# Patient Record
Sex: Male | Born: 1935 | Race: White | Hispanic: No | Smoking: Never smoker
Health system: Southern US, Community
[De-identification: ages and names within clinical notes are randomized; demographics above are authoritative.]

## PROBLEM LIST (undated history)

## (undated) DIAGNOSIS — F419 Anxiety disorder, unspecified: Secondary | ICD-10-CM

## (undated) DIAGNOSIS — F429 Obsessive-compulsive disorder, unspecified: Secondary | ICD-10-CM

## (undated) DIAGNOSIS — K5730 Diverticulosis of large intestine without perforation or abscess without bleeding: Secondary | ICD-10-CM

## (undated) DIAGNOSIS — R29898 Other symptoms and signs involving the musculoskeletal system: Secondary | ICD-10-CM

## (undated) DIAGNOSIS — K219 Gastro-esophageal reflux disease without esophagitis: Secondary | ICD-10-CM

## (undated) DIAGNOSIS — K449 Diaphragmatic hernia without obstruction or gangrene: Secondary | ICD-10-CM

## (undated) DIAGNOSIS — K635 Polyp of colon: Secondary | ICD-10-CM

## (undated) DIAGNOSIS — K222 Esophageal obstruction: Secondary | ICD-10-CM

## (undated) HISTORY — DX: Anxiety disorder, unspecified: F41.9

## (undated) HISTORY — DX: Gastro-esophageal reflux disease without esophagitis: K21.9

## (undated) HISTORY — DX: Esophageal obstruction: K22.2

## (undated) HISTORY — DX: Polyp of colon: K63.5

## (undated) HISTORY — DX: Diverticulosis of large intestine without perforation or abscess without bleeding: K57.30

## (undated) HISTORY — PX: COLONOSCOPY: SHX174

## (undated) HISTORY — DX: Other symptoms and signs involving the musculoskeletal system: R29.898

## (undated) HISTORY — PX: HEMORRHOID SURGERY: SHX153

## (undated) HISTORY — DX: Obsessive-compulsive disorder, unspecified: F42.9

## (undated) HISTORY — DX: Diaphragmatic hernia without obstruction or gangrene: K44.9

---

## 2003-03-19 DIAGNOSIS — K449 Diaphragmatic hernia without obstruction or gangrene: Secondary | ICD-10-CM | POA: Insufficient documentation

## 2004-12-05 ENCOUNTER — Ambulatory Visit: Payer: Self-pay | Admitting: Gastroenterology

## 2004-12-12 ENCOUNTER — Ambulatory Visit (HOSPITAL_COMMUNITY): Admission: RE | Admit: 2004-12-12 | Discharge: 2004-12-12 | Payer: Self-pay | Admitting: Gastroenterology

## 2004-12-28 ENCOUNTER — Ambulatory Visit: Payer: Self-pay | Admitting: Gastroenterology

## 2007-05-06 ENCOUNTER — Ambulatory Visit: Payer: Self-pay | Admitting: Gastroenterology

## 2007-05-06 LAB — CONVERTED CEMR LAB
Basophils Absolute: 0 10*3/uL (ref 0.0–0.1)
Basophils Relative: 0.6 % (ref 0.0–1.0)
Eosinophils Absolute: 0.3 10*3/uL (ref 0.0–0.6)
Eosinophils Relative: 5.9 % — ABNORMAL HIGH (ref 0.0–5.0)
Ferritin: 281.6 ng/mL (ref 22.0–322.0)
Folate: 11.6 ng/mL
HCT: 42.8 % (ref 39.0–52.0)
Hemoglobin: 14.9 g/dL (ref 13.0–17.0)
Iron: 85 ug/dL (ref 42–165)
Lymphocytes Relative: 36.6 % (ref 12.0–46.0)
MCHC: 34.9 g/dL (ref 30.0–36.0)
MCV: 95.1 fL (ref 78.0–100.0)
Monocytes Absolute: 0.6 10*3/uL (ref 0.2–0.7)
Monocytes Relative: 11 % (ref 3.0–11.0)
Neutro Abs: 2.7 10*3/uL (ref 1.4–7.7)
Neutrophils Relative %: 45.9 % (ref 43.0–77.0)
Platelets: 193 10*3/uL (ref 150–400)
RBC: 4.5 M/uL (ref 4.22–5.81)
RDW: 12.1 % (ref 11.5–14.6)
Saturation Ratios: 31.9 % (ref 20.0–50.0)
Transferrin: 190.4 mg/dL — ABNORMAL LOW (ref >=212.0)
Vitamin B-12: 429 pg/mL (ref 211–911)
WBC: 5.7 10*3/uL (ref 4.5–10.5)

## 2007-05-14 ENCOUNTER — Ambulatory Visit: Payer: Self-pay | Admitting: Gastroenterology

## 2007-05-14 DIAGNOSIS — K5730 Diverticulosis of large intestine without perforation or abscess without bleeding: Secondary | ICD-10-CM | POA: Insufficient documentation

## 2008-11-16 ENCOUNTER — Ambulatory Visit (HOSPITAL_COMMUNITY): Admission: RE | Admit: 2008-11-16 | Discharge: 2008-11-16 | Payer: Self-pay | Admitting: Sports Medicine

## 2010-10-01 ENCOUNTER — Encounter (HOSPITAL_BASED_OUTPATIENT_CLINIC_OR_DEPARTMENT_OTHER): Payer: Self-pay | Admitting: Internal Medicine

## 2010-10-01 ENCOUNTER — Encounter: Payer: Self-pay | Admitting: Sports Medicine

## 2010-10-12 NOTE — Procedures (Signed)
Summary: Gastroenterology COLON  Gastroenterology COLON   Imported By: Lowry Ram CMA 12/11/2007 15:19:27  _____________________________________________________________________  External Attachment:    Type:   Image     Comment:   External Document

## 2010-11-20 ENCOUNTER — Emergency Department (HOSPITAL_COMMUNITY)
Admission: EM | Admit: 2010-11-20 | Discharge: 2010-11-20 | Disposition: A | Discharge status: Home / Self Care | Payer: Medicare Other | Attending: Emergency Medicine | Admitting: Emergency Medicine

## 2010-11-20 DIAGNOSIS — F429 Obsessive-compulsive disorder, unspecified: Secondary | ICD-10-CM | POA: Insufficient documentation

## 2010-11-20 DIAGNOSIS — R112 Nausea with vomiting, unspecified: Secondary | ICD-10-CM | POA: Insufficient documentation

## 2010-11-20 DIAGNOSIS — R197 Diarrhea, unspecified: Secondary | ICD-10-CM | POA: Insufficient documentation

## 2010-11-20 DIAGNOSIS — K5289 Other specified noninfective gastroenteritis and colitis: Secondary | ICD-10-CM | POA: Insufficient documentation

## 2010-11-20 DIAGNOSIS — F411 Generalized anxiety disorder: Secondary | ICD-10-CM | POA: Insufficient documentation

## 2010-11-20 LAB — DIFFERENTIAL
Basophils Absolute: 0 10*3/uL (ref 0.0–0.1)
Basophils Relative: 1 % (ref 0–1)
Eosinophils Absolute: 0.3 10*3/uL (ref 0.0–0.7)
Eosinophils Relative: 6 % — ABNORMAL HIGH (ref 0–5)
Lymphocytes Relative: 25 % (ref 12–46)
Lymphs Abs: 1.3 10*3/uL (ref 0.7–4.0)
Monocytes Absolute: 1 10*3/uL (ref 0.1–1.0)
Monocytes Relative: 20 % — ABNORMAL HIGH (ref 3–12)
Neutro Abs: 2.5 10*3/uL (ref 1.7–7.7)
Neutrophils Relative %: 49 % (ref 43–77)

## 2010-11-20 LAB — COMPREHENSIVE METABOLIC PANEL
ALT: 26 U/L (ref 0–53)
AST: 26 U/L (ref 0–37)
Albumin: 3.5 g/dL (ref 3.5–5.2)
Alkaline Phosphatase: 59 U/L (ref 39–117)
BUN: 4 mg/dL — ABNORMAL LOW (ref 6–23)
CO2: 23 mEq/L (ref 19–32)
Calcium: 8.8 mg/dL (ref 8.4–10.5)
Chloride: 103 mEq/L (ref 96–112)
Creatinine, Ser: 0.87 mg/dL (ref 0.4–1.5)
GFR calc Af Amer: >60 mL/min (ref >60)
GFR calc non Af Amer: >60 mL/min (ref >60)
Glucose, Bld: 103 mg/dL — ABNORMAL HIGH (ref 70–99)
Potassium: 3.1 mEq/L — ABNORMAL LOW (ref 3.5–5.1)
Sodium: 133 mEq/L — ABNORMAL LOW (ref 135–145)
Total Bilirubin: 0.7 mg/dL (ref 0.3–1.2)
Total Protein: 6.8 g/dL (ref 6.0–8.3)

## 2010-11-20 LAB — CBC
HCT: 41.9 % (ref 39.0–52.0)
Hemoglobin: 14.5 g/dL (ref 13.0–17.0)
MCH: 32.7 pg (ref 26.0–34.0)
MCHC: 34.6 g/dL (ref 30.0–36.0)
MCV: 94.6 fL (ref 78.0–100.0)
Platelets: 125 10*3/uL — ABNORMAL LOW (ref 150–400)
RBC: 4.43 MIL/uL (ref 4.22–5.81)
RDW: 12.8 % (ref 11.5–15.5)
WBC: 5.1 10*3/uL (ref 4.0–10.5)

## 2010-12-04 ENCOUNTER — Encounter (INDEPENDENT_AMBULATORY_CARE_PROVIDER_SITE_OTHER): Payer: Self-pay | Admitting: *Deleted

## 2010-12-12 NOTE — Letter (Signed)
Summary: New Patient letter  [facility]astroenterology  [address] [place], [place] [address]   Phone: [phone]  Fax: [phone]       12/04/2010 MRN: [id number]  [facility] [address] [place], [place]  [address]  Dear Mr. Cashion,  Welcome to the Gastroenterology Division at [facility].    You are scheduled to see Dr.  Jarold Motto on 01-18-11 at 10:00A.M. on the 3rd floor at [facility], [address]  We ask that you try to arrive at ouroffice 15 minutes prior to your appointment time to allow for check-in.  We would like you to complete the enclosed self-administered evaluation form prior to your visit and bring it with you on the day of your appointment.  We will review it with you.  Also, please bring a complete list of all your medications or, if you prefer, bring the medication bottles and we will list them.  Please bring your insurance card so that we may make a copy of it.  If your insurance requires a referral to see a specialist, please bring your referral form from your primary care physician.  Co-payments are due at the time of your visit and may be paid by cash, check or credit card.     Your office visit will consist of a consult with your physician (includes a physical exam), any laboratory testing he/she may order, scheduling of any necessary diagnostic testing (e.g. x-ray, ultrasound, CT-scan), and scheduling of a procedure (e.g. Endoscopy, Colonoscopy) if required.  Please allow enough time on your schedule to allow for any/all of these possibilities.    If you cannot keep your appointment, please call [phone] to cancel or reschedule prior to your appointment date.  This allows [place] the opportunity to schedule an appointment for another patient in need of care.  If you do not cancel or reschedule by 5 p.m. the business day prior to your appointment date, you will be charged a $50.00 late cancellation/no-show fee.    Thank you for choosing   Gastroenterology for your medical needs.  We appreciate the opportunity to care for you.  Please visit [place] at our website  to learn more about our practice.                     Sincerely,                                                             The Gastroenterology Division

## 2010-12-21 ENCOUNTER — Telehealth: Payer: Self-pay | Admitting: Gastroenterology

## 2010-12-21 NOTE — Telephone Encounter (Signed)
Spoke with pt and instructed him to mix a capful of Miralax in 8oz of water twice a day until his BMs become regular, then take a dose at bedtime. Pt stated he had no problem having a BM, he just had to strain really hard to have one and he was afraid of getting a hernia. Pt will stop the Metamucil and begin the BID Miralax .

## 2010-12-21 NOTE — Telephone Encounter (Signed)
MiraLax 8 ounces twice a day until bowel movement then each bedtime.

## 2010-12-21 NOTE — Telephone Encounter (Signed)
lmom for pt to call back

## 2010-12-21 NOTE — Telephone Encounter (Signed)
Patient calling to report a change in bowel movements for the last month. Alternates between "severe constipation" and normal bowel movements. Patient reports he is using Metamucil BID, eats Fiber One every morning, has good fluid intake and eats fruit multiple times a day but is still having severe constipation off and on. States that yesterday, he had slight LLQ pain but no pain today. Last colon- 9/3/8- severe pancolonic divericulosis. Patient has OV on 01/18/11 but he is concerned about waiting until then since he is now having some pain. Please, advise.

## 2011-01-18 ENCOUNTER — Ambulatory Visit (INDEPENDENT_AMBULATORY_CARE_PROVIDER_SITE_OTHER): Payer: Medicare Other | Admitting: Gastroenterology

## 2011-01-18 ENCOUNTER — Other Ambulatory Visit (INDEPENDENT_AMBULATORY_CARE_PROVIDER_SITE_OTHER): Payer: Medicare Other

## 2011-01-18 ENCOUNTER — Encounter: Payer: Self-pay | Admitting: Gastroenterology

## 2011-01-18 DIAGNOSIS — D500 Iron deficiency anemia secondary to blood loss (chronic): Secondary | ICD-10-CM

## 2011-01-18 DIAGNOSIS — R6889 Other general symptoms and signs: Secondary | ICD-10-CM

## 2011-01-18 DIAGNOSIS — R198 Other specified symptoms and signs involving the digestive system and abdomen: Secondary | ICD-10-CM

## 2011-01-18 DIAGNOSIS — K5900 Constipation, unspecified: Secondary | ICD-10-CM | POA: Insufficient documentation

## 2011-01-18 DIAGNOSIS — R1312 Dysphagia, oropharyngeal phase: Secondary | ICD-10-CM

## 2011-01-18 LAB — FERRITIN: Ferritin: 279.3 ng/mL (ref 22.0–322.0)

## 2011-01-18 LAB — IGA: IgA: 357 mg/dL (ref 68–378)

## 2011-01-18 LAB — IBC PANEL
Iron: 137 ug/dL (ref 42–165)
Saturation Ratios: 45.5 % (ref 20.0–50.0)
Transferrin: 214.9 mg/dL (ref 212.0–360.0)

## 2011-01-18 LAB — VITAMIN B12: Vitamin B-12: 459 pg/mL (ref 211–911)

## 2011-01-18 LAB — FOLATE: Folate: 18.9 ng/mL (ref >5.9)

## 2011-01-18 NOTE — Patient Instructions (Signed)
Please go to the basement today for your labs.  Call back to schedule your Colonoscopy, at [phone]. You will need a pre visit this is a free visit with the nurse.

## 2011-01-18 NOTE — Progress Notes (Signed)
History of Present Illness:  This is a previous long-term patient of mine who is an 77 year old occasional male with chronic diverticulosis and previous episode of GI bleeding. I also have reviewed him and seeing him in the past for dysphagia felt secondary to cervical spondylosis compressing the upper esophagus. He continues with throat clearing and some difficulty swallowing and his oropharyngeal area but denies true dysphagia or any acid reflux symptoms. His main problem today is worsening constipation despite Metamucil and MiraLax use. He denies rectal bleeding or melena. There is no real abdominal pain, anorexia or weight loss. He is due for followup colonoscopy at this time. The patient has a very detailed listed list of his symptoms, medications, and medical problems that was reviewed. Additional problems include chronic anxiety, gas and bloating but no specific hepatobiliary or symptomatic systemic complaints.  I have reviewed this patient's present history, medical and surgical past history, allergies and medications.     ROS: The remainder of the 10 point ROS is negative   Past Medical History  Diagnosis Date  . Hiatal hernia   . Diverticulosis of colon (without mention of hemorrhage)   . Anxiety   . OCD (obsessive compulsive disorder)   . Viral hepatitis 08/1983   Past Surgical History  Procedure Date  . Hernia repair 1965    reports that he has never smoked. He has never used smokeless tobacco. He reports that he drinks about 3.5 ounces of alcohol per week. He reports that he does not use illicit drugs. family history includes Colon cancer in his other and Diverticulosis in his mother. No Known Allergies     Physical Exam: General well developed well nourished patient in no acute distress, appearing their stated age Eyes PERRLA, no icterus, fundoscopic exam per opthamologist Skin no lesions noted Neck supple, no adenopathy, no thyroid enlargement, no tenderness Chest clear  to percussion and auscultation Heart no significant murmurs, gallops or rubs noted Abdomen no hepatosplenomegaly masses or tenderness, BS normal. . Extremities no acute joint lesions, edema, phlebitis or evidence of cellulitis. Neurologic patient oriented x 3, cranial nerves intact, no focal neurologic deficits noted. Psychological mental status normal and normal affect.  Assessment and plan: Worsening chronic constipation of unexplained etiology number rule out colon carcinoma. I have scheduled colonoscopy at his convenience and will obtain U. MiraLax 8 ounces each bedtime. Screening labs ordered. We discussed at length his cervical spine disease and his dysphagia, and decided not to proceed with any further diagnostic evaluations at this time. He is to continue his other medications as per primary care.  Encounter Diagnoses  Name Primary?  . Constipation   . Change in bowel function   . Iron deficiency anemia secondary to blood loss (chronic)   . General symptom

## 2011-01-19 ENCOUNTER — Other Ambulatory Visit: Payer: Medicare Other

## 2011-01-19 ENCOUNTER — Telehealth: Payer: Self-pay | Admitting: *Deleted

## 2011-01-19 LAB — GLIA (IGA/G) + TTG IGA
Gliadin IgA: 24.1 U/mL — ABNORMAL HIGH (ref <20)
Gliadin IgG: 8.3 U/mL (ref <20)
Tissue Transglutaminase Ab, IgA: 12.9 U/mL (ref <20)

## 2011-01-19 NOTE — Telephone Encounter (Signed)
Message copied by Harlow Mares on Fri Jan 19, 2011  9:34 AM ------      Message from: Jarold Motto, DAVID      Created: Fri Jan 19, 2011  8:35 AM       Stop multivitamins if they have iron supplements. He also needs hemochromatosis genetic studies.

## 2011-01-19 NOTE — Telephone Encounter (Signed)
Pt aware and will come for labs today or Monday.

## 2011-01-23 NOTE — Assessment & Plan Note (Signed)
[facility]                         GASTROENTEROLOGY OFFICE NOTE   NAME:DAVISRomolo, Adam Swanson                      MRN:          [id number]  DATE:05/06/2007                            DOB:          1928-05-02    Adam Swanson is an elderly white male who I have seen in the past for  diverticulosis.  He has had some asymptomatic bright red blood per  rectum for the last 5 days without abdominal pain or any symptoms of  hypovolemia.  He has never had previous GI hemorrhage.  He denies any  upper GI complaints whatsoever.  He has had previous negative  endoscopies and colonoscopies performed in our office.  Last colonoscopy  exam was several years ago.  Most of his problems have been with  recurrent swallowing abnormalities.   The patient denies abuse of NSAIDs or salicylates, alcohol, or other  medicine not listed in his chart.  He does take aspirin 81 mg a day  along with lorazepam 1 mg t.i.d.   PHYSICAL EXAMINATION:  GENERAL:  He is a healthy-appearing elderly white  male in no acute distress.  VITAL SIGNS:  He weighs 144 pounds.  Blood pressure 110/58, pulse 66 and  regular.  ABDOMEN:  Exam was entirely normal, without organomegaly, masses, or  tenderness.  Bowel sounds were normal.  RECTAL:  Inspection of the rectum did show some external hemorrhoidal  tissue.  No fissures of fistulae.  Rectal exam showed no masses or  tenderness, with soft stool present to my exam that was guaiac-negative.   ASSESSMENT:  It certainly sounds like Adam Swanson has had a diverticular  hemorrhage which is resolving.  He has no evidence of significant blood  loss at this time.  He is going out of town today, and I have cautioned  him about symptomatology requiring emergency room referral.  I have  given him a note to carry with him in case he should have any further  significant bleeding, although I think this is unlikely.   RECOMMENDATIONS:  1. Stop aspirin therapy.  2.  Low-fiber diet as tolerated.  3. Check CBC and iron levels today.  4. Outpatient colonoscopy as soon as possible.     Vania Rea. Jarold Motto, MD, Caleen Essex, FAGA  Electronically Signed    DRP/MedQ  DD: 05/07/2007  DT: 05/08/2007  Job #: [id number]

## 2011-01-25 LAB — HEMOCHROMATOSIS DNA-PCR(C282Y,H63D)

## 2011-02-12 ENCOUNTER — Telehealth: Payer: Self-pay | Admitting: *Deleted

## 2011-02-12 NOTE — Telephone Encounter (Signed)
Left message on answering machine.  Sent NOS letter.  Canceled procedure.

## 2011-02-13 ENCOUNTER — Telehealth: Payer: Self-pay | Admitting: Gastroenterology

## 2011-02-13 NOTE — Telephone Encounter (Signed)
Pt called to let Dr. Jarold Motto know that he cancelled his procedure because his wife is dying, he wanted to let Dr. Jarold Motto know.

## 2011-02-26 ENCOUNTER — Encounter: Payer: Self-pay | Admitting: Gastroenterology

## 2011-02-26 ENCOUNTER — Other Ambulatory Visit: Payer: Medicare Other | Admitting: Gastroenterology

## 2011-03-26 ENCOUNTER — Ambulatory Visit (AMBULATORY_SURGERY_CENTER): Payer: Medicare Other | Admitting: *Deleted

## 2011-03-26 DIAGNOSIS — D500 Iron deficiency anemia secondary to blood loss (chronic): Secondary | ICD-10-CM

## 2011-03-26 DIAGNOSIS — K5900 Constipation, unspecified: Secondary | ICD-10-CM

## 2011-03-26 MED ORDER — PEG-KCL-NACL-NASULF-NA ASC-C 100 G PO SOLR: 1.0000 | Freq: Once | ORAL | Status: DC

## 2011-03-26 NOTE — Progress Notes (Signed)
Pt recently lost his wife-June 9th.  Very tearful and weepy during PV.

## 2011-04-09 ENCOUNTER — Encounter: Payer: Self-pay | Admitting: Gastroenterology

## 2011-04-09 ENCOUNTER — Ambulatory Visit (AMBULATORY_SURGERY_CENTER): Payer: Medicare Other | Admitting: Gastroenterology

## 2011-04-09 DIAGNOSIS — D129 Benign neoplasm of anus and anal canal: Secondary | ICD-10-CM

## 2011-04-09 DIAGNOSIS — D500 Iron deficiency anemia secondary to blood loss (chronic): Secondary | ICD-10-CM

## 2011-04-09 DIAGNOSIS — K5900 Constipation, unspecified: Secondary | ICD-10-CM

## 2011-04-09 DIAGNOSIS — K5730 Diverticulosis of large intestine without perforation or abscess without bleeding: Secondary | ICD-10-CM

## 2011-04-09 DIAGNOSIS — D126 Benign neoplasm of colon, unspecified: Secondary | ICD-10-CM

## 2011-04-09 DIAGNOSIS — D128 Benign neoplasm of rectum: Secondary | ICD-10-CM

## 2011-04-09 MED ORDER — SODIUM CHLORIDE 0.9 % IV SOLN: 500.0000 mL | INTRAVENOUS | Status: DC

## 2011-04-09 NOTE — Patient Instructions (Signed)
[employer] reviewed with patient and care partner.  [facility] Discharge Instructions signed by care partner.  Impressions/Recommendations:  Diverticulosis (handout given as well as High Fiber Diet handout given) Polyp  Metamucil or Benefiber Miralax at bedtime  Resume medications as you were taking them prior to coming for your procedure.

## 2011-04-10 ENCOUNTER — Telehealth: Payer: Self-pay

## 2011-04-10 NOTE — Telephone Encounter (Signed)
No ID on answering machine.  No message left. 

## 2011-04-11 ENCOUNTER — Telehealth: Payer: Self-pay | Admitting: Gastroenterology

## 2011-04-11 NOTE — Telephone Encounter (Signed)
Pt stated on his instruction sheet for COLON 04/09/11, it stated someone would call him back. Pt reports he left his son's number for the staff because he spent the night at his son's house. No one ever called him or left a message. Explained to pt the phone note stated a nurse from ENDO tried to call him, but the answering machine did not identify a name, so no message was left; no one called the son either. Pt would like for a supervisor to be aware of the problem. Informed pt I will inform Dixie Doss and Dellia Beckwith.

## 2011-04-13 ENCOUNTER — Encounter: Payer: Self-pay | Admitting: Gastroenterology

## 2011-04-13 ENCOUNTER — Telehealth: Payer: Self-pay | Admitting: *Deleted

## 2011-04-23 ENCOUNTER — Telehealth: Payer: Self-pay | Admitting: *Deleted

## 2011-04-23 NOTE — Telephone Encounter (Signed)
Resolved with patient.

## 2011-04-23 NOTE — Telephone Encounter (Signed)
Patient verbally stated he was satisfied.

## 2011-10-02 DIAGNOSIS — H43819 Vitreous degeneration, unspecified eye: Secondary | ICD-10-CM | POA: Diagnosis not present

## 2011-10-02 DIAGNOSIS — H35369 Drusen (degenerative) of macula, unspecified eye: Secondary | ICD-10-CM | POA: Diagnosis not present

## 2011-10-02 DIAGNOSIS — D3130 Benign neoplasm of unspecified choroid: Secondary | ICD-10-CM | POA: Diagnosis not present

## 2011-10-08 DIAGNOSIS — T6391XA Toxic effect of contact with unspecified venomous animal, accidental (unintentional), initial encounter: Secondary | ICD-10-CM | POA: Diagnosis not present

## 2011-11-05 DIAGNOSIS — Z961 Presence of intraocular lens: Secondary | ICD-10-CM | POA: Diagnosis not present

## 2011-11-05 DIAGNOSIS — D3130 Benign neoplasm of unspecified choroid: Secondary | ICD-10-CM | POA: Diagnosis not present

## 2011-11-05 DIAGNOSIS — H353190 Nonexudative age-related macular degeneration, unspecified eye, stage unspecified: Secondary | ICD-10-CM | POA: Diagnosis not present

## 2011-11-05 DIAGNOSIS — H35369 Drusen (degenerative) of macula, unspecified eye: Secondary | ICD-10-CM | POA: Diagnosis not present

## 2011-11-05 DIAGNOSIS — T6391XA Toxic effect of contact with unspecified venomous animal, accidental (unintentional), initial encounter: Secondary | ICD-10-CM | POA: Diagnosis not present

## 2011-11-07 DIAGNOSIS — F429 Obsessive-compulsive disorder, unspecified: Secondary | ICD-10-CM | POA: Diagnosis not present

## 2011-11-22 DIAGNOSIS — L821 Other seborrheic keratosis: Secondary | ICD-10-CM | POA: Diagnosis not present

## 2011-11-22 DIAGNOSIS — L570 Actinic keratosis: Secondary | ICD-10-CM | POA: Diagnosis not present

## 2011-11-22 DIAGNOSIS — Z85828 Personal history of other malignant neoplasm of skin: Secondary | ICD-10-CM | POA: Diagnosis not present

## 2011-12-03 DIAGNOSIS — T6391XA Toxic effect of contact with unspecified venomous animal, accidental (unintentional), initial encounter: Secondary | ICD-10-CM | POA: Diagnosis not present

## 2011-12-31 DIAGNOSIS — T6391XA Toxic effect of contact with unspecified venomous animal, accidental (unintentional), initial encounter: Secondary | ICD-10-CM | POA: Diagnosis not present

## 2012-01-28 DIAGNOSIS — T6391XA Toxic effect of contact with unspecified venomous animal, accidental (unintentional), initial encounter: Secondary | ICD-10-CM | POA: Diagnosis not present

## 2012-02-25 DIAGNOSIS — T6391XA Toxic effect of contact with unspecified venomous animal, accidental (unintentional), initial encounter: Secondary | ICD-10-CM | POA: Diagnosis not present

## 2012-03-24 DIAGNOSIS — T6391XA Toxic effect of contact with unspecified venomous animal, accidental (unintentional), initial encounter: Secondary | ICD-10-CM | POA: Diagnosis not present

## 2012-04-01 DIAGNOSIS — H35369 Drusen (degenerative) of macula, unspecified eye: Secondary | ICD-10-CM | POA: Diagnosis not present

## 2012-04-01 DIAGNOSIS — H353190 Nonexudative age-related macular degeneration, unspecified eye, stage unspecified: Secondary | ICD-10-CM | POA: Diagnosis not present

## 2012-04-01 DIAGNOSIS — D3130 Benign neoplasm of unspecified choroid: Secondary | ICD-10-CM | POA: Diagnosis not present

## 2012-04-08 DIAGNOSIS — L608 Other nail disorders: Secondary | ICD-10-CM | POA: Diagnosis not present

## 2012-04-08 DIAGNOSIS — B359 Dermatophytosis, unspecified: Secondary | ICD-10-CM | POA: Diagnosis not present

## 2012-04-08 DIAGNOSIS — L821 Other seborrheic keratosis: Secondary | ICD-10-CM | POA: Diagnosis not present

## 2012-04-14 DIAGNOSIS — T6391XA Toxic effect of contact with unspecified venomous animal, accidental (unintentional), initial encounter: Secondary | ICD-10-CM | POA: Diagnosis not present

## 2012-05-13 DIAGNOSIS — T6391XA Toxic effect of contact with unspecified venomous animal, accidental (unintentional), initial encounter: Secondary | ICD-10-CM | POA: Diagnosis not present

## 2012-05-26 DIAGNOSIS — D692 Other nonthrombocytopenic purpura: Secondary | ICD-10-CM | POA: Diagnosis not present

## 2012-05-26 DIAGNOSIS — L821 Other seborrheic keratosis: Secondary | ICD-10-CM | POA: Diagnosis not present

## 2012-05-28 DIAGNOSIS — F429 Obsessive-compulsive disorder, unspecified: Secondary | ICD-10-CM | POA: Diagnosis not present

## 2012-05-28 DIAGNOSIS — Z23 Encounter for immunization: Secondary | ICD-10-CM | POA: Diagnosis not present

## 2012-05-28 DIAGNOSIS — N402 Nodular prostate without lower urinary tract symptoms: Secondary | ICD-10-CM | POA: Diagnosis not present

## 2012-05-28 DIAGNOSIS — E782 Mixed hyperlipidemia: Secondary | ICD-10-CM | POA: Diagnosis not present

## 2012-05-28 DIAGNOSIS — J309 Allergic rhinitis, unspecified: Secondary | ICD-10-CM | POA: Diagnosis not present

## 2012-05-28 DIAGNOSIS — Z0000 Encounter for general adult medical examination without abnormal findings: Secondary | ICD-10-CM | POA: Diagnosis not present

## 2012-05-28 DIAGNOSIS — K219 Gastro-esophageal reflux disease without esophagitis: Secondary | ICD-10-CM | POA: Diagnosis not present

## 2012-05-28 DIAGNOSIS — Z79899 Other long term (current) drug therapy: Secondary | ICD-10-CM | POA: Diagnosis not present

## 2012-06-05 DIAGNOSIS — N138 Other obstructive and reflux uropathy: Secondary | ICD-10-CM | POA: Diagnosis not present

## 2012-06-10 DIAGNOSIS — T6391XA Toxic effect of contact with unspecified venomous animal, accidental (unintentional), initial encounter: Secondary | ICD-10-CM | POA: Diagnosis not present

## 2012-06-12 DIAGNOSIS — B351 Tinea unguium: Secondary | ICD-10-CM | POA: Diagnosis not present

## 2012-06-12 DIAGNOSIS — IMO0002 Reserved for concepts with insufficient information to code with codable children: Secondary | ICD-10-CM | POA: Diagnosis not present

## 2012-07-08 DIAGNOSIS — T6391XA Toxic effect of contact with unspecified venomous animal, accidental (unintentional), initial encounter: Secondary | ICD-10-CM | POA: Diagnosis not present

## 2012-07-21 DIAGNOSIS — F429 Obsessive-compulsive disorder, unspecified: Secondary | ICD-10-CM | POA: Diagnosis not present

## 2012-07-29 DIAGNOSIS — Z1211 Encounter for screening for malignant neoplasm of colon: Secondary | ICD-10-CM | POA: Diagnosis not present

## 2012-08-01 DIAGNOSIS — T6391XA Toxic effect of contact with unspecified venomous animal, accidental (unintentional), initial encounter: Secondary | ICD-10-CM | POA: Diagnosis not present

## 2012-08-04 DIAGNOSIS — T6391XA Toxic effect of contact with unspecified venomous animal, accidental (unintentional), initial encounter: Secondary | ICD-10-CM | POA: Diagnosis not present

## 2012-08-06 DIAGNOSIS — T6391XA Toxic effect of contact with unspecified venomous animal, accidental (unintentional), initial encounter: Secondary | ICD-10-CM | POA: Diagnosis not present

## 2012-09-05 DIAGNOSIS — T6391XA Toxic effect of contact with unspecified venomous animal, accidental (unintentional), initial encounter: Secondary | ICD-10-CM | POA: Diagnosis not present

## 2012-09-18 DIAGNOSIS — F429 Obsessive-compulsive disorder, unspecified: Secondary | ICD-10-CM | POA: Diagnosis not present

## 2012-10-03 DIAGNOSIS — T6391XA Toxic effect of contact with unspecified venomous animal, accidental (unintentional), initial encounter: Secondary | ICD-10-CM | POA: Diagnosis not present

## 2012-10-20 ENCOUNTER — Telehealth: Payer: Self-pay | Admitting: Gastroenterology

## 2012-10-20 NOTE — Telephone Encounter (Signed)
Pt reports he has trouble swallowing at times. It happens while he's eating, he starts coughing then he sneezes x 6 and the coughing is over. Last EGD was 03/19/2003 showing HH ; he was given reflux instructions and ordered Aciphex which he states he never took. Pt given an appt on 10/28/12.

## 2012-10-21 ENCOUNTER — Encounter: Payer: Self-pay | Admitting: *Deleted

## 2012-10-28 ENCOUNTER — Ambulatory Visit (INDEPENDENT_AMBULATORY_CARE_PROVIDER_SITE_OTHER): Payer: Medicare Other | Admitting: Gastroenterology

## 2012-10-28 ENCOUNTER — Encounter: Payer: Self-pay | Admitting: Gastroenterology

## 2012-10-28 ENCOUNTER — Telehealth: Payer: Self-pay | Admitting: Gastroenterology

## 2012-10-28 VITALS — BP 110/80 | HR 72 | Ht 66.5 in | Wt 144.0 lb

## 2012-10-28 DIAGNOSIS — K5900 Constipation, unspecified: Secondary | ICD-10-CM | POA: Diagnosis not present

## 2012-10-28 DIAGNOSIS — K5730 Diverticulosis of large intestine without perforation or abscess without bleeding: Secondary | ICD-10-CM

## 2012-10-28 DIAGNOSIS — R1310 Dysphagia, unspecified: Secondary | ICD-10-CM

## 2012-10-28 NOTE — Telephone Encounter (Signed)
Pt reports he got very upset when he was here today and could not make a decision about scheduling his tests; EGD and EM. He asked if I could sit and talk with him and he will go over his schedule and we can arrange the procedures. Pt will come in 11/04/12 at 10am to see me.

## 2012-10-28 NOTE — Progress Notes (Signed)
History of Present Illness:  This is a pleasant 79 year old Caucasian male who has had more than 10 years of nonspecific dysphagia and throat clearance problems.  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  Last endoscopic exam was 10 years ago.  He continues with some throat clearing and occasional severe coughing, but denies true dysphagia, acid reflux symptoms, anorexia or weight loss.  He is up-to-date on his colonoscopy exam.  He denies lower GI or hepatobiliary complaints.  His appetite is good and his weight is stable.  He has had problems may limit the death of his wife.  However, he denies any severe psychiatric problems.  He has moved into a long-term healthcare facility.  I have reviewed this patient's present history, medical and surgical past history, allergies and medications.     ROS:   All systems were reviewed and are negative unless otherwise stated in the HPI.... mild constipation with occasional MiraLax use.  There is no history of melena, hematochezia, recurrent diverticulitis.    Physical Exam: Blood pressure 110/80, pulse 72 and regular, and weight 144 with a BMI of 22.9. General well developed well nourished patient in no acute distress, appearing younger than his stated age Eyes PERRLA, no icterus, fundoscopic exam per opthamologist Skin no lesions noted... examination of the oral pharyngeal area is unremarkable. Neck supple, no adenopathy, no thyroid enlargement, no tenderness Chest clear to percussion and auscultation Heart no significant murmurs, gallops or rubs noted Abdomen no hepatosplenomegaly masses or tenderness, BS normal. e. Extremities no acute joint lesions, edema, phlebitis or evidence of cellulitis. Neurologic patient oriented x 3, cranial nerves intact, no focal neurologic deficits noted. Psychological mental status normal and normal affect.  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  Has been 10 years since he is been evaluated with endoscopic exam, and he is requested followup exam which is probably appropriate.  Also we will check esophageal manometry which is never been performed.  He may need ENT referral.  I've advised him to continue with a high fiber diet and liberal by mouth fluids for his diverticulosis and mild constipation..  I suspect his problems are related to cervical osteophytes and cricopharyngeal dysfunction.  He has not had any history of CVAs, peripheral vascular disease, or other neurological diagnoses.  Encounter Diagnosis  Name Primary?  Marland Kitchen Dysphagia, unspecified Yes

## 2012-10-28 NOTE — Patient Instructions (Addendum)
It has been recommended to you by your physician that you have a(n) Endoscopy completed. Per your request, we did not schedule the procedure(s) today. Please contact our office at [phone] should you decide to have the procedure completed.   Patient will call back and talk to Duke Salvia, RN to schedule esophageal manometry.  ____________________________________________________________________________________________________________________

## 2012-11-03 DIAGNOSIS — T6391XA Toxic effect of contact with unspecified venomous animal, accidental (unintentional), initial encounter: Secondary | ICD-10-CM | POA: Diagnosis not present

## 2012-11-04 ENCOUNTER — Telehealth: Payer: Self-pay | Admitting: *Deleted

## 2012-11-04 NOTE — Telephone Encounter (Signed)
Pt and his son Jonny Ruiz came in today to schedule pt for an EGD. Pt saw Dr Jarold Motto on 10/28/12 and he was too nervous and upset to schedule a procedure at that time; we decided he would come today. Pt became very tearful during the meeting and he and son agreed on 12/22/12. Prep instructions were covered with pt and consent signed. Took pt up to the 4th floor and showed him where to check in , etc. Spent a long time with the pt and he spoke of his wife's death and how he has never gotten over it. Encouraged pt to call if he needs to talk or has questions about his prep/procedure. Pt stated understanding.

## 2012-11-05 ENCOUNTER — Encounter: Payer: Medicare Other | Admitting: Gastroenterology

## 2012-11-05 DIAGNOSIS — D3130 Benign neoplasm of unspecified choroid: Secondary | ICD-10-CM | POA: Diagnosis not present

## 2012-11-05 DIAGNOSIS — H43399 Other vitreous opacities, unspecified eye: Secondary | ICD-10-CM | POA: Diagnosis not present

## 2012-11-05 DIAGNOSIS — Z961 Presence of intraocular lens: Secondary | ICD-10-CM | POA: Diagnosis not present

## 2012-11-05 DIAGNOSIS — H353190 Nonexudative age-related macular degeneration, unspecified eye, stage unspecified: Secondary | ICD-10-CM | POA: Diagnosis not present

## 2012-11-05 DIAGNOSIS — L719 Rosacea, unspecified: Secondary | ICD-10-CM | POA: Diagnosis not present

## 2012-11-17 ENCOUNTER — Telehealth: Payer: Self-pay | Admitting: Gastroenterology

## 2012-11-17 NOTE — Telephone Encounter (Signed)
Pt wants to come talk to me; he did not say why, but I will see him tomorrow about 10am.

## 2012-11-18 ENCOUNTER — Telehealth: Payer: Self-pay | Admitting: *Deleted

## 2012-11-18 NOTE — Telephone Encounter (Signed)
Pt called yesterday and asked if he could see me today and I agreed. Pt states he's a constant worrier and is concerned about needing a transfusion after the procedure. What we mainly discussed was whether blood is safe now; free from AIDS and HIV. Explained to pt blood is screened for those diseases now, but like anything else there's a chance he may contract something. Pt stated understanding. We talked for several minutes and informed him, there is a chance of bleeding post polypectomy , but rarely occurs with EGDs. Pt stated understanding.

## 2012-11-24 ENCOUNTER — Ambulatory Visit (HOSPITAL_COMMUNITY): Admit: 2012-11-24 | Payer: Self-pay | Admitting: Gastroenterology

## 2012-11-24 ENCOUNTER — Encounter (HOSPITAL_COMMUNITY): Payer: Self-pay

## 2012-11-24 SURGERY — MANOMETRY, ESOPHAGUS

## 2012-11-26 ENCOUNTER — Encounter: Payer: Medicare Other | Admitting: Gastroenterology

## 2012-12-01 DIAGNOSIS — T6391XA Toxic effect of contact with unspecified venomous animal, accidental (unintentional), initial encounter: Secondary | ICD-10-CM | POA: Diagnosis not present

## 2012-12-22 ENCOUNTER — Telehealth: Payer: Self-pay | Admitting: Gastroenterology

## 2012-12-22 ENCOUNTER — Telehealth: Payer: Self-pay | Admitting: *Deleted

## 2012-12-22 ENCOUNTER — Ambulatory Visit (AMBULATORY_SURGERY_CENTER): Payer: Medicare Other | Admitting: Gastroenterology

## 2012-12-22 ENCOUNTER — Encounter: Payer: Self-pay | Admitting: Gastroenterology

## 2012-12-22 VITALS — BP 111/72 | HR 63 | Temp 96.3°F | Resp 15 | Ht 66.5 in | Wt 144.0 lb

## 2012-12-22 DIAGNOSIS — R1310 Dysphagia, unspecified: Secondary | ICD-10-CM | POA: Diagnosis not present

## 2012-12-22 DIAGNOSIS — R1312 Dysphagia, oropharyngeal phase: Secondary | ICD-10-CM

## 2012-12-22 DIAGNOSIS — Q393 Congenital stenosis and stricture of esophagus: Secondary | ICD-10-CM | POA: Diagnosis not present

## 2012-12-22 DIAGNOSIS — K449 Diaphragmatic hernia without obstruction or gangrene: Secondary | ICD-10-CM | POA: Diagnosis not present

## 2012-12-22 DIAGNOSIS — Q391 Atresia of esophagus with tracheo-esophageal fistula: Secondary | ICD-10-CM

## 2012-12-22 MED ORDER — NEXIUM 40 MG PO CPDR: 40.0000 mg | DELAYED_RELEASE_CAPSULE | Freq: Every day | ORAL | Status: DC

## 2012-12-22 MED ORDER — ESOMEPRAZOLE MAGNESIUM 40 MG PO CPDR: 40.0000 mg | DELAYED_RELEASE_CAPSULE | Freq: Every day | ORAL | Status: DC

## 2012-12-22 MED ORDER — SODIUM CHLORIDE 0.9 % IV SOLN: 500.0000 mL | INTRAVENOUS | Status: DC

## 2012-12-22 NOTE — Patient Instructions (Addendum)
YOU HAD AN ENDOSCOPIC PROCEDURE TODAY AT THE [facility]: Refer to the procedure report that was given to you for any specific questions about what was found during the examination.  If the procedure report does not answer your questions, please call your gastroenterologist to clarify.  If you requested that your care partner not be given the details of your procedure findings, then the procedure report has been included in a sealed envelope for you to review at your convenience later.  YOU SHOULD EXPECT: Some feelings of bloating in the abdomen. Passage of more gas than usual.  Walking can help get rid of the air that was put into your GI tract during the procedure and reduce the bloating. If you had a lower endoscopy (such as a colonoscopy or flexible sigmoidoscopy) you may notice spotting of blood in your stool or on the toilet paper. If you underwent a bowel prep for your procedure, then you may not have a normal bowel movement for a few days.  DIET:  You may have clear liquids at 11:15 am.  After that , you may have a soft diet for the rest of the day.  You may resume your regular diet tomorrow.  ACTIVITY: Your care partner should take you home directly after the procedure.  You should plan to take it easy, moving slowly for the rest of the day.  You can resume normal activity the day after the procedure however you should NOT DRIVE or use heavy machinery for 24 hours (because of the sedation medicines used during the test).    SYMPTOMS TO REPORT IMMEDIATELY: A gastroenterologist can be reached at any hour.  During normal business hours, 8:30 AM to 5:00 PM Monday through Friday, call [phone].  After hours and on weekends, please call the GI answering service at [phone] who will take a message and have the physician on call contact you.   Following upper endoscopy (EGD)  Vomiting of blood or coffee ground material  New chest pain or pain under the shoulder  blades  Painful or persistently difficult swallowing  New shortness of breath  Fever of 100F or higher  Black, tarry-looking stools  FOLLOW UP: If any biopsies were taken you will be contacted by phone or by letter within the next 1-3 weeks.  Call your gastroenterologist if you have not heard about the biopsies in 3 weeks.  Our staff will call the home number listed on your records the next business day following your procedure to check on you and address any questions or concerns that you may have at that time regarding the information given to you following your procedure. This is a courtesy call and so if there is no answer at the home number and we have not heard from you through the emergency physician on call, we will assume that you have returned to your regular daily activities without incident.  SIGNATURES/CONFIDENTIALITY: You and/or your care partner have signed paperwork which will be entered into your electronic medical record.  These signatures attest to the fact that that the information above on your After Visit Summary has been reviewed and is understood.  Full responsibility of the confidentiality of this discharge information lies with you and/or your care-partner.

## 2012-12-22 NOTE — Telephone Encounter (Signed)
Spoke with patient and his son.  Told patient and son that patient can take his mirilax today and his other meds.  Son understands, and he said that the patient was developing dementia.   Both I believe understood our conversation.

## 2012-12-22 NOTE — Op Note (Signed)
[facility] [address] [place], [address]   ENDOSCOPY PROCEDURE REPORT  PATIENT: Adam Swanson, Adam Swanson  MR#: [id number] BIRTHDATE: 1928/06/02 , 85  yrs. old GENDER: Male ENDOSCOPIST:David Hale Bogus, MD, [facility] REFERRED BY: PROCEDURE DATE:  12/22/2012 PROCEDURE:   EGD, diagnostic and Maloney dilation of esophagus ASA CLASS:    Class II INDICATIONS: Dysphagia. MEDICATION: Propofol (Diprivan) 140 mg IV TOPICAL ANESTHETIC:   Cetacaine Spray  DESCRIPTION OF PROCEDURE:   After the risks and benefits of the procedure were explained, informed consent was obtained.  The [facility] GIF-H180 [id number]  endoscope was introduced through the mouth  and advanced to the second portion of the duodenum .  The instrument was slowly withdrawn as the mucosa was fully examined.      DUODENUM: The duodenal mucosa showed no abnormalities in the bulb and second portion of the duodenum.  STOMACH: The mucosa of the stomach appeared normal.  ESOPHAGUS: The mucosa of the esophagus appeared normal. Schatzski's ring at GE junction dilated #5F and #64F [facility] dilators...no heme or pain. Large HH noted.   Retroflexed views revealed a7-8 cm. hiatal hernia.    The scope was then withdrawn from the patient and the procedure completed  COMPLICATIONS: There were no complications.   ENDOSCOPIC IMPRESSION: 1.   The duodenal mucosa showed no abnormalities in the bulb and second portion of the duodenum 2.   The mucosa of the stomach appeared normal 3.   The mucosa of the esophagus appeared normal 4.  Large Hiatial Hernia and probable esophageal stricture dilated. RECOMMENDATIONS: 1.  Continue PPI 2.  Dilatations PRN    _______________________________ eSigned:  Mardella Layman, MD, [facility] 12/22/2012 10:06 AM      PATIENT NAME:  Adam Swanson MR#: [id number]

## 2012-12-22 NOTE — Progress Notes (Signed)
Patient did not have preoperative order for IV antibiotic SSI prophylaxis. [phone])  Patient did not experience any of the following events: a burn prior to discharge; a fall within the facility; wrong site/side/patient/procedure/implant event; or a hospital transfer or hospital admission upon discharge from the facility. [phone])   Patient states that he needs a written script for the [facility] to [facility] for the nexium ordered.

## 2012-12-22 NOTE — Progress Notes (Signed)
Called to room to assist during endoscopic procedure.  Patient ID and intended procedure confirmed with present staff. Received instructions for my participation in the procedure from the performing physician.  

## 2012-12-23 ENCOUNTER — Telehealth: Payer: Self-pay | Admitting: Gastroenterology

## 2012-12-23 ENCOUNTER — Telehealth: Payer: Self-pay | Admitting: *Deleted

## 2012-12-23 MED ORDER — OMEPRAZOLE 40 MG PO CPDR: 40.0000 mg | DELAYED_RELEASE_CAPSULE | Freq: Every day | ORAL | Status: DC

## 2012-12-23 NOTE — Telephone Encounter (Signed)
I have spoken to patient's son, Adam Swanson and explained that currently, there is no generic form of Nexium available. However, I am more than happy to send an omeprazole prescription in place of Nexium due to cost. I have advised that these medications are often used interchangeably but should patient not have symptomatic relief with omeprazole, he should contact our office. Patient's son verbalizes understanding and a new prescription for omeprazole has been sent to [facility].

## 2012-12-23 NOTE — Telephone Encounter (Signed)
No answer, name identifier. Message left to call if any questions or concerns.

## 2012-12-24 ENCOUNTER — Telehealth: Payer: Self-pay | Admitting: Gastroenterology

## 2012-12-24 NOTE — Telephone Encounter (Signed)
lmom for pt to call me back

## 2012-12-25 NOTE — Telephone Encounter (Signed)
Pt would like to discuss the results of his procedure done earlier this week. He will come in on 12/29/12 to see me.

## 2012-12-29 ENCOUNTER — Telehealth: Payer: Self-pay | Admitting: *Deleted

## 2012-12-29 DIAGNOSIS — T6391XA Toxic effect of contact with unspecified venomous animal, accidental (unintentional), initial encounter: Secondary | ICD-10-CM | POA: Diagnosis not present

## 2012-12-29 MED ORDER — OMEPRAZOLE 20 MG PO CPDR: DELAYED_RELEASE_CAPSULE | ORAL | Status: DC

## 2012-12-29 MED ORDER — OMEPRAZOLE 20 MG PO CPDR: 20.0000 mg | DELAYED_RELEASE_CAPSULE | Freq: Every day | ORAL | Status: DC

## 2012-12-29 NOTE — Telephone Encounter (Signed)
Pt called last week and asked to see me to discuss results of his EGD. We discussed the EGD and Dr Norval Gable findings. Used an anatomy chart to show the different organs and sphincters in the GI system. Gave him brochures on [facility] and Sphincters; pt stated understanding. He was confused about the PPI and states he can't afford the Nexium. His Pharmacist at [facility] has suggested he take [facility] brand of omeprazole 20mg  instead of Nexium. He will take 2- 20 mg capsules of Omeprazole twice daily for 2 weeks and then decrease to 1- 20 mg capsule as maintenance. He was given Prilosec samples and discount cards, but he will shop around to find the cheapest Omeprazole until he goes to the [place] next year. I will order Omeprazole from [facility] to see how expensive it its. Gave pt a script for 20mg  omeprazole daily for the [place]

## 2013-01-04 ENCOUNTER — Emergency Department (HOSPITAL_COMMUNITY)
Admission: EM | Admit: 2013-01-04 | Discharge: 2013-01-04 | Disposition: A | Discharge status: Home / Self Care | Payer: Medicare Other | Attending: Emergency Medicine | Admitting: Emergency Medicine

## 2013-01-04 ENCOUNTER — Encounter (HOSPITAL_COMMUNITY): Payer: Self-pay | Admitting: *Deleted

## 2013-01-04 DIAGNOSIS — K219 Gastro-esophageal reflux disease without esophagitis: Secondary | ICD-10-CM | POA: Insufficient documentation

## 2013-01-04 DIAGNOSIS — S0010XA Contusion of unspecified eyelid and periocular area, initial encounter: Secondary | ICD-10-CM | POA: Diagnosis not present

## 2013-01-04 DIAGNOSIS — Z7982 Long term (current) use of aspirin: Secondary | ICD-10-CM | POA: Diagnosis not present

## 2013-01-04 DIAGNOSIS — Z79899 Other long term (current) drug therapy: Secondary | ICD-10-CM | POA: Insufficient documentation

## 2013-01-04 DIAGNOSIS — W2203XA Walked into furniture, initial encounter: Secondary | ICD-10-CM | POA: Insufficient documentation

## 2013-01-04 DIAGNOSIS — S0512XA Contusion of eyeball and orbital tissues, left eye, initial encounter: Secondary | ICD-10-CM

## 2013-01-04 DIAGNOSIS — F429 Obsessive-compulsive disorder, unspecified: Secondary | ICD-10-CM | POA: Insufficient documentation

## 2013-01-04 DIAGNOSIS — Z8719 Personal history of other diseases of the digestive system: Secondary | ICD-10-CM | POA: Diagnosis not present

## 2013-01-04 DIAGNOSIS — F411 Generalized anxiety disorder: Secondary | ICD-10-CM | POA: Diagnosis not present

## 2013-01-04 DIAGNOSIS — Y92009 Unspecified place in unspecified non-institutional (private) residence as the place of occurrence of the external cause: Secondary | ICD-10-CM | POA: Insufficient documentation

## 2013-01-04 DIAGNOSIS — Y939 Activity, unspecified: Secondary | ICD-10-CM | POA: Insufficient documentation

## 2013-01-04 DIAGNOSIS — S0510XA Contusion of eyeball and orbital tissues, unspecified eye, initial encounter: Secondary | ICD-10-CM | POA: Diagnosis not present

## 2013-01-04 NOTE — ED Provider Notes (Signed)
History     CSN: [id number]  Arrival date & time 01/04/13  1234   First MD Initiated Contact with Patient 01/04/13 1353      No chief complaint on file.   (Consider location/radiation/quality/duration/timing/severity/associated sxs/prior treatment) The history is provided by the patient.   the patient reports striking his left face with a wooden clock several days ago.  This is an accidental injury.  The patient is on 81 mg aspirin daily.  No loss consciousness.  No nausea or vomiti and he came for evaluation.  He denies visual changes.  No other complaints.ng.  No headache.  He presents the emergency department today because of ongoing bruising around his left eye.  He states this concerned him.   He is independent.  he is well dressed and took himself to church today.  His church members also noted the ecchymosis.    Past Medical History  Diagnosis Date  . Hiatal hernia   . Diverticulosis of colon (without mention of hemorrhage)   . Anxiety   . OCD (obsessive compulsive disorder)   . Viral hepatitis 08/1983  . GERD (gastroesophageal reflux disease)     Past Surgical History  Procedure Laterality Date  . Colonoscopy    . Hemorrhoid surgery      1965    Family History  Problem Relation Age of Onset  . Colon cancer Other     1st cousion.  . Diverticulosis Mother   . Melanoma Mother   . Lung cancer Father     History  Substance Use Topics  . Smoking status: Never Smoker   . Smokeless tobacco: Never Used  . Alcohol Use: 3.5 oz/week    7 drink(s) per week      Review of Systems  All other systems reviewed and are negative.    Allergies  Bee venom  Home Medications   Current Outpatient Rx  Name  Route  Sig  Dispense  Refill  . aspirin 81 MG tablet   Oral   Take 81 mg by mouth daily.           . Cholecalciferol (VITAMIN D) 1000 UNITS capsule   Oral   Take 1,000 Units by mouth daily.           Marland Kitchen EPINEPHrine (EPI-PEN) 0.3 mg/0.3 mL DEVI  Intramuscular   Inject 0.3 mg into the muscle once.         Marland Kitchen LORazepam (ATIVAN) 1 MG tablet   Oral   Take 1 mg by mouth as directed. Take 1 tablet at noon and 2 tablets at bedtime         . Multiple Vitamins-Minerals (ICAPS) CAPS   Oral   Take 1 capsule by mouth 2 (two) times daily.           Marland Kitchen omeprazole (PRILOSEC) 20 MG capsule      Take 2- 20 mg capsules twice daily for 2 weeks and then decrease to 1- 20 mg capsule daily as maintenance.   90 capsule   3   . polyethylene glycol (MIRALAX / GLYCOLAX) packet   Oral   Take 17 g by mouth daily.             BP 137/82  Pulse 66  Temp(Src) 98 F (36.7 C) (Oral)  Resp 16  SpO2 100%  Physical Exam  Nursing note and vitals reviewed. Constitutional: He is oriented to person, place, and time. He appears well-developed and well-nourished.  HENT:  Head: Normocephalic and atraumatic.  Small amount of periorbital ecchymosis around his left eye.  No significant swelling.  No hematoma or laceration.  Extraocular movements are intact.  Vision in his left eye is normal.  Pupils are normal.  Eyes: EOM are normal.  Neck: Normal range of motion.  Cardiovascular: Normal rate, regular rhythm, normal heart sounds and intact distal pulses.   Pulmonary/Chest: Effort normal and breath sounds normal. No respiratory distress.  Abdominal: Soft. He exhibits no distension. There is no tenderness.  Musculoskeletal: Normal range of motion.  Neurological: He is alert and oriented to person, place, and time.  Skin: Skin is warm and dry.  Psychiatric: He has a normal mood and affect. Judgment normal.    ED Course  Procedures (including critical care time)  Labs Reviewed - No data to display No results found.   1. Eye bruise, left, initial encounter       MDM  No indication for imaging.  Bruising under his skin around his eye.  Normal vision.        Lyanne Co, MD 01/04/13 1426

## 2013-01-04 NOTE — ED Notes (Signed)
Pt presents to ed with c/o eye injury. Pt sts he hit himself in the left eye with wooden clock. Pt reports taking 1 baby aspirin daily.

## 2013-01-08 DIAGNOSIS — T148XXA Other injury of unspecified body region, initial encounter: Secondary | ICD-10-CM | POA: Diagnosis not present

## 2013-01-14 DIAGNOSIS — T6391XA Toxic effect of contact with unspecified venomous animal, accidental (unintentional), initial encounter: Secondary | ICD-10-CM | POA: Diagnosis not present

## 2013-01-15 DIAGNOSIS — T6391XA Toxic effect of contact with unspecified venomous animal, accidental (unintentional), initial encounter: Secondary | ICD-10-CM | POA: Diagnosis not present

## 2013-01-19 DIAGNOSIS — L570 Actinic keratosis: Secondary | ICD-10-CM | POA: Diagnosis not present

## 2013-01-19 DIAGNOSIS — L821 Other seborrheic keratosis: Secondary | ICD-10-CM | POA: Diagnosis not present

## 2013-01-26 DIAGNOSIS — T6391XA Toxic effect of contact with unspecified venomous animal, accidental (unintentional), initial encounter: Secondary | ICD-10-CM | POA: Diagnosis not present

## 2013-02-16 DIAGNOSIS — F429 Obsessive-compulsive disorder, unspecified: Secondary | ICD-10-CM | POA: Diagnosis not present

## 2013-02-18 DIAGNOSIS — F429 Obsessive-compulsive disorder, unspecified: Secondary | ICD-10-CM | POA: Diagnosis not present

## 2013-02-23 DIAGNOSIS — T6391XA Toxic effect of contact with unspecified venomous animal, accidental (unintentional), initial encounter: Secondary | ICD-10-CM | POA: Diagnosis not present

## 2013-02-24 DIAGNOSIS — F429 Obsessive-compulsive disorder, unspecified: Secondary | ICD-10-CM | POA: Diagnosis not present

## 2013-03-02 NOTE — Telephone Encounter (Signed)
See other note from 12/22/12

## 2013-03-17 DIAGNOSIS — F429 Obsessive-compulsive disorder, unspecified: Secondary | ICD-10-CM | POA: Diagnosis not present

## 2013-03-23 DIAGNOSIS — T6391XA Toxic effect of contact with unspecified venomous animal, accidental (unintentional), initial encounter: Secondary | ICD-10-CM | POA: Diagnosis not present

## 2013-04-20 DIAGNOSIS — T6391XA Toxic effect of contact with unspecified venomous animal, accidental (unintentional), initial encounter: Secondary | ICD-10-CM | POA: Diagnosis not present

## 2013-05-04 DIAGNOSIS — F429 Obsessive-compulsive disorder, unspecified: Secondary | ICD-10-CM | POA: Diagnosis not present

## 2013-05-22 DIAGNOSIS — T6391XA Toxic effect of contact with unspecified venomous animal, accidental (unintentional), initial encounter: Secondary | ICD-10-CM | POA: Diagnosis not present

## 2013-05-25 DIAGNOSIS — E782 Mixed hyperlipidemia: Secondary | ICD-10-CM | POA: Diagnosis not present

## 2013-05-25 DIAGNOSIS — Z79899 Other long term (current) drug therapy: Secondary | ICD-10-CM | POA: Diagnosis not present

## 2013-05-29 DIAGNOSIS — F429 Obsessive-compulsive disorder, unspecified: Secondary | ICD-10-CM | POA: Diagnosis not present

## 2013-05-29 DIAGNOSIS — Z79899 Other long term (current) drug therapy: Secondary | ICD-10-CM | POA: Diagnosis not present

## 2013-05-29 DIAGNOSIS — K219 Gastro-esophageal reflux disease without esophagitis: Secondary | ICD-10-CM | POA: Diagnosis not present

## 2013-05-29 DIAGNOSIS — J309 Allergic rhinitis, unspecified: Secondary | ICD-10-CM | POA: Diagnosis not present

## 2013-05-29 DIAGNOSIS — Z0000 Encounter for general adult medical examination without abnormal findings: Secondary | ICD-10-CM | POA: Diagnosis not present

## 2013-05-29 DIAGNOSIS — Z1331 Encounter for screening for depression: Secondary | ICD-10-CM | POA: Diagnosis not present

## 2013-05-29 DIAGNOSIS — Z91038 Other insect allergy status: Secondary | ICD-10-CM | POA: Diagnosis not present

## 2013-05-29 DIAGNOSIS — N402 Nodular prostate without lower urinary tract symptoms: Secondary | ICD-10-CM | POA: Diagnosis not present

## 2013-06-04 DIAGNOSIS — N138 Other obstructive and reflux uropathy: Secondary | ICD-10-CM | POA: Diagnosis not present

## 2013-06-18 DIAGNOSIS — N138 Other obstructive and reflux uropathy: Secondary | ICD-10-CM | POA: Diagnosis not present

## 2013-06-19 DIAGNOSIS — T6391XA Toxic effect of contact with unspecified venomous animal, accidental (unintentional), initial encounter: Secondary | ICD-10-CM | POA: Diagnosis not present

## 2013-06-23 DIAGNOSIS — H35369 Drusen (degenerative) of macula, unspecified eye: Secondary | ICD-10-CM | POA: Diagnosis not present

## 2013-06-23 DIAGNOSIS — D3130 Benign neoplasm of unspecified choroid: Secondary | ICD-10-CM | POA: Diagnosis not present

## 2013-06-26 DIAGNOSIS — F429 Obsessive-compulsive disorder, unspecified: Secondary | ICD-10-CM | POA: Diagnosis not present

## 2013-06-26 DIAGNOSIS — J309 Allergic rhinitis, unspecified: Secondary | ICD-10-CM | POA: Diagnosis not present

## 2013-06-26 DIAGNOSIS — N402 Nodular prostate without lower urinary tract symptoms: Secondary | ICD-10-CM | POA: Diagnosis not present

## 2013-06-26 DIAGNOSIS — R809 Proteinuria, unspecified: Secondary | ICD-10-CM | POA: Diagnosis not present

## 2013-06-26 DIAGNOSIS — K219 Gastro-esophageal reflux disease without esophagitis: Secondary | ICD-10-CM | POA: Diagnosis not present

## 2013-06-26 DIAGNOSIS — Z0000 Encounter for general adult medical examination without abnormal findings: Secondary | ICD-10-CM | POA: Diagnosis not present

## 2013-06-26 DIAGNOSIS — Z79899 Other long term (current) drug therapy: Secondary | ICD-10-CM | POA: Diagnosis not present

## 2013-06-26 DIAGNOSIS — Z1331 Encounter for screening for depression: Secondary | ICD-10-CM | POA: Diagnosis not present

## 2013-07-07 DIAGNOSIS — R809 Proteinuria, unspecified: Secondary | ICD-10-CM | POA: Diagnosis not present

## 2013-07-16 DIAGNOSIS — T6391XA Toxic effect of contact with unspecified venomous animal, accidental (unintentional), initial encounter: Secondary | ICD-10-CM | POA: Diagnosis not present

## 2013-07-17 DIAGNOSIS — T6391XA Toxic effect of contact with unspecified venomous animal, accidental (unintentional), initial encounter: Secondary | ICD-10-CM | POA: Diagnosis not present

## 2013-07-27 DIAGNOSIS — B351 Tinea unguium: Secondary | ICD-10-CM | POA: Diagnosis not present

## 2013-07-27 DIAGNOSIS — L570 Actinic keratosis: Secondary | ICD-10-CM | POA: Diagnosis not present

## 2013-07-27 DIAGNOSIS — L821 Other seborrheic keratosis: Secondary | ICD-10-CM | POA: Diagnosis not present

## 2013-08-12 DIAGNOSIS — J309 Allergic rhinitis, unspecified: Secondary | ICD-10-CM | POA: Diagnosis not present

## 2013-08-12 DIAGNOSIS — T6391XA Toxic effect of contact with unspecified venomous animal, accidental (unintentional), initial encounter: Secondary | ICD-10-CM | POA: Diagnosis not present

## 2013-08-13 DIAGNOSIS — F429 Obsessive-compulsive disorder, unspecified: Secondary | ICD-10-CM | POA: Diagnosis not present

## 2013-09-22 DIAGNOSIS — T6391XA Toxic effect of contact with unspecified venomous animal, accidental (unintentional), initial encounter: Secondary | ICD-10-CM | POA: Diagnosis not present

## 2013-10-02 ENCOUNTER — Telehealth: Payer: Self-pay | Admitting: Internal Medicine

## 2013-10-02 ENCOUNTER — Ambulatory Visit (INDEPENDENT_AMBULATORY_CARE_PROVIDER_SITE_OTHER): Payer: Medicare Other | Admitting: Internal Medicine

## 2013-10-02 ENCOUNTER — Encounter: Payer: Self-pay | Admitting: Internal Medicine

## 2013-10-02 VITALS — BP 106/70 | HR 76 | Ht 65.5 in | Wt 143.1 lb

## 2013-10-02 DIAGNOSIS — K222 Esophageal obstruction: Secondary | ICD-10-CM | POA: Diagnosis not present

## 2013-10-02 DIAGNOSIS — K219 Gastro-esophageal reflux disease without esophagitis: Secondary | ICD-10-CM

## 2013-10-02 DIAGNOSIS — K449 Diaphragmatic hernia without obstruction or gangrene: Secondary | ICD-10-CM

## 2013-10-02 DIAGNOSIS — R1310 Dysphagia, unspecified: Secondary | ICD-10-CM

## 2013-10-02 NOTE — Patient Instructions (Signed)
You have been given a separate informational sheet regarding your tobacco use, the importance of quitting and local resources to help you quit.   Please follow up with Dr. Henrene Pastor as needed

## 2013-10-02 NOTE — Telephone Encounter (Signed)
All questions answered.  He will call back with any additional questions or concerns.

## 2013-10-02 NOTE — Progress Notes (Signed)
HISTORY OF PRESENT ILLNESS:  Adam Hoyt. is a 80 y.o. male , former patient of Dr. Verl Blalock, with a history of anxiety, OCD, chronic constipation and GERD with esophageal stricture. He presents today with questions regarding chronic PPI use and is constipation. The patient was last seen by Dr. Sharlett Iles February 2014 with chronic nonspecific dysphagia and throat clearance. He underwent upper endoscopy and was found to have a distal esophageal ring as well as a hiatal hernia. Esophageal dilation performed to a maximal diameter 54 [place]. The patient was placed on omeprazole which she currently takes a dose of 20 mg daily. After his endoscopy and medical therapy, his upper GI symptoms resolved. He brings with him a several paraghraph article from the newspaper from the "[place]" discussing possible concerns with PPI use such as pneumonia. He goes on about medication given him many years ago causing hepatitis. He is fearful this medication will cause him problems. He does not feel that there is any benefit. He wants to take over-the-counter Gaviscon. He also asked if he should continue on MiraLax for his constipation. This helps.  REVIEW OF SYSTEMS:  All non-GI ROS negative except for anxiety and fatigue  Past Medical History  Diagnosis Date  . Hiatal hernia   . Diverticulosis of colon (without mention of hemorrhage)   . Anxiety   . OCD (obsessive compulsive disorder)   . Viral hepatitis 08/1983  . GERD (gastroesophageal reflux disease)   . Esophageal stricture   . Colon polyp     hyperplastic    Past Surgical History  Procedure Laterality Date  . Colonoscopy    . Hemorrhoid surgery      [place].  reports that he has never smoked. He has never used smokeless tobacco. He reports that he drinks about 3.5 ounces of alcohol per week. He reports that he does not use illicit drugs.  family history includes Colon cancer in his other;  Diverticulosis in his mother; Lung cancer in his father; Melanoma in his mother.  Allergies  Allergen Reactions  . Bee Venom Anaphylaxis       PHYSICAL EXAMINATION: Vital signs: BP 106/70  Pulse 76  Ht 5' 5.5" (1.664 m)  Wt 143 lb 2 oz (64.921 kg)  BMI 23.45 kg/m2 General: Well-developed, well-nourished, no acute distress HEENT: Sclerae are anicteric, conjunctiva pink. Oral mucosa intact Lungs: Clear Heart: Regular Abdomen: soft, nontender, nondistended, no obvious ascites, no peritoneal signs, normal bowel sounds. No organomegaly. Extremities: No edema Psychiatric: alert and oriented x3. Cooperative   ASSESSMENT:  #1. Patient was seen last year with chronic dysphagia and throat clearing. Symptoms resolved after dilation and placement on PPI. I do suspect that some of his issues are GERD related. However, he does not want to take PPI. That's fine with me. #2. Chronic constipation. #3. Last colonoscopy July 2012 with severe left-sided diverticulosis and non-adenomatous polyp.  PLAN:  #1. Reflux precautions #2. Okay to discontinue PPI. May have acid rebound or recurrent symptoms it let him to his initial assessment with Dr. Sharlett Iles. We will see #3. Okay for him to use Gaviscon if he wishes #4. Continue MiraLax for constipation #5. GI followup as needed

## 2013-11-04 DIAGNOSIS — T6391XA Toxic effect of contact with unspecified venomous animal, accidental (unintentional), initial encounter: Secondary | ICD-10-CM | POA: Diagnosis not present

## 2013-11-11 DIAGNOSIS — K449 Diaphragmatic hernia without obstruction or gangrene: Secondary | ICD-10-CM | POA: Diagnosis not present

## 2013-11-11 DIAGNOSIS — R82998 Other abnormal findings in urine: Secondary | ICD-10-CM | POA: Diagnosis not present

## 2013-11-11 DIAGNOSIS — M79609 Pain in unspecified limb: Secondary | ICD-10-CM | POA: Diagnosis not present

## 2013-11-30 DIAGNOSIS — H02409 Unspecified ptosis of unspecified eyelid: Secondary | ICD-10-CM | POA: Diagnosis not present

## 2013-11-30 DIAGNOSIS — D3130 Benign neoplasm of unspecified choroid: Secondary | ICD-10-CM | POA: Diagnosis not present

## 2013-11-30 DIAGNOSIS — Z961 Presence of intraocular lens: Secondary | ICD-10-CM | POA: Diagnosis not present

## 2013-11-30 DIAGNOSIS — H353190 Nonexudative age-related macular degeneration, unspecified eye, stage unspecified: Secondary | ICD-10-CM | POA: Diagnosis not present

## 2013-12-02 DIAGNOSIS — F429 Obsessive-compulsive disorder, unspecified: Secondary | ICD-10-CM | POA: Diagnosis not present

## 2013-12-15 DIAGNOSIS — T6391XA Toxic effect of contact with unspecified venomous animal, accidental (unintentional), initial encounter: Secondary | ICD-10-CM | POA: Diagnosis not present

## 2013-12-24 DIAGNOSIS — Z23 Encounter for immunization: Secondary | ICD-10-CM | POA: Diagnosis not present

## 2014-01-14 DIAGNOSIS — D692 Other nonthrombocytopenic purpura: Secondary | ICD-10-CM | POA: Diagnosis not present

## 2014-01-14 DIAGNOSIS — B351 Tinea unguium: Secondary | ICD-10-CM | POA: Diagnosis not present

## 2014-01-14 DIAGNOSIS — L570 Actinic keratosis: Secondary | ICD-10-CM | POA: Diagnosis not present

## 2014-01-14 DIAGNOSIS — L821 Other seborrheic keratosis: Secondary | ICD-10-CM | POA: Diagnosis not present

## 2014-01-26 DIAGNOSIS — T6391XA Toxic effect of contact with unspecified venomous animal, accidental (unintentional), initial encounter: Secondary | ICD-10-CM | POA: Diagnosis not present

## 2014-02-05 DIAGNOSIS — T6391XA Toxic effect of contact with unspecified venomous animal, accidental (unintentional), initial encounter: Secondary | ICD-10-CM | POA: Diagnosis not present

## 2014-02-08 DIAGNOSIS — T6391XA Toxic effect of contact with unspecified venomous animal, accidental (unintentional), initial encounter: Secondary | ICD-10-CM | POA: Diagnosis not present

## 2014-02-09 DIAGNOSIS — Z961 Presence of intraocular lens: Secondary | ICD-10-CM | POA: Diagnosis not present

## 2014-02-09 DIAGNOSIS — H02409 Unspecified ptosis of unspecified eyelid: Secondary | ICD-10-CM | POA: Diagnosis not present

## 2014-02-09 DIAGNOSIS — H0230 Blepharochalasis unspecified eye, unspecified eyelid: Secondary | ICD-10-CM | POA: Diagnosis not present

## 2014-03-02 DIAGNOSIS — F429 Obsessive-compulsive disorder, unspecified: Secondary | ICD-10-CM | POA: Diagnosis not present

## 2014-03-03 DIAGNOSIS — H6120 Impacted cerumen, unspecified ear: Secondary | ICD-10-CM | POA: Diagnosis not present

## 2014-03-08 DIAGNOSIS — T6391XA Toxic effect of contact with unspecified venomous animal, accidental (unintentional), initial encounter: Secondary | ICD-10-CM | POA: Diagnosis not present

## 2014-03-17 DIAGNOSIS — T6391XA Toxic effect of contact with unspecified venomous animal, accidental (unintentional), initial encounter: Secondary | ICD-10-CM | POA: Diagnosis not present

## 2014-04-19 DIAGNOSIS — T6391XA Toxic effect of contact with unspecified venomous animal, accidental (unintentional), initial encounter: Secondary | ICD-10-CM | POA: Diagnosis not present

## 2014-04-23 DIAGNOSIS — H02409 Unspecified ptosis of unspecified eyelid: Secondary | ICD-10-CM | POA: Diagnosis not present

## 2014-04-25 DIAGNOSIS — R9431 Abnormal electrocardiogram [ECG] [EKG]: Secondary | ICD-10-CM | POA: Diagnosis not present

## 2014-05-07 DIAGNOSIS — K219 Gastro-esophageal reflux disease without esophagitis: Secondary | ICD-10-CM | POA: Diagnosis not present

## 2014-05-07 DIAGNOSIS — H02409 Unspecified ptosis of unspecified eyelid: Secondary | ICD-10-CM | POA: Diagnosis not present

## 2014-05-07 DIAGNOSIS — F411 Generalized anxiety disorder: Secondary | ICD-10-CM | POA: Diagnosis not present

## 2014-05-13 DIAGNOSIS — H02409 Unspecified ptosis of unspecified eyelid: Secondary | ICD-10-CM | POA: Diagnosis not present

## 2014-05-19 ENCOUNTER — Other Ambulatory Visit: Payer: Self-pay | Admitting: Family Medicine

## 2014-05-19 ENCOUNTER — Other Ambulatory Visit: Payer: Self-pay | Admitting: *Deleted

## 2014-05-19 DIAGNOSIS — Z79899 Other long term (current) drug therapy: Secondary | ICD-10-CM | POA: Diagnosis not present

## 2014-05-19 DIAGNOSIS — R5381 Other malaise: Secondary | ICD-10-CM | POA: Diagnosis not present

## 2014-05-19 DIAGNOSIS — R0989 Other specified symptoms and signs involving the circulatory and respiratory systems: Secondary | ICD-10-CM

## 2014-05-19 DIAGNOSIS — F411 Generalized anxiety disorder: Secondary | ICD-10-CM | POA: Diagnosis not present

## 2014-05-28 ENCOUNTER — Ambulatory Visit
Admission: RE | Admit: 2014-05-28 | Discharge: 2014-05-28 | Disposition: A | Discharge status: Home / Self Care | Payer: Medicare Other | Source: Ambulatory Visit | Attending: Family Medicine | Admitting: Family Medicine

## 2014-05-28 ENCOUNTER — Other Ambulatory Visit: Payer: Medicare Other

## 2014-05-28 DIAGNOSIS — R0989 Other specified symptoms and signs involving the circulatory and respiratory systems: Secondary | ICD-10-CM | POA: Diagnosis not present

## 2014-06-01 DIAGNOSIS — T6391XA Toxic effect of contact with unspecified venomous animal, accidental (unintentional), initial encounter: Secondary | ICD-10-CM | POA: Diagnosis not present

## 2014-06-10 DIAGNOSIS — Z23 Encounter for immunization: Secondary | ICD-10-CM | POA: Diagnosis not present

## 2014-06-16 DIAGNOSIS — N403 Nodular prostate with lower urinary tract symptoms: Secondary | ICD-10-CM | POA: Diagnosis not present

## 2014-06-22 DIAGNOSIS — D3131 Benign neoplasm of right choroid: Secondary | ICD-10-CM | POA: Diagnosis not present

## 2014-06-22 DIAGNOSIS — H35363 Drusen (degenerative) of macula, bilateral: Secondary | ICD-10-CM | POA: Diagnosis not present

## 2014-06-22 DIAGNOSIS — H43811 Vitreous degeneration, right eye: Secondary | ICD-10-CM | POA: Diagnosis not present

## 2014-06-22 DIAGNOSIS — H3531 Nonexudative age-related macular degeneration: Secondary | ICD-10-CM | POA: Diagnosis not present

## 2014-06-23 DIAGNOSIS — R351 Nocturia: Secondary | ICD-10-CM | POA: Diagnosis not present

## 2014-06-23 DIAGNOSIS — N403 Nodular prostate with lower urinary tract symptoms: Secondary | ICD-10-CM | POA: Diagnosis not present

## 2014-06-30 DIAGNOSIS — F42 Obsessive-compulsive disorder: Secondary | ICD-10-CM | POA: Diagnosis not present

## 2014-07-12 DIAGNOSIS — Z79899 Other long term (current) drug therapy: Secondary | ICD-10-CM | POA: Diagnosis not present

## 2014-07-12 DIAGNOSIS — E782 Mixed hyperlipidemia: Secondary | ICD-10-CM | POA: Diagnosis not present

## 2014-07-12 DIAGNOSIS — N402 Nodular prostate without lower urinary tract symptoms: Secondary | ICD-10-CM | POA: Diagnosis not present

## 2014-07-14 DIAGNOSIS — Z91038 Other insect allergy status: Secondary | ICD-10-CM | POA: Diagnosis not present

## 2014-07-14 DIAGNOSIS — E782 Mixed hyperlipidemia: Secondary | ICD-10-CM | POA: Diagnosis not present

## 2014-07-14 DIAGNOSIS — F419 Anxiety disorder, unspecified: Secondary | ICD-10-CM | POA: Diagnosis not present

## 2014-07-14 DIAGNOSIS — J309 Allergic rhinitis, unspecified: Secondary | ICD-10-CM | POA: Diagnosis not present

## 2014-07-14 DIAGNOSIS — Z79899 Other long term (current) drug therapy: Secondary | ICD-10-CM | POA: Diagnosis not present

## 2014-07-14 DIAGNOSIS — K219 Gastro-esophageal reflux disease without esophagitis: Secondary | ICD-10-CM | POA: Diagnosis not present

## 2014-07-14 DIAGNOSIS — Z0000 Encounter for general adult medical examination without abnormal findings: Secondary | ICD-10-CM | POA: Diagnosis not present

## 2014-07-14 DIAGNOSIS — F42 Obsessive-compulsive disorder: Secondary | ICD-10-CM | POA: Diagnosis not present

## 2014-07-14 DIAGNOSIS — R809 Proteinuria, unspecified: Secondary | ICD-10-CM | POA: Diagnosis not present

## 2014-07-15 DIAGNOSIS — T63441A Toxic effect of venom of bees, accidental (unintentional), initial encounter: Secondary | ICD-10-CM | POA: Diagnosis not present

## 2014-07-21 DIAGNOSIS — L570 Actinic keratosis: Secondary | ICD-10-CM | POA: Diagnosis not present

## 2014-07-21 DIAGNOSIS — L821 Other seborrheic keratosis: Secondary | ICD-10-CM | POA: Diagnosis not present

## 2014-08-10 DIAGNOSIS — R040 Epistaxis: Secondary | ICD-10-CM | POA: Diagnosis not present

## 2014-08-10 DIAGNOSIS — J069 Acute upper respiratory infection, unspecified: Secondary | ICD-10-CM | POA: Diagnosis not present

## 2014-08-12 DIAGNOSIS — T63441A Toxic effect of venom of bees, accidental (unintentional), initial encounter: Secondary | ICD-10-CM | POA: Diagnosis not present

## 2014-08-13 DIAGNOSIS — R040 Epistaxis: Secondary | ICD-10-CM | POA: Diagnosis not present

## 2014-08-25 DIAGNOSIS — T63441A Toxic effect of venom of bees, accidental (unintentional), initial encounter: Secondary | ICD-10-CM | POA: Diagnosis not present

## 2014-09-21 DIAGNOSIS — T63441A Toxic effect of venom of bees, accidental (unintentional), initial encounter: Secondary | ICD-10-CM | POA: Diagnosis not present

## 2014-09-22 DIAGNOSIS — T63441A Toxic effect of venom of bees, accidental (unintentional), initial encounter: Secondary | ICD-10-CM | POA: Diagnosis not present

## 2014-10-06 DIAGNOSIS — T63441A Toxic effect of venom of bees, accidental (unintentional), initial encounter: Secondary | ICD-10-CM | POA: Diagnosis not present

## 2014-11-17 DIAGNOSIS — T63441A Toxic effect of venom of bees, accidental (unintentional), initial encounter: Secondary | ICD-10-CM | POA: Diagnosis not present

## 2014-11-29 DIAGNOSIS — F42 Obsessive-compulsive disorder: Secondary | ICD-10-CM | POA: Diagnosis not present

## 2014-12-08 DIAGNOSIS — L718 Other rosacea: Secondary | ICD-10-CM | POA: Diagnosis not present

## 2014-12-08 DIAGNOSIS — H04123 Dry eye syndrome of bilateral lacrimal glands: Secondary | ICD-10-CM | POA: Diagnosis not present

## 2014-12-08 DIAGNOSIS — H3531 Nonexudative age-related macular degeneration: Secondary | ICD-10-CM | POA: Diagnosis not present

## 2014-12-08 DIAGNOSIS — H524 Presbyopia: Secondary | ICD-10-CM | POA: Diagnosis not present

## 2014-12-15 DIAGNOSIS — K219 Gastro-esophageal reflux disease without esophagitis: Secondary | ICD-10-CM | POA: Diagnosis not present

## 2014-12-29 DIAGNOSIS — T63441A Toxic effect of venom of bees, accidental (unintentional), initial encounter: Secondary | ICD-10-CM | POA: Diagnosis not present

## 2015-01-20 DIAGNOSIS — L905 Scar conditions and fibrosis of skin: Secondary | ICD-10-CM | POA: Diagnosis not present

## 2015-01-20 DIAGNOSIS — L821 Other seborrheic keratosis: Secondary | ICD-10-CM | POA: Diagnosis not present

## 2015-01-25 DIAGNOSIS — F411 Generalized anxiety disorder: Secondary | ICD-10-CM | POA: Diagnosis not present

## 2015-02-10 DIAGNOSIS — T63441A Toxic effect of venom of bees, accidental (unintentional), initial encounter: Secondary | ICD-10-CM | POA: Diagnosis not present

## 2015-03-17 DIAGNOSIS — F411 Generalized anxiety disorder: Secondary | ICD-10-CM | POA: Diagnosis not present

## 2015-03-24 DIAGNOSIS — T63441A Toxic effect of venom of bees, accidental (unintentional), initial encounter: Secondary | ICD-10-CM | POA: Diagnosis not present

## 2015-03-31 DIAGNOSIS — T63441A Toxic effect of venom of bees, accidental (unintentional), initial encounter: Secondary | ICD-10-CM | POA: Diagnosis not present

## 2015-04-07 DIAGNOSIS — B351 Tinea unguium: Secondary | ICD-10-CM | POA: Diagnosis not present

## 2015-04-07 DIAGNOSIS — L300 Nummular dermatitis: Secondary | ICD-10-CM | POA: Diagnosis not present

## 2015-04-11 DIAGNOSIS — F42 Obsessive-compulsive disorder: Secondary | ICD-10-CM | POA: Diagnosis not present

## 2015-04-15 DIAGNOSIS — F411 Generalized anxiety disorder: Secondary | ICD-10-CM | POA: Diagnosis not present

## 2015-05-05 DIAGNOSIS — T63441A Toxic effect of venom of bees, accidental (unintentional), initial encounter: Secondary | ICD-10-CM | POA: Diagnosis not present

## 2015-05-13 DIAGNOSIS — F411 Generalized anxiety disorder: Secondary | ICD-10-CM | POA: Diagnosis not present

## 2015-05-18 DIAGNOSIS — R21 Rash and other nonspecific skin eruption: Secondary | ICD-10-CM | POA: Diagnosis not present

## 2015-05-21 DIAGNOSIS — Z91030 Bee allergy status: Secondary | ICD-10-CM | POA: Insufficient documentation

## 2015-05-21 DIAGNOSIS — Z91038 Other insect allergy status: Secondary | ICD-10-CM

## 2015-05-31 DIAGNOSIS — T63441D Toxic effect of venom of bees, accidental (unintentional), subsequent encounter: Secondary | ICD-10-CM | POA: Diagnosis not present

## 2015-06-06 DIAGNOSIS — F42 Obsessive-compulsive disorder: Secondary | ICD-10-CM | POA: Diagnosis not present

## 2015-06-08 ENCOUNTER — Other Ambulatory Visit: Payer: Self-pay | Admitting: Allergy and Immunology

## 2015-06-08 DIAGNOSIS — T782XXA Anaphylactic shock, unspecified, initial encounter: Secondary | ICD-10-CM | POA: Diagnosis not present

## 2015-06-08 DIAGNOSIS — T63441A Toxic effect of venom of bees, accidental (unintentional), initial encounter: Secondary | ICD-10-CM | POA: Diagnosis not present

## 2015-06-10 LAB — TRYPTASE: Tryptase: 6.8 ug/L (ref <11)

## 2015-06-13 ENCOUNTER — Telehealth: Payer: Self-pay

## 2015-06-13 NOTE — Telephone Encounter (Signed)
Left message for patient to call office.  

## 2015-06-13 NOTE — Telephone Encounter (Signed)
-----   Message from Ephraim Hamburger, MD sent at 06/13/2015  8:34 AM EDT ----- Please inform patient that the tryptase level is normal.  Please pull chart to see if venom immunotherapy orders have been written and patient has signed consent.  Please inform me if this is not the case.  If orders have been written please have him schedule an appointment for his first venom immunotherapy injection and let him know that he will not have to schedule appointments after that.

## 2015-06-13 NOTE — Telephone Encounter (Signed)
Patient is currently getting venom injections.  He is coming in on 06/30/15 for his next  Injection. His last injection was on 05/05/15. Do you still need for him to sign an consent form? I have informed him about his lab results.

## 2015-06-13 NOTE — Telephone Encounter (Signed)
If he has started VIT I'm assuming he has already signed consent. If not, he needs to. Thanks.

## 2015-06-24 DIAGNOSIS — F411 Generalized anxiety disorder: Secondary | ICD-10-CM | POA: Diagnosis not present

## 2015-06-27 DIAGNOSIS — N138 Other obstructive and reflux uropathy: Secondary | ICD-10-CM | POA: Diagnosis not present

## 2015-06-27 DIAGNOSIS — N403 Nodular prostate with lower urinary tract symptoms: Secondary | ICD-10-CM | POA: Diagnosis not present

## 2015-06-27 DIAGNOSIS — R3915 Urgency of urination: Secondary | ICD-10-CM | POA: Diagnosis not present

## 2015-06-28 DIAGNOSIS — H353131 Nonexudative age-related macular degeneration, bilateral, early dry stage: Secondary | ICD-10-CM | POA: Diagnosis not present

## 2015-06-28 DIAGNOSIS — D3131 Benign neoplasm of right choroid: Secondary | ICD-10-CM | POA: Diagnosis not present

## 2015-06-30 ENCOUNTER — Ambulatory Visit (INDEPENDENT_AMBULATORY_CARE_PROVIDER_SITE_OTHER): Payer: Medicare Other | Admitting: Neurology

## 2015-06-30 DIAGNOSIS — T63441D Toxic effect of venom of bees, accidental (unintentional), subsequent encounter: Secondary | ICD-10-CM

## 2015-07-14 DIAGNOSIS — Z125 Encounter for screening for malignant neoplasm of prostate: Secondary | ICD-10-CM | POA: Diagnosis not present

## 2015-07-14 DIAGNOSIS — E782 Mixed hyperlipidemia: Secondary | ICD-10-CM | POA: Diagnosis not present

## 2015-07-14 DIAGNOSIS — Z79899 Other long term (current) drug therapy: Secondary | ICD-10-CM | POA: Diagnosis not present

## 2015-07-21 DIAGNOSIS — K219 Gastro-esophageal reflux disease without esophagitis: Secondary | ICD-10-CM | POA: Diagnosis not present

## 2015-07-21 DIAGNOSIS — Z79899 Other long term (current) drug therapy: Secondary | ICD-10-CM | POA: Diagnosis not present

## 2015-07-21 DIAGNOSIS — Z23 Encounter for immunization: Secondary | ICD-10-CM | POA: Diagnosis not present

## 2015-07-21 DIAGNOSIS — Z91038 Other insect allergy status: Secondary | ICD-10-CM | POA: Diagnosis not present

## 2015-07-21 DIAGNOSIS — Z0000 Encounter for general adult medical examination without abnormal findings: Secondary | ICD-10-CM | POA: Diagnosis not present

## 2015-07-21 DIAGNOSIS — F419 Anxiety disorder, unspecified: Secondary | ICD-10-CM | POA: Diagnosis not present

## 2015-07-21 DIAGNOSIS — H6120 Impacted cerumen, unspecified ear: Secondary | ICD-10-CM | POA: Diagnosis not present

## 2015-07-21 DIAGNOSIS — E782 Mixed hyperlipidemia: Secondary | ICD-10-CM | POA: Diagnosis not present

## 2015-08-03 ENCOUNTER — Ambulatory Visit (INDEPENDENT_AMBULATORY_CARE_PROVIDER_SITE_OTHER): Payer: Medicare Other | Admitting: Family Medicine

## 2015-08-03 VITALS — BP 102/68 | HR 77 | Temp 97.5°F | Resp 18 | Ht 67.0 in | Wt 152.4 lb

## 2015-08-03 DIAGNOSIS — E860 Dehydration: Secondary | ICD-10-CM

## 2015-08-03 DIAGNOSIS — J392 Other diseases of pharynx: Secondary | ICD-10-CM | POA: Diagnosis not present

## 2015-08-03 DIAGNOSIS — R531 Weakness: Secondary | ICD-10-CM

## 2015-08-03 DIAGNOSIS — R112 Nausea with vomiting, unspecified: Secondary | ICD-10-CM | POA: Diagnosis not present

## 2015-08-03 DIAGNOSIS — A09 Infectious gastroenteritis and colitis, unspecified: Secondary | ICD-10-CM | POA: Diagnosis not present

## 2015-08-03 LAB — POCT CBC
Granulocyte percent: 88.7 %G — AB (ref 37–80)
HCT, POC: 46 % (ref 43.5–53.7)
Hemoglobin: 15.8 g/dL (ref 14.1–18.1)
Lymph, poc: 0.5 — AB (ref 0.6–3.4)
MCH, POC: 33.2 pg — AB (ref 27–31.2)
MCHC: 34.5 g/dL (ref 31.8–35.4)
MCV: 96.2 fL (ref 80–97)
MID (cbc): 0.5 (ref 0–0.9)
MPV: 6.9 fL (ref 0–99.8)
POC Granulocyte: 7.8 — AB (ref 2–6.9)
POC LYMPH PERCENT: 5.7 %L — AB (ref 10–50)
POC MID %: 5.6 %M (ref 0–12)
Platelet Count, POC: 193 10*3/uL (ref 142–424)
RBC: 4.78 M/uL (ref 4.69–6.13)
RDW, POC: 13.4 %
WBC: 8.8 10*3/uL (ref 4.6–10.2)

## 2015-08-03 LAB — POCT RAPID STREP A (OFFICE): Rapid Strep A Screen: NEGATIVE

## 2015-08-03 LAB — GLUCOSE, POCT (MANUAL RESULT ENTRY): POC Glucose: 119 mg/dl — AB (ref 70–99)

## 2015-08-03 MED ORDER — ONDANSETRON 4 MG PO TBDP
4.0000 mg | ORAL_TABLET | Freq: Once | ORAL | Status: AC
  Administered 2015-08-03: 4 mg via ORAL

## 2015-08-03 MED ORDER — ONDANSETRON 4 MG PO TBDP: ORAL_TABLET | ORAL | Status: DC

## 2015-08-03 NOTE — Patient Instructions (Signed)
Try to drink plenty of fluids. Avoid alcohol and dairy products.  Tonight for supper just stick with a very bland stuff such as chicken soup and crackers.  Tomorrow for breakfast eat primarily breads and eggs but avoid too much dairy products or fried things like bacon.  If you're doing well tomorrow you can resume a regular diet at lunch, but I would not overeat thanksgiving dinner.  If you're not doing well by midday tomorrow continue eating very bland food  If more vomiting and diarrhea such that you are concerned of getting dehydrated you will need to go to the emergency room. We'll be closed tomorrow.  Take Zofran (ondansetron) one every 6-8 hours if needed for nausea and vomiting  Take over-the-counter Imodium 1 pill every 8 hours only if needed for bad diarrhea. Too much Imodium in an elderly person can cause used to then to develop bad constipation.

## 2015-08-03 NOTE — Progress Notes (Signed)
Patient ID: Adam Swanson., male    DOB: 1928/08/11  Age: 81 y.o. MRN: [id number]  Chief Complaint  Patient presents with  . Diarrhea    mornin, no abdominal pain   . Emesis    Subjective:   81 year old gentleman who lives at an assisted living facility. The patient had breakfast this morning of a routine type. He also felt the need to go and had to go to the bathroom. He vomited profusely, then has diarrhea. He tried to take some fluids and take it easy. He talked to the nurse who did not give him anything. Later he had a second episode of diarrhea and vomiting. He feels extremely weak. Has not had anything down and today he thinks is getting dehydrated. He had this problem once a few years ago and got dehydrated with it. He is not having any abdominal pain or fever. He generally is healthy. He has history of anxiety and depression and takes some medications for that, otherwise is not on any major meds. No heart history. No regular GI problems. He has had a colonoscopy. Remainder of review of systems including neurologic, cardiovascular, respiratory, GU, neurologic, psychiatric, dermatologic, endocrinologic, all were negative.  Current allergies, medications, problem list, past/family and social histories reviewed.  Objective:  BP 102/68 mmHg  Pulse 77  Temp(Src) 97.5 F (36.4 C) (Oral)  Resp 18  Ht 5\' 7"  (1.702 m)  Wt 152 lb 6.4 oz (69.128 kg)  BMI 23.86 kg/m2  SpO2 97%  Pleasant elderly gentleman in no major distress, just weak. He had a little assistance from chair to table. His TMs are normal. Throat erythematous without exudate. Neck supple without nodes. Chest is clear to auscultation. Heart regular without murmurs, gallops, or arrhythmias. Abdomen has bowel sounds present. Soft without masses or tenderness. Turgor is a little diminished.  Assessment & Plan:   Assessment: 1. Nausea and vomiting, intractability of vomiting not specified, unspecified vomiting type   2. Weakness    3. Diarrhea of infectious origin   4. Dehydration   5. Throat trouble       Plan: We'll give 1 unit of fluids to try and hydrate him a little. Check a CBC and glucose. Gave him Zofran 4 mg ODT for the nausea. After hydration will reassess and see the labs and decide additional treatment if needed. He wants to know what he should and shouldn't eat.  Results for orders placed or performed in visit on 08/03/15  POCT CBC  Result Value Ref Range   WBC 8.8 4.6 - 10.2 K/uL   Lymph, poc 0.5 (A) 0.6 - 3.4   POC LYMPH PERCENT 5.7 (A) 10 - 50 %L   MID (cbc) 0.5 0 - 0.9   POC MID % 5.6 0 - 12 %M   POC Granulocyte 7.8 (A) 2 - 6.9   Granulocyte percent 88.7 (A) 37 - 80 %G   RBC 4.78 4.69 - 6.13 M/uL   Hemoglobin 15.8 14.1 - 18.1 g/dL   HCT, POC 46.0 43.5 - 53.7 %   MCV 96.2 80 - 97 fL   MCH, POC 33.2 (A) 27 - 31.2 pg   MCHC 34.5 31.8 - 35.4 g/dL   RDW, POC 13.4 %   Platelet Count, POC 193 142 - 424 K/uL   MPV 6.9 0 - 99.8 fL  POCT glucose (manual entry)  Result Value Ref Range   POC Glucose 119 (A) 70 - 99 mg/dl  POCT rapid strep A  Result  Value Ref Range   Rapid Strep A Screen Negative Negative   Strep was negative  Will hydrate and treat symptomatically afterwards.  Patient was checked on a half dozen times over the 2 hours for a cumulative time with patient at least 50 minutes face-to-face. while we infused the IV fluid. His nausea subsided and he did not vomit anymore. He never developed any need for urination. No abdominal pain.   Patient Instructions  Try to drink plenty of fluids. Avoid alcohol and dairy products.  Tonight for supper just stick with a very bland stuff such as chicken soup and crackers.  Tomorrow for breakfast eat primarily breads and eggs but avoid too much dairy products or fried things like bacon.  If you're doing well tomorrow you can resume a regular diet at lunch, but I would not overeat thanksgiving dinner.  If you're not doing well by midday  tomorrow continue eating very bland food  If more vomiting and diarrhea such that you are concerned of getting dehydrated you will need to go to the emergency room. We'll be closed tomorrow.  Take Zofran (ondansetron) one every 6-8 hours if needed for nausea and vomiting  Take over-the-counter Imodium 1 pill every 8 hours only if needed for bad diarrhea. Too much Imodium in an elderly person can cause used to then to develop bad constipation.   discussed the instructions in detail with the patient. He knows to go to the emergency room if he gets worse tomorrow since we are closed. He was still pretty weak when he left, but didn't seem as unstable as he was when he came in. He has a ride which is arrived to take him.   Return if symptoms worsen or fail to improve.   Brack Shaddock, MD 08/03/2015

## 2015-08-16 DIAGNOSIS — F411 Generalized anxiety disorder: Secondary | ICD-10-CM | POA: Diagnosis not present

## 2015-08-24 ENCOUNTER — Ambulatory Visit (INDEPENDENT_AMBULATORY_CARE_PROVIDER_SITE_OTHER): Payer: Medicare Other

## 2015-08-24 DIAGNOSIS — T63441D Toxic effect of venom of bees, accidental (unintentional), subsequent encounter: Secondary | ICD-10-CM

## 2015-09-16 DIAGNOSIS — R040 Epistaxis: Secondary | ICD-10-CM | POA: Diagnosis not present

## 2015-09-21 DIAGNOSIS — R05 Cough: Secondary | ICD-10-CM | POA: Diagnosis not present

## 2015-09-21 DIAGNOSIS — J209 Acute bronchitis, unspecified: Secondary | ICD-10-CM | POA: Diagnosis not present

## 2015-09-21 DIAGNOSIS — R5383 Other fatigue: Secondary | ICD-10-CM | POA: Diagnosis not present

## 2015-09-23 ENCOUNTER — Emergency Department (HOSPITAL_COMMUNITY): Payer: Medicare Other

## 2015-09-23 ENCOUNTER — Inpatient Hospital Stay (HOSPITAL_COMMUNITY)
Admission: EM | Admit: 2015-09-23 | Discharge: 2015-09-26 | DRG: 194 | Disposition: A | Discharge status: Skilled Nursing Facility | Payer: Medicare Other | Attending: Internal Medicine | Admitting: Internal Medicine

## 2015-09-23 ENCOUNTER — Encounter (HOSPITAL_COMMUNITY): Payer: Self-pay

## 2015-09-23 DIAGNOSIS — J180 Bronchopneumonia, unspecified organism: Secondary | ICD-10-CM | POA: Diagnosis not present

## 2015-09-23 DIAGNOSIS — R718 Other abnormality of red blood cells: Secondary | ICD-10-CM | POA: Diagnosis not present

## 2015-09-23 DIAGNOSIS — R011 Cardiac murmur, unspecified: Secondary | ICD-10-CM | POA: Diagnosis present

## 2015-09-23 DIAGNOSIS — E861 Hypovolemia: Secondary | ICD-10-CM | POA: Diagnosis present

## 2015-09-23 DIAGNOSIS — R05 Cough: Secondary | ICD-10-CM | POA: Diagnosis not present

## 2015-09-23 DIAGNOSIS — G9389 Other specified disorders of brain: Secondary | ICD-10-CM | POA: Diagnosis not present

## 2015-09-23 DIAGNOSIS — K5900 Constipation, unspecified: Secondary | ICD-10-CM | POA: Diagnosis present

## 2015-09-23 DIAGNOSIS — F419 Anxiety disorder, unspecified: Secondary | ICD-10-CM | POA: Diagnosis present

## 2015-09-23 DIAGNOSIS — R404 Transient alteration of awareness: Secondary | ICD-10-CM | POA: Diagnosis not present

## 2015-09-23 DIAGNOSIS — R9431 Abnormal electrocardiogram [ECG] [EKG]: Secondary | ICD-10-CM | POA: Diagnosis present

## 2015-09-23 DIAGNOSIS — F429 Obsessive-compulsive disorder, unspecified: Secondary | ICD-10-CM | POA: Diagnosis not present

## 2015-09-23 DIAGNOSIS — J209 Acute bronchitis, unspecified: Secondary | ICD-10-CM | POA: Diagnosis not present

## 2015-09-23 DIAGNOSIS — K219 Gastro-esophageal reflux disease without esophagitis: Secondary | ICD-10-CM | POA: Diagnosis not present

## 2015-09-23 DIAGNOSIS — E871 Hypo-osmolality and hyponatremia: Secondary | ICD-10-CM | POA: Diagnosis present

## 2015-09-23 DIAGNOSIS — R0602 Shortness of breath: Secondary | ICD-10-CM | POA: Diagnosis not present

## 2015-09-23 DIAGNOSIS — Z79899 Other long term (current) drug therapy: Secondary | ICD-10-CM

## 2015-09-23 DIAGNOSIS — R531 Weakness: Secondary | ICD-10-CM | POA: Diagnosis not present

## 2015-09-23 DIAGNOSIS — Y95 Nosocomial condition: Secondary | ICD-10-CM | POA: Diagnosis present

## 2015-09-23 DIAGNOSIS — J189 Pneumonia, unspecified organism: Secondary | ICD-10-CM | POA: Diagnosis present

## 2015-09-23 LAB — CBG MONITORING, ED: Glucose-Capillary: 169 mg/dL — ABNORMAL HIGH (ref 65–99)

## 2015-09-23 LAB — DIFFERENTIAL
Basophils Absolute: 0 10*3/uL (ref 0.0–0.1)
Basophils Relative: 0 %
Eosinophils Absolute: 0 10*3/uL (ref 0.0–0.7)
Eosinophils Relative: 0 %
Lymphocytes Relative: 12 %
Lymphs Abs: 1.1 10*3/uL (ref 0.7–4.0)
Monocytes Absolute: 1.1 10*3/uL — ABNORMAL HIGH (ref 0.1–1.0)
Monocytes Relative: 12 %
Neutro Abs: 6.9 10*3/uL (ref 1.7–7.7)
Neutrophils Relative %: 76 %

## 2015-09-23 LAB — COMPREHENSIVE METABOLIC PANEL
ALT: 22 U/L (ref 17–63)
AST: 23 U/L (ref 15–41)
Albumin: 3.9 g/dL (ref 3.5–5.0)
Alkaline Phosphatase: 85 U/L (ref 38–126)
Anion gap: 10 (ref 5–15)
BUN: 10 mg/dL (ref 6–20)
CO2: 24 mmol/L (ref 22–32)
Calcium: 8.8 mg/dL — ABNORMAL LOW (ref 8.9–10.3)
Chloride: 93 mmol/L — ABNORMAL LOW (ref 101–111)
Creatinine, Ser: 0.88 mg/dL (ref 0.61–1.24)
GFR calc Af Amer: >60 mL/min (ref >60)
GFR calc non Af Amer: >60 mL/min (ref >60)
Glucose, Bld: 180 mg/dL — ABNORMAL HIGH (ref 65–99)
Potassium: 4.3 mmol/L (ref 3.5–5.1)
Sodium: 127 mmol/L — ABNORMAL LOW (ref 135–145)
Total Bilirubin: 1.3 mg/dL — ABNORMAL HIGH (ref 0.3–1.2)
Total Protein: 7.2 g/dL (ref 6.5–8.1)

## 2015-09-23 LAB — RAPID URINE DRUG SCREEN, HOSP PERFORMED
Amphetamines: NOT DETECTED
Barbiturates: NOT DETECTED
Benzodiazepines: POSITIVE — AB
Cocaine: NOT DETECTED
Opiates: NOT DETECTED
Tetrahydrocannabinol: NOT DETECTED

## 2015-09-23 LAB — I-STAT CHEM 8, ED
BUN: 10 mg/dL (ref 6–20)
Calcium, Ion: 1.1 mmol/L — ABNORMAL LOW (ref 1.13–1.30)
Chloride: 91 mmol/L — ABNORMAL LOW (ref 101–111)
Creatinine, Ser: 0.8 mg/dL (ref 0.61–1.24)
Glucose, Bld: 177 mg/dL — ABNORMAL HIGH (ref 65–99)
HCT: 47 % (ref 39.0–52.0)
Hemoglobin: 16 g/dL (ref 13.0–17.0)
Potassium: 4.2 mmol/L (ref 3.5–5.1)
Sodium: 129 mmol/L — ABNORMAL LOW (ref 135–145)
TCO2: 24 mmol/L (ref 0–100)

## 2015-09-23 LAB — CBC
HCT: 43.3 % (ref 39.0–52.0)
Hemoglobin: 14.9 g/dL (ref 13.0–17.0)
MCH: 32.5 pg (ref 26.0–34.0)
MCHC: 34.4 g/dL (ref 30.0–36.0)
MCV: 94.5 fL (ref 78.0–100.0)
Platelets: 171 10*3/uL (ref 150–400)
RBC: 4.58 MIL/uL (ref 4.22–5.81)
RDW: 12.7 % (ref 11.5–15.5)
WBC: 9.2 10*3/uL (ref 4.0–10.5)

## 2015-09-23 LAB — URINALYSIS, ROUTINE W REFLEX MICROSCOPIC
Bilirubin Urine: NEGATIVE
Glucose, UA: NEGATIVE mg/dL
Hgb urine dipstick: NEGATIVE
Ketones, ur: NEGATIVE mg/dL
Leukocytes, UA: NEGATIVE
Nitrite: NEGATIVE
Protein, ur: NEGATIVE mg/dL
Specific Gravity, Urine: 1.008 (ref 1.005–1.030)
pH: 7 (ref 5.0–8.0)

## 2015-09-23 LAB — PROTIME-INR
INR: 1.09 (ref 0.00–1.49)
Prothrombin Time: 14.3 seconds (ref 11.6–15.2)

## 2015-09-23 LAB — I-STAT TROPONIN, ED: Troponin i, poc: 0.01 ng/mL (ref 0.00–0.08)

## 2015-09-23 LAB — BRAIN NATRIURETIC PEPTIDE: B Natriuretic Peptide: 22.1 pg/mL (ref 0.0–100.0)

## 2015-09-23 LAB — APTT: aPTT: 33 seconds (ref 24–37)

## 2015-09-23 LAB — ETHANOL: Alcohol, Ethyl (B): <5 mg/dL (ref <5)

## 2015-09-23 MED ORDER — SALINE SPRAY 0.65 % NA SOLN
1.0000 | Freq: Once | NASAL | Status: AC
  Administered 2015-09-23: 1 via NASAL
  Filled 2015-09-23: qty 44

## 2015-09-23 MED ORDER — PREDNISONE 20 MG PO TABS
60.0000 mg | ORAL_TABLET | Freq: Once | ORAL | Status: AC
  Administered 2015-09-23: 60 mg via ORAL
  Filled 2015-09-23: qty 3

## 2015-09-23 MED ORDER — IPRATROPIUM-ALBUTEROL 0.5-2.5 (3) MG/3ML IN SOLN
3.0000 mL | Freq: Once | RESPIRATORY_TRACT | Status: AC
  Administered 2015-09-23: 3 mL via RESPIRATORY_TRACT
  Filled 2015-09-23: qty 3

## 2015-09-23 MED ORDER — IOHEXOL 350 MG/ML SOLN
100.0000 mL | Freq: Once | INTRAVENOUS | Status: AC | PRN
  Administered 2015-09-23: 100 mL via INTRAVENOUS

## 2015-09-23 MED ORDER — SODIUM CHLORIDE 0.9 % IV BOLUS (SEPSIS)
1000.0000 mL | Freq: Once | INTRAVENOUS | Status: AC
  Administered 2015-09-23: 1000 mL via INTRAVENOUS

## 2015-09-23 NOTE — ED Notes (Signed)
Bed: WA20 Expected date:  Expected time:  Means of arrival:  Comments: Ems low na

## 2015-09-23 NOTE — ED Notes (Signed)
Per EMS, Pt, from [place], c/o increasing weakness since Christmas and abnormal lab value reported today.  Denies pain.  Pt reports hypokalemia, but unsure of lab result.

## 2015-09-23 NOTE — ED Provider Notes (Signed)
CSN: [id number]     Arrival date & time 09/23/15  1736 History   First MD Initiated Contact with Patient 09/23/15 1831     Chief Complaint  Patient presents with  . Abnormal Lab  . Weakness     (Consider location/radiation/quality/duration/timing/severity/associated sxs/prior Treatment) The history is provided by the patient and a relative.    Pt is a 82 y.o. Male who presents to the ER with complaints of extreme fatigue and weakness that began around christmas time, roughly 3 weeks ago.  He traveled to [place] over holidays, was weak but did not have cough or SOB until about 2 weeks ago after flying home.  He has associated nasal congestions and sinus pressure.  He saw his PCP two days ago and was diagnosed with Bronchitis, states he began medicine yesterday, although he does not know what it is.  He was called today by his doctor who told him to go to the ER due to lab abnormalities.  He believes he had low sodium or low potassium.  He denies fevers, but has had occasional sweats.  He has productive cough, sputum green, denies hemoptysis.  He denies wheeze, orthopnea, PND, LE edema, palpitation, CP.  He has exertional SOB which has gradually worsened over the past two weeks.  He denies inspirational CP.  He has generalized weakness, but denies focal weakness.  He denies numbness, tingling, lightheadedness, dizziness, syncope.  His son at the bedside denies confusion, AMS.  Past Medical History  Diagnosis Date  . Hiatal hernia   . Diverticulosis of colon (without mention of hemorrhage)   . Anxiety   . OCD (obsessive compulsive disorder)   . Viral hepatitis 08/1983  . GERD (gastroesophageal reflux disease)   . Esophageal stricture   . Colon polyp     hyperplastic   Past Surgical History  Procedure Laterality Date  . Colonoscopy    . Hemorrhoid surgery      1965   Family History  Problem Relation Age of Onset  . Colon cancer Other     1st cousion.  . Diverticulosis Mother   .  Melanoma Mother   . Lung cancer Father    Social History  Substance Use Topics  . Smoking status: Never Smoker   . Smokeless tobacco: Never Used  . Alcohol Use: 3.5 oz/week    7 Standard drinks or equivalent per week    Review of Systems  Constitutional: Positive for activity change and fatigue. Negative for fever and appetite change.  HENT: Positive for congestion and sinus pressure. Negative for sore throat and trouble swallowing.   Eyes: Negative.   Respiratory: Negative for choking and chest tightness.   Gastrointestinal: Negative.  Negative for nausea, vomiting, abdominal pain, diarrhea and constipation.  Endocrine: Negative.   Genitourinary: Negative.   Musculoskeletal: Negative.   Skin: Negative.  Negative for pallor and rash.  Neurological: Positive for weakness. Negative for dizziness, syncope, facial asymmetry, speech difficulty, light-headedness, numbness and headaches.  Psychiatric/Behavioral: Negative.       Allergies  Bee venom  Home Medications   Prior to Admission medications   Medication Sig Start Date End Date Taking? Authorizing Provider  Cholecalciferol (VITAMIN D) 1000 UNITS capsule Take 1,000 Units by mouth daily.     Yes Historical Provider, MD  fluvoxaMINE (LUVOX) 50 MG tablet Take 100 mg by mouth at bedtime.    Yes Historical Provider, MD  LORazepam (ATIVAN) 1 MG tablet Take 1-2 mg by mouth 2 (two) times daily. Take 1  tablet at noon and 2 tablets at bedtime   Yes Historical Provider, MD  Multiple Vitamins-Minerals (PRESERVISION AREDS) CAPS Take 1 capsule by mouth 2 (two) times daily.   Yes Historical Provider, MD  polyethylene glycol (MIRALAX / GLYCOLAX) packet Take 17 g by mouth daily.     Yes Historical Provider, MD  ranitidine (ZANTAC) 150 MG tablet Take 150 mg by mouth daily.   Yes Historical Provider, MD  sodium chloride (OCEAN) 0.65 % SOLN nasal spray Place 1 spray into both nostrils 3 (three) times daily as needed for congestion.   Yes  Historical Provider, MD  EPINEPHrine (EPI-PEN) 0.3 mg/0.3 mL DEVI Inject 0.3 mg into the muscle once.    Historical Provider, MD  omeprazole (PRILOSEC) 20 MG capsule Take 2- 20 mg capsules twice daily for 2 weeks and then decrease to 1- 20 mg capsule daily as maintenance. Patient not taking: Reported on 09/23/2015 12/29/12   Sable Feil, MD  ondansetron (ZOFRAN-ODT) 4 MG disintegrating tablet Take 1 every 6-8 hours only if needed for nausea or vomiting. Can resolve under tongue. Patient not taking: Reported on 09/23/2015 08/03/15   Posey Boyer, MD   BP 124/74 mmHg  Pulse 95  Temp(Src) 98.5 F (36.9 C) (Oral)  Resp 22  SpO2 92% Physical Exam  Constitutional: He is oriented to person, place, and time. He appears well-developed and well-nourished. No distress.  HENT:  Head: Normocephalic and atraumatic.  Nose: Mucosal edema present. Right sinus exhibits maxillary sinus tenderness. Right sinus exhibits no frontal sinus tenderness. Left sinus exhibits maxillary sinus tenderness. Left sinus exhibits no frontal sinus tenderness.  Mouth/Throat: Uvula is midline. Mucous membranes are not pale, dry and not cyanotic. No oropharyngeal exudate, posterior oropharyngeal edema or posterior oropharyngeal erythema.  Nasal congestion, oral mucosa dry  Eyes: Conjunctivae and EOM are normal. Pupils are equal, round, and reactive to light. Right eye exhibits no discharge. Left eye exhibits no discharge. No scleral icterus.  Neck: Normal range of motion. No JVD present. No tracheal deviation present. No thyromegaly present.  Cardiovascular: Normal rate, regular rhythm and intact distal pulses.  Exam reveals no gallop and no friction rub.   No murmur heard. No LE edema  Pulmonary/Chest: No accessory muscle usage. No respiratory distress. He has decreased breath sounds in the right lower field, the left middle field and the left lower field. He has no wheezes. He has rhonchi. He exhibits no tenderness.   Frequent cough, decreased BS bilaterally at the bases, poor inspiratory effort, scattered rhonchi, no wheeze  Abdominal: Soft. Bowel sounds are normal. He exhibits no distension and no mass. There is no tenderness. There is no rebound and no guarding.  Musculoskeletal: Normal range of motion. He exhibits no edema or tenderness.  Lymphadenopathy:    He has no cervical adenopathy.  Neurological: He is alert and oriented to person, place, and time. He has normal reflexes. No cranial nerve deficit. He exhibits normal muscle tone. Coordination normal.  Speech clear, following 2 step commands Normal strength, symmetrical grip strength, symmetrical dorsal flexion and plantarflexion, normal sensation to light touch, CN grossly intact, no facial droop  Skin: Skin is warm and dry. No rash noted. He is not diaphoretic. No erythema. No pallor.  Psychiatric: He has a normal mood and affect. His behavior is normal. Judgment and thought content normal.  Nursing note and vitals reviewed.   ED Course  Procedures (including critical care time) Labs Review Labs Reviewed  COMPREHENSIVE METABOLIC PANEL - Abnormal; Notable  for the following:    Sodium 127 (*)    Chloride 93 (*)    Glucose, Bld 180 (*)    Calcium 8.8 (*)    Total Bilirubin 1.3 (*)    All other components within normal limits  DIFFERENTIAL - Abnormal; Notable for the following:    Monocytes Absolute 1.1 (*)    All other components within normal limits  URINE RAPID DRUG SCREEN, HOSP PERFORMED - Abnormal; Notable for the following:    Benzodiazepines POSITIVE (*)    All other components within normal limits  I-STAT CHEM 8, ED - Abnormal; Notable for the following:    Sodium 129 (*)    Chloride 91 (*)    Glucose, Bld 177 (*)    Calcium, Ion 1.10 (*)    All other components within normal limits  CBG MONITORING, ED - Abnormal; Notable for the following:    Glucose-Capillary 169 (*)    All other components within normal limits  ETHANOL   PROTIME-INR  APTT  CBC  URINALYSIS, ROUTINE W REFLEX MICROSCOPIC (NOT AT [facility])  BRAIN NATRIURETIC PEPTIDE  I-STAT TROPOININ, ED    Imaging Review Dg Chest 2 View  09/23/2015  CLINICAL DATA:  Acute onset of shortness of breath and cough. Generalized fatigue. Initial encounter. EXAM: CHEST  2 VIEW COMPARISON:  None. FINDINGS: The lungs are well-aerated. Minimal bilateral atelectasis is noted. There is no evidence of pleural effusion or pneumothorax. The heart is normal in size; the mediastinal contour is within normal limits. No acute osseous abnormalities are seen. IMPRESSION: Minimal bilateral atelectasis noted.  Lungs otherwise clear. Electronically Signed   By: Garald Balding M.D.   On: 09/23/2015 20:35   Ct Head Wo Contrast  09/23/2015  CLINICAL DATA:  Extreme fatigue and weakness for several days. EXAM: CT HEAD WITHOUT CONTRAST TECHNIQUE: Contiguous axial images were obtained from the base of the skull through the vertex without intravenous contrast. COMPARISON:  None. FINDINGS: There is no evidence of intracranial hemorrhage, brain edema, or other signs of acute infarction. There is no evidence of intracranial mass lesion or mass effect. No abnormal extraaxial fluid collections are identified. Moderate diffuse cerebral atrophy and chronic small vessel disease are seen. No evidence of obstructive hydrocephalus. No skull abnormality identified. Air-fluid levels are seen in both maxillary sinuses as well as mucosal thickening involving the ethmoid sinuses bilaterally. IMPRESSION: No acute intracranial abnormality. Moderate cerebral atrophy and chronic small vessel disease. Bilateral ethmoid and maxillary sinusitis, with bilateral maxillary air-fluid levels suggesting acute sinusitis. Electronically Signed   By: Earle Gell M.D.   On: 09/23/2015 19:22   Ct Angio Chest Pe W/cm &/or Wo Cm  09/23/2015  CLINICAL DATA:  82 year old male with shortness of breath and weakness EXAM: CT ANGIOGRAPHY CHEST  WITH CONTRAST TECHNIQUE: Multidetector CT imaging of the chest was performed using the standard protocol during bolus administration of intravenous contrast. Multiplanar CT image reconstructions and MIPs were obtained to evaluate the vascular anatomy. CONTRAST:  159mL OMNIPAQUE IOHEXOL 350 MG/ML SOLN COMPARISON:  Chest radiograph dated 07/23/2016 FINDINGS: Small bilateral pleural effusions. There is peribronchial thickening involving the right lower lobe with nodular and ground-glass densities at the right lung base concerning for bronchopneumonia. There is diffuse interstitial prominence which may represent a degree of congestion. No focal consolidation or pneumothorax. The central airways are patent. The thoracic aorta appears unremarkable. No CT evidence of pulmonary embolism. Top-normal subcarinal, right hilar, and paratracheal lymph nodes noted. There is no cardiomegaly. Small pericardial effusion measuring  5 mm in thickness. Correlation with echocardiogram recommended. There is coronary vascular calcification. Small hiatal hernia. The esophagus appears unremarkable. No thyroid nodule identified. There is no axillary adenopathy. The chest wall soft tissues appear unremarkable. There is degenerative changes of the spine. No acute fracture. The visualized upper abdomen appears unremarkable. Review of the MIP images confirms the above findings. IMPRESSION: No CT evidence of pulmonary embolism. Findings likely represent bronchopneumonia predominantly involving the right lung base. Small bilateral pleural effusions. Electronically Signed   By: Anner Crete M.D.   On: 09/23/2015 23:12   I have personally reviewed and evaluated these images and lab results as part of my medical decision-making.   EKG Interpretation None      MDM   Pt with 3 weeks of weakness and malaise, 2 weeks of URI sx including cough and worsening SOB, no orthopnea, but worsening exertional dyspnea  Labs significant for  hyponatremia, hypochloremia, and CT angio to r/o PE shows bilateral pleural effusions, possible bronchopneumonia, small pericardial effusion.  No PE. BNP negative, UA negative, head CT negative.  Troponin negative   [place] to admit to tele for further work up and treatment  Final diagnoses:  None      Delsa Grana, PA-C 09/26/15 0255  Leonard Schwartz, MD 09/28/15 1616

## 2015-09-23 NOTE — ED Notes (Signed)
Pt reports extreme fatigue x several weeks, worse over the past few days.  He called his doctor earlier this week and she told him to drink a lot of water; after doing so, he feels worse so she advised him to come in to the ER due to "very low sodium." He is alert and oriented x 4 and is in no acute distress. Family is at the bedside at this time.

## 2015-09-23 NOTE — ED Notes (Signed)
Patient transported to CT 

## 2015-09-24 ENCOUNTER — Encounter (HOSPITAL_COMMUNITY): Payer: Self-pay | Admitting: Internal Medicine

## 2015-09-24 ENCOUNTER — Inpatient Hospital Stay (HOSPITAL_COMMUNITY): Payer: Medicare Other

## 2015-09-24 DIAGNOSIS — J209 Acute bronchitis, unspecified: Secondary | ICD-10-CM | POA: Diagnosis present

## 2015-09-24 DIAGNOSIS — J189 Pneumonia, unspecified organism: Secondary | ICD-10-CM | POA: Diagnosis not present

## 2015-09-24 DIAGNOSIS — R0602 Shortness of breath: Secondary | ICD-10-CM | POA: Diagnosis not present

## 2015-09-24 DIAGNOSIS — R011 Cardiac murmur, unspecified: Secondary | ICD-10-CM | POA: Diagnosis not present

## 2015-09-24 DIAGNOSIS — F429 Obsessive-compulsive disorder, unspecified: Secondary | ICD-10-CM | POA: Diagnosis present

## 2015-09-24 DIAGNOSIS — R9431 Abnormal electrocardiogram [ECG] [EKG]: Secondary | ICD-10-CM | POA: Diagnosis not present

## 2015-09-24 DIAGNOSIS — E871 Hypo-osmolality and hyponatremia: Secondary | ICD-10-CM | POA: Diagnosis present

## 2015-09-24 DIAGNOSIS — Y95 Nosocomial condition: Secondary | ICD-10-CM | POA: Diagnosis present

## 2015-09-24 DIAGNOSIS — K219 Gastro-esophageal reflux disease without esophagitis: Secondary | ICD-10-CM | POA: Diagnosis present

## 2015-09-24 DIAGNOSIS — F4489 Other dissociative and conversion disorders: Secondary | ICD-10-CM | POA: Diagnosis not present

## 2015-09-24 DIAGNOSIS — E861 Hypovolemia: Secondary | ICD-10-CM | POA: Diagnosis present

## 2015-09-24 DIAGNOSIS — Z79899 Other long term (current) drug therapy: Secondary | ICD-10-CM | POA: Diagnosis not present

## 2015-09-24 DIAGNOSIS — J180 Bronchopneumonia, unspecified organism: Principal | ICD-10-CM

## 2015-09-24 DIAGNOSIS — F419 Anxiety disorder, unspecified: Secondary | ICD-10-CM | POA: Diagnosis present

## 2015-09-24 DIAGNOSIS — K5900 Constipation, unspecified: Secondary | ICD-10-CM

## 2015-09-24 LAB — EXPECTORATED SPUTUM ASSESSMENT W GRAM STAIN, RFLX TO RESP C: Special Requests: NORMAL

## 2015-09-24 LAB — CBC
HCT: 40.9 % (ref 39.0–52.0)
Hemoglobin: 14.1 g/dL (ref 13.0–17.0)
MCH: 32 pg (ref 26.0–34.0)
MCHC: 34.5 g/dL (ref 30.0–36.0)
MCV: 93 fL (ref 78.0–100.0)
Platelets: 181 10*3/uL (ref 150–400)
RBC: 4.4 MIL/uL (ref 4.22–5.81)
RDW: 12.7 % (ref 11.5–15.5)
WBC: 10.3 10*3/uL (ref 4.0–10.5)

## 2015-09-24 LAB — PHOSPHORUS: Phosphorus: 3.6 mg/dL (ref 2.5–4.6)

## 2015-09-24 LAB — MAGNESIUM: Magnesium: 2 mg/dL (ref 1.7–2.4)

## 2015-09-24 LAB — COMPREHENSIVE METABOLIC PANEL
ALT: 20 U/L (ref 17–63)
AST: 20 U/L (ref 15–41)
Albumin: 3.5 g/dL (ref 3.5–5.0)
Alkaline Phosphatase: 79 U/L (ref 38–126)
Anion gap: 10 (ref 5–15)
BUN: 13 mg/dL (ref 6–20)
CO2: 22 mmol/L (ref 22–32)
Calcium: 8.8 mg/dL — ABNORMAL LOW (ref 8.9–10.3)
Chloride: 99 mmol/L — ABNORMAL LOW (ref 101–111)
Creatinine, Ser: 0.91 mg/dL (ref 0.61–1.24)
GFR calc Af Amer: >60 mL/min (ref >60)
GFR calc non Af Amer: >60 mL/min (ref >60)
Glucose, Bld: 170 mg/dL — ABNORMAL HIGH (ref 65–99)
Potassium: 4.6 mmol/L (ref 3.5–5.1)
Sodium: 131 mmol/L — ABNORMAL LOW (ref 135–145)
Total Bilirubin: 1.8 mg/dL — ABNORMAL HIGH (ref 0.3–1.2)
Total Protein: 7 g/dL (ref 6.5–8.1)

## 2015-09-24 LAB — TROPONIN I: Troponin I: <0.03 ng/mL (ref <0.031)

## 2015-09-24 LAB — STREP PNEUMONIAE URINARY ANTIGEN: Strep Pneumo Urinary Antigen: NEGATIVE

## 2015-09-24 LAB — TSH: TSH: 0.618 u[IU]/mL (ref 0.350–4.500)

## 2015-09-24 LAB — SODIUM, URINE, RANDOM: Sodium, Ur: 33 mmol/L

## 2015-09-24 LAB — EXPECTORATED SPUTUM ASSESSMENT W REFEX TO RESP CULTURE

## 2015-09-24 LAB — CREATININE, URINE, RANDOM: Creatinine, Urine: 68.82 mg/dL

## 2015-09-24 LAB — MRSA PCR SCREENING: MRSA by PCR: NEGATIVE

## 2015-09-24 LAB — INFLUENZA PANEL BY PCR (TYPE A & B)
H1N1 flu by pcr: NOT DETECTED
Influenza A By PCR: NEGATIVE
Influenza B By PCR: NEGATIVE

## 2015-09-24 LAB — OSMOLALITY, URINE: Osmolality, Ur: 361 mOsm/kg (ref 300–900)

## 2015-09-24 MED ORDER — ONDANSETRON HCL 4 MG/2ML IJ SOLN: 4.0000 mg | Freq: Four times a day (QID) | INTRAMUSCULAR | Status: DC | PRN

## 2015-09-24 MED ORDER — FAMOTIDINE 20 MG PO TABS
10.0000 mg | ORAL_TABLET | Freq: Every day | ORAL | Status: DC
  Administered 2015-09-24 – 2015-09-26 (×3): 10 mg via ORAL
  Filled 2015-09-24 (×3): qty 1

## 2015-09-24 MED ORDER — HYDROCODONE-ACETAMINOPHEN 5-325 MG PO TABS: 1.0000 | ORAL_TABLET | ORAL | Status: DC | PRN

## 2015-09-24 MED ORDER — ONDANSETRON HCL 4 MG PO TABS
4.0000 mg | ORAL_TABLET | Freq: Four times a day (QID) | ORAL | Status: DC | PRN
Start: 2015-09-24 — End: 2015-09-26

## 2015-09-24 MED ORDER — DOCUSATE SODIUM 100 MG PO CAPS
100.0000 mg | ORAL_CAPSULE | Freq: Two times a day (BID) | ORAL | Status: DC
  Administered 2015-09-24 (×2): 100 mg via ORAL
  Filled 2015-09-24 (×2): qty 1

## 2015-09-24 MED ORDER — SODIUM CHLORIDE 0.9 % IV SOLN
500.0000 mg | Freq: Two times a day (BID) | INTRAVENOUS | Status: DC
  Administered 2015-09-24 – 2015-09-25 (×3): 500 mg via INTRAVENOUS
  Filled 2015-09-24 (×4): qty 500

## 2015-09-24 MED ORDER — POLYETHYLENE GLYCOL 3350 17 G PO PACK
17.0000 g | PACK | Freq: Every day | ORAL | Status: DC
  Administered 2015-09-24: 17 g via ORAL
  Filled 2015-09-24 (×3): qty 1

## 2015-09-24 MED ORDER — DEXTROSE 5 % IV SOLN
1.0000 g | Freq: Three times a day (TID) | INTRAVENOUS | Status: DC
  Administered 2015-09-24 – 2015-09-25 (×4): 1 g via INTRAVENOUS
  Filled 2015-09-24 (×6): qty 1

## 2015-09-24 MED ORDER — SENNA 8.6 MG PO TABS
1.0000 | ORAL_TABLET | Freq: Two times a day (BID) | ORAL | Status: DC
  Administered 2015-09-24 (×2): 8.6 mg via ORAL
  Filled 2015-09-24 (×2): qty 1

## 2015-09-24 MED ORDER — IPRATROPIUM-ALBUTEROL 0.5-2.5 (3) MG/3ML IN SOLN
3.0000 mL | Freq: Once | RESPIRATORY_TRACT | Status: AC
  Administered 2015-09-24: 3 mL via RESPIRATORY_TRACT
  Filled 2015-09-24: qty 3

## 2015-09-24 MED ORDER — DEXTROSE 5 % IV SOLN
500.0000 mg | INTRAVENOUS | Status: DC
  Administered 2015-09-24 – 2015-09-26 (×3): 500 mg via INTRAVENOUS
  Filled 2015-09-24 (×3): qty 500

## 2015-09-24 MED ORDER — SODIUM CHLORIDE 0.9 % IV SOLN
INTRAVENOUS | Status: AC
  Administered 2015-09-24 (×2): via INTRAVENOUS

## 2015-09-24 MED ORDER — ENOXAPARIN SODIUM 40 MG/0.4ML ~~LOC~~ SOLN
40.0000 mg | SUBCUTANEOUS | Status: DC
  Administered 2015-09-24 – 2015-09-26 (×3): 40 mg via SUBCUTANEOUS
  Filled 2015-09-24 (×4): qty 0.4

## 2015-09-24 MED ORDER — ONDANSETRON HCL 4 MG/2ML IJ SOLN: 4.0000 mg | Freq: Three times a day (TID) | INTRAMUSCULAR | Status: DC | PRN

## 2015-09-24 MED ORDER — ACETAMINOPHEN 325 MG PO TABS: 650.0000 mg | ORAL_TABLET | Freq: Four times a day (QID) | ORAL | Status: DC | PRN

## 2015-09-24 MED ORDER — ALBUTEROL SULFATE (2.5 MG/3ML) 0.083% IN NEBU: 2.5000 mg | INHALATION_SOLUTION | RESPIRATORY_TRACT | Status: AC | PRN

## 2015-09-24 MED ORDER — SALINE SPRAY 0.65 % NA SOLN: 1.0000 | NASAL | Status: DC | PRN

## 2015-09-24 MED ORDER — GUAIFENESIN ER 600 MG PO TB12
600.0000 mg | ORAL_TABLET | Freq: Two times a day (BID) | ORAL | Status: DC
  Administered 2015-09-24 – 2015-09-26 (×5): 600 mg via ORAL
  Filled 2015-09-24 (×5): qty 1

## 2015-09-24 MED ORDER — LORAZEPAM 1 MG PO TABS
1.0000 mg | ORAL_TABLET | Freq: Two times a day (BID) | ORAL | Status: DC
  Administered 2015-09-24: 1 mg via ORAL
  Administered 2015-09-24: 2 mg via ORAL
  Administered 2015-09-24 – 2015-09-25 (×2): 1 mg via ORAL
  Administered 2015-09-25 – 2015-09-26 (×2): 2 mg via ORAL
  Filled 2015-09-24: qty 2
  Filled 2015-09-24 (×3): qty 1
  Filled 2015-09-24 (×2): qty 2

## 2015-09-24 MED ORDER — ACETAMINOPHEN 650 MG RE SUPP: 650.0000 mg | Freq: Four times a day (QID) | RECTAL | Status: DC | PRN

## 2015-09-24 MED ORDER — SODIUM CHLORIDE 0.9 % IJ SOLN
3.0000 mL | Freq: Two times a day (BID) | INTRAMUSCULAR | Status: DC
  Administered 2015-09-24 – 2015-09-26 (×5): 3 mL via INTRAVENOUS

## 2015-09-24 MED ORDER — ALBUTEROL SULFATE (2.5 MG/3ML) 0.083% IN NEBU: 2.5000 mg | INHALATION_SOLUTION | RESPIRATORY_TRACT | Status: DC | PRN

## 2015-09-24 NOTE — Progress Notes (Signed)
Echocardiogram 2D Echocardiogram has been performed.  Aggie Cosier 09/24/2015, 3:21 PM

## 2015-09-24 NOTE — Progress Notes (Signed)
ANTIBIOTIC CONSULT NOTE - INITIAL  Pharmacy Consult for Vancomycin/Cefepime/Zmax Indication: PNA  Allergies  Allergen Reactions  . Bee Venom Anaphylaxis    Patient Measurements:   Wt69 kg  Vital Signs: Temp: 98.5 F (36.9 C) (01/13 1850) BP: 105/70 mmHg (01/14 0218) Pulse Rate: 86 (01/14 0218) Intake/Output from previous day:   Intake/Output from this shift:    Labs:  Recent Labs  09/23/15 1932 09/23/15 1956  WBC 9.2  --   HGB 14.9 16.0  PLT 171  --   CREATININE 0.88 0.80   CrCl cannot be calculated (Unknown ideal weight.). No results for input(s): VANCOTROUGH, VANCOPEAK, VANCORANDOM, GENTTROUGH, GENTPEAK, GENTRANDOM, TOBRATROUGH, TOBRAPEAK, TOBRARND, AMIKACINPEAK, AMIKACINTROU, AMIKACIN in the last 72 hours.   Microbiology: No results found for this or any previous visit (from the past 720 hour(s)).  Medical History: Past Medical History  Diagnosis Date  . Hiatal hernia   . Diverticulosis of colon (without mention of hemorrhage)   . Anxiety   . OCD (obsessive compulsive disorder)   . Viral hepatitis 08/1983  . GERD (gastroesophageal reflux disease)   . Esophageal stricture   . Colon polyp     hyperplastic    Medications:   (Not in a hospital admission) Scheduled:  . docusate sodium  100 mg Oral BID  . enoxaparin (LOVENOX) injection  40 mg Subcutaneous Q24H  . famotidine  10 mg Oral Daily  . guaiFENesin  600 mg Oral BID  . ipratropium-albuterol  3 mL Nebulization Once  . LORazepam  1-2 mg Oral BID  . polyethylene glycol  17 g Oral Daily  . senna  1 tablet Oral BID  . sodium chloride  3 mL Intravenous Q12H   Infusions:  . sodium chloride    . azithromycin    . ceFEPime (MAXIPIME) IV    . vancomycin     Assessment: 60 yoM presents to ED with abnormal labs and prolonged hx of cough.  Vancomycin per Rx.    Goal of Therapy:  Vancomycin trough level 15-20 mcg/ml  Plan:   Vancomycin 500mg  IV q12h  Cefepime 1Gm IV q8h  Zmax 500mg  IV  q24h  F/u SCr/cultures/levels  Lawana Pai R 09/24/2015,5:53 AM

## 2015-09-24 NOTE — ED Notes (Signed)
Pt is resting comfortably watching tv with his son at the bedside.  He is awake and alert and denies pain at this time.  No acute distress and no needs expressed.

## 2015-09-24 NOTE — H&P (Signed)
PCP:  Gennette Pac, MD  GI Dr. Henrene Pastor  Referring provider Lucio Edward   Chief Complaint: Abnormal labs HPI: Adam Swanson. is a 82 y.o. male   has a past medical history of Hiatal hernia; Diverticulosis of colon (without mention of hemorrhage); Anxiety; OCD (obsessive compulsive disorder); Viral hepatitis (08/1983); GERD (gastroesophageal reflux disease); Esophageal stricture; and Colon polyp.   Presented with abnormal labs from [facility] home independent living has been having worsening fatigue ever since Christmas. He reports cough for almost 3 months with mucus production. No fever or chills. No chest pain. No confusion. Has been having decreased by mouth intake. Fatigue improved after IV fluid rehydration He was diagnosed at his PCP with acute bronchitis.  Patient apparently was seen by M.D. who obtain labs and told him he has low-sodium. EMS was called and patient was brought in to emergency department in ER sodium was noted to be 129. CT scan of the chest showed bronchopneumonia  Hospitalist was called for admission for hyponatremia bronchopneumonia  Review of Systems:    Pertinent positives include: excess mucus, productive cough  Constitutional:  No weight loss, night sweats, Fevers, chills, fatigue, weight loss  HEENT:  No headaches, Difficulty swallowing,Tooth/dental problems,Sore throat,  No sneezing, itching, ear ache, nasal congestion, post nasal drip,  Cardio-vascular:  No chest pain, Orthopnea, PND, anasarca, dizziness, palpitations.no Bilateral lower extremity swelling  GI:  No heartburn, indigestion, abdominal pain, nausea, vomiting, diarrhea, change in bowel habits, loss of appetite, melena, blood in stool, hematemesis Resp:  no shortness of breath at rest. No dyspnea on exertion,   No non-productive cough, No coughing up of blood.No change in color of mucus.No wheezing. Skin:  no rash or lesions. No jaundice GU:  no dysuria, change in color of urine, no  urgency or frequency. No straining to urinate.  No flank pain.  Musculoskeletal:  No joint pain or no joint swelling. No decreased range of motion. No back pain.  Psych:  No change in mood or affect. No depression or anxiety. No memory loss.  Neuro: no localizing neurological complaints, no tingling, no weakness, no double vision, no gait abnormality, no slurred speech, no confusion  Otherwise ROS are negative except for above, 10 systems were reviewed  Past Medical History: Past Medical History  Diagnosis Date  . Hiatal hernia   . Diverticulosis of colon (without mention of hemorrhage)   . Anxiety   . OCD (obsessive compulsive disorder)   . Viral hepatitis 08/1983  . GERD (gastroesophageal reflux disease)   . Esophageal stricture   . Colon polyp     hyperplastic   Past Surgical History  Procedure Laterality Date  . Colonoscopy    . Hemorrhoid surgery      1965     Medications: Prior to Admission medications   Medication Sig Start Date End Date Taking? Authorizing Provider  Cholecalciferol (VITAMIN D) 1000 UNITS capsule Take 1,000 Units by mouth daily.     Yes Historical Provider, MD  fluvoxaMINE (LUVOX) 50 MG tablet Take 100 mg by mouth at bedtime.    Yes Historical Provider, MD  LORazepam (ATIVAN) 1 MG tablet Take 1-2 mg by mouth 2 (two) times daily. Take 1 tablet at noon and 2 tablets at bedtime   Yes Historical Provider, MD  Multiple Vitamins-Minerals (PRESERVISION AREDS) CAPS Take 1 capsule by mouth 2 (two) times daily.   Yes Historical Provider, MD  polyethylene glycol (MIRALAX / GLYCOLAX) packet Take 17 g by mouth daily.  Yes Historical Provider, MD  ranitidine (ZANTAC) 150 MG tablet Take 150 mg by mouth daily.   Yes Historical Provider, MD  sodium chloride (OCEAN) 0.65 % SOLN nasal spray Place 1 spray into both nostrils 3 (three) times daily as needed for congestion.   Yes Historical Provider, MD  EPINEPHrine (EPI-PEN) 0.3 mg/0.3 mL DEVI Inject 0.3 mg into the  muscle once.    Historical Provider, MD  omeprazole (PRILOSEC) 20 MG capsule Take 2- 20 mg capsules twice daily for 2 weeks and then decrease to 1- 20 mg capsule daily as maintenance. Patient not taking: Reported on 09/23/2015 12/29/12   Sable Feil, MD  ondansetron (ZOFRAN-ODT) 4 MG disintegrating tablet Take 1 every 6-8 hours only if needed for nausea or vomiting. Can resolve under tongue. Patient not taking: Reported on 09/23/2015 08/03/15   Posey Boyer, MD    Allergies:   Allergies  Allergen Reactions  . Bee Venom Anaphylaxis    Social History:  Ambulatory   independently     From facility  [facility]   reports that he has never smoked. He has never used smokeless tobacco. He reports that he drinks about 3.5 oz of alcohol per week. He reports that he does not use illicit drugs.    Family History: family history includes Colon cancer in his other; Diverticulosis in his mother; Lung cancer in his father; Melanoma in his mother.    Physical Exam: Patient Vitals for the past 24 hrs:  BP Temp Temp src Pulse Resp SpO2  09/23/15 2345 124/74 mmHg - - 95 22 92 %  09/23/15 2120 - - - - - 96 %  09/23/15 2000 145/77 mmHg - - 98 21 97 %  09/23/15 1850 - 98.5 F (36.9 C) - - - -  09/23/15 1750 140/86 mmHg 98.5 F (36.9 C) Oral 79 19 95 %    1. General:  in No Acute distress 2. Psychological: Alert and  Oriented 3. Head/ENT:    Dry Mucous Membranes                          Head Non traumatic, neck supple                          Normal  Dentition 4. SKIN:  decreased Skin turgor,  Skin clean Dry and intact no rash 5. Heart: Regular rate and rhythm mild systolic Murmur present, Rub or gallop 6. Lungs:  no wheezes some crackles  decreased breath sounds at the bases 7. Abdomen: Soft, non-tender, Non distended 8. Lower extremities: no clubbing, cyanosis, or edema 9. Neurologically Grossly intact, moving all 4 extremities equally 10. MSK: Normal range of motion  body mass  index is unknown because there is no weight on file.   Labs on Admission:   Results for orders placed or performed during the hospital encounter of 09/23/15 (from the past 24 hour(s))  Urine rapid drug screen (hosp performed)not at [facility]     Status: Abnormal   Collection Time: 09/23/15  6:43 PM  Result Value Ref Range   Opiates NONE DETECTED NONE DETECTED   Cocaine NONE DETECTED NONE DETECTED   Benzodiazepines POSITIVE (A) NONE DETECTED   Amphetamines NONE DETECTED NONE DETECTED   Tetrahydrocannabinol NONE DETECTED NONE DETECTED   Barbiturates NONE DETECTED NONE DETECTED  Urinalysis, Routine w reflex microscopic (not at [facility])     Status: None   Collection Time:  09/23/15  6:43 PM  Result Value Ref Range   Color, Urine YELLOW YELLOW   APPearance CLEAR CLEAR   Specific Gravity, Urine 1.008 1.005 - 1.030   pH 7.0 5.0 - 8.0   Glucose, UA NEGATIVE NEGATIVE mg/dL   Hgb urine dipstick NEGATIVE NEGATIVE   Bilirubin Urine NEGATIVE NEGATIVE   Ketones, ur NEGATIVE NEGATIVE mg/dL   Protein, ur NEGATIVE NEGATIVE mg/dL   Nitrite NEGATIVE NEGATIVE   Leukocytes, UA NEGATIVE NEGATIVE  Comprehensive metabolic panel     Status: Abnormal   Collection Time: 09/23/15  7:32 PM  Result Value Ref Range   Sodium 127 (L) 135 - 145 mmol/L   Potassium 4.3 3.5 - 5.1 mmol/L   Chloride 93 (L) 101 - 111 mmol/L   CO2 24 22 - 32 mmol/L   Glucose, Bld 180 (H) 65 - 99 mg/dL   BUN 10 6 - 20 mg/dL   Creatinine, Ser 0.88 0.61 - 1.24 mg/dL   Calcium 8.8 (L) 8.9 - 10.3 mg/dL   Total Protein 7.2 6.5 - 8.1 g/dL   Albumin 3.9 3.5 - 5.0 g/dL   AST 23 15 - 41 U/L   ALT 22 17 - 63 U/L   Alkaline Phosphatase 85 38 - 126 U/L   Total Bilirubin 1.3 (H) 0.3 - 1.2 mg/dL   GFR calc non Af Amer >60 >60 mL/min   GFR calc Af Amer >60 >60 mL/min   Anion gap 10 5 - 15  Protime-INR     Status: None   Collection Time: 09/23/15  7:32 PM  Result Value Ref Range   Prothrombin Time 14.3 11.6 - 15.2 seconds   INR 1.09 0.00 - 1.49    APTT     Status: None   Collection Time: 09/23/15  7:32 PM  Result Value Ref Range   aPTT 33 24 - 37 seconds  CBC     Status: None   Collection Time: 09/23/15  7:32 PM  Result Value Ref Range   WBC 9.2 4.0 - 10.5 K/uL   RBC 4.58 4.22 - 5.81 MIL/uL   Hemoglobin 14.9 13.0 - 17.0 g/dL   HCT 43.3 39.0 - 52.0 %   MCV 94.5 78.0 - 100.0 fL   MCH 32.5 26.0 - 34.0 pg   MCHC 34.4 30.0 - 36.0 g/dL   RDW 12.7 11.5 - 15.5 %   Platelets 171 150 - 400 K/uL  Differential     Status: Abnormal   Collection Time: 09/23/15  7:32 PM  Result Value Ref Range   Neutrophils Relative % 76 %   Neutro Abs 6.9 1.7 - 7.7 K/uL   Lymphocytes Relative 12 %   Lymphs Abs 1.1 0.7 - 4.0 K/uL   Monocytes Relative 12 %   Monocytes Absolute 1.1 (H) 0.1 - 1.0 K/uL   Eosinophils Relative 0 %   Eosinophils Absolute 0.0 0.0 - 0.7 K/uL   Basophils Relative 0 %   Basophils Absolute 0.0 0.0 - 0.1 K/uL  Brain natriuretic peptide     Status: None   Collection Time: 09/23/15  7:32 PM  Result Value Ref Range   B Natriuretic Peptide 22.1 0.0 - 100.0 pg/mL  Ethanol     Status: None   Collection Time: 09/23/15  7:34 PM  Result Value Ref Range   Alcohol, Ethyl (B) <5 <5 mg/dL  I-Stat Chem 8, ED  (not at [facility])     Status: Abnormal   Collection Time: 09/23/15  7:56 PM  Result  Value Ref Range   Sodium 129 (L) 135 - 145 mmol/L   Potassium 4.2 3.5 - 5.1 mmol/L   Chloride 91 (L) 101 - 111 mmol/L   BUN 10 6 - 20 mg/dL   Creatinine, Ser 0.80 0.61 - 1.24 mg/dL   Glucose, Bld 177 (H) 65 - 99 mg/dL   Calcium, Ion 1.10 (L) 1.13 - 1.30 mmol/L   TCO2 24 0 - 100 mmol/L   Hemoglobin 16.0 13.0 - 17.0 g/dL   HCT 47.0 39.0 - 52.0 %  I-stat troponin, ED     Status: None   Collection Time: 09/23/15  7:57 PM  Result Value Ref Range   Troponin i, poc 0.01 0.00 - 0.08 ng/mL   Comment 3          CBG monitoring, ED     Status: Abnormal   Collection Time: 09/23/15  8:11 PM  Result Value Ref Range   Glucose-Capillary 169 (H) 65 - 99  mg/dL   Comment 1 Notify RN    Comment 2 Document in Chart     UA no evidence of infection.   No results found for: HGBA1C  CrCl cannot be calculated (Unknown ideal weight.).  BNP (last 3 results) No results for input(s): PROBNP in the last 8760 hours.  Other results:  I have pearsonaly reviewed this: ECG REPORT  Rate:77  Rhythm: sinus rhythm, left anterior fascicular block ST&T Change: Non specific ST segment changes no old ECG  QTC 421   There were no vitals filed for this visit.   Cultures: No results found for: [place], [place], CULT, REPTSTATUS   Radiological Exams on Admission: Dg Chest 2 View  09/23/2015  CLINICAL DATA:  Acute onset of shortness of breath and cough. Generalized fatigue. Initial encounter. EXAM: CHEST  2 VIEW COMPARISON:  None. FINDINGS: The lungs are well-aerated. Minimal bilateral atelectasis is noted. There is no evidence of pleural effusion or pneumothorax. The heart is normal in size; the mediastinal contour is within normal limits. No acute osseous abnormalities are seen. IMPRESSION: Minimal bilateral atelectasis noted.  Lungs otherwise clear. Electronically Signed   By: Garald Balding M.D.   On: 09/23/2015 20:35   Ct Head Wo Contrast  09/23/2015  CLINICAL DATA:  Extreme fatigue and weakness for several days. EXAM: CT HEAD WITHOUT CONTRAST TECHNIQUE: Contiguous axial images were obtained from the base of the skull through the vertex without intravenous contrast. COMPARISON:  None. FINDINGS: There is no evidence of intracranial hemorrhage, brain edema, or other signs of acute infarction. There is no evidence of intracranial mass lesion or mass effect. No abnormal extraaxial fluid collections are identified. Moderate diffuse cerebral atrophy and chronic small vessel disease are seen. No evidence of obstructive hydrocephalus. No skull abnormality identified. Air-fluid levels are seen in both maxillary sinuses as well as mucosal thickening involving the  ethmoid sinuses bilaterally. IMPRESSION: No acute intracranial abnormality. Moderate cerebral atrophy and chronic small vessel disease. Bilateral ethmoid and maxillary sinusitis, with bilateral maxillary air-fluid levels suggesting acute sinusitis. Electronically Signed   By: Earle Gell M.D.   On: 09/23/2015 19:22   Ct Angio Chest Pe W/cm &/or Wo Cm  09/23/2015  CLINICAL DATA:  82 year old male with shortness of breath and weakness EXAM: CT ANGIOGRAPHY CHEST WITH CONTRAST TECHNIQUE: Multidetector CT imaging of the chest was performed using the standard protocol during bolus administration of intravenous contrast. Multiplanar CT image reconstructions and MIPs were obtained to evaluate the vascular anatomy. CONTRAST:  168mL OMNIPAQUE IOHEXOL 350 MG/ML  SOLN COMPARISON:  Chest radiograph dated 07/23/2016 FINDINGS: Small bilateral pleural effusions. There is peribronchial thickening involving the right lower lobe with nodular and ground-glass densities at the right lung base concerning for bronchopneumonia. There is diffuse interstitial prominence which may represent a degree of congestion. No focal consolidation or pneumothorax. The central airways are patent. The thoracic aorta appears unremarkable. No CT evidence of pulmonary embolism. Top-normal subcarinal, right hilar, and paratracheal lymph nodes noted. There is no cardiomegaly. Small pericardial effusion measuring 5 mm in thickness. Correlation with echocardiogram recommended. There is coronary vascular calcification. Small hiatal hernia. The esophagus appears unremarkable. No thyroid nodule identified. There is no axillary adenopathy. The chest wall soft tissues appear unremarkable. There is degenerative changes of the spine. No acute fracture. The visualized upper abdomen appears unremarkable. Review of the MIP images confirms the above findings. IMPRESSION: No CT evidence of pulmonary embolism. Findings likely represent bronchopneumonia predominantly  involving the right lung base. Small bilateral pleural effusions. Electronically Signed   By: Anner Crete M.D.   On: 09/23/2015 23:12    Chart has been reviewed  Family not  at  Bedside  plan of care was discussed with  Netta Cedars Blunck [phone] Assessment/Plan  82 year old gentleman with history of anxiety here with abnormal lab values and prolonged history of cough was found to have bronchopneumonia and sodium down to 129 asymptomatic   Present on Admission:  . Bronchopneumonia  HCAP (healthcare-associated pneumonia) -  - will admit for treatment of HCAP will start on appropriate antibiotic coverage. Add atypical coverage   Obtain sputum cultures, blood cultures if febrile or if decompensates.  Provide oxygen as needed.  . Constipation continue home medications  . Hyponatremia will stop Luvox. Treat underlying pneumonia. Rehydrate and monitor sodium we'll check urine electrolytes and TSH  . Abnormal ECG will admit to telemetry repeat EKG in the morning troponin unremarkable no chest pain or shortness of breath associated this   . Heart murmur - obtain echogram    Prophylaxis:   Lovenox   CODE STATUS:  FULL CODE as per patient    Disposition:                            Back to current facility when stable                      Other plan as per orders.  I have spent a total of 56 min on this admission  Hadassa Cermak 09/24/2015, 1:47 AM  [facility]  Pager [phone]   after 2 AM please page floor coverage PA If 7AM-7PM, please contact the day team taking care of the patient  [email or web address]  Password [email or web address]

## 2015-09-24 NOTE — Progress Notes (Signed)
[facility] PROGRESS NOTE  Adam Swanson. [id number] DOB: 07-17-1928 DOA: 09/23/2015  PCP: Gennette Pac, MD  Brief HPI: 82 year old Caucasian male with a past medical history of anxiety disorder who lives in an independent living facility presented with worsening fatigue, cough, ongoing for the past many days. He was recently seen by his primary care physician. He was diagnosed with acute bronchitis. He was asked to keep himself well-hydrated. Labs were checked subsequently, and he was noted to have low sodium. Patient was sent over to the emergency department. Evaluation revealed pneumonia as well as hyponatremia. He was subsequently hospitalized.  Past medical history:  Past Medical History  Diagnosis Date  . Hiatal hernia   . Diverticulosis of colon (without mention of hemorrhage)   . Anxiety   . OCD (obsessive compulsive disorder)   . Viral hepatitis 08/1983  . GERD (gastroesophageal reflux disease)   . Esophageal stricture   . Colon polyp     hyperplastic    Consultants: None  Procedures: None  Antibiotics: Vancomycin, cefepime and azithromycin 1/14  Subjective: Patient feels about the same. Denies any significant improvement in his cough. His cough has mainly white expectoration with occasional yellow. Denies any blood in the sputum. No chest pain. No shortness of breath. No nausea or vomiting.  Objective: Vital Signs  Filed Vitals:   09/24/15 0922 09/24/15 1235 09/24/15 1236 09/24/15 1237  BP: 106/71 114/67 102/52 106/60  Pulse: 76 77 71 73  Temp: 98.1 F (36.7 C)     TempSrc: Oral     Resp: 18     Height: 5\' 7"  (1.702 m)     Weight: 66.6 kg (146 lb 13.2 oz)     SpO2: 96%       Intake/Output Summary (Last 24 hours) at 09/24/15 1304 Last data filed at 09/24/15 1241  Gross per 24 hour  Intake    360 ml  Output    800 ml  Net   -440 ml   Filed Weights   09/24/15 0922  Weight: 66.6 kg (146 lb 13.2 oz)    General appearance: alert,  cooperative, appears stated age and no distress Resp: Course breath sounds bilaterally with a few crackles in the right side. No obvious wheezing or rhonchi. Cardio: regular rate and rhythm, S1, S2 normal, systolic murmur appreciated over the precordium GI: soft, non-tender; bowel sounds normal; no masses,  no organomegaly Extremities: extremities normal, atraumatic, no cyanosis or edema Pulses: 2+ and symmetric Neurologic: No focal deficits  Lab Results:  Basic Metabolic Panel:  Recent Labs Lab 09/23/15 1932 09/23/15 1956 09/24/15 0705  NA 127* 129* 131*  K 4.3 4.2 4.6  CL 93* 91* 99*  CO2 24  --  22  GLUCOSE 180* 177* 170*  BUN 10 10 13   CREATININE 0.88 0.80 0.91  CALCIUM 8.8*  --  8.8*  MG  --   --  2.0  PHOS  --   --  3.6   Liver Function Tests:  Recent Labs Lab 09/23/15 1932 09/24/15 0705  AST 23 20  ALT 22 20  ALKPHOS 85 79  BILITOT 1.3* 1.8*  PROT 7.2 7.0  ALBUMIN 3.9 3.5   CBC:  Recent Labs Lab 09/23/15 1932 09/23/15 1956 09/24/15 0705  WBC 9.2  --  10.3  NEUTROABS 6.9  --   --   HGB 14.9 16.0 14.1  HCT 43.3 47.0 40.9  MCV 94.5  --  93.0  PLT 171  --  181  Cardiac Enzymes:  Recent Labs Lab 09/24/15 0705  TROPONINI <0.03   BNP (last 3 results)  Recent Labs  09/23/15 1932  BNP 22.1    CBG:  Recent Labs Lab 09/23/15 2011  GLUCAP 169*    Recent Results (from the past 240 hour(s))  Culture, blood (routine x 2) Call MD if unable to obtain prior to antibiotics being given     Status: None (Preliminary result)   Collection Time: 09/24/15  6:45 AM  Result Value Ref Range Status   Specimen Description   Final    BLOOD LEFT ANTECUBITAL Performed at [facility]  Final   Culture PENDING  Incomplete   Report Status PENDING  Incomplete  Culture, blood (routine x 2) Call MD if unable to obtain prior to antibiotics being given     Status: None (Preliminary result)    Collection Time: 09/24/15  7:05 AM  Result Value Ref Range Status   Specimen Description   Final    BLOOD RIGHT FOREARM Performed at [facility]    Special Requests BOTTLES DRAWN AEROBIC AND ANAEROBIC 10CC  Final   Culture PENDING  Incomplete   Report Status PENDING  Incomplete  MRSA PCR Screening     Status: None   Collection Time: 09/24/15  9:32 AM  Result Value Ref Range Status   MRSA by PCR NEGATIVE NEGATIVE Final    Comment:        The GeneXpert MRSA Assay (FDA approved for NASAL specimens only), is one component of a comprehensive MRSA colonization surveillance program. It is not intended to diagnose MRSA infection nor to guide or monitor treatment for MRSA infections.   Culture, sputum-assessment     Status: None   Collection Time: 09/24/15 10:19 AM  Result Value Ref Range Status   Specimen Description SPUTUM  Final   Special Requests Normal  Final   Sputum evaluation   Final    THIS SPECIMEN IS ACCEPTABLE. RESPIRATORY CULTURE REPORT TO FOLLOW.   Report Status 09/24/2015 FINAL  Final      Studies/Results: Dg Chest 2 View  09/23/2015  CLINICAL DATA:  Acute onset of shortness of breath and cough. Generalized fatigue. Initial encounter. EXAM: CHEST  2 VIEW COMPARISON:  None. FINDINGS: The lungs are well-aerated. Minimal bilateral atelectasis is noted. There is no evidence of pleural effusion or pneumothorax. The heart is normal in size; the mediastinal contour is within normal limits. No acute osseous abnormalities are seen. IMPRESSION: Minimal bilateral atelectasis noted.  Lungs otherwise clear. Electronically Signed   By: Garald Balding M.D.   On: 09/23/2015 20:35   Ct Head Wo Contrast  09/23/2015  CLINICAL DATA:  Extreme fatigue and weakness for several days. EXAM: CT HEAD WITHOUT CONTRAST TECHNIQUE: Contiguous axial images were obtained from the base of the skull through the vertex without intravenous contrast. COMPARISON:  None. FINDINGS: There is no evidence  of intracranial hemorrhage, brain edema, or other signs of acute infarction. There is no evidence of intracranial mass lesion or mass effect. No abnormal extraaxial fluid collections are identified. Moderate diffuse cerebral atrophy and chronic small vessel disease are seen. No evidence of obstructive hydrocephalus. No skull abnormality identified. Air-fluid levels are seen in both maxillary sinuses as well as mucosal thickening involving the ethmoid sinuses bilaterally. IMPRESSION: No acute intracranial abnormality. Moderate cerebral atrophy and chronic small vessel disease. Bilateral ethmoid and maxillary sinusitis, with bilateral maxillary air-fluid levels suggesting acute  sinusitis. Electronically Signed   By: Earle Gell M.D.   On: 09/23/2015 19:22   Ct Angio Chest Pe W/cm &/or Wo Cm  09/23/2015  CLINICAL DATA:  82 year old male with shortness of breath and weakness EXAM: CT ANGIOGRAPHY CHEST WITH CONTRAST TECHNIQUE: Multidetector CT imaging of the chest was performed using the standard protocol during bolus administration of intravenous contrast. Multiplanar CT image reconstructions and MIPs were obtained to evaluate the vascular anatomy. CONTRAST:  159mL OMNIPAQUE IOHEXOL 350 MG/ML SOLN COMPARISON:  Chest radiograph dated 07/23/2016 FINDINGS: Small bilateral pleural effusions. There is peribronchial thickening involving the right lower lobe with nodular and ground-glass densities at the right lung base concerning for bronchopneumonia. There is diffuse interstitial prominence which may represent a degree of congestion. No focal consolidation or pneumothorax. The central airways are patent. The thoracic aorta appears unremarkable. No CT evidence of pulmonary embolism. Top-normal subcarinal, right hilar, and paratracheal lymph nodes noted. There is no cardiomegaly. Small pericardial effusion measuring 5 mm in thickness. Correlation with echocardiogram recommended. There is coronary vascular calcification.  Small hiatal hernia. The esophagus appears unremarkable. No thyroid nodule identified. There is no axillary adenopathy. The chest wall soft tissues appear unremarkable. There is degenerative changes of the spine. No acute fracture. The visualized upper abdomen appears unremarkable. Review of the MIP images confirms the above findings. IMPRESSION: No CT evidence of pulmonary embolism. Findings likely represent bronchopneumonia predominantly involving the right lung base. Small bilateral pleural effusions. Electronically Signed   By: Anner Crete M.D.   On: 09/23/2015 23:12    Medications:  Scheduled: . azithromycin  500 mg Intravenous Q24H  . ceFEPime (MAXIPIME) IV  1 g Intravenous 3 times per day  . docusate sodium  100 mg Oral BID  . enoxaparin (LOVENOX) injection  40 mg Subcutaneous Q24H  . famotidine  10 mg Oral Daily  . guaiFENesin  600 mg Oral BID  . LORazepam  1-2 mg Oral BID  . polyethylene glycol  17 g Oral Daily  . senna  1 tablet Oral BID  . sodium chloride  3 mL Intravenous Q12H  . vancomycin  500 mg Intravenous Q12H   Continuous: . sodium chloride 75 mL/hr at 09/24/15 1231   [id number] **OR** acetaminophen, albuterol, HYDROcodone-acetaminophen, ondansetron **OR** ondansetron (ZOFRAN) IV, sodium chloride  Assessment/Plan:  Active Problems:   Constipation   Bronchopneumonia   Hyponatremia   Abnormal ECG   HCAP (healthcare-associated pneumonia)   Heart murmur    Pneumonia most likely community-acquired Patient lives in an independent living facility. This does not appear to be in healthcare associated pneumonia. However, continue broad-spectrum antibiotics till culture results are available. And then he can be transitioned to oral antibiotics.  Hyponatremia Probably secondary to hypovolemia. He also was consuming a lot of water, which could've also contributed. Sodium level has improved with IV fluids. Continue to monitor. Patient was on fluvoxamine means at  home, which apparently can cause hyponatremia. He has been taking this medication for 3 months. Hold for now.  History of anxiety disorder Continue anxiolytics  Constipation Continue home medications  Abnormal EKG and heart murmur EKG repeated this morning and is similar and does not show any new changes. Patient does not have any chest pain. Echocardiogram has been ordered, but can be pursued as an outpatient if not done during this weekend. BNP is noted to be normal.   DVT Prophylaxis: Lovenox    Code Status: Full code  Family Communication: Discussed with the patient  Disposition Plan: Continue  management as outlined above.    LOS: 0 days   [facility] Pager [phone] 09/24/2015, 1:04 PM  If 7PM-7AM, please contact night-coverage at [email or web address], password [facility]

## 2015-09-25 DIAGNOSIS — F4489 Other dissociative and conversion disorders: Secondary | ICD-10-CM

## 2015-09-25 DIAGNOSIS — R9431 Abnormal electrocardiogram [ECG] [EKG]: Secondary | ICD-10-CM

## 2015-09-25 DIAGNOSIS — E871 Hypo-osmolality and hyponatremia: Secondary | ICD-10-CM

## 2015-09-25 LAB — BASIC METABOLIC PANEL
Anion gap: 9 (ref 5–15)
BUN: 11 mg/dL (ref 6–20)
CO2: 25 mmol/L (ref 22–32)
Calcium: 8.6 mg/dL — ABNORMAL LOW (ref 8.9–10.3)
Chloride: 102 mmol/L (ref 101–111)
Creatinine, Ser: 0.84 mg/dL (ref 0.61–1.24)
GFR calc Af Amer: >60 mL/min (ref >60)
GFR calc non Af Amer: >60 mL/min (ref >60)
Glucose, Bld: 99 mg/dL (ref 65–99)
Potassium: 4.2 mmol/L (ref 3.5–5.1)
Sodium: 136 mmol/L (ref 135–145)

## 2015-09-25 LAB — CBC
HCT: 38.6 % — ABNORMAL LOW (ref 39.0–52.0)
Hemoglobin: 13.5 g/dL (ref 13.0–17.0)
MCH: 32.8 pg (ref 26.0–34.0)
MCHC: 35 g/dL (ref 30.0–36.0)
MCV: 93.7 fL (ref 78.0–100.0)
Platelets: 168 10*3/uL (ref 150–400)
RBC: 4.12 MIL/uL — ABNORMAL LOW (ref 4.22–5.81)
RDW: 12.8 % (ref 11.5–15.5)
WBC: 10.5 10*3/uL (ref 4.0–10.5)

## 2015-09-25 MED ORDER — HALOPERIDOL LACTATE 5 MG/ML IJ SOLN: 0.5000 mg | Freq: Four times a day (QID) | INTRAMUSCULAR | Status: DC | PRN

## 2015-09-25 MED ORDER — CEFPODOXIME PROXETIL 200 MG PO TABS
200.0000 mg | ORAL_TABLET | Freq: Two times a day (BID) | ORAL | Status: DC
  Administered 2015-09-25 – 2015-09-26 (×3): 200 mg via ORAL
  Filled 2015-09-25 (×4): qty 1

## 2015-09-25 NOTE — Evaluation (Addendum)
Physical Therapy Evaluation Patient Details Name: Adam Swanson. MRN: [id number] DOB: [birth year] Today's Date: 09/25/2015   History of Present Illness  82 yo male admitted with bronchopneumonia, hyponatremia. Hx of hiatal hernia, anxiety, OCD. Pt is from [facility]  Clinical Impression  On eval, pt was Min guard-Min assist for mobility-walked ~300 feet. Unsteady at times, requiring intermittent assist to stabilize/prevent LOB. Discussed d/c plan-recommend short stay at [place]/SNF at [facility] prior to returning to [facility] apt alone-pt is agreeable.     Follow Up Recommendations SNF ([facility] rehab prior to returning to [facility] apt)    Equipment Recommendations  None recommended by PT    Recommendations for Other Services       Precautions / Restrictions Precautions Precautions: Fall Restrictions Weight Bearing Restrictions: No      Mobility  Bed Mobility Overal bed mobility: Modified Independent                Transfers Overall transfer level: Needs assistance   Transfers: Sit to/from Stand Sit to Stand: Min guard         General transfer comment: close guard for safety. Unsteady with initial standing.  Ambulation/Gait Ambulation/Gait assistance: Min guard;Min assist Ambulation Distance (Feet): 300 Feet Assistive device: None Gait Pattern/deviations: Step-through pattern;Decreased stride length;Drifts right/left;Staggering right;Staggering left;Decreased step length - right;Decreased step length - left     General Gait Details: very close guard. Pt is unsteady. Intermittent assist to stabilize/prevent LOB   Stairs            Wheelchair Mobility    Modified Rankin (Stroke Patients Only)       Balance Overall balance assessment: Needs assistance;History of Falls         Standing balance support: During functional activity Standing balance-Leahy Scale: Fair                               Pertinent  Vitals/Pain Pain Assessment: No/denies pain    Home Living Family/patient expects to be discharged to:: Private residence Living Arrangements: Alone   Type of Home: Independent living facility-[facility] Home Access: Level entry     Home Layout: One level Home Equipment: None      Prior Function Level of Independence: Independent               Hand Dominance        Extremity/Trunk Assessment   Upper Extremity Assessment: Overall WFL for tasks assessed           Lower Extremity Assessment: Generalized weakness      Cervical / Trunk Assessment: Kyphotic  Communication   Communication: No difficulties  Cognition Arousal/Alertness: Awake/alert Behavior During Therapy: WFL for tasks assessed/performed Overall Cognitive Status: Within Functional Limits for tasks assessed                      General Comments      Exercises        Assessment/Plan    PT Assessment Patient needs continued PT services  PT Diagnosis Difficulty walking;Generalized weakness   PT Problem List Decreased strength;Decreased activity tolerance;Decreased balance;Decreased mobility  PT Treatment Interventions Gait training;Functional mobility training;Therapeutic activities;Patient/family education;Balance training;Therapeutic exercise   PT Goals (Current goals can be found in the Care Plan section) Acute Rehab PT Goals Patient Stated Goal[facility] PT Goal Formulation: With patient Time For Goal Achievement: 10/09/15 Potential to Achieve  Goals: Good    Frequency Min 3X/week   Barriers to discharge        Co-evaluation               End of Session Equipment Utilized During Treatment: Gait belt Activity Tolerance: Patient tolerated treatment well Patient left: in chair;with call bell/phone within reach;with chair alarm set           Time: [id number] PT Time Calculation (min) (ACUTE ONLY): 25 min   Charges:   PT  Evaluation $PT Eval Moderate Complexity: 1 Procedure PT Treatments $Gait Training: 8-22 mins   PT G Codes:        Weston Anna, MPT Pager: [phone]

## 2015-09-25 NOTE — Progress Notes (Signed)
[facility] PROGRESS NOTE  [name]. [id number] DOB: 06/09/1928 DOA: 09/23/2015  PCP: Gennette Pac, MD  Brief HPI: 82 year old Caucasian male with a past medical history of anxiety disorder who lives in an independent living facility presented with worsening fatigue, cough, ongoing for the past many days. He was recently seen by his primary care physician. He was diagnosed with acute bronchitis. He was asked to keep himself well-hydrated. Labs were checked subsequently, and he was noted to have low sodium. Patient was sent over to the emergency department. Evaluation revealed pneumonia as well as hyponatremia. He was subsequently hospitalized.  Past medical history:  Past Medical History  Diagnosis Date  . Hiatal hernia   . Diverticulosis of colon (without mention of hemorrhage)   . Anxiety   . OCD (obsessive compulsive disorder)   . Viral hepatitis 08/1983  . GERD (gastroesophageal reflux disease)   . Esophageal stricture   . Colon polyp     hyperplastic    Consultants: None  Procedures:  2-D echocardiogram Study Conclusions - Left ventricle: The cavity size was normal. Wall thickness wasnormal. Systolic function was normal. The estimated ejectionfraction was in the range of 60% to 65%. Wall motion was normal;there were no regional wall motion abnormalities. Dopplerparameters are consistent with abnormal left ventricularrelaxation (grade 1 diastolic dysfunction). The E/e&' ratio isbetween 8-15, suggesting indeterminate LV filling pressure. - Aortic valve: Sclerosis without stenosis. There was nosignificant regurgitation. - Mitral valve: Calcified annulus. There was trivial regurgitation. - Left atrium: The atrium was at the upper limits of normal insize. - Tricuspid valve: There was trivial regurgitation. - Inferior vena cava: The vessel was normal in size. Therespirophasic diameter changes were in the normal range (>= 50%),consistent with normal  central venous pressure. - Pericardium, extracardiac: A trivial pericardial effusion wasidentified. Features were not consistent with tamponadephysiology. Impressions: - LVEF 60-65%, normal wall thickness, normal wall motion, diastolicdysfunction, indeterminate LV filling pressure, aortic sclerosiswithout stenosis, upper normal LA size, trivial to smallpericardial effusion without tamponade.  Antibiotics: Vancomycin, cefepime and azithromycin 1/14 -- 1/15 Vantin and Azithromycin 1/15  Subjective: Patient somewhat agitated this morning. Had multiple questions which were all answered. He states that his breathing is about the same. Continues to have a cough with white expectoration. Denies any chest pains. No nausea or vomiting.  Objective: Vital Signs  Filed Vitals:   09/24/15 1300 09/24/15 2029 09/25/15 0244 09/25/15 0600  BP: 108/67 119/68 124/72 131/80  Pulse: 77 81 75 76  Temp: 97.9 F (36.6 C) 97.8 F (36.6 C) 97.9 F (36.6 C) 98.2 F (36.8 C)  TempSrc: Oral Oral Oral Oral  Resp: 18 18 18 18   Height:      Weight:      SpO2: 96% 97% 92% 92%    Intake/Output Summary (Last 24 hours) at 09/25/15 0959 Last data filed at 09/25/15 0849  Gross per 24 hour  Intake   1200 ml  Output   2650 ml  Net  -1450 ml   Filed Weights   09/24/15 0922  Weight: 66.6 kg (146 lb 13.2 oz)    General appearance: alert, somewhat agitated. Appears to be mildly confused. no distress Resp: Improved air entry bilaterally with a few crackles in the right side. No obvious wheezing or rhonchi. Cardio: regular rate and rhythm, S1, S2 normal, systolic murmur appreciated over the precordium GI: soft, non-tender; bowel sounds normal; no masses,  no organomegaly Extremities: extremities normal, atraumatic, no cyanosis or edema Neurologic: No facial asymmetry. Tongue is  midline. Moving all his extremities. No obvious deficits noted.  Lab Results:  Basic Metabolic Panel:  Recent Labs Lab  09/23/15 1932 09/23/15 1956 09/24/15 0705 09/25/15 0548  NA 127* 129* 131* 136  K 4.3 4.2 4.6 4.2  CL 93* 91* 99* 102  CO2 24  --  22 25  GLUCOSE 180* 177* 170* 99  BUN 10 10 13 11   CREATININE 0.88 0.80 0.91 0.84  CALCIUM 8.8*  --  8.8* 8.6*  MG  --   --  2.0  --   PHOS  --   --  3.6  --    Liver Function Tests:  Recent Labs Lab 09/23/15 1932 09/24/15 0705  AST 23 20  ALT 22 20  ALKPHOS 85 79  BILITOT 1.3* 1.8*  PROT 7.2 7.0  ALBUMIN 3.9 3.5   CBC:  Recent Labs Lab 09/23/15 1932 09/23/15 1956 09/24/15 0705 09/25/15 0548  WBC 9.2  --  10.3 10.5  NEUTROABS 6.9  --   --   --   HGB 14.9 16.0 14.1 13.5  HCT 43.3 47.0 40.9 38.6*  MCV 94.5  --  93.0 93.7  PLT 171  --  181 168   Cardiac Enzymes:  Recent Labs Lab 09/24/15 0705  TROPONINI <0.03   BNP (last 3 results)  Recent Labs  09/23/15 1932  BNP 22.1    CBG:  Recent Labs Lab 09/23/15 2011  GLUCAP 169*    Recent Results (from the past 240 hour(s))  Culture, blood (routine x 2) Call MD if unable to obtain prior to antibiotics being given     Status: None (Preliminary result)   Collection Time: 09/24/15  6:45 AM  Result Value Ref Range Status   Specimen Description   Final    BLOOD LEFT ANTECUBITAL Performed at [facility]  Final   Culture PENDING  Incomplete   Report Status PENDING  Incomplete  Culture, blood (routine x 2) Call MD if unable to obtain prior to antibiotics being given     Status: None (Preliminary result)   Collection Time: 09/24/15  7:05 AM  Result Value Ref Range Status   Specimen Description   Final    BLOOD RIGHT FOREARM Performed at [place]  Final   Culture PENDING  Incomplete   Report Status PENDING  Incomplete  MRSA PCR Screening     Status: None   Collection Time: 09/24/15  9:32 AM  Result Value Ref Range Status   MRSA by  PCR NEGATIVE NEGATIVE Final    Comment:        The GeneXpert MRSA Assay (FDA approved for NASAL specimens only), is one component of a comprehensive MRSA colonization surveillance program. It is not intended to diagnose MRSA infection nor to guide or monitor treatment for MRSA infections.   Culture, sputum-assessment     Status: None   Collection Time: 09/24/15 10:19 AM  Result Value Ref Range Status   Specimen Description SPUTUM  Final   Special Requests Normal  Final   Sputum evaluation   Final    THIS SPECIMEN IS ACCEPTABLE. RESPIRATORY CULTURE REPORT TO FOLLOW.   Report Status 09/24/2015 FINAL  Final      Studies/Results: Dg Chest 2 View  09/23/2015  CLINICAL DATA:  Acute onset of shortness of breath and cough. Generalized fatigue. Initial encounter. EXAM: CHEST  2 VIEW  COMPARISON:  None. FINDINGS: The lungs are well-aerated. Minimal bilateral atelectasis is noted. There is no evidence of pleural effusion or pneumothorax. The heart is normal in size; the mediastinal contour is within normal limits. No acute osseous abnormalities are seen. IMPRESSION: Minimal bilateral atelectasis noted.  Lungs otherwise clear. Electronically Signed   By: Garald Balding M.D.   On: 09/23/2015 20:35   Ct Head Wo Contrast  09/23/2015  CLINICAL DATA:  Extreme fatigue and weakness for several days. EXAM: CT HEAD WITHOUT CONTRAST TECHNIQUE: Contiguous axial images were obtained from the base of the skull through the vertex without intravenous contrast. COMPARISON:  None. FINDINGS: There is no evidence of intracranial hemorrhage, brain edema, or other signs of acute infarction. There is no evidence of intracranial mass lesion or mass effect. No abnormal extraaxial fluid collections are identified. Moderate diffuse cerebral atrophy and chronic small vessel disease are seen. No evidence of obstructive hydrocephalus. No skull abnormality identified. Air-fluid levels are seen in both maxillary sinuses as well as  mucosal thickening involving the ethmoid sinuses bilaterally. IMPRESSION: No acute intracranial abnormality. Moderate cerebral atrophy and chronic small vessel disease. Bilateral ethmoid and maxillary sinusitis, with bilateral maxillary air-fluid levels suggesting acute sinusitis. Electronically Signed   By: Earle Gell M.D.   On: 09/23/2015 19:22   Ct Angio Chest Pe W/cm &/or Wo Cm  09/23/2015  CLINICAL DATA:  82 year old male with shortness of breath and weakness EXAM: CT ANGIOGRAPHY CHEST WITH CONTRAST TECHNIQUE: Multidetector CT imaging of the chest was performed using the standard protocol during bolus administration of intravenous contrast. Multiplanar CT image reconstructions and MIPs were obtained to evaluate the vascular anatomy. CONTRAST:  130mL OMNIPAQUE IOHEXOL 350 MG/ML SOLN COMPARISON:  Chest radiograph dated 07/23/2016 FINDINGS: Small bilateral pleural effusions. There is peribronchial thickening involving the right lower lobe with nodular and ground-glass densities at the right lung base concerning for bronchopneumonia. There is diffuse interstitial prominence which may represent a degree of congestion. No focal consolidation or pneumothorax. The central airways are patent. The thoracic aorta appears unremarkable. No CT evidence of pulmonary embolism. Top-normal subcarinal, right hilar, and paratracheal lymph nodes noted. There is no cardiomegaly. Small pericardial effusion measuring 5 mm in thickness. Correlation with echocardiogram recommended. There is coronary vascular calcification. Small hiatal hernia. The esophagus appears unremarkable. No thyroid nodule identified. There is no axillary adenopathy. The chest wall soft tissues appear unremarkable. There is degenerative changes of the spine. No acute fracture. The visualized upper abdomen appears unremarkable. Review of the MIP images confirms the above findings. IMPRESSION: No CT evidence of pulmonary embolism. Findings likely represent  bronchopneumonia predominantly involving the right lung base. Small bilateral pleural effusions. Electronically Signed   By: Anner Crete M.D.   On: 09/23/2015 23:12    Medications:  Scheduled: . azithromycin  500 mg Intravenous Q24H  . ceFEPime (MAXIPIME) IV  1 g Intravenous 3 times per day  . docusate sodium  100 mg Oral BID  . enoxaparin (LOVENOX) injection  40 mg Subcutaneous Q24H  . famotidine  10 mg Oral Daily  . guaiFENesin  600 mg Oral BID  . LORazepam  1-2 mg Oral BID  . polyethylene glycol  17 g Oral Daily  . senna  1 tablet Oral BID  . sodium chloride  3 mL Intravenous Q12H  . vancomycin  500 mg Intravenous Q12H   Continuous:   [id number] **OR** acetaminophen, HYDROcodone-acetaminophen, ondansetron **OR** ondansetron (ZOFRAN) IV, sodium chloride  Assessment/Plan:  Active Problems:   Constipation  Bronchopneumonia   Hyponatremia   Abnormal ECG   HCAP (healthcare-associated pneumonia)   Heart murmur    Pneumonia most likely community-acquired Patient lives in an independent living facility. This does not appear to be in healthcare associated pneumonia. Chosen negative so far. Change vancomycin and cefepime to Vantin. Continue azithromycin.  Hyponatremia Probably secondary to hypovolemia. He also was consuming a lot of water, which could've also contributed. Sodium level has improved with IV fluids. Continue to monitor. Patient was on fluvoxamine means at home, which apparently can cause hyponatremia. He has been taking this medication for 3 months. Hold for now.  Mild confusion Patient noted to be agitated this morning. He does have an history of anxiety disorder. It is quite possible that acute illness and hospital stay is responsible for this. There is no focal neurological deficits. Continue to monitor for now. Reorient daily.  History of anxiety disorder Continue anxiolytics  Constipation Continue home medications  Abnormal EKG and heart  murmur Repeat EKG also did not show any new changes. Patient does not have any chest pain. Echocardiogram is as above. No wall motion abnormalities noted. Systolic function is normal. Grade 1 diastolic dysfunction is noted. He appears to be euvolemic currently. Don't anticipate further workup.    DVT Prophylaxis: Lovenox    Code Status: Full code  Family Communication: Discussed with the patient  Disposition Plan: Continue management as outlined above.    LOS: 1 day   [facility] Pager [phone] 09/25/2015, 9:59 AM  If 7PM-7AM, please contact night-coverage at [email or web address], password [email or web address]

## 2015-09-25 NOTE — Progress Notes (Signed)
Utilization review completed.  

## 2015-09-26 LAB — LEGIONELLA ANTIGEN, URINE

## 2015-09-26 MED ORDER — AZITHROMYCIN 500 MG PO TABS: 500.0000 mg | ORAL_TABLET | Freq: Every day | ORAL | Status: DC

## 2015-09-26 MED ORDER — ACETAMINOPHEN 325 MG PO TABS: 650.0000 mg | ORAL_TABLET | Freq: Four times a day (QID) | ORAL | Status: DC | PRN

## 2015-09-26 MED ORDER — GUAIFENESIN ER 600 MG PO TB12: 600.0000 mg | ORAL_TABLET | Freq: Two times a day (BID) | ORAL | Status: DC

## 2015-09-26 MED ORDER — CEFPODOXIME PROXETIL 200 MG PO TABS: 200.0000 mg | ORAL_TABLET | Freq: Two times a day (BID) | ORAL | Status: DC

## 2015-09-26 MED ORDER — LORAZEPAM 1 MG PO TABS: 1.0000 mg | ORAL_TABLET | Freq: Two times a day (BID) | ORAL | Status: DC

## 2015-09-26 NOTE — Progress Notes (Signed)
Pt for discharge to [facility] and [facility].  CSW facilitated pt discharge needs including contacting facility, faxing pt discharge information via [place], discussing with pt at bedside and pt son, John via telephone, providing RN phone number to call report, and providing discharge packet to pt son to provide to [facility] and [facility] as pt son plans to transport pt via private vehicle.   No further social work needs identified at this time.  CSW signing off.   Alison Murray, MSW, [place] Work [phone]

## 2015-09-26 NOTE — Discharge Instructions (Signed)

## 2015-09-26 NOTE — Plan of Care (Signed)
Problem: Activity: Goal: Risk for activity intolerance will decrease Outcome: Progressing Patient has increased ambulation activity, e/b walking the halls

## 2015-09-26 NOTE — NC FL2 (Signed)
[place] LEVEL OF CARE SCREENING TOOL     IDENTIFICATION  Patient Name: Adam Swanson. Birthdate: 1928-05-26 Sex: male Admission Date (Current Location): 09/23/2015  [facility] and [place] Number:  Herbalist and Address:  [facility],  [place] [address] [place]      Provider Number: [id number]  Attending Physician Name and Address:  Bonnielee Haff, MD  Relative Name and Phone Number:       Current Level of Care: Hospital Recommended Level of Care: [place] Prior Approval Number:    Date Approved/Denied:   PASRR Number: [id number]  Discharge Plan: SNF    Current Diagnoses: Patient Active Problem List   Diagnosis Date Noted  . Bronchopneumonia 09/24/2015  . Hyponatremia 09/24/2015  . Abnormal ECG 09/24/2015  . HCAP (healthcare-associated pneumonia) 09/24/2015  . Heart murmur 09/24/2015  . Hymenoptera allergy 05/21/2015  . 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 Unspecified constipation 04/09/2011  . Constipation 01/18/2011  . Change in bowel function 01/18/2011  . 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 General symptom  01/18/2011  . Oropharyngeal dysphagia 01/18/2011  . DIVERTICULOSIS, COLON 05/14/2007  . HIATAL HERNIA 03/19/2003    Orientation RESPIRATION BLADDER Height & Weight    Self, Time, Situation, Place  Normal Continent 5\' 7"  (170.2 cm) 146 lbs.  BEHAVIORAL SYMPTOMS/MOOD NEUROLOGICAL BOWEL NUTRITION STATUS      Continent Diet (Heart Room service appropriate; Fluid consistency: Thin )  AMBULATORY STATUS COMMUNICATION OF NEEDS Skin   Extensive Assist Verbally Normal                       Personal Care Assistance Level of Assistance  Bathing, Feeding, Dressing Bathing Assistance: Limited assistance Feeding assistance: Independent Dressing Assistance: Limited assistance     Functional Limitations Info             SPECIAL CARE FACTORS  FREQUENCY  PT (By licensed PT)     PT Frequency: 5x week OT Frequency: 5x week             Contractures      Additional Factors Info  Code Status, Allergies Code Status Info: FULL Allergies Info: Bee Venom           Current Medications (09/26/2015):  This is the current hospital active medication list Current Facility-Administered Medications  Medication Dose Route Frequency Provider Last Rate Last Dose  . acetaminophen (TYLENOL) tablet 650 mg  650 mg Oral Q6H PRN Toy Baker, MD       Or  . acetaminophen (TYLENOL) suppository 650 mg  650 mg Rectal Q6H PRN Toy Baker, MD      . azithromycin (ZITHROMAX) 500 mg in dextrose 5 % 250 mL IVPB  500 mg Intravenous Q24H Toy Baker, MD 250 mL/hr at 09/26/15 0651 500 mg at 09/26/15 0651  . cefpodoxime (VANTIN) tablet 200 mg  200 mg Oral Q12H Bonnielee Haff, MD   200 mg at 09/25/15 2129  . docusate sodium (COLACE) capsule 100 mg  100 mg Oral BID Toy Baker, MD   Stopped at 09/25/15 2131  . enoxaparin (LOVENOX) injection 40 mg  40 mg Subcutaneous Q24H Toy Baker, MD   40 mg at 09/25/15 0948  . famotidine (PEPCID) tablet 10 mg  10 mg Oral Daily Toy Baker, MD   10 mg at 09/25/15 0948  . guaiFENesin (MUCINEX) 12 hr tablet 600 mg  600 mg Oral BID Anastassia Doutova,  MD   600 mg at 09/25/15 2129  . haloperidol lactate (HALDOL) injection 0.5 mg  0.5 mg Intravenous Q6H PRN Bonnielee Haff, MD      . HYDROcodone-acetaminophen (NORCO/VICODIN) 5-325 MG per tablet 1-2 tablet  1-2 tablet Oral Q4H PRN Toy Baker, MD      . LORazepam (ATIVAN) tablet 1-2 mg  1-2 mg Oral BID Toy Baker, MD   2 mg at 09/25/15 2128  . ondansetron (ZOFRAN) tablet 4 mg  4 mg Oral Q6H PRN Toy Baker, MD       Or  . ondansetron (ZOFRAN) injection 4 mg  4 mg Intravenous Q6H PRN Toy Baker, MD      . polyethylene glycol (MIRALAX / GLYCOLAX) packet 17 g  17 g Oral Daily Toy Baker, MD   17 g at  09/24/15 0911  . senna (SENOKOT) tablet 8.6 mg  1 tablet Oral BID Toy Baker, MD   Stopped at 09/25/15 2131  . sodium chloride (OCEAN) 0.65 % nasal spray 1 spray  1 spray Nasal PRN Toy Baker, MD      . sodium chloride 0.9 % injection 3 mL  3 mL Intravenous Q12H Toy Baker, MD   3 mL at 09/25/15 2130     Discharge Medications: Please see discharge summary for a list of discharge medications.  Relevant Imaging Results:  Relevant Lab Results:   Additional Information SSN: [id number]  Harlon Flor, Student-SW [phone]

## 2015-09-26 NOTE — Discharge Summary (Signed)
[facility]  Physician Discharge Summary   Patient ID: Adam Swanson. MRN: [id number] DOB/AGE: Dec 30, 1927 82 y.o.  Admit date: 09/23/2015 Discharge date: 09/26/2015  PCP: Gennette Pac, MD  DISCHARGE DIAGNOSES:  Active Problems:   Constipation   Bronchopneumonia   Hyponatremia   Abnormal ECG   HCAP (healthcare-associated pneumonia)   Heart murmur   RECOMMENDATIONS FOR OUTPATIENT FOLLOW UP: 1. Check a basic metabolic panel in 1 week for Hyponatremia 2. Follow up with PCP in 1 week.  DISCHARGE CONDITION: fair  Diet recommendation: Regular diet  Filed Weights   09/24/15 0922  Weight: 66.6 kg (146 lb 13.2 oz)    INITIAL HISTORY: 82 year old Caucasian male with a past medical history of anxiety disorder who lives in an independent living facility presented with worsening fatigue, cough, ongoing for the past many days. He was recently seen by his primary care physician. He was diagnosed with acute bronchitis. He was asked to keep himself well-hydrated. Labs were checked subsequently, and he was noted to have low sodium. Patient was sent over to the emergency department. Evaluation revealed pneumonia as well as hyponatremia. He was subsequently hospitalized.  Consultations:  None  Procedures: 2-D echocardiogram Study Conclusions - Left ventricle: The cavity size was normal. Wall thickness wasnormal. Systolic function was normal. The estimated ejectionfraction was in the range of 60% to 65%. Wall motion was normal;there were no regional wall motion abnormalities. Dopplerparameters are consistent with abnormal left ventricularrelaxation (grade 1 diastolic dysfunction). The E/e&' ratio isbetween 8-15, suggesting indeterminate LV filling pressure. - Aortic valve: Sclerosis without stenosis. There was nosignificant regurgitation. - Mitral valve: Calcified annulus. There was trivial regurgitation. - Left atrium: The atrium was at the upper limits of normal  insize. - Tricuspid valve: There was trivial regurgitation. - Inferior vena cava: The vessel was normal in size. Therespirophasic diameter changes were in the normal range (>= 50%),consistent with normal central venous pressure. - Pericardium, extracardiac: A trivial pericardial effusion wasidentified. Features were not consistent with tamponadephysiology. Impressions: - LVEF 60-65%, normal wall thickness, normal wall motion, diastolicdysfunction, indeterminate LV filling pressure, aortic sclerosiswithout stenosis, upper normal LA size, trivial to smallpericardial effusion without tamponade.   HOSPITAL COURSE:   Pneumonia most likely community-acquired Patient presented from an independent living facility. This does not appear to be healthcare associated pneumonia and is actually community-acquired. Patient was initially placed on broad-spectrum antibiotics. Cultures are negative so far. He was changed over to oral antibiotics. He will be continued on Vantin and azithromycin. Patient's symptoms have improved. Still has a cough. He has been ambulating without any difficulties. Saturations are normal on room air. Okay for discharge to SNF today.   Hyponatremia Probably secondary to hypovolemia. He also was consuming a lot of water, which could've also contributed. Sodium level has improved with IV fluids. Patient was on fluvoxamine at home, which apparently can cause hyponatremia. Patient mentioned that he had been taking this medicine for 3 months. However, his son tells me that he has been on it for longer than that. Unlikely to have caused low sodium levels, especially because we have other reasons for same. I believe it is okay to resume this medication with close monitoring of sodium levels.   Mild confusion Patient noted to be confused in the mornings as well as later in the day. This is due to new environment. As the day progresses patient's mental status improves. This morning also he  appears to be slightly agitated. He was easily reoriented. He does not have  any focal neurological deficits on examination. Since the acute illness is improving, patient will be best served by returning to his usual environment. Discussed with his son and he agrees.   History of anxiety disorder Continue anxiolytics. OK to resume fluvoxamine as discussed above. He will need close monitoring of sodium levels.  Constipation Continue home medications  Abnormal EKG and heart murmur Repeat EKG also did not show any new changes. Patient does not have any chest pain. Echocardiogram is as above. No wall motion abnormalities noted. Systolic function is normal. Grade 1 diastolic dysfunction is noted. He appears to be euvolemic currently. Don't anticipate further workup.   Overall, patient appears to be improved. Mild sundowning is noted. Stable for discharge to SNF as determined by physical therapy. He will benefit from short-term rehabilitation.    PERTINENT LABS:  The results of significant diagnostics from this hospitalization (including imaging, microbiology, ancillary and laboratory) are listed below for reference.    Microbiology: Recent Results (from the past 240 hour(s))  Culture, blood (routine x 2) Call MD if unable to obtain prior to antibiotics being given     Status: None (Preliminary result)   Collection Time: 09/24/15  6:45 AM  Result Value Ref Range Status   Specimen Description BLOOD LEFT ANTECUBITAL  Final   Special Requests BOTTLES DRAWN AEROBIC AND ANAEROBIC 10CC  Final   Culture   Final    NO GROWTH 2 DAYS Performed at [facility]    Report Status PENDING  Incomplete  Culture, blood (routine x 2) Call MD if unable to obtain prior to antibiotics being given     Status: None (Preliminary result)   Collection Time: 09/24/15  7:05 AM  Result Value Ref Range Status   Specimen Description BLOOD RIGHT FOREARM  Final   Special Requests BOTTLES DRAWN AEROBIC AND ANAEROBIC  10CC  Final   Culture   Final    NO GROWTH 2 DAYS Performed at [facility]    Report Status PENDING  Incomplete  MRSA PCR Screening     Status: None   Collection Time: 09/24/15  9:32 AM  Result Value Ref Range Status   MRSA by PCR NEGATIVE NEGATIVE Final    Comment:        The GeneXpert MRSA Assay (FDA approved for NASAL specimens only), is one component of a comprehensive MRSA colonization surveillance program. It is not intended to diagnose MRSA infection nor to guide or monitor treatment for MRSA infections.   Culture, sputum-assessment     Status: None   Collection Time: 09/24/15 10:19 AM  Result Value Ref Range Status   Specimen Description SPUTUM  Final   Special Requests Normal  Final   Sputum evaluation   Final    THIS SPECIMEN IS ACCEPTABLE. RESPIRATORY CULTURE REPORT TO FOLLOW.   Report Status 09/24/2015 FINAL  Final  Culture, respiratory (NON-Expectorated)     Status: None (Preliminary result)   Collection Time: 09/24/15 10:19 AM  Result Value Ref Range Status   Specimen Description SPUTUM  Final   Special Requests NONE  Final   Gram Stain   Final    ABUNDANT WBC PRESENT, PREDOMINANTLY PMN FEW SQUAMOUS EPITHELIAL CELLS PRESENT MODERATE GRAM NEGATIVE RODS RARE GRAM POSITIVE RODS FEW GRAM POSITIVE COCCI IN PAIRS    Culture   Final    Culture reincubated for better growth Performed at [facility]    Report Status PENDING  Incomplete     Labs: Basic Metabolic Panel:  Recent Labs Lab 09/23/15 1932 09/23/15 1956 09/24/15 0705 09/25/15 0548  NA 127* 129* 131* 136  K 4.3 4.2 4.6 4.2  CL 93* 91* 99* 102  CO2 24  --  22 25  GLUCOSE 180* 177* 170* 99  BUN 10 10 13 11   CREATININE 0.88 0.80 0.91 0.84  CALCIUM 8.8*  --  8.8* 8.6*  MG  --   --  2.0  --   PHOS  --   --  3.6  --    Liver Function Tests:  Recent Labs Lab 09/23/15 1932 09/24/15 0705  AST 23 20  ALT 22 20  ALKPHOS 85 79  BILITOT 1.3* 1.8*  PROT 7.2 7.0  ALBUMIN  3.9 3.5   CBC:  Recent Labs Lab 09/23/15 1932 09/23/15 1956 09/24/15 0705 09/25/15 0548  WBC 9.2  --  10.3 10.5  NEUTROABS 6.9  --   --   --   HGB 14.9 16.0 14.1 13.5  HCT 43.3 47.0 40.9 38.6*  MCV 94.5  --  93.0 93.7  PLT 171  --  181 168   Cardiac Enzymes:  Recent Labs Lab 09/24/15 0705  TROPONINI <0.03   BNP: BNP (last 3 results)  Recent Labs  09/23/15 1932  BNP 22.1   CBG:  Recent Labs Lab 09/23/15 2011  GLUCAP 169*     IMAGING STUDIES Dg Chest 2 View  09/23/2015  CLINICAL DATA:  Acute onset of shortness of breath and cough. Generalized fatigue. Initial encounter. EXAM: CHEST  2 VIEW COMPARISON:  None. FINDINGS: The lungs are well-aerated. Minimal bilateral atelectasis is noted. There is no evidence of pleural effusion or pneumothorax. The heart is normal in size; the mediastinal contour is within normal limits. No acute osseous abnormalities are seen. IMPRESSION: Minimal bilateral atelectasis noted.  Lungs otherwise clear. Electronically Signed   By: Garald Balding M.D.   On: 09/23/2015 20:35   Ct Head Wo Contrast  09/23/2015  CLINICAL DATA:  Extreme fatigue and weakness for several days. EXAM: CT HEAD WITHOUT CONTRAST TECHNIQUE: Contiguous axial images were obtained from the base of the skull through the vertex without intravenous contrast. COMPARISON:  None. FINDINGS: There is no evidence of intracranial hemorrhage, brain edema, or other signs of acute infarction. There is no evidence of intracranial mass lesion or mass effect. No abnormal extraaxial fluid collections are identified. Moderate diffuse cerebral atrophy and chronic small vessel disease are seen. No evidence of obstructive hydrocephalus. No skull abnormality identified. Air-fluid levels are seen in both maxillary sinuses as well as mucosal thickening involving the ethmoid sinuses bilaterally. IMPRESSION: No acute intracranial abnormality. Moderate cerebral atrophy and chronic small vessel disease.  Bilateral ethmoid and maxillary sinusitis, with bilateral maxillary air-fluid levels suggesting acute sinusitis. Electronically Signed   By: Earle Gell M.D.   On: 09/23/2015 19:22   Ct Angio Chest Pe W/cm &/or Wo Cm  09/23/2015  CLINICAL DATA:  82 year old male with shortness of breath and weakness EXAM: CT ANGIOGRAPHY CHEST WITH CONTRAST TECHNIQUE: Multidetector CT imaging of the chest was performed using the standard protocol during bolus administration of intravenous contrast. Multiplanar CT image reconstructions and MIPs were obtained to evaluate the vascular anatomy. CONTRAST:  18mL OMNIPAQUE IOHEXOL 350 MG/ML SOLN COMPARISON:  Chest radiograph dated 07/23/2016 FINDINGS: Small bilateral pleural effusions. There is peribronchial thickening involving the right lower lobe with nodular and ground-glass densities at the right lung base concerning for bronchopneumonia. There is diffuse interstitial prominence which may represent a degree of congestion. No  focal consolidation or pneumothorax. The central airways are patent. The thoracic aorta appears unremarkable. No CT evidence of pulmonary embolism. Top-normal subcarinal, right hilar, and paratracheal lymph nodes noted. There is no cardiomegaly. Small pericardial effusion measuring 5 mm in thickness. Correlation with echocardiogram recommended. There is coronary vascular calcification. Small hiatal hernia. The esophagus appears unremarkable. No thyroid nodule identified. There is no axillary adenopathy. The chest wall soft tissues appear unremarkable. There is degenerative changes of the spine. No acute fracture. The visualized upper abdomen appears unremarkable. Review of the MIP images confirms the above findings. IMPRESSION: No CT evidence of pulmonary embolism. Findings likely represent bronchopneumonia predominantly involving the right lung base. Small bilateral pleural effusions. Electronically Signed   By: Anner Crete M.D.   On: 09/23/2015 23:12     DISCHARGE EXAMINATION: Filed Vitals:   09/25/15 0600 09/25/15 1317 09/25/15 2040 09/26/15 0645  BP: 131/80 112/72 129/80 118/72  Pulse: 76 76 79 82  Temp: 98.2 F (36.8 C) 98.7 F (37.1 C) 98.1 F (36.7 C) 97.6 F (36.4 C)  TempSrc: Oral Oral Oral Oral  Resp: 18 18 18 18   Height:      Weight:      SpO2: 92% 94% 98% 91%   General appearance: alert, cooperative, distracted and no distress Resp: Improved air entry bilaterally. Few crackles at the bases, especially on the right. No wheezing. No rhonchi. Cardio: regular rate and rhythm, S1, S2 normal, no murmur, click, rub or gallop GI: soft, non-tender; bowel sounds normal; no masses,  no organomegaly Extremities: extremities normal, atraumatic, no cyanosis or edema Neurologic: Alert. Oriented to person, place, year, month. Had some difficulty with the date. No facial asymmetry. Tongue is midline. Rest of the cranial nerves normal limits. Motor strength equal bilateral upper and lower extremities. No pronator drift.  DISPOSITION: SNF or short-term rehabilitation. Should eventually be able to return to his independent living facility.  Discharge Instructions    Call MD for:  difficulty breathing, headache or visual disturbances    Complete by:  As directed      Call MD for:  extreme fatigue    Complete by:  As directed      Call MD for:  persistant dizziness or light-headedness    Complete by:  As directed      Call MD for:  persistant nausea and vomiting    Complete by:  As directed      Call MD for:  severe uncontrolled pain    Complete by:  As directed      Call MD for:  temperature >100.4    Complete by:  As directed      Diet general    Complete by:  As directed      Discharge instructions    Complete by:  As directed   Please have your Sodium Levels checked in 1 week (Blood Work: Art gallery manager).   You were cared for by a hospitalist during your hospital stay. If you have any questions about your discharge medications or the  care you received while you were in the hospital after you are discharged, you can call the unit and asked to speak with the hospitalist on call if the hospitalist that took care of you is not available. Once you are discharged, your primary care physician will handle any further medical issues. Please note that NO REFILLS for any discharge medications will be authorized once you are discharged, as it is imperative that you return to your primary care physician (  or establish a relationship with a primary care physician if you do not have one) for your aftercare needs so that they can reassess your need for medications and monitor your lab values. If you do not have a primary care physician, you can call [phone] for a physician referral.     Increase activity slowly    Complete by:  As directed            ALLERGIES:  Allergies  Allergen Reactions  . Bee Venom Anaphylaxis     Current Discharge Medication List    START taking these medications   Details  acetaminophen (TYLENOL) 325 MG tablet Take 2 tablets (650 mg total) by mouth every 6 (six) hours as needed for mild pain (or Fever >/= 101).    azithromycin (ZITHROMAX) 500 MG tablet Take 1 tablet (500 mg total) by mouth daily. For 5 more days Qty: 5 tablet, Refills: 0    cefpodoxime (VANTIN) 200 MG tablet Take 1 tablet (200 mg total) by mouth every 12 (twelve) hours. For 5 more days Qty: 10 tablet, Refills: 0    guaiFENesin (MUCINEX) 600 MG 12 hr tablet Take 1 tablet (600 mg total) by mouth 2 (two) times daily.      CONTINUE these medications which have CHANGED   Details  LORazepam (ATIVAN) 1 MG tablet Take 1-2 tablets (1-2 mg total) by mouth 2 (two) times daily. Take 1 tablet at noon and 2 tablets at bedtime Qty: 30 tablet, Refills: 0      CONTINUE these medications which have NOT CHANGED   Details  Cholecalciferol (VITAMIN D) 1000 UNITS capsule Take 1,000 Units by mouth daily.      fluvoxaMINE (LUVOX) 50 MG tablet Take 100 mg  by mouth at bedtime.     Multiple Vitamins-Minerals (PRESERVISION AREDS) CAPS Take 1 capsule by mouth 2 (two) times daily.    polyethylene glycol (MIRALAX / GLYCOLAX) packet Take 17 g by mouth daily.      ranitidine (ZANTAC) 150 MG tablet Take 150 mg by mouth daily.    sodium chloride (OCEAN) 0.65 % SOLN nasal spray Place 1 spray into both nostrils 3 (three) times daily as needed for congestion.    EPINEPHrine (EPI-PEN) 0.3 mg/0.3 mL DEVI Inject 0.3 mg into the muscle once.      STOP taking these medications     omeprazole (PRILOSEC) 20 MG capsule      ondansetron (ZOFRAN-ODT) 4 MG disintegrating tablet        Follow-up Information    Follow up with Gennette Pac, MD. Schedule an appointment as soon as possible for a visit in 1 week.   Specialty:  Family Medicine   Why:  post hospitalization follow up   Contact information:   [place] [address] [phone]       TOTAL DISCHARGE TIME: 35 mins  [facility] Pager [phone]  09/26/2015, 12:39 PM

## 2015-09-26 NOTE — Clinical Social Work Placement (Signed)
   CLINICAL SOCIAL WORK PLACEMENT  NOTE  Date:  09/26/2015  Patient Details  Name: Adam Swanson. MRN: [id number] Date of Birth: [birth year]  Clinical Social Work is seeking post-discharge placement for this patient at the [place] level of care (*CSW will initial, date and re-position this form in  chart as items are completed):  Yes   Patient/family provided with [place] Work Department's list of facilities offering this level of care within the geographic area requested by the patient (or if unable, by the patient's family).  Yes   Patient/family informed of their freedom to choose among providers that offer the needed level of care, that participate in Medicare, Medicaid or managed care program needed by the patient, have an available bed and are willing to accept the patient.  Yes   Patient/family informed of [facility]'s ownership interest in [facility] and [facility], as well as of the fact that they are under no obligation to receive care at these facilities.  PASRR submitted to EDS on 09/26/15     PASRR number received on 09/26/15     Existing PASRR number confirmed on       FL2 transmitted to all facilities in geographic area requested by pt/family on 09/26/15     FL2 transmitted to all facilities within larger geographic area on       Patient informed that his/her managed care company has contracts with or will negotiate with certain facilities, including the following:        Yes   Patient/family informed of bed offers received.  Patient chooses bed at [facility]     Physician recommends and patient chooses bed at      Patient to be [facility] on 09/26/15.  Patient to be transferred to facility by pt son, [name] via private vehicle     Patient family notified on 09/26/15 of transfer.  Name of family member notified:  pt and pt son, [name] notified     PHYSICIAN Please sign FL2     Additional  Comment:    _______________________________________________ Ladell Pier, LCSW 09/26/2015, 2:16 PM  [phone]

## 2015-09-26 NOTE — Progress Notes (Signed)
Report called to [facility], [place] at 1450. Awaiting son to come transport pt.  Rosine Beat, RN

## 2015-09-26 NOTE — Clinical Social Work Note (Signed)
Clinical Social Work Assessment  Patient Details  Name: Adam Swanson. MRN: [id number] Date of Birth: 22-Jan-1928  Date of referral:  09/26/15               Reason for consult:  Facility Placement                Permission sought to share information with:  Family Supports Permission granted to share information::  Yes, Verbal Permission Granted  Name::     Aida Puffer  Agency::     Relationship::  Son  Sport and exercise psychologist Information:     Housing/Transportation Living arrangements for the past 2 months:  [place] of Information:  Patient Patient Interpreter Needed:  None Criminal Activity/Legal Involvement Pertinent to Current Situation/Hospitalization:  No - Comment as needed Significant Relationships:  Adult Children Lives with:  Facility Resident Do you feel safe going back to the place where you live?  Yes (Pt from [place], agreeable to [facility] SNF) Need for family participation in patient care:  No ([place])  Care giving concerns:  Pt is from [place]. PT recommends pt for short-term rehab at [facility].    Social Worker assessment / plan:  CSW contacted [facility] who stated they have a bed available for pt at the SNF level. BSW Intern me with at bedside and introduced self and role. Pt engaged in conversation. Pt stated living at [facility] for the past three years. Pt enjoys facility and staff. Pt agreeable to dc to [facility] for short-term rehab.   Pt requested to contact his son and POA, Cayle Bailey in room. Pt stated his son makes his decisions. BSW Intern left vm with pt son regarding bed available at [facility].   Pt inquired about dc and receiving generic medication when dc to [facility]. BSW Intern instructed pt to inform MD of request.  CSW to continue to follow and provide pt and pt family with support.   Employment status:  Retired Forensic scientist:  Medicare PT  Recommendations:  [place] / Referral to community resources:  [place]  Patient/Family's Response to care:  Pt alert and oriented x4. Pt pleased with bed at [facility].   Patient/Family's Understanding of and Emotional Response to Diagnosis, Current Treatment, and Prognosis:  Pt understanding of diagnosis and treatment. Pt understanding of PT referral for short-term rehab to gain strength.   Emotional Assessment Appearance:  Appears stated age Attitude/Demeanor/Rapport:  Other (Engaged, Appropriate) Affect (typically observed):  Accepting, Appropriate, Pleasant Orientation:  Oriented to Self, Oriented to Place, Oriented to  Time, Oriented to Situation Alcohol / Substance use:  Not Applicable Psych involvement (Current and /or in the community):  No (Comment)  Discharge Needs  Concerns to be addressed:  No discharge needs identified Readmission within the last 30 days:  No Current discharge risk:  None Barriers to Discharge:  Continued Medical Work up   [employer], Student-SW 09/26/2015, 9:50 AM

## 2015-09-27 DIAGNOSIS — R710 Precipitous drop in hematocrit: Secondary | ICD-10-CM | POA: Diagnosis not present

## 2015-09-27 DIAGNOSIS — J180 Bronchopneumonia, unspecified organism: Secondary | ICD-10-CM | POA: Diagnosis not present

## 2015-09-27 DIAGNOSIS — R2689 Other abnormalities of gait and mobility: Secondary | ICD-10-CM | POA: Diagnosis not present

## 2015-09-27 DIAGNOSIS — M6281 Muscle weakness (generalized): Secondary | ICD-10-CM | POA: Diagnosis not present

## 2015-09-27 DIAGNOSIS — E871 Hypo-osmolality and hyponatremia: Secondary | ICD-10-CM | POA: Diagnosis not present

## 2015-09-27 DIAGNOSIS — J189 Pneumonia, unspecified organism: Secondary | ICD-10-CM | POA: Diagnosis not present

## 2015-09-27 DIAGNOSIS — R4182 Altered mental status, unspecified: Secondary | ICD-10-CM | POA: Diagnosis not present

## 2015-09-27 DIAGNOSIS — R41841 Cognitive communication deficit: Secondary | ICD-10-CM | POA: Diagnosis not present

## 2015-09-27 DIAGNOSIS — R5381 Other malaise: Secondary | ICD-10-CM | POA: Diagnosis not present

## 2015-09-27 LAB — CULTURE, RESPIRATORY W GRAM STAIN

## 2015-09-27 LAB — CULTURE, RESPIRATORY

## 2015-09-28 DIAGNOSIS — J189 Pneumonia, unspecified organism: Secondary | ICD-10-CM | POA: Diagnosis not present

## 2015-09-28 DIAGNOSIS — R41841 Cognitive communication deficit: Secondary | ICD-10-CM | POA: Diagnosis not present

## 2015-09-28 DIAGNOSIS — R4182 Altered mental status, unspecified: Secondary | ICD-10-CM | POA: Diagnosis not present

## 2015-09-28 DIAGNOSIS — Z79899 Other long term (current) drug therapy: Secondary | ICD-10-CM | POA: Diagnosis not present

## 2015-09-28 DIAGNOSIS — J180 Bronchopneumonia, unspecified organism: Secondary | ICD-10-CM | POA: Diagnosis not present

## 2015-09-28 DIAGNOSIS — R2689 Other abnormalities of gait and mobility: Secondary | ICD-10-CM | POA: Diagnosis not present

## 2015-09-28 DIAGNOSIS — M6281 Muscle weakness (generalized): Secondary | ICD-10-CM | POA: Diagnosis not present

## 2015-09-29 DIAGNOSIS — R2689 Other abnormalities of gait and mobility: Secondary | ICD-10-CM | POA: Diagnosis not present

## 2015-09-29 DIAGNOSIS — R4182 Altered mental status, unspecified: Secondary | ICD-10-CM | POA: Diagnosis not present

## 2015-09-29 DIAGNOSIS — R41841 Cognitive communication deficit: Secondary | ICD-10-CM | POA: Diagnosis not present

## 2015-09-29 DIAGNOSIS — J189 Pneumonia, unspecified organism: Secondary | ICD-10-CM | POA: Diagnosis not present

## 2015-09-29 DIAGNOSIS — M6281 Muscle weakness (generalized): Secondary | ICD-10-CM | POA: Diagnosis not present

## 2015-09-29 DIAGNOSIS — J180 Bronchopneumonia, unspecified organism: Secondary | ICD-10-CM | POA: Diagnosis not present

## 2015-09-29 LAB — CULTURE, BLOOD (ROUTINE X 2)
Culture: NO GROWTH
Culture: NO GROWTH

## 2015-09-30 DIAGNOSIS — R2689 Other abnormalities of gait and mobility: Secondary | ICD-10-CM | POA: Diagnosis not present

## 2015-09-30 DIAGNOSIS — R4182 Altered mental status, unspecified: Secondary | ICD-10-CM | POA: Diagnosis not present

## 2015-09-30 DIAGNOSIS — M6281 Muscle weakness (generalized): Secondary | ICD-10-CM | POA: Diagnosis not present

## 2015-09-30 DIAGNOSIS — R41841 Cognitive communication deficit: Secondary | ICD-10-CM | POA: Diagnosis not present

## 2015-09-30 DIAGNOSIS — J189 Pneumonia, unspecified organism: Secondary | ICD-10-CM | POA: Diagnosis not present

## 2015-09-30 DIAGNOSIS — J180 Bronchopneumonia, unspecified organism: Secondary | ICD-10-CM | POA: Diagnosis not present

## 2015-10-02 DIAGNOSIS — J189 Pneumonia, unspecified organism: Secondary | ICD-10-CM | POA: Diagnosis not present

## 2015-10-02 DIAGNOSIS — R4182 Altered mental status, unspecified: Secondary | ICD-10-CM | POA: Diagnosis not present

## 2015-10-02 DIAGNOSIS — J180 Bronchopneumonia, unspecified organism: Secondary | ICD-10-CM | POA: Diagnosis not present

## 2015-10-02 DIAGNOSIS — R2689 Other abnormalities of gait and mobility: Secondary | ICD-10-CM | POA: Diagnosis not present

## 2015-10-02 DIAGNOSIS — R41841 Cognitive communication deficit: Secondary | ICD-10-CM | POA: Diagnosis not present

## 2015-10-02 DIAGNOSIS — M6281 Muscle weakness (generalized): Secondary | ICD-10-CM | POA: Diagnosis not present

## 2015-10-03 DIAGNOSIS — R4182 Altered mental status, unspecified: Secondary | ICD-10-CM | POA: Diagnosis not present

## 2015-10-03 DIAGNOSIS — R2689 Other abnormalities of gait and mobility: Secondary | ICD-10-CM | POA: Diagnosis not present

## 2015-10-03 DIAGNOSIS — J189 Pneumonia, unspecified organism: Secondary | ICD-10-CM | POA: Diagnosis not present

## 2015-10-03 DIAGNOSIS — R41841 Cognitive communication deficit: Secondary | ICD-10-CM | POA: Diagnosis not present

## 2015-10-03 DIAGNOSIS — J180 Bronchopneumonia, unspecified organism: Secondary | ICD-10-CM | POA: Diagnosis not present

## 2015-10-03 DIAGNOSIS — M6281 Muscle weakness (generalized): Secondary | ICD-10-CM | POA: Diagnosis not present

## 2015-10-04 DIAGNOSIS — M6281 Muscle weakness (generalized): Secondary | ICD-10-CM | POA: Diagnosis not present

## 2015-10-04 DIAGNOSIS — R2689 Other abnormalities of gait and mobility: Secondary | ICD-10-CM | POA: Diagnosis not present

## 2015-10-04 DIAGNOSIS — J180 Bronchopneumonia, unspecified organism: Secondary | ICD-10-CM | POA: Diagnosis not present

## 2015-10-04 DIAGNOSIS — R41841 Cognitive communication deficit: Secondary | ICD-10-CM | POA: Diagnosis not present

## 2015-10-04 DIAGNOSIS — R4182 Altered mental status, unspecified: Secondary | ICD-10-CM | POA: Diagnosis not present

## 2015-10-04 DIAGNOSIS — J189 Pneumonia, unspecified organism: Secondary | ICD-10-CM | POA: Diagnosis not present

## 2015-10-05 DIAGNOSIS — J189 Pneumonia, unspecified organism: Secondary | ICD-10-CM | POA: Diagnosis not present

## 2015-10-05 DIAGNOSIS — R2689 Other abnormalities of gait and mobility: Secondary | ICD-10-CM | POA: Diagnosis not present

## 2015-10-05 DIAGNOSIS — M6281 Muscle weakness (generalized): Secondary | ICD-10-CM | POA: Diagnosis not present

## 2015-10-05 DIAGNOSIS — R4182 Altered mental status, unspecified: Secondary | ICD-10-CM | POA: Diagnosis not present

## 2015-10-05 DIAGNOSIS — J180 Bronchopneumonia, unspecified organism: Secondary | ICD-10-CM | POA: Diagnosis not present

## 2015-10-05 DIAGNOSIS — R41841 Cognitive communication deficit: Secondary | ICD-10-CM | POA: Diagnosis not present

## 2015-10-10 DIAGNOSIS — R2689 Other abnormalities of gait and mobility: Secondary | ICD-10-CM | POA: Diagnosis not present

## 2015-10-10 DIAGNOSIS — J159 Unspecified bacterial pneumonia: Secondary | ICD-10-CM | POA: Diagnosis not present

## 2015-10-12 DIAGNOSIS — R2689 Other abnormalities of gait and mobility: Secondary | ICD-10-CM | POA: Diagnosis not present

## 2015-10-12 DIAGNOSIS — J159 Unspecified bacterial pneumonia: Secondary | ICD-10-CM | POA: Diagnosis not present

## 2015-10-17 DIAGNOSIS — R2689 Other abnormalities of gait and mobility: Secondary | ICD-10-CM | POA: Diagnosis not present

## 2015-10-17 DIAGNOSIS — J159 Unspecified bacterial pneumonia: Secondary | ICD-10-CM | POA: Diagnosis not present

## 2015-10-19 ENCOUNTER — Ambulatory Visit (INDEPENDENT_AMBULATORY_CARE_PROVIDER_SITE_OTHER): Payer: Medicare Other

## 2015-10-19 DIAGNOSIS — J159 Unspecified bacterial pneumonia: Secondary | ICD-10-CM | POA: Diagnosis not present

## 2015-10-19 DIAGNOSIS — R2689 Other abnormalities of gait and mobility: Secondary | ICD-10-CM | POA: Diagnosis not present

## 2015-10-19 DIAGNOSIS — T63441D Toxic effect of venom of bees, accidental (unintentional), subsequent encounter: Secondary | ICD-10-CM

## 2015-10-28 DIAGNOSIS — E871 Hypo-osmolality and hyponatremia: Secondary | ICD-10-CM | POA: Diagnosis not present

## 2015-10-28 DIAGNOSIS — J180 Bronchopneumonia, unspecified organism: Secondary | ICD-10-CM | POA: Diagnosis not present

## 2015-11-06 ENCOUNTER — Emergency Department (HOSPITAL_COMMUNITY): Payer: Medicare Other

## 2015-11-06 ENCOUNTER — Encounter (HOSPITAL_COMMUNITY): Payer: Self-pay

## 2015-11-06 ENCOUNTER — Emergency Department (HOSPITAL_COMMUNITY)
Admission: EM | Admit: 2015-11-06 | Discharge: 2015-11-06 | Disposition: A | Discharge status: Home / Self Care | Payer: Medicare Other | Attending: Emergency Medicine | Admitting: Emergency Medicine

## 2015-11-06 DIAGNOSIS — K219 Gastro-esophageal reflux disease without esophagitis: Secondary | ICD-10-CM | POA: Insufficient documentation

## 2015-11-06 DIAGNOSIS — M545 Low back pain: Secondary | ICD-10-CM | POA: Diagnosis not present

## 2015-11-06 DIAGNOSIS — R350 Frequency of micturition: Secondary | ICD-10-CM | POA: Diagnosis present

## 2015-11-06 DIAGNOSIS — Z8619 Personal history of other infectious and parasitic diseases: Secondary | ICD-10-CM | POA: Diagnosis not present

## 2015-11-06 DIAGNOSIS — F429 Obsessive-compulsive disorder, unspecified: Secondary | ICD-10-CM | POA: Insufficient documentation

## 2015-11-06 DIAGNOSIS — Z86010 Personal history of colonic polyps: Secondary | ICD-10-CM | POA: Insufficient documentation

## 2015-11-06 DIAGNOSIS — F419 Anxiety disorder, unspecified: Secondary | ICD-10-CM | POA: Insufficient documentation

## 2015-11-06 DIAGNOSIS — Z79899 Other long term (current) drug therapy: Secondary | ICD-10-CM | POA: Diagnosis not present

## 2015-11-06 DIAGNOSIS — K5730 Diverticulosis of large intestine without perforation or abscess without bleeding: Secondary | ICD-10-CM | POA: Diagnosis not present

## 2015-11-06 DIAGNOSIS — N400 Enlarged prostate without lower urinary tract symptoms: Secondary | ICD-10-CM | POA: Diagnosis not present

## 2015-11-06 LAB — URINALYSIS, ROUTINE W REFLEX MICROSCOPIC
Bilirubin Urine: NEGATIVE
Glucose, UA: NEGATIVE mg/dL
Hgb urine dipstick: NEGATIVE
Ketones, ur: NEGATIVE mg/dL
Leukocytes, UA: NEGATIVE
Nitrite: NEGATIVE
Protein, ur: NEGATIVE mg/dL
Specific Gravity, Urine: 1.015 (ref 1.005–1.030)
pH: 6.5 (ref 5.0–8.0)

## 2015-11-06 LAB — BASIC METABOLIC PANEL
Anion gap: 8 (ref 5–15)
BUN: 17 mg/dL (ref 6–20)
CO2: 26 mmol/L (ref 22–32)
Calcium: 9.5 mg/dL (ref 8.9–10.3)
Chloride: 99 mmol/L — ABNORMAL LOW (ref 101–111)
Creatinine, Ser: 0.92 mg/dL (ref 0.61–1.24)
GFR calc Af Amer: >60 mL/min (ref >60)
GFR calc non Af Amer: >60 mL/min (ref >60)
Glucose, Bld: 96 mg/dL (ref 65–99)
Potassium: 4.2 mmol/L (ref 3.5–5.1)
Sodium: 133 mmol/L — ABNORMAL LOW (ref 135–145)

## 2015-11-06 LAB — CBC
HCT: 44.8 % (ref 39.0–52.0)
Hemoglobin: 14.9 g/dL (ref 13.0–17.0)
MCH: 32 pg (ref 26.0–34.0)
MCHC: 33.3 g/dL (ref 30.0–36.0)
MCV: 96.1 fL (ref 78.0–100.0)
Platelets: 202 10*3/uL (ref 150–400)
RBC: 4.66 MIL/uL (ref 4.22–5.81)
RDW: 13.5 % (ref 11.5–15.5)
WBC: 4.9 10*3/uL (ref 4.0–10.5)

## 2015-11-06 MED ORDER — IOHEXOL 300 MG/ML  SOLN
25.0000 mL | Freq: Once | INTRAMUSCULAR | Status: AC | PRN
  Administered 2015-11-06: 25 mL via ORAL

## 2015-11-06 MED ORDER — OXYCODONE-ACETAMINOPHEN 5-325 MG PO TABS: 1.0000 | ORAL_TABLET | Freq: Three times a day (TID) | ORAL | Status: DC | PRN

## 2015-11-06 MED ORDER — IBUPROFEN 200 MG PO TABS
600.0000 mg | ORAL_TABLET | Freq: Once | ORAL | Status: AC
  Administered 2015-11-06: 600 mg via ORAL
  Filled 2015-11-06: qty 3

## 2015-11-06 MED ORDER — KETOROLAC TROMETHAMINE 30 MG/ML IJ SOLN: 30.0000 mg | Freq: Once | INTRAMUSCULAR | Status: DC

## 2015-11-06 MED ORDER — METHOCARBAMOL 500 MG PO TABS: 500.0000 mg | ORAL_TABLET | Freq: Four times a day (QID) | ORAL | Status: DC | PRN

## 2015-11-06 MED ORDER — IOHEXOL 300 MG/ML  SOLN
100.0000 mL | Freq: Once | INTRAMUSCULAR | Status: AC | PRN
  Administered 2015-11-06: 100 mL via INTRAVENOUS

## 2015-11-06 MED ORDER — MORPHINE SULFATE (PF) 4 MG/ML IV SOLN
4.0000 mg | Freq: Once | INTRAVENOUS | Status: DC
  Filled 2015-11-06: qty 1

## 2015-11-06 MED ORDER — TAMSULOSIN HCL 0.4 MG PO CAPS: 0.4000 mg | ORAL_CAPSULE | Freq: Every day | ORAL | Status: DC

## 2015-11-06 NOTE — ED Provider Notes (Signed)
CSN: [id number]     Arrival date & time 11/06/15  0901 History   First MD Initiated Contact with Patient 11/06/15 [phone]     Chief Complaint  Patient presents with  . Back Pain  . Urinary Frequency   (Consider location/radiation/quality/duration/timing/severity/associated sxs/prior Treatment) HPI 82 y.o. male presents to the Emergency Department today complaining of lower back pain x 2 weeks. Notes that the pain is 4/10 normally and feels like a dull ache. When moving fast or pending at the torso, the patient endorses 9/10 pain. No recent trauma. No Dysuria. No hematuria. No numbness/tingling. No loss of bowel or bladder function. Pt does endorse and increase in urinary frequency. No difficulty urinating. No feeling of fullness in bladder. No hx prostate cancer or issues. No CP/SOB/ABD pain. Patient recently [facility] from hospital on 09-26-15 for CAP. Pt finished ABX. Pt is a resident of independent living facility. No other symptoms noted.     Past Medical History  Diagnosis Date  . Hiatal hernia   . Diverticulosis of colon (without mention of hemorrhage)   . Anxiety   . OCD (obsessive compulsive disorder)   . Viral hepatitis 08/1983  . GERD (gastroesophageal reflux disease)   . Esophageal stricture   . Colon polyp     hyperplastic   Past Surgical History  Procedure Laterality Date  . Colonoscopy    . Hemorrhoid surgery      1965   Family History  Problem Relation Age of Onset  . Colon cancer Other     1st cousion.  . Diverticulosis Mother   . Melanoma Mother   . Lung cancer Father    Social History  Substance Use Topics  . Smoking status: Never Smoker   . Smokeless tobacco: Never Used  . Alcohol Use: 3.5 oz/week    7 Standard drinks or equivalent per week     Comment: 1 glass of wine a day    Review of Systems ROS reviewed and all are negative for acute change except as noted in the HPI.  Allergies  Bee venom  Home Medications   Prior to Admission medications    Medication Sig Start Date End Date Taking? Authorizing Provider  acetaminophen (TYLENOL) 325 MG tablet Take 2 tablets (650 mg total) by mouth every 6 (six) hours as needed for mild pain (or Fever >/= 101). 09/26/15   Bonnielee Haff, MD  azithromycin (ZITHROMAX) 500 MG tablet Take 1 tablet (500 mg total) by mouth daily. For 5 more days 09/26/15   Bonnielee Haff, MD  cefpodoxime (VANTIN) 200 MG tablet Take 1 tablet (200 mg total) by mouth every 12 (twelve) hours. For 5 more days 09/26/15   Bonnielee Haff, MD  Cholecalciferol (VITAMIN D) 1000 UNITS capsule Take 1,000 Units by mouth daily.      Historical Provider, MD  EPINEPHrine (EPI-PEN) 0.3 mg/0.3 mL DEVI Inject 0.3 mg into the muscle once.    Historical Provider, MD  fluvoxaMINE (LUVOX) 50 MG tablet Take 100 mg by mouth at bedtime.     Historical Provider, MD  guaiFENesin (MUCINEX) 600 MG 12 hr tablet Take 1 tablet (600 mg total) by mouth 2 (two) times daily. 09/26/15   Bonnielee Haff, MD  LORazepam (ATIVAN) 1 MG tablet Take 1-2 tablets (1-2 mg total) by mouth 2 (two) times daily. Take 1 tablet at noon and 2 tablets at bedtime 09/26/15   Bonnielee Haff, MD  Multiple Vitamins-Minerals (PRESERVISION AREDS) CAPS Take 1 capsule by mouth 2 (two) times daily.  Historical Provider, MD  polyethylene glycol (MIRALAX / GLYCOLAX) packet Take 17 g by mouth daily.      Historical Provider, MD  ranitidine (ZANTAC) 150 MG tablet Take 150 mg by mouth daily.    Historical Provider, MD  sodium chloride (OCEAN) 0.65 % SOLN nasal spray Place 1 spray into both nostrils 3 (three) times daily as needed for congestion.    Historical Provider, MD   BP 127/76 mmHg  Pulse 75  Temp(Src) 97.8 F (36.6 C) (Oral)  Resp 17  SpO2 99%   Physical Exam  Constitutional: He is oriented to person, place, and time. He appears well-developed and well-nourished.  HENT:  Head: Normocephalic and atraumatic.  Eyes: EOM are normal. Pupils are equal, round, and reactive to light.  Neck:  Normal range of motion. Neck supple.  Cardiovascular: Normal rate, regular rhythm and normal heart sounds.   Pulmonary/Chest: Effort normal and breath sounds normal. No respiratory distress. He has no wheezes.  Abdominal: Soft. Normal appearance and bowel sounds are normal. There is no tenderness. There is no rigidity, no rebound, no guarding, no CVA tenderness, no tenderness at McBurney's point and negative Murphy's sign.  Musculoskeletal: Normal range of motion.       Lumbar back: Normal. He exhibits no tenderness, no bony tenderness and no pain.  Neurological: He is alert and oriented to person, place, and time.  Skin: Skin is warm and dry.  Psychiatric: He has a normal mood and affect. His behavior is normal. Thought content normal.  Nursing note and vitals reviewed.  ED Course  Procedures (including critical care time) Labs Review Labs Reviewed  BASIC METABOLIC PANEL - Abnormal; Notable for the following:    Sodium 133 (*)    Chloride 99 (*)    All other components within normal limits  URINE CULTURE  CBC  URINALYSIS, ROUTINE W REFLEX MICROSCOPIC (NOT AT [facility])   Imaging Review Ct Abdomen Pelvis W Contrast  11/06/2015  CLINICAL DATA:  82 year old male with acute abdominal and back pain. EXAM: CT ABDOMEN AND PELVIS WITH CONTRAST TECHNIQUE: Multidetector CT imaging of the abdomen and pelvis was performed using the standard protocol following bolus administration of intravenous contrast. CONTRAST:  160mL OMNIPAQUE IOHEXOL 300 MG/ML  SOLN COMPARISON:  None. FINDINGS: Lower chest:  Unremarkable Hepatobiliary: The liver and gallbladder are unremarkable except for tiny right hepatic cyst. There is no evidence of biliary dilatation. Pancreas: Unremarkable Spleen: Unremarkable Adrenals/Urinary Tract: Mild bilateral renal cortical thinning and bilateral renal cysts are identified. There is no evidence of hydronephrosis or urinary calculi. The adrenal glands are unremarkable. Mild circumferential  bladder wall thickening is present. Stomach/Bowel: Colonic diverticulosis noted without evidence of diverticulitis. There is no evidence bowel obstruction a definite bowel wall thickening. The appendix is normal. Vascular/Lymphatic: Aortic atherosclerotic calcifications noted without aneurysm. No enlarged lymph nodes are identified. Reproductive: Prostate enlargement noted. Other: Small bilateral inguinal hernias containing fat are present. There is no evidence of free fluid, abscess or pneumoperitoneum. Musculoskeletal: 50% compression fractures of T12 and L1 appear chronic but correlate with pain. Lumbar spine degenerative changes and scoliosis again noted. IMPRESSION: No evidence of acute abnormality within the abdomen or pelvis. 50% compression fractures of T12 and L1-appears chronic but correlate with pain. Colonic diverticulosis without evidence of diverticulitis. Enlarged prostate, aortic atherosclerosis and small bilateral inguinal hernias containing fat. Electronically Signed   By: Margarette Canada M.D.   On: 11/06/2015 14:01   I have personally reviewed and evaluated these images and lab results as  part of my medical decision-making.   EKG Interpretation None      MDM  I have reviewed and evaluated the relevant laboratory values. I have reviewed the relevant previous healthcare records. I obtained HPI from historian. Patient discussed with supervising physician  ED Course:  Assessment: 60y M presents with lower back pain x 2 weeks. Notes pain is mostly associated with movement. On exam, Pt in NAD. VSS. Afebrile. Lungs CTa. Heart RRR. Abdomen nontender/soft. Full ROM lumbar spine. No spinous process tenderness. No TTP flanks. No CVA tenderness. Labs unremarkable. UA unremarkable. No sign of infection or UTI. Possible prostatitis vs. BPH. Due to continued pain, given analgesia for relief. Ordered CT abdomen/pelvis for further evaluation, which showed 50% compression fractures of T12 and L1 which  appear chronic. Also enlarged prostate. No other acute abnormalities. Will have patient follow up with PCP for further management of symptoms. Counseled on symptomatic treatment for back pain. Will DC with Flomax. At time of discharge, Patient is in no acute distress. Vital Signs are stable. Patient is able to ambulate. Patient able to tolerate PO.     Disposition/Plan:  DC Home Additional Verbal discharge instructions given and discussed with patient.  Pt Instructed to f/u with PCP in the next 48-72 hours for evaluation and treatment of symptoms. Return precautions given Pt acknowledges and agrees with plan  Supervising Physician Jola Schmidt, MD   Final diagnoses:  BPH (benign prostatic hyperplasia)  Low back pain, unspecified back pain laterality, with sciatica presence unspecified       Shary Decamp, PA-C 11/06/15 [place], MD 11/06/15 [phone]

## 2015-11-06 NOTE — ED Notes (Signed)
He remains in no distress and remains quite capably ambulatory with no evidence of ataxia, nor any other difficulty.

## 2015-11-06 NOTE — ED Notes (Signed)
He remains in no distress and is ambulatory.  He states he feels better.

## 2015-11-06 NOTE — ED Notes (Signed)
Pt states here 2 weeks ago for pneumonia.  Pt has had back pain x 2 weeks.  No cough/fever.  Last night pain was more on right and has had increase frequency in urination.  States pain is bad enough that he cannot get out of bed.

## 2015-11-06 NOTE — Discharge Instructions (Signed)
Please read and follow all provided instructions.  Your diagnoses today include:  1. BPH (benign prostatic hyperplasia)   2. Low back pain, unspecified back pain laterality, with sciatica presence unspecified    Tests performed today include:  Vital signs - see below for your results today  Medications prescribed:   Take any prescribed medications only as directed.  Home care instructions:   Follow any educational materials contained in this packet  Please rest, use ice or heat on your back for the next several days  Do not lift, push, pull anything more than 10 pounds for the next week  Follow-up instructions: Please follow-up with your primary care provider in the next 1 week for further evaluation of your symptoms.   Return instructions:  SEEK IMMEDIATE MEDICAL ATTENTION IF YOU HAVE:  New numbness, tingling, weakness, or problem with the use of your arms or legs  Severe back pain not relieved with medications  Loss control of your bowels or bladder  Increasing pain in any areas of the body (such as chest or abdominal pain)  Shortness of breath, dizziness, or fainting.   Worsening nausea (feeling sick to your stomach), vomiting, fever, or sweats  Any other emergent concerns regarding your health   Additional Information:  Your vital signs today were: BP 153/90 mmHg   Pulse 60   Temp(Src) 97.9 F (36.6 C) (Oral)   Resp 18   SpO2 97% If your blood pressure (BP) was elevated above 135/85 this visit, please have this repeated by your doctor within one month. --------------

## 2015-11-07 LAB — URINE CULTURE: Culture: NO GROWTH

## 2015-11-09 DIAGNOSIS — N402 Nodular prostate without lower urinary tract symptoms: Secondary | ICD-10-CM | POA: Diagnosis not present

## 2015-11-16 DIAGNOSIS — M4854XD Collapsed vertebra, not elsewhere classified, thoracic region, subsequent encounter for fracture with routine healing: Secondary | ICD-10-CM | POA: Diagnosis not present

## 2015-11-16 DIAGNOSIS — S32000D Wedge compression fracture of unspecified lumbar vertebra, subsequent encounter for fracture with routine healing: Secondary | ICD-10-CM | POA: Diagnosis not present

## 2015-11-16 DIAGNOSIS — E871 Hypo-osmolality and hyponatremia: Secondary | ICD-10-CM | POA: Diagnosis not present

## 2015-12-07 DIAGNOSIS — L821 Other seborrheic keratosis: Secondary | ICD-10-CM | POA: Diagnosis not present

## 2015-12-07 DIAGNOSIS — D1801 Hemangioma of skin and subcutaneous tissue: Secondary | ICD-10-CM | POA: Diagnosis not present

## 2015-12-07 DIAGNOSIS — S32000A Wedge compression fracture of unspecified lumbar vertebra, initial encounter for closed fracture: Secondary | ICD-10-CM | POA: Diagnosis not present

## 2015-12-07 DIAGNOSIS — L720 Epidermal cyst: Secondary | ICD-10-CM | POA: Diagnosis not present

## 2015-12-07 DIAGNOSIS — L570 Actinic keratosis: Secondary | ICD-10-CM | POA: Diagnosis not present

## 2015-12-07 DIAGNOSIS — E871 Hypo-osmolality and hyponatremia: Secondary | ICD-10-CM | POA: Diagnosis not present

## 2015-12-07 DIAGNOSIS — M4854XA Collapsed vertebra, not elsewhere classified, thoracic region, initial encounter for fracture: Secondary | ICD-10-CM | POA: Diagnosis not present

## 2015-12-08 ENCOUNTER — Encounter: Payer: Self-pay | Admitting: Gastroenterology

## 2015-12-13 DIAGNOSIS — H353120 Nonexudative age-related macular degeneration, left eye, stage unspecified: Secondary | ICD-10-CM | POA: Diagnosis not present

## 2015-12-13 DIAGNOSIS — H43813 Vitreous degeneration, bilateral: Secondary | ICD-10-CM | POA: Diagnosis not present

## 2015-12-13 DIAGNOSIS — Z961 Presence of intraocular lens: Secondary | ICD-10-CM | POA: Diagnosis not present

## 2015-12-13 DIAGNOSIS — H353110 Nonexudative age-related macular degeneration, right eye, stage unspecified: Secondary | ICD-10-CM | POA: Diagnosis not present

## 2015-12-14 ENCOUNTER — Ambulatory Visit (INDEPENDENT_AMBULATORY_CARE_PROVIDER_SITE_OTHER): Payer: Medicare Other

## 2015-12-14 DIAGNOSIS — T63441D Toxic effect of venom of bees, accidental (unintentional), subsequent encounter: Secondary | ICD-10-CM | POA: Diagnosis not present

## 2015-12-22 DIAGNOSIS — E871 Hypo-osmolality and hyponatremia: Secondary | ICD-10-CM | POA: Diagnosis not present

## 2015-12-22 DIAGNOSIS — H6120 Impacted cerumen, unspecified ear: Secondary | ICD-10-CM | POA: Diagnosis not present

## 2015-12-22 DIAGNOSIS — R197 Diarrhea, unspecified: Secondary | ICD-10-CM | POA: Diagnosis not present

## 2015-12-22 DIAGNOSIS — G4700 Insomnia, unspecified: Secondary | ICD-10-CM | POA: Diagnosis not present

## 2016-01-05 DIAGNOSIS — R197 Diarrhea, unspecified: Secondary | ICD-10-CM | POA: Diagnosis not present

## 2016-01-05 DIAGNOSIS — H6120 Impacted cerumen, unspecified ear: Secondary | ICD-10-CM | POA: Diagnosis not present

## 2016-01-05 DIAGNOSIS — G4700 Insomnia, unspecified: Secondary | ICD-10-CM | POA: Diagnosis not present

## 2016-01-05 DIAGNOSIS — E871 Hypo-osmolality and hyponatremia: Secondary | ICD-10-CM | POA: Diagnosis not present

## 2016-01-20 DIAGNOSIS — E871 Hypo-osmolality and hyponatremia: Secondary | ICD-10-CM | POA: Diagnosis not present

## 2016-02-07 ENCOUNTER — Ambulatory Visit (INDEPENDENT_AMBULATORY_CARE_PROVIDER_SITE_OTHER): Payer: Medicare Other | Admitting: *Deleted

## 2016-02-07 DIAGNOSIS — T63441D Toxic effect of venom of bees, accidental (unintentional), subsequent encounter: Secondary | ICD-10-CM | POA: Diagnosis not present

## 2016-02-24 DIAGNOSIS — E871 Hypo-osmolality and hyponatremia: Secondary | ICD-10-CM | POA: Diagnosis not present

## 2016-03-08 DIAGNOSIS — F339 Major depressive disorder, recurrent, unspecified: Secondary | ICD-10-CM | POA: Diagnosis not present

## 2016-03-21 ENCOUNTER — Ambulatory Visit: Payer: Medicare Other | Admitting: Allergy and Immunology

## 2016-04-02 DIAGNOSIS — T63441D Toxic effect of venom of bees, accidental (unintentional), subsequent encounter: Secondary | ICD-10-CM | POA: Diagnosis not present

## 2016-04-03 DIAGNOSIS — T63441D Toxic effect of venom of bees, accidental (unintentional), subsequent encounter: Secondary | ICD-10-CM | POA: Diagnosis not present

## 2016-04-04 ENCOUNTER — Ambulatory Visit (INDEPENDENT_AMBULATORY_CARE_PROVIDER_SITE_OTHER): Payer: Medicare Other | Admitting: Allergy and Immunology

## 2016-04-04 ENCOUNTER — Encounter: Payer: Self-pay | Admitting: Allergy and Immunology

## 2016-04-04 VITALS — BP 110/70 | HR 66 | Temp 97.2°F | Resp 16

## 2016-04-04 DIAGNOSIS — T63441D Toxic effect of venom of bees, accidental (unintentional), subsequent encounter: Secondary | ICD-10-CM | POA: Diagnosis not present

## 2016-04-04 NOTE — Progress Notes (Signed)
     FOLLOW UP NOTE  RE: Adam Swanson. MRN: [id number] DOB: 01-04-1928 [facility] [place] [address] [place] [id number] Date of Office Visit: 04/04/2016  Subjective:  Adam Swanson. is a 82 y.o. male who presents today for Allergies.  Assessment:   1. 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    Plan:  1.  Continue current regime---immunotherapy as discussed. 2.  EpiPen/Benadryl as needed. 3.  Follow-up in 9-12 months or sooner if needed. 4.  Communicate with the office with any questions or concerns.  HPI: Adam Swanson returns to the office in follow-up of bee venom allergy.  He continues on immunotherapy at every 8 weeks without difficulty.  Denies any recent stings and has otherwise been well.  He receives EpiPen from the [place] and reports it is up-to-date.  Marland Kitchen  He did have pneumonia in January with hospitalization, otherwise well and continues to follow with Dr. Rex Kras regularly.  Denies urgent care visits, prednisone or other antibiotic courses. Reports sleep and activity are normal.  Abdulwahab has a current medication list which includes the following prescription(s): bupropion, vitamin d, epinephrine, lorazepam, preservision areds, polyethylene glycol, ranitidine, and sodium chloride.   Drug Allergies: Allergies  Allergen Reactions  . Bee Venom Anaphylaxis   Objective:   Vitals:   04/04/16 1058  BP: 110/70  Pulse: 66  Resp: 16  Temp: 97.2 F (36.2 C)   SpO2 Readings from Last 1 Encounters:  04/04/16 98%   Physical Exam  Constitutional: He is well-developed, well-nourished, and in no distress.  HENT:  Head: Atraumatic.  Right Ear: Tympanic membrane and ear canal normal.  Left Ear: Tympanic membrane and ear canal normal.  Nose: Mucosal edema present. No rhinorrhea. No epistaxis.  Mouth/Throat: Oropharynx is clear and moist and mucous membranes are normal. No oropharyngeal exudate, posterior oropharyngeal  edema or posterior oropharyngeal erythema.  Eyes: Conjunctivae are normal.  Neck: Neck supple.  Cardiovascular: Normal rate, S1 normal and S2 normal.   No murmur heard. Pulmonary/Chest: Effort normal and breath sounds normal. He has no wheezes. He has no rhonchi. He has no rales.  Lymphadenopathy:    He has no cervical adenopathy.  Skin: Skin is warm and intact. No rash noted. No cyanosis. Nails show no clubbing.     Jayko Voorhees M. Ishmael Holter, MD  cc: Gennette Pac, MD

## 2016-04-09 NOTE — Patient Instructions (Signed)
   Continue current regime.  EpiPen/Benadryl as needed.

## 2016-04-13 DIAGNOSIS — E871 Hypo-osmolality and hyponatremia: Secondary | ICD-10-CM | POA: Diagnosis not present

## 2016-04-24 DIAGNOSIS — G453 Amaurosis fugax: Secondary | ICD-10-CM | POA: Diagnosis not present

## 2016-04-24 DIAGNOSIS — H5319 Other subjective visual disturbances: Secondary | ICD-10-CM | POA: Diagnosis not present

## 2016-04-24 DIAGNOSIS — H353110 Nonexudative age-related macular degeneration, right eye, stage unspecified: Secondary | ICD-10-CM | POA: Diagnosis not present

## 2016-04-24 DIAGNOSIS — H353120 Nonexudative age-related macular degeneration, left eye, stage unspecified: Secondary | ICD-10-CM | POA: Diagnosis not present

## 2016-04-27 DIAGNOSIS — Z79899 Other long term (current) drug therapy: Secondary | ICD-10-CM | POA: Diagnosis not present

## 2016-04-27 DIAGNOSIS — H540 Blindness, both eyes: Secondary | ICD-10-CM | POA: Diagnosis not present

## 2016-04-27 DIAGNOSIS — E871 Hypo-osmolality and hyponatremia: Secondary | ICD-10-CM | POA: Diagnosis not present

## 2016-05-28 DIAGNOSIS — F339 Major depressive disorder, recurrent, unspecified: Secondary | ICD-10-CM | POA: Diagnosis not present

## 2016-05-29 DIAGNOSIS — E782 Mixed hyperlipidemia: Secondary | ICD-10-CM | POA: Diagnosis not present

## 2016-05-29 DIAGNOSIS — F419 Anxiety disorder, unspecified: Secondary | ICD-10-CM | POA: Diagnosis not present

## 2016-05-29 DIAGNOSIS — Z79899 Other long term (current) drug therapy: Secondary | ICD-10-CM | POA: Diagnosis not present

## 2016-05-29 DIAGNOSIS — H540 Blindness, both eyes: Secondary | ICD-10-CM | POA: Diagnosis not present

## 2016-05-29 DIAGNOSIS — E871 Hypo-osmolality and hyponatremia: Secondary | ICD-10-CM | POA: Diagnosis not present

## 2016-05-30 ENCOUNTER — Ambulatory Visit (INDEPENDENT_AMBULATORY_CARE_PROVIDER_SITE_OTHER): Payer: Medicare Other

## 2016-05-30 DIAGNOSIS — T63441D Toxic effect of venom of bees, accidental (unintentional), subsequent encounter: Secondary | ICD-10-CM | POA: Diagnosis not present

## 2016-05-31 ENCOUNTER — Ambulatory Visit (INDEPENDENT_AMBULATORY_CARE_PROVIDER_SITE_OTHER): Payer: Medicare Other | Admitting: Endocrinology

## 2016-05-31 ENCOUNTER — Encounter: Payer: Self-pay | Admitting: Endocrinology

## 2016-05-31 VITALS — BP 112/71 | HR 68 | Ht 67.0 in | Wt 147.0 lb

## 2016-05-31 DIAGNOSIS — E871 Hypo-osmolality and hyponatremia: Secondary | ICD-10-CM | POA: Diagnosis not present

## 2016-05-31 LAB — BASIC METABOLIC PANEL
BUN: 17 mg/dL (ref 6–23)
CO2: 28 mEq/L (ref 19–32)
Calcium: 8.4 mg/dL (ref 8.4–10.5)
Chloride: 100 mEq/L (ref 96–112)
Creatinine, Ser: 1.1 mg/dL (ref 0.40–1.50)
GFR: 67.36 mL/min (ref >60.00)
Glucose, Bld: 98 mg/dL (ref 70–99)
Potassium: 4.1 mEq/L (ref 3.5–5.1)
Sodium: 132 mEq/L — ABNORMAL LOW (ref 135–145)

## 2016-05-31 LAB — CORTISOL
Cortisol, Plasma: 9.6 ug/dL
Cortisol, Plasma: 21 ug/dL

## 2016-05-31 NOTE — Progress Notes (Signed)
Subjective:    Patient ID: Adam Cota., male    DOB: 23-Apr-1928, 82 y.o.   MRN: [id number]  HPI Pt was first noted to have hyponatremia in 2012.  He has no h/o these: acei, duiretic, adrenal insuff, SIADH, cirrhosis, CHF, hypothyroid, nephrotic synd, chronic pain, brain injury, psychogenic polydipsia, pulm dz, chemotx, narcotics, SSRI, GBS.  He has mild hearing loss from both ears, and assoc anxiety Past Medical History:  Diagnosis Date  . Anxiety   . Colon polyp    hyperplastic  . Diverticulosis of colon (without mention of hemorrhage)   . Esophageal stricture   . GERD (gastroesophageal reflux disease)   . Hiatal hernia   . OCD (obsessive compulsive disorder)   . Viral hepatitis 08/1983    Past Surgical History:  Procedure Laterality Date  . COLONOSCOPY    . [place]    Social History   Social History  . Marital status: Married    Spouse name: N/A  . Number of children: 2  . Years of education: N/A   Occupational History  . retired    Social History Main Topics  . Smoking status: Never Smoker  . Smokeless tobacco: Never Used  . Alcohol use 3.5 oz/week    7 Standard drinks or equivalent per week     Comment: 1 glass of wine a day  . Drug use: No  . Sexual activity: Not on file   Other Topics Concern  . Not on file   Social History Narrative  . No narrative on file    Current Outpatient Prescriptions on File Prior to Visit  Medication Sig Dispense Refill  . buPROPion (WELLBUTRIN XL) 150 MG 24 hr tablet Take 150 mg by mouth 2 (two) times daily.     . Cholecalciferol (VITAMIN D) 1000 UNITS capsule Take 1,000 Units by mouth daily.      Marland Kitchen LORazepam (ATIVAN) 1 MG tablet Take 1-2 tablets (1-2 mg total) by mouth 2 (two) times daily. Take 1 tablet at noon and 2 tablets at bedtime 30 tablet 0  . Multiple Vitamins-Minerals (PRESERVISION AREDS) CAPS Take 1 capsule by mouth 2 (two) times daily.    . polyethylene glycol (MIRALAX / GLYCOLAX)  packet Take 17 g by mouth daily.      . ranitidine (ZANTAC) 150 MG tablet Take 150 mg by mouth daily.    . sodium chloride (OCEAN) 0.65 % SOLN nasal spray Place 1 spray into both nostrils 3 (three) times daily as needed for congestion.    Marland Kitchen EPINEPHrine (EPI-PEN) 0.3 mg/0.3 mL DEVI Inject 0.3 mg into the muscle once.     No current facility-administered medications on file prior to visit.     Allergies  Allergen Reactions  . Bee Venom Anaphylaxis    Family History  Problem Relation Age of Onset  . Diverticulosis Mother   . Melanoma Mother   . Lung cancer Father   . Colon cancer Other     1st cousion.  . Allergic rhinitis Neg Hx   . Angioedema Neg Hx   . Asthma Neg Hx   . Eczema Neg Hx   . Immunodeficiency Neg Hx   . Urticaria Neg Hx     BP 112/71   Pulse 68   Ht 5\' 7"  (1.702 m)   Wt 147 lb (66.7 kg)   BMI 23.02 kg/m    Review of Systems Denies weight change, numbness, depression, visual loss, sob, palpitations, n/v, polyuria,  open wound, back pain, rhinorrhea, cold intolerance, and headache.      Objective:   Physical Exam VS: see vs page GEN: no distress HEAD: head: no deformity eyes: no periorbital swelling, no proptosis external nose and ears are normal mouth: no lesion seen NECK: supple, thyroid is not enlarged CHEST WALL: no deformity LUNGS: clear to auscultation CV: reg rate and rhythm, no murmur ABD: abdomen is soft, nontender.  no hepatosplenomegaly.  not distended.  no hernia MUSCULOSKELETAL: muscle bulk and strength are grossly normal.  no obvious joint swelling.  gait is normal and steady EXTEMITIES: no deformity.  no edema PULSES: no carotid bruit NEURO:  cn 2-12 grossly intact.   readily moves all 4's.  sensation is intact to touch on all 4's.   SKIN:  Normal texture and temperature.  No rash or suspicious lesion is visible.   NODES:  None palpable at the neck.   PSYCH: alert, well-oriented.  Does not appear anxious nor depressed.    I have  reviewed outside records, and summarized: Pt was seen in ER in early 2017 for low-back pain.  However, he is not on chronic narcotics  CT chest: Findings likely represent bronchopneumonia predominantly involving the right lung base.  Small bilateral pleural effusions.  CT brain: No acute intracranial abnormality.  Lab Results  Component Value Date   CREATININE 1.10 05/31/2016   BUN 17 05/31/2016   NA 132 (L) 05/31/2016   K 4.1 05/31/2016   CL 100 05/31/2016   CO2 28 05/31/2016   ACTH stimulation test is done: baseline cortisol level=10 then Cosyntropin 250 mcg is given im 45 minutes later, cortisol level=21 (normal response).    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  he is ref to dietician. Medicare declines TG level.  Hearing loss: I told pt this is prob not related to hyponatremia.

## 2016-05-31 NOTE — Patient Instructions (Addendum)
blood tests are requested for you today.  We'll let you know about the results. Please try to limit how much fluid you drink (maximum 2 quarts per day), althou I realize this is difficult.      Fluid Restriction Some health conditions may require you to restrict your fluid intake. This means that you need to limit the amount of fluid you drink each day. When you have a fluid restriction, you must carefully measure and keep track of the amount of fluid you drink. Your health care provider will identify the specific amount of fluid you are allowed each day. This amount may depend on several things, such as:  The amount of urine you produce in a day.  How much fluid you are keeping (retaining) in your body.  Your blood pressure. WHAT IS MY PLAN? Your health care provider recommends that you limit your fluid intake to 2 quarts per day. WHAT COUNTS TOWARD MY FLUID INTAKE? Your fluid intake includes all liquids that you drink, as well as any foods that become liquid at room temperature.  The following are examples of some fluids that you will have to restrict:  Tea, coffee, soda, lemonade, milk, water, juice, sport drinks, and nutritional supplement beverages.  Alcoholic beverages.  Cream.  Gravy.  Ice cubes.  Soup and broth. The following are examples of foods that become liquid at room temperature. These foods will also count toward your fluid intake.  Ice cream and ice milk.  Frozen yogurt and sherbet.  Frozen ice pops.  Flavored gelatin. HOW DO I KEEP TRACK OF MY FLUID INTAKE? Each morning, fill a jug with the amount of water that equals the amount of fluid you are allowed for the day. You can use this water as a guideline for fluid allowance. Each time you take in any form of fluid, including ice cubes and foods that become liquid at room temperature, pour an equal amount of water out of the container. This helps you to see how much fluid you are taking in. It also helps you to  see how much of your fluid intake is left for the rest of the day. The following conversions may also be helpful in measuring your fluid intake:  1 cup equals 8 oz (240 mL).   cup equals 6 oz (180 mL).   cup equals 5 oz (160 mL).   cup equals 4 oz (120 mL).   cup equals 2 oz (80 mL).   cup equals 2 oz (60 mL).  2 Tbsp equals 1 oz (30 mL). WHAT HOME CARE INSTRUCTIONS SHOULD I FOLLOW WHILE RESTRICTING FLUIDS?  Make sure that you stay within the recommended limit each day. Always measure and keep track of your fluids, as well as any foods that turn liquid at room temperature.  Use small cups and glasses and learn to sip fluids slowly.  Add a slice of fresh lemon or lemon juice to water or ice. This helps to satisfy your thirst.  Freeze fruit juice or water in an ice cube tray. Use this as part of your fluid allowance. These cubes are useful for quenching your thirst. Measure the amount of liquid in each ice cube prior to freezing so you can subtract this amount from your day's allowance when you consume each frozen cube.  Try frozen fruits between meals, such as grapes or strawberries.  Swallow your pills along with meals or soft foods, such as applesauce or mashed potatoes. This helps you to save your fluid  allowance for something that you enjoy.  Weigh yourself every day. Keeping track of your daily weight can help you and your health care provider to notice as soon as possible if you are retaining too much fluid in your body.  Weigh yourself every morning after you urinate but before you eat breakfast.  Wear the same amount of clothing each time you weigh yourself.  Write down your daily weight. Give this weight record to your health care provider. If your weight is going up, you may be retaining too much fluid. Every 2 cups (480 mL) of fluid retained in the body becomes an extra 1 lb (0.45 kg) of body weight.  Avoid salty foods. These foods make you thirsty and make fluid  control more difficult.  Brush your teeth often or rinse your mouth with mouthwash to help your dry mouth. Lemon wedges, hard sour candies, chewing gum, or breath spray may also help to moisten your mouth.  Keep the temperature in your home at a cooler level. Dry air increases thirst, so keep the air in your home as humid as possible.  Avoid being out in the hot sun, which can cause you to sweat and become thirsty. WHAT ARE SOME SIGNS THAT I MAY BE TAKING IN TOO MUCH FLUID? You may be taking in too much fluid if:  Your weight increases. Contact your health care provider if your weight increases 3 lb or more in a day or if it increases 5 lb or more in a week.  Your face, hands, legs, feet, and belly (abdomen) start to swell.  You have trouble breathing.   This information is not intended to replace advice given to you by your health care provider. Make sure you discuss any questions you have with your health care provider.   Document Released: 06/24/2007 Document Revised: 09/17/2014 Document Reviewed: 01/26/2014 Elsevier Interactive Patient Education [employer].

## 2016-06-01 ENCOUNTER — Encounter: Payer: Medicare Other | Attending: Endocrinology | Admitting: Dietician

## 2016-06-01 ENCOUNTER — Encounter: Payer: Self-pay | Admitting: Endocrinology

## 2016-06-01 ENCOUNTER — Encounter: Payer: Self-pay | Admitting: Dietician

## 2016-06-01 DIAGNOSIS — Z713 Dietary counseling and surveillance: Secondary | ICD-10-CM | POA: Insufficient documentation

## 2016-06-01 DIAGNOSIS — E871 Hypo-osmolality and hyponatremia: Secondary | ICD-10-CM | POA: Insufficient documentation

## 2016-06-01 NOTE — Progress Notes (Signed)
Medical Nutrition Therapy:  Appt start time: [id number] end time:  1500.   Assessment:  Primary concerns today: Patient is here today alone to help with his hyponatremia.  He states that he is confused about the fluid restriction recommendations.  He states that his last Na level was 135 at [place] this week and noted to be 132 in this office 05/31/16.  Other hx includes GERD and diverticulosis.  He has consulted with Marya Landry, RD 6 weeks ago at his apartment complex and he keeps very specific notes about what he needs to do and brought them to this visit.  Wt Readings from Last 3 Encounters:  06/01/16 157 lb (71.2 kg)  05/31/16 147 lb (66.7 kg)  09/24/15 146 lb 13.2 oz (66.6 kg)   Patient lives alone at the [facility].  He eats dinner in the dining room and prepares his own breakfast and lunch.  His wife died 3 years ago and he move there at that time.  He is a retired Licensed conveyancer and traveled a lot in the past related to his job.  Preferred Learning Style:   No preference indicated   Learning Readiness:   Ready  Change in progress   MEDICATIONS: see list   DIETARY INTAKE:  Usual eating pattern includes 3 meals and 1 snacks per day.  Everyday foods include fiber cereal, soup.  Avoided foods include added salt.    24-hr recall:  B ( AM): Fiber one with 2% milk,  OJ and bacon 2 strips twice per week., 2 cups coffee, 8 ounces OJ, 1/2 cup milk Snk ( AM): none L ( PM): fruit, lunch meat or yogurt, cookie, 1 glass water Snk ( PM): 8 ounces Gatorade and pretzels D ( PM): Soup, fish or other meat, starch, vegetables, sometimes salad, occasional dessert or takes cookies and eats for lunch. Snk ( PM): none Beverages: 8 cups total (includes decaf coffee, water, lemonade, soup, OJ, milk and occasional ice cream)  Usual physical activity: He enjoys walking and time in the fitness center.  Estimated energy needs: 2000 calories 70 g protein  Progress Towards  Goal(s):  In progress.   Nutritional Diagnosis:  NB-1.1 Food and nutrition-related knowledge deficit As related to hyponatremia.  As evidenced by patient report.    Intervention:  Nutrition education related to limited fluid for hyponatremia.  Fluid limit of 2 L daily based on MD recommendations.  Discussed his diet hx and how he spreads fluid throughout the day and provided a sample plan based on his usual intake.  Discussed that there is no need for a sodium restriction and to include foods that he enjoys and added salt if he wishes.  He asked multiple times about alcohol.  He usually drinks a non-alcoholic beer but likes real beer at times.  Discussed that he should ask his MD and discussed limits on alcohol which patient was in agreement to and does not exceed.  Plan:  He will measure his classes to see how much fluid they contain. Decaf coffee It is fine if you have salty snacks (pretzels, peanuts, etc) A little added salt is fine.  Limit fluid to 8 cups daily (2 liters).  Each cup is 8 ounces.  This is an example of how you like liquids spread throughout the day.   Breakfast:   2 cups coffee with cream        1 cup orange juice    1/2 cup milk   Lunch:   1  cup water      Snack:  1 cup gatorade   Dinner:  1 cup water    1 cup lemonade or ice cream    1 cup soup (count as 1/2 cup fluid)  On nights you have the non-alcoholic beer then do not have the lemonade and soup.   If you have 2 non-alcoholic beer then no water, lemonade, or soup with dinner.   Teaching Method Utilized:  Visual Auditory Hands on  Handouts given during visit include:  Enlarged copy of directions above.  Barriers to learning/adherence to lifestyle change: anxiety, memory  Demonstrated degree of understanding via:  Teach Back   Monitoring/Evaluation:  Dietary intake, exercise, and body weight prn.

## 2016-06-01 NOTE — Patient Instructions (Addendum)
Decaf coffee It is fine if you have salty snacks (pretzels, peanuts, etc) A little added salt is fine.  Limit fluid to 8 cups daily (2 liters).  Each cup is 8 ounces.  This is an example of how you like liquids spread throughout the day.   Breakfast:   2 cups coffee with cream        1 cup orange juice    1/2 cup milk   Lunch:   1 cup water      Snack:  1 cup gatorade   Dinner:  1 cup water    1 cup lemonade or ice cream    1 cup soup (count as 1/2 cup fluid)  On nights you have the non-alcoholic beer then do not have the lemonade and soup.   If you have 2 non-alcoholic beer then no water, lemonade, or soup with dinner.

## 2016-06-05 DIAGNOSIS — G453 Amaurosis fugax: Secondary | ICD-10-CM | POA: Diagnosis not present

## 2016-06-05 DIAGNOSIS — E871 Hypo-osmolality and hyponatremia: Secondary | ICD-10-CM | POA: Diagnosis not present

## 2016-06-06 DIAGNOSIS — L570 Actinic keratosis: Secondary | ICD-10-CM | POA: Diagnosis not present

## 2016-06-06 DIAGNOSIS — L821 Other seborrheic keratosis: Secondary | ICD-10-CM | POA: Diagnosis not present

## 2016-06-07 ENCOUNTER — Telehealth: Payer: Self-pay | Admitting: Dietician

## 2016-06-07 NOTE — Telephone Encounter (Signed)
Patient ask you to give him a call °

## 2016-06-07 NOTE — Telephone Encounter (Signed)
Brief Nutrition Note  Patient called regarding questions from last weeks appointment.  He states that he has been following the fluid restriction closely.  Continued to ask about alcohol use.  He is limited to 64 ounces fluid daily.  Information needed to be restated several times but he was able to teach back.  His cognition was similar to last week.  Antonieta Iba, RD, LDN

## 2016-06-10 LAB — ARGININE VASOPRESSIN HORMONE: Arginine Vasopressin: <1 pg/mL — ABNORMAL LOW

## 2016-06-11 ENCOUNTER — Telehealth: Payer: Self-pay | Admitting: Endocrinology

## 2016-06-11 NOTE — Telephone Encounter (Signed)
Pt requests call back about his salt levels? He said he was just talking to you about this and needs to talk to you again.  He also wanted to make sure that his visit from Dr. Loanne Drilling be sent to Dr. Rex Kras.

## 2016-06-12 NOTE — Telephone Encounter (Signed)
Patient stated he wanted to be seen to discuss further. Patient scheduled for 05/26/2016.

## 2016-06-12 NOTE — Telephone Encounter (Signed)
You do not need to limit salt intake

## 2016-06-12 NOTE — Telephone Encounter (Signed)
Patient called back and stated he is very confused about his salt intake instructions. In the after visit summary from 05/31/2016 he was advised to avoid salty foods and then after his blood test's came back on 06/10/2016 he was advised to increase his salt intake. Patient stated he dose not know how to proceed. Patient stated he is following the the instructions from Antonieta Iba on his diet and   Please advise, Thanks!

## 2016-06-18 DIAGNOSIS — Z23 Encounter for immunization: Secondary | ICD-10-CM | POA: Diagnosis not present

## 2016-06-19 DIAGNOSIS — R208 Other disturbances of skin sensation: Secondary | ICD-10-CM | POA: Diagnosis not present

## 2016-06-19 DIAGNOSIS — G4700 Insomnia, unspecified: Secondary | ICD-10-CM | POA: Diagnosis not present

## 2016-06-20 DIAGNOSIS — G453 Amaurosis fugax: Secondary | ICD-10-CM | POA: Diagnosis not present

## 2016-06-22 ENCOUNTER — Encounter: Payer: BLUE CROSS/BLUE SHIELD | Admitting: Dietician

## 2016-06-25 ENCOUNTER — Encounter: Payer: Self-pay | Admitting: Endocrinology

## 2016-06-25 ENCOUNTER — Ambulatory Visit (INDEPENDENT_AMBULATORY_CARE_PROVIDER_SITE_OTHER): Payer: Medicare Other | Admitting: Endocrinology

## 2016-06-25 VITALS — BP 112/66 | HR 69 | Ht 67.0 in | Wt 145.0 lb

## 2016-06-25 DIAGNOSIS — E871 Hypo-osmolality and hyponatremia: Secondary | ICD-10-CM

## 2016-06-25 LAB — BASIC METABOLIC PANEL
BUN: 12 mg/dL (ref 6–23)
CO2: 31 mEq/L (ref 19–32)
Calcium: 9.3 mg/dL (ref 8.4–10.5)
Chloride: 97 mEq/L (ref 96–112)
Creatinine, Ser: 0.99 mg/dL (ref 0.40–1.50)
GFR: 76.06 mL/min (ref >60.00)
Glucose, Bld: 85 mg/dL (ref 70–99)
Potassium: 4.2 mEq/L (ref 3.5–5.1)
Sodium: 132 mEq/L — ABNORMAL LOW (ref 135–145)

## 2016-06-25 MED ORDER — DEMECLOCYCLINE HCL 150 MG PO TABS: 150.0000 mg | ORAL_TABLET | Freq: Two times a day (BID) | ORAL | 11 refills | Status: DC

## 2016-06-25 NOTE — Patient Instructions (Addendum)
I have sent a prescription to your pharmacy, to help your body eliminate water.  You do not need to limit salt intake, so you should take a "liberal salt" diet.   Please continue to limit how much fluid you drink (maximum 2 quarts per day).   Please come back for a follow-up appointment in 1 month.

## 2016-06-25 NOTE — Progress Notes (Signed)
Subjective:    Patient ID: Adam Cota., male    DOB: Jan 30, 1928, 82 y.o.   MRN: [id number]  HPI Pt return for f/u of hyponatremia (dx'ed 2012; no secondary cause was found; VP level was undetectable, and urine osm was 484, so nephrogenic cause is presumed; CT showed no evidence of hydronephrosis or urinary calculi). He reports slight weakness of the legs.  He is working on fluid restriction.   Past Medical History:  Diagnosis Date  . Anxiety   . Colon polyp    hyperplastic  . Diverticulosis of colon (without mention of hemorrhage)   . Esophageal stricture   . GERD (gastroesophageal reflux disease)   . Hiatal hernia   . OCD (obsessive compulsive disorder)   . Viral hepatitis 08/1983    Past Surgical History:  Procedure Laterality Date  . COLONOSCOPY    . [place]    Social History   Social History  . Marital status: Married    Spouse name: N/A  . Number of children: 2  . Years of education: N/A   Occupational History  . retired    Social History Main Topics  . Smoking status: Never Smoker  . Smokeless tobacco: Never Used  . Alcohol use 3.5 oz/week    7 Standard drinks or equivalent per week     Comment: 1 glass of wine a day  . Drug use: No  . Sexual activity: Not on file   Other Topics Concern  . Not on file   Social History Narrative  . No narrative on file    Current Outpatient Prescriptions on File Prior to Visit  Medication Sig Dispense Refill  . buPROPion (WELLBUTRIN XL) 150 MG 24 hr tablet Take 150 mg by mouth 2 (two) times daily.     . Cholecalciferol (VITAMIN D) 1000 UNITS capsule Take 1,000 Units by mouth daily.      Marland Kitchen EPINEPHrine (EPI-PEN) 0.3 mg/0.3 mL DEVI Inject 0.3 mg into the muscle once.    Marland Kitchen LORazepam (ATIVAN) 1 MG tablet Take 1-2 tablets (1-2 mg total) by mouth 2 (two) times daily. Take 1 tablet at noon and 2 tablets at bedtime 30 tablet 0  . Multiple Vitamins-Minerals (PRESERVISION AREDS) CAPS Take 1 capsule by  mouth 2 (two) times daily.    . polyethylene glycol (MIRALAX / GLYCOLAX) packet Take 17 g by mouth daily.      . sodium chloride (OCEAN) 0.65 % SOLN nasal spray Place 1 spray into both nostrils 3 (three) times daily as needed for congestion.    . ranitidine (ZANTAC) 150 MG tablet Take 150 mg by mouth daily.     No current facility-administered medications on file prior to visit.     Allergies  Allergen Reactions  . Bee Venom Anaphylaxis    Family History  Problem Relation Age of Onset  . Diverticulosis Mother   . Melanoma Mother   . Lung cancer Father   . Colon cancer Other     1st cousion.  . Allergic rhinitis Neg Hx   . Angioedema Neg Hx   . Asthma Neg Hx   . Eczema Neg Hx   . Immunodeficiency Neg Hx   . Urticaria Neg Hx    BP 112/66   Pulse 69   Ht 5\' 7"  (1.702 m)   Wt 145 lb (65.8 kg)   BMI 22.71 kg/m   Review of Systems He denies nausea.      Objective:  Physical Exam VITAL SIGNS:  See vs page GENERAL: no distress Ext: no edema.   outside test results are reviewed: Na+=133 K+=4.6 Creat=1.2  Lab Results  Component Value Date   CREATININE 0.99 06/25/2016   BUN 12 06/25/2016   NA 132 (L) 06/25/2016   K 4.2 06/25/2016   CL 97 06/25/2016   CO2 31 06/25/2016      Assessment & Plan:  Hyponatremia: persistent.  Patient is advised the following: Patient Instructions  I have sent a prescription to your pharmacy, to help your body eliminate water.  You do not need to limit salt intake, so you should take a "liberal salt" diet.   Please continue to limit how much fluid you drink (maximum 2 quarts per day).   Please come back for a follow-up appointment in 1 month.

## 2016-06-26 ENCOUNTER — Telehealth: Payer: Self-pay | Admitting: Family Medicine

## 2016-06-26 ENCOUNTER — Telehealth: Payer: Self-pay | Admitting: Dietician

## 2016-06-26 NOTE — Telephone Encounter (Signed)
Brief Nutrition Note  Returning patient call related to his fluid restriction. He is confused about the 2 quart fluid restriction. He saw Dr. Loanne Drilling yesterday for hyponatremia.  His note and labs were reviewed.    He is to limit his fluid intake to no more than 2 quarts per day.  This is 8 cups daily and is the same as we discussed in his RD appointment 06/01/16.    Patient was not available.  Left him a message that 2 quarts is the same limit he had before and is equal to 8 cups.  Antonieta Iba, RD, LDN

## 2016-06-26 NOTE — Telephone Encounter (Signed)
Patient would like the results of the labs he took yesterday.  If you get the answering machine please leave the results of the sodium reading on the machine.

## 2016-06-26 NOTE — Telephone Encounter (Signed)
I contacted the patient and advised of message below via voicemail.  please call patient:  Same  Please take the medication as we discussed.  I'll see you next time.   Requested a call back if the patient would like to discuss.

## 2016-06-27 ENCOUNTER — Telehealth: Payer: Self-pay | Admitting: Endocrinology

## 2016-06-27 DIAGNOSIS — R3915 Urgency of urination: Secondary | ICD-10-CM | POA: Diagnosis not present

## 2016-06-27 DIAGNOSIS — N401 Enlarged prostate with lower urinary tract symptoms: Secondary | ICD-10-CM | POA: Diagnosis not present

## 2016-06-27 NOTE — Telephone Encounter (Signed)
No medication alternative.  The other option is to liberally salt food, as we discussed

## 2016-06-27 NOTE — Telephone Encounter (Signed)
Pt called and said that when he went to pick up the Demeclocycline at the pharmacy, that the medication was over $200.  He would like to see if an alternative can be called into the pharmacy that is less expensive.  Please advise.

## 2016-06-27 NOTE — Telephone Encounter (Signed)
See message and please advise, Thanks!  

## 2016-06-28 ENCOUNTER — Telehealth: Payer: Self-pay

## 2016-06-28 NOTE — Telephone Encounter (Signed)
Called patient, he states he is taking a liberal amount of salt right now and that is what he will continue to do as he can not afford the $200 medication. Notified MD.

## 2016-06-29 NOTE — Telephone Encounter (Signed)
Brief Nutrition Note  Returned patient call.  He stated that he should follow a 2 quart per day fluid restriction.  I reiterated that 8 cups equals 2 quarts and to continue to follow what he had discussed.  He stated that he has increased his sodium intake.  Questions answered.

## 2016-07-03 DIAGNOSIS — H43811 Vitreous degeneration, right eye: Secondary | ICD-10-CM | POA: Diagnosis not present

## 2016-07-03 DIAGNOSIS — H353131 Nonexudative age-related macular degeneration, bilateral, early dry stage: Secondary | ICD-10-CM | POA: Diagnosis not present

## 2016-07-03 DIAGNOSIS — H35363 Drusen (degenerative) of macula, bilateral: Secondary | ICD-10-CM | POA: Diagnosis not present

## 2016-07-03 DIAGNOSIS — D3131 Benign neoplasm of right choroid: Secondary | ICD-10-CM | POA: Diagnosis not present

## 2016-07-04 DIAGNOSIS — R208 Other disturbances of skin sensation: Secondary | ICD-10-CM | POA: Diagnosis not present

## 2016-07-04 DIAGNOSIS — E871 Hypo-osmolality and hyponatremia: Secondary | ICD-10-CM | POA: Diagnosis not present

## 2016-07-04 DIAGNOSIS — S32010A Wedge compression fracture of first lumbar vertebra, initial encounter for closed fracture: Secondary | ICD-10-CM | POA: Diagnosis not present

## 2016-07-13 DIAGNOSIS — F339 Major depressive disorder, recurrent, unspecified: Secondary | ICD-10-CM | POA: Diagnosis not present

## 2016-07-17 DIAGNOSIS — R29898 Other symptoms and signs involving the musculoskeletal system: Secondary | ICD-10-CM | POA: Diagnosis not present

## 2016-07-19 DIAGNOSIS — R29898 Other symptoms and signs involving the musculoskeletal system: Secondary | ICD-10-CM | POA: Diagnosis not present

## 2016-07-19 DIAGNOSIS — M545 Low back pain: Secondary | ICD-10-CM | POA: Diagnosis not present

## 2016-07-22 NOTE — Progress Notes (Signed)
Subjective:    Patient ID: Adam Cota., male    DOB: 1927-10-15, 82 y.o.   MRN: [id number]  HPI Pt returns for f/u of hyponatremia (dx'ed 2012; no secondary cause was found; VP level was undetectable, and urine osm was 484, so nephrogenic cause is presumed; CT showed no evidence of hydronephrosis or urinary calculi; he [facility]).  He reports slight weakness of the legs.  He is working on fluid restriction.  Past Medical History:  Diagnosis Date  . Anxiety   . Colon polyp    hyperplastic  . Diverticulosis of colon (without mention of hemorrhage)   . Esophageal stricture   . GERD (gastroesophageal reflux disease)   . Hiatal hernia   . OCD (obsessive compulsive disorder)   . Viral hepatitis 08/1983    Past Surgical History:  Procedure Laterality Date  . COLONOSCOPY    . [place]    Social History   Social History  . Marital status: Married    Spouse name: N/A  . Number of children: 2  . Years of education: N/A   Occupational History  . retired    Social History Main Topics  . Smoking status: Never Smoker  . Smokeless tobacco: Never Used  . Alcohol use 3.5 oz/week    7 Standard drinks or equivalent per week     Comment: 1 glass of wine a day  . Drug use: No  . Sexual activity: Not on file   Other Topics Concern  . Not on file   Social History Narrative  . No narrative on file    Current Outpatient Prescriptions on File Prior to Visit  Medication Sig Dispense Refill  . Cholecalciferol (VITAMIN D) 1000 UNITS capsule Take 1,000 Units by mouth daily.      Marland Kitchen EPINEPHrine (EPI-PEN) 0.3 mg/0.3 mL DEVI Inject 0.3 mg into the muscle once.    Marland Kitchen LORazepam (ATIVAN) 1 MG tablet Take 1-2 tablets (1-2 mg total) by mouth 2 (two) times daily. Take 1 tablet at noon and 2 tablets at bedtime 30 tablet 0  . Multiple Vitamins-Minerals (PRESERVISION AREDS) CAPS Take 1 capsule by mouth 2 (two) times daily.    . polyethylene  glycol (MIRALAX / GLYCOLAX) packet Take 17 g by mouth daily.      . sodium chloride (OCEAN) 0.65 % SOLN nasal spray Place 1 spray into both nostrils 3 (three) times daily as needed for congestion.     No current facility-administered medications on file prior to visit.     Allergies  Allergen Reactions  . Bee Venom Anaphylaxis    Family History  Problem Relation Age of Onset  . Diverticulosis Mother   . Melanoma Mother   . Lung cancer Father   . Colon cancer Other     1st cousion.  . Allergic rhinitis Neg Hx   . Angioedema Neg Hx   . Asthma Neg Hx   . Eczema Neg Hx   . Immunodeficiency Neg Hx   . Urticaria Neg Hx     BP 122/80   Pulse 78   Ht 5\' 7"  (1.702 m)   Wt 147 lb (66.7 kg)   SpO2 93%   BMI 23.02 kg/m    Review of Systems Denies headache    Objective:   Physical Exam VITAL SIGNS:  See vs page.   GENERAL: no distress.  Ext: trace bilat leg edema.  outside test results are  reviewed: Na+=136    Assessment & Plan:  Hyponatremia: well-controlled Patient is advised the following: Patient Instructions  Please continue the same medication, including demeclocycline.   You do not need to limit salt intake, so you should take a "liberal salt" diet.   Please continue to limit how much fluid you drink (maximum 2 quarts per day).  please do the blood t in 1 month.  Please come back for a follow-up appointment in 2 months.

## 2016-07-23 ENCOUNTER — Ambulatory Visit (INDEPENDENT_AMBULATORY_CARE_PROVIDER_SITE_OTHER): Payer: Medicare Other | Admitting: Endocrinology

## 2016-07-23 ENCOUNTER — Encounter: Payer: Self-pay | Admitting: Endocrinology

## 2016-07-23 VITALS — BP 122/80 | HR 78 | Ht 67.0 in | Wt 147.0 lb

## 2016-07-23 DIAGNOSIS — E871 Hypo-osmolality and hyponatremia: Secondary | ICD-10-CM

## 2016-07-23 DIAGNOSIS — E782 Mixed hyperlipidemia: Secondary | ICD-10-CM | POA: Diagnosis not present

## 2016-07-23 NOTE — Patient Instructions (Addendum)
Please continue the same medication, including demeclocycline.   You do not need to limit salt intake, so you should take a "liberal salt" diet.   Please continue to limit how much fluid you drink (maximum 2 quarts per day).  please do the blood t in 1 month.  Please come back for a follow-up appointment in 2 months.

## 2016-07-25 ENCOUNTER — Ambulatory Visit (INDEPENDENT_AMBULATORY_CARE_PROVIDER_SITE_OTHER): Payer: Medicare Other | Admitting: *Deleted

## 2016-07-25 ENCOUNTER — Ambulatory Visit: Payer: BLUE CROSS/BLUE SHIELD | Admitting: Endocrinology

## 2016-07-25 DIAGNOSIS — T63441D Toxic effect of venom of bees, accidental (unintentional), subsequent encounter: Secondary | ICD-10-CM

## 2016-07-26 DIAGNOSIS — R2689 Other abnormalities of gait and mobility: Secondary | ICD-10-CM | POA: Diagnosis not present

## 2016-07-26 DIAGNOSIS — M4854XA Collapsed vertebra, not elsewhere classified, thoracic region, initial encounter for fracture: Secondary | ICD-10-CM | POA: Diagnosis not present

## 2016-07-26 DIAGNOSIS — Z0000 Encounter for general adult medical examination without abnormal findings: Secondary | ICD-10-CM | POA: Diagnosis not present

## 2016-07-26 DIAGNOSIS — M6281 Muscle weakness (generalized): Secondary | ICD-10-CM | POA: Diagnosis not present

## 2016-07-26 DIAGNOSIS — Z91038 Other insect allergy status: Secondary | ICD-10-CM | POA: Diagnosis not present

## 2016-07-26 DIAGNOSIS — E222 Syndrome of inappropriate secretion of antidiuretic hormone: Secondary | ICD-10-CM | POA: Diagnosis not present

## 2016-07-26 DIAGNOSIS — F429 Obsessive-compulsive disorder, unspecified: Secondary | ICD-10-CM | POA: Diagnosis not present

## 2016-07-26 DIAGNOSIS — E782 Mixed hyperlipidemia: Secondary | ICD-10-CM | POA: Diagnosis not present

## 2016-07-26 DIAGNOSIS — N401 Enlarged prostate with lower urinary tract symptoms: Secondary | ICD-10-CM | POA: Diagnosis not present

## 2016-07-26 DIAGNOSIS — H6120 Impacted cerumen, unspecified ear: Secondary | ICD-10-CM | POA: Diagnosis not present

## 2016-07-30 DIAGNOSIS — R2689 Other abnormalities of gait and mobility: Secondary | ICD-10-CM | POA: Diagnosis not present

## 2016-07-30 DIAGNOSIS — M6281 Muscle weakness (generalized): Secondary | ICD-10-CM | POA: Diagnosis not present

## 2016-08-01 ENCOUNTER — Telehealth: Payer: Self-pay | Admitting: Endocrinology

## 2016-08-01 DIAGNOSIS — R2689 Other abnormalities of gait and mobility: Secondary | ICD-10-CM | POA: Diagnosis not present

## 2016-08-01 DIAGNOSIS — M6281 Muscle weakness (generalized): Secondary | ICD-10-CM | POA: Diagnosis not present

## 2016-08-03 DIAGNOSIS — M6281 Muscle weakness (generalized): Secondary | ICD-10-CM | POA: Diagnosis not present

## 2016-08-03 DIAGNOSIS — R2689 Other abnormalities of gait and mobility: Secondary | ICD-10-CM | POA: Diagnosis not present

## 2016-08-06 DIAGNOSIS — M6281 Muscle weakness (generalized): Secondary | ICD-10-CM | POA: Diagnosis not present

## 2016-08-06 DIAGNOSIS — R2689 Other abnormalities of gait and mobility: Secondary | ICD-10-CM | POA: Diagnosis not present

## 2016-08-07 NOTE — Telephone Encounter (Signed)
Patient has a questions about how to take medication, demeclocycline (DECLOMYCIN) 150 MG tablet leave o voice mail if no answer

## 2016-08-08 DIAGNOSIS — M6281 Muscle weakness (generalized): Secondary | ICD-10-CM | POA: Diagnosis not present

## 2016-08-08 DIAGNOSIS — R2689 Other abnormalities of gait and mobility: Secondary | ICD-10-CM | POA: Diagnosis not present

## 2016-08-08 NOTE — Telephone Encounter (Signed)
This med is $200 for just a few pills, can we please advise

## 2016-08-08 NOTE — Telephone Encounter (Signed)
Patient wanted to know if there is alternative medication to the demeclocycline and if not can he take 1 tablet daily instead of 1 tab twice daily? Thanks!

## 2016-08-08 NOTE — Telephone Encounter (Signed)
Ok to reduce to 1 per day.  However, it will be even more important to limit fluid intake. I'll see you next time.

## 2016-08-09 NOTE — Telephone Encounter (Signed)
Patient notified of instructions by stephanie patient had no further questions at this time.

## 2016-08-09 NOTE — Telephone Encounter (Signed)
Attempted to reach the patient. Patient was unavailable and vm was not working. Will try again at a later time.

## 2016-08-10 DIAGNOSIS — M6281 Muscle weakness (generalized): Secondary | ICD-10-CM | POA: Diagnosis not present

## 2016-08-10 DIAGNOSIS — R2689 Other abnormalities of gait and mobility: Secondary | ICD-10-CM | POA: Diagnosis not present

## 2016-08-17 DIAGNOSIS — R2689 Other abnormalities of gait and mobility: Secondary | ICD-10-CM | POA: Diagnosis not present

## 2016-08-17 DIAGNOSIS — M6281 Muscle weakness (generalized): Secondary | ICD-10-CM | POA: Diagnosis not present

## 2016-08-22 ENCOUNTER — Other Ambulatory Visit (INDEPENDENT_AMBULATORY_CARE_PROVIDER_SITE_OTHER): Payer: Medicare Other

## 2016-08-22 DIAGNOSIS — E871 Hypo-osmolality and hyponatremia: Secondary | ICD-10-CM

## 2016-08-22 LAB — BASIC METABOLIC PANEL
BUN: 17 mg/dL (ref 6–23)
CO2: 30 mEq/L (ref 19–32)
Calcium: 9.2 mg/dL (ref 8.4–10.5)
Chloride: 100 mEq/L (ref 96–112)
Creatinine, Ser: 0.94 mg/dL (ref 0.40–1.50)
GFR: 80.72 mL/min (ref >60.00)
Glucose, Bld: 96 mg/dL (ref 70–99)
Potassium: 4.1 mEq/L (ref 3.5–5.1)
Sodium: 134 mEq/L — ABNORMAL LOW (ref 135–145)

## 2016-08-24 ENCOUNTER — Telehealth: Payer: Self-pay | Admitting: Endocrinology

## 2016-08-24 DIAGNOSIS — M6281 Muscle weakness (generalized): Secondary | ICD-10-CM | POA: Diagnosis not present

## 2016-08-24 DIAGNOSIS — R2689 Other abnormalities of gait and mobility: Secondary | ICD-10-CM | POA: Diagnosis not present

## 2016-08-24 NOTE — Telephone Encounter (Signed)
The less the better

## 2016-08-24 NOTE — Telephone Encounter (Signed)
See message and please advise, Thanks!  

## 2016-08-24 NOTE — Telephone Encounter (Signed)
Pt is asking if he can drink a glass of wine or two daily? Please advise

## 2016-08-28 NOTE — Telephone Encounter (Signed)
I contacted the patient and advised of message via voicemail. Patient advised to call back if she had any further questions.

## 2016-08-31 ENCOUNTER — Telehealth: Payer: Self-pay | Admitting: Dietician

## 2016-08-31 NOTE — Telephone Encounter (Signed)
Brief Nutrition Note Patient known to me called stating that he needed a new meal plan because he has been started on Demeclocycline and can have more fluid.  He wants information mailed as he will be out of town.  Chart reviewed.  Patient was started on Demecycline but dose was reduced.  Instructions from MD were to continue to limit fluid and have as little alcohol as possible.  Patient continues to ask about alcohol use.  He is mildly confused.  I will call patient when he is back in town to discuss recommendations to continue fluid guidelines and avoid alcohol and to discuss his concerns.  Antonieta Iba, RD, LDN

## 2016-09-11 DIAGNOSIS — L738 Other specified follicular disorders: Secondary | ICD-10-CM | POA: Diagnosis not present

## 2016-09-11 NOTE — Telephone Encounter (Signed)
Brief nutrition note: Chart including labs reviewed. Spoke with patient today regarding his fluid restriction. Reviewed that his fluid restriction is 8 cups daily.   Per MD recommended that he keep his alcohol intake to as little as possible.  He is very adamant that he would like 1-2 glasses of wine daily.   Will mail him an updated fluid plan as he requests. He gets confused about his recommendations unless very specific information is provided.  Plan is as below: Breakfast:  1 cup coffee with cream         1 cup orange or other juice         1/2 cup milk Snack:        1 cup gatorade Lunch:         1 cup water or other fluid Snack:         1 cup gatorade Dinner:         1 cup water           1 cup lemonade or ice cream                      1 cup soup (counts as 1/2-1 cup fluid)  If he does not want the fluid listed then he can substitute. When he has the [employer] non-alcoholic beer, this counts as 1 1/2 cups of liquid and then needs to skip the soup and water with dinner.  Antonieta Iba, RD, LDN

## 2016-09-14 ENCOUNTER — Telehealth: Payer: Self-pay | Admitting: Dietician

## 2016-09-14 NOTE — Telephone Encounter (Signed)
Brief Nutrition Note Patient called to ask why he was on a medicine for the hyponatremia if he was also on a fluid restriction.  Was the medicine working?   Patient has an appointment with Dr. Loanne Drilling Tuesday and was instructed to ask him. Patient reviewed his fluid restriction, asked questions about this and verbalized compliance to the detailed 2 L (8 cup) fluid restriction. Patient to call for questions prn.  Antonieta Iba, RD, LDN

## 2016-09-16 NOTE — Progress Notes (Signed)
Subjective:    Patient ID: Adam Cota., male    DOB: 1927-09-12, 83 y.o.   MRN: [id number]  HPI Pt returns for f/u of hyponatremia (dx'ed 2012; no secondary cause was found; VP level was undetectable, and urine osm was 484, so nephrogenic cause is presumed; CT showed no evidence of hydronephrosis or urinary calculi; he was rx'ed declomycin, which [facility]).  He reports ongoing slight weakness of the legs.  He is still working on fluid restriction.  He takes demeclocycline, 150 mg qd (not BID, as rx'ed).   Past Medical History:  Diagnosis Date  . Anxiety   . Colon polyp    hyperplastic  . Diverticulosis of colon (without mention of hemorrhage)   . Esophageal stricture   . GERD (gastroesophageal reflux disease)   . Hiatal hernia   . OCD (obsessive compulsive disorder)   . Viral hepatitis 08/1983    Past Surgical History:  Procedure Laterality Date  . COLONOSCOPY    . [place]    Social History   Social History  . Marital status: Married    Spouse name: N/A  . Number of children: 2  . Years of education: N/A   Occupational History  . retired    Social History Main Topics  . Smoking status: Never Smoker  . Smokeless tobacco: Never Used  . Alcohol use 3.5 oz/week    7 Standard drinks or equivalent per week     Comment: 1 glass of wine a day  . Drug use: No  . Sexual activity: Not on file   Other Topics Concern  . Not on file   Social History Narrative  . No narrative on file    Current Outpatient Prescriptions on File Prior to Visit  Medication Sig Dispense Refill  . Cholecalciferol (VITAMIN D) 1000 UNITS capsule Take 1,000 Units by mouth daily.      Marland Kitchen demeclocycline (DECLOMYCIN) 150 MG tablet Take 150 mg by mouth 2 (two) times daily.    Marland Kitchen EPINEPHrine (EPI-PEN) 0.3 mg/0.3 mL DEVI Inject 0.3 mg into the muscle once.    Marland Kitchen LORazepam (ATIVAN) 1 MG tablet Take 1-2 tablets (1-2 mg total) by mouth 2 (two) times daily. Take 1 tablet at  noon and 2 tablets at bedtime 30 tablet 0  . Multiple Vitamins-Minerals (PRESERVISION AREDS) CAPS Take 1 capsule by mouth 2 (two) times daily.    . polyethylene glycol (MIRALAX / GLYCOLAX) packet Take 17 g by mouth daily.      . sodium chloride (OCEAN) 0.65 % SOLN nasal spray Place 1 spray into both nostrils 3 (three) times daily as needed for congestion.     No current facility-administered medications on file prior to visit.     Allergies  Allergen Reactions  . Bee Venom Anaphylaxis    Family History  Problem Relation Age of Onset  . Diverticulosis Mother   . Melanoma Mother   . Lung cancer Father   . Colon cancer Other     1st cousion.  . Allergic rhinitis Neg Hx   . Angioedema Neg Hx   . Asthma Neg Hx   . Eczema Neg Hx   . Immunodeficiency Neg Hx   . Urticaria Neg Hx     BP 102/64   Pulse 81   Ht 5\' 7"  (1.702 m)   Wt 149 lb (67.6 kg)   SpO2 94%   BMI 23.34 kg/m    Review of Systems  Denies sob and headache.      Objective:   Physical Exam VITAL SIGNS:  See vs page GENERAL: no distress.   Ext: no edema.    Lab Results  Component Value Date   CREATININE 1.08 09/18/2016   BUN 22 09/18/2016   NA 135 09/18/2016   K 4.1 09/18/2016   CL 103 09/18/2016   CO2 26 09/18/2016      Assessment & Plan:  Hyponatremia: improved, but he is at risk for recurrence. Patient is advised the following: Patient Instructions  Please increase the demeclocyline to 1 pill, twice a day.    You do not need to limit salt intake, so you should eat a "liberal salt" diet.   Please continue to limit how much fluid you drink (maximum 2 quarts per day).   Please come back for a follow-up appointment in 1-2 months.

## 2016-09-18 ENCOUNTER — Ambulatory Visit (INDEPENDENT_AMBULATORY_CARE_PROVIDER_SITE_OTHER): Payer: Medicare Other | Admitting: Endocrinology

## 2016-09-18 ENCOUNTER — Ambulatory Visit (INDEPENDENT_AMBULATORY_CARE_PROVIDER_SITE_OTHER): Payer: Medicare Other | Admitting: *Deleted

## 2016-09-18 VITALS — BP 102/64 | HR 81 | Ht 67.0 in | Wt 149.0 lb

## 2016-09-18 DIAGNOSIS — E871 Hypo-osmolality and hyponatremia: Secondary | ICD-10-CM | POA: Diagnosis not present

## 2016-09-18 DIAGNOSIS — T63441D Toxic effect of venom of bees, accidental (unintentional), subsequent encounter: Secondary | ICD-10-CM | POA: Diagnosis not present

## 2016-09-18 LAB — BASIC METABOLIC PANEL
BUN: 22 mg/dL (ref 6–23)
CO2: 26 mEq/L (ref 19–32)
Calcium: 9.1 mg/dL (ref 8.4–10.5)
Chloride: 103 mEq/L (ref 96–112)
Creatinine, Ser: 1.08 mg/dL (ref 0.40–1.50)
GFR: 68.76 mL/min (ref >60.00)
Glucose, Bld: 110 mg/dL — ABNORMAL HIGH (ref 70–99)
Potassium: 4.1 mEq/L (ref 3.5–5.1)
Sodium: 135 mEq/L (ref 135–145)

## 2016-09-18 NOTE — Patient Instructions (Addendum)
Please increase the demeclocyline to 1 pill, twice a day.    You do not need to limit salt intake, so you should eat a "liberal salt" diet.   Please continue to limit how much fluid you drink (maximum 2 quarts per day).   Please come back for a follow-up appointment in 1-2 months.

## 2016-09-19 ENCOUNTER — Telehealth: Payer: Self-pay | Admitting: Endocrinology

## 2016-09-19 DIAGNOSIS — L720 Epidermal cyst: Secondary | ICD-10-CM | POA: Diagnosis not present

## 2016-09-19 DIAGNOSIS — L821 Other seborrheic keratosis: Secondary | ICD-10-CM | POA: Diagnosis not present

## 2016-09-19 NOTE — Telephone Encounter (Signed)
Letter mailed to the patient

## 2016-09-19 NOTE — Telephone Encounter (Signed)
Patient asked if his labs can be mailed to him

## 2016-09-24 ENCOUNTER — Telehealth: Payer: Self-pay | Admitting: Dietician

## 2016-09-24 NOTE — Telephone Encounter (Signed)
Brief nutrition note: Chart including MD not and labs,  He called again stating that his sodium was now normal and could he have a glass of wine once a week.  Discussed that he should drink as little as possible per MD note.    He asked also about his medication for the hyponatremia and could he just take one pill per day.  Discussed that the MD wanted him to increase to 2 per day.  Patient stated that he would just take one a day and see what his labs do and then ask MD at his next visit.    He reviewed his fluid restriction with me which was WNL.  Antonieta Iba, RD, LDN

## 2016-10-18 ENCOUNTER — Encounter: Payer: Self-pay | Admitting: Endocrinology

## 2016-10-18 ENCOUNTER — Ambulatory Visit (INDEPENDENT_AMBULATORY_CARE_PROVIDER_SITE_OTHER): Payer: Medicare Other | Admitting: Endocrinology

## 2016-10-18 VITALS — BP 118/80 | HR 70 | Wt 147.6 lb

## 2016-10-18 DIAGNOSIS — E871 Hypo-osmolality and hyponatremia: Secondary | ICD-10-CM

## 2016-10-18 LAB — BASIC METABOLIC PANEL
BUN: 21 mg/dL (ref 6–23)
CO2: 31 mEq/L (ref 19–32)
Calcium: 9.1 mg/dL (ref 8.4–10.5)
Chloride: 102 mEq/L (ref 96–112)
Creatinine, Ser: 1.08 mg/dL (ref 0.40–1.50)
GFR: 68.74 mL/min (ref >60.00)
Glucose, Bld: 96 mg/dL (ref 70–99)
Potassium: 4 mEq/L (ref 3.5–5.1)
Sodium: 136 mEq/L (ref 135–145)

## 2016-10-18 NOTE — Progress Notes (Signed)
Subjective:    Patient ID: Adam Cota., male    DOB: Sep 07, 1928, 83 y.o.   MRN: [id number]  HPI Pt returns for f/u of hyponatremia (dx'ed 2012; no secondary cause was found; VP level was undetectable, and urine osm was 484, so nephrogenic cause is presumed; CT showed no evidence of hydronephrosis or urinary calculi; he was rx'ed decl[facility], which [facility]).  He reports ongoing slight unsteadiness on the feet.  He is still working on fluid restriction.  Past Medical History:  Diagnosis Date  . Anxiety   . Colon polyp    hyperplastic  . Diverticulosis of colon (without mention of hemorrhage)   . Esophageal stricture   . GERD (gastroesophageal reflux disease)   . Hiatal hernia   . OCD (obsessive compulsive disorder)   . Viral hepatitis 08/1983    Past Surgical History:  Procedure Laterality Date  . COLONOSCOPY    . [place]    Social History   Social History  . Marital status: Married    Spouse name: N/A  . Number of children: 2  . Years of education: N/A   Occupational History  . retired    Social History Main Topics  . Smoking status: Never Smoker  . Smokeless tobacco: Never Used  . Alcohol use 3.5 oz/week    7 Standard drinks or equivalent per week     Comment: 1 glass of wine a day  . Drug use: No  . Sexual activity: Not on file   Other Topics Concern  . Not on file   Social History Narrative  . No narrative on file    Current Outpatient Prescriptions on File Prior to Visit  Medication Sig Dispense Refill  . Cholecalciferol (VITAMIN D) 1000 UNITS capsule Take 1,000 Units by mouth daily.      Marland Kitchen demeclocycline (DECLOMYCIN) 150 MG tablet Take 150 mg by mouth 2 (two) times daily.    Marland Kitchen EPINEPHrine (EPI-PEN) 0.3 mg/0.3 mL DEVI Inject 0.3 mg into the muscle once.    Marland Kitchen LORazepam (ATIVAN) 1 MG tablet Take 1-2 tablets (1-2 mg total) by mouth 2 (two) times daily. Take 1 tablet at noon and 2 tablets at bedtime 30 tablet 0  . Multiple  Vitamins-Minerals (PRESERVISION AREDS) CAPS Take 1 capsule by mouth 2 (two) times daily.    . polyethylene glycol (MIRALAX / GLYCOLAX) packet Take 17 g by mouth daily.      . sodium chloride (OCEAN) 0.65 % SOLN nasal spray Place 1 spray into both nostrils 3 (three) times daily as needed for congestion.     No current facility-administered medications on file prior to visit.     Allergies  Allergen Reactions  . Bee Venom Anaphylaxis    Family History  Problem Relation Age of Onset  . Diverticulosis Mother   . Melanoma Mother   . Lung cancer Father   . Colon cancer Other     1st cousion.  . Allergic rhinitis Neg Hx   . Angioedema Neg Hx   . Asthma Neg Hx   . Eczema Neg Hx   . Immunodeficiency Neg Hx   . Urticaria Neg Hx     BP 118/80 (BP Location: Left Arm, Patient Position: Sitting, Cuff Size: Normal)   Pulse 70   Wt 147 lb 9.6 oz (67 kg)   SpO2 94%   BMI 23.12 kg/m    Review of Systems Denies LOC    Objective:  Physical Exam VITAL SIGNS:  See vs page. GENERAL: no distress.   Ext: 1+ bilat leg edema.   Lab Results  Component Value Date   CREATININE 1.08 10/18/2016   BUN 21 10/18/2016   NA 136 10/18/2016   K 4.0 10/18/2016   CL 102 10/18/2016   CO2 31 10/18/2016       Assessment & Plan:  Hyponatremia: well-controlled Edema: persistent: appears separate from hyponatremia.  This might be due to liberal salt diet. we'll follow for now.  Patient is advised the following: Patient Instructions  Please continue the same demeclocyline.   blood tests are requested for you today.  We'll let you know about the results.  You do not need to limit salt intake, so you should eat a "liberal salt" diet.   Please continue to limit how much fluid you drink (maximum 2 quarts per day).   Please come back for a follow-up appointment in 1-2 months.

## 2016-10-18 NOTE — Progress Notes (Signed)
Pre visit review using our clinic review tool, if applicable. No additional management support is needed unless otherwise documented below in the visit note. 

## 2016-10-18 NOTE — Patient Instructions (Addendum)
Please continue the same demeclocyline.   blood tests are requested for you today.  We'll let you know about the results.  You do not need to limit salt intake, so you should eat a "liberal salt" diet.   Please continue to limit how much fluid you drink (maximum 2 quarts per day).   Please come back for a follow-up appointment in 1-2 months.

## 2016-10-21 ENCOUNTER — Telehealth: Payer: Self-pay | Admitting: Endocrinology

## 2016-10-21 NOTE — Telephone Encounter (Signed)
please call patient: You have requested a letter.  What should the letter say?

## 2016-10-22 ENCOUNTER — Telehealth: Payer: Self-pay

## 2016-10-22 NOTE — Telephone Encounter (Signed)
Patient wanted to know specifically the time he should be taking his declomycin. Patient stated he was confused on the time. He also wanted to know if he could start having a glass of wine?   Please advise, Thanks!

## 2016-10-22 NOTE — Telephone Encounter (Signed)
Patient was requesting a copy of his lab results. Result put in a letter and mailed to the patient.

## 2016-10-22 NOTE — Telephone Encounter (Signed)
No, this is a good result.  Please continue the same medication

## 2016-10-22 NOTE — Telephone Encounter (Signed)
Patient called about results from 10/18/2016. Patient wanted to know since his previous blood test was 135 and his most recent result was 136 if he should continue the declomycin. Patient stated he thought his blood test should show a bigger improvement on this medication. Please advise, Thanks!

## 2016-10-22 NOTE — Telephone Encounter (Signed)
I contacted the patient and advised of message. Patient voiced understanding.  

## 2016-10-23 ENCOUNTER — Telehealth: Payer: Self-pay | Admitting: Endocrinology

## 2016-10-23 NOTE — Telephone Encounter (Signed)
Take the demeclocycline twice a day--morning and evening.  As for alcohol, the less the better.

## 2016-10-23 NOTE — Telephone Encounter (Signed)
I contacted the patient and advised of message via voicemail. Requested a call back if the patient would like to discuss further.  

## 2016-10-24 NOTE — Telephone Encounter (Signed)
Letter mailed on 10/22/2016.

## 2016-10-24 NOTE — Telephone Encounter (Signed)
Patient ask you to send his lab results in the mail.

## 2016-10-25 DIAGNOSIS — F339 Major depressive disorder, recurrent, unspecified: Secondary | ICD-10-CM | POA: Diagnosis not present

## 2016-11-13 ENCOUNTER — Ambulatory Visit: Payer: Self-pay | Admitting: *Deleted

## 2016-11-13 ENCOUNTER — Ambulatory Visit (INDEPENDENT_AMBULATORY_CARE_PROVIDER_SITE_OTHER): Payer: Medicare Other | Admitting: Allergy and Immunology

## 2016-11-13 ENCOUNTER — Encounter: Payer: Self-pay | Admitting: Allergy and Immunology

## 2016-11-13 VITALS — BP 108/68 | HR 74 | Temp 98.0°F | Resp 16 | Ht 64.5 in | Wt 145.6 lb

## 2016-11-13 DIAGNOSIS — T63441D Toxic effect of venom of bees, accidental (unintentional), subsequent encounter: Secondary | ICD-10-CM | POA: Diagnosis not present

## 2016-11-13 DIAGNOSIS — Z91038 Other insect allergy status: Secondary | ICD-10-CM | POA: Diagnosis not present

## 2016-11-13 DIAGNOSIS — Z91030 Bee allergy status: Secondary | ICD-10-CM

## 2016-11-13 NOTE — Progress Notes (Signed)
Follow-up Note  RE: Kewon Statler. MRN: [id number] DOB: Feb 12, 1928 Date of Office Visit: 11/13/2016  Primary care provider: Gennette Pac, MD Referring provider: Hulan Fess, MD  History of present illness: Shonn Farruggia. 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  He reports that he has been on venom immunotherapy for approximately 15 years and is interested in discontinuing this therapy.  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  He continues to carry epi pens in case of a sting followed by systemic symptoms.   Assessment and plan: Hymenoptera venom hypersensitivity Has he has been on venom immunotherapy for 15 years it is reasonable to consider discontinuing at this point, especially since he is not at high risk regarding exposure.  Laboratory order for tryptase level and serum specific IgE against hymenoptera venom panel.  For now, continue to have access to epinephrine autoinjector 2 pack in case of sting followed by systemic symptoms.   Physical examination: Blood pressure 108/68, pulse 74, temperature 98 F (36.7 C), temperature source Oral, resp. rate 16, height 5' 4.5" (1.638 m), weight 145 lb 9.6 oz (66 kg), SpO2 96 %.  General: Alert, interactive, in no acute distress. Neck: Supple without lymphadenopathy. Lungs: Clear to auscultation without wheezing, rhonchi or rales. CV: Normal S1, S2 without murmurs. Skin: Warm and dry, without lesions or rashes.  The following portions of the patient's history were reviewed and updated as appropriate: allergies, current medications, past family history, past medical history, past social history, past surgical history and problem list.  Allergies as of 11/13/2016      Reactions   Bee Venom Anaphylaxis   Other reaction(s): Other (See Comments) Loss of consciousness   Nsaids    Other  reaction(s): intol   Other Other (See Comments)      Medication List       Accurate as of 11/13/16  7:20 PM. Always use your most recent med list.          demeclocycline 150 MG tablet Commonly known as:  DECLOMYCIN Take 150 mg by mouth 2 (two) times daily.   EPINEPHrine 0.3 mg/0.3 mL Devi Commonly known as:  EPI-PEN Inject 0.3 mg into the muscle once.   LORazepam 1 MG tablet Commonly known as:  ATIVAN Take 1-2 tablets (1-2 mg total) by mouth 2 (two) times daily. Take 1 tablet at noon and 2 tablets at bedtime   polyethylene glycol packet Commonly known as:  MIRALAX / GLYCOLAX Take 17 g by mouth daily.   PRESERVISION AREDS Caps Take 1 capsule by mouth 2 (two) times daily.   sodium chloride 0.65 % Soln nasal spray Commonly known as:  OCEAN Place 1 spray into both nostrils 3 (three) times daily as needed for congestion.   tamsulosin 0.4 MG Caps capsule Commonly known as:  FLOMAX 1 capsule   VENOMIL WASP VENOM 120 MCG Kit Generic drug:  Wasp Venom   Vitamin D 1000 units capsule Take 1,000 Units by mouth daily.   WELLBUTRIN XL 150 MG 24 hr tablet Generic drug:  buPROPion 1 tablet in the morning for 6 days then 2 qd       Allergies  Allergen Reactions  . Bee Venom Anaphylaxis    Other reaction(s): Other (See Comments) Loss of consciousness  . Nsaids     Other reaction(s): intol  . Other Other (  See Comments)    I appreciate the opportunity to take part in Lukis's care. Please do not hesitate to contact me with questions.  Sincerely,   R. Edgar Frisk, MD

## 2016-11-13 NOTE — Assessment & Plan Note (Signed)
Has he has been on venom immunotherapy for 15 years it is reasonable to consider discontinuing at this point, especially since he is not at high risk regarding exposure.  Laboratory order for tryptase level and serum specific IgE against hymenoptera venom panel.  For now, continue to have access to epinephrine autoinjector 2 pack in case of sting followed by systemic symptoms.

## 2016-11-13 NOTE — Patient Instructions (Signed)
Hymenoptera venom hypersensitivity Has he has been on venom immunotherapy for 15 years it is reasonable to consider discontinuing at this point, especially since he is not at high risk regarding exposure.  Laboratory order for tryptase level and serum specific IgE against hymenoptera venom panel.  For now, continue to have access to epinephrine autoinjector 2 pack in case of sting followed by systemic symptoms.   When lab results have returned the patient will be called with further recommendations and follow up instructions.

## 2016-11-14 ENCOUNTER — Ambulatory Visit (INDEPENDENT_AMBULATORY_CARE_PROVIDER_SITE_OTHER): Payer: Medicare Other | Admitting: Endocrinology

## 2016-11-14 ENCOUNTER — Encounter: Payer: Self-pay | Admitting: Endocrinology

## 2016-11-14 ENCOUNTER — Other Ambulatory Visit: Payer: Self-pay | Admitting: Allergy and Immunology

## 2016-11-14 VITALS — BP 126/84 | HR 68 | Temp 98.1°F | Ht 64.5 in | Wt 147.0 lb

## 2016-11-14 DIAGNOSIS — T63441D Toxic effect of venom of bees, accidental (unintentional), subsequent encounter: Secondary | ICD-10-CM

## 2016-11-14 DIAGNOSIS — E871 Hypo-osmolality and hyponatremia: Secondary | ICD-10-CM | POA: Diagnosis not present

## 2016-11-14 LAB — BASIC METABOLIC PANEL
BUN: 19 mg/dL (ref 6–23)
CO2: 30 mEq/L (ref 19–32)
Calcium: 9.6 mg/dL (ref 8.4–10.5)
Chloride: 101 mEq/L (ref 96–112)
Creatinine, Ser: 1.08 mg/dL (ref 0.40–1.50)
GFR: 68.73 mL/min (ref >60.00)
Glucose, Bld: 94 mg/dL (ref 70–99)
Potassium: 4.7 mEq/L (ref 3.5–5.1)
Sodium: 135 mEq/L (ref 135–145)

## 2016-11-14 NOTE — Patient Instructions (Signed)
Please continue the same demeclocyline.   blood tests are requested for you today.  We'll let you know about the results.  You do not need to limit salt intake, so you should eat a "liberal salt" diet.   Please continue to limit how much fluid you drink (maximum 2 quarts per day).   The less alcohol you drink, the better.  None is the best option.   Please come back for a follow-up appointment in 1-2 months.

## 2016-11-14 NOTE — Progress Notes (Signed)
Subjective:    Patient ID: Adam Cota., male    DOB: 25-Jan-1928, 83 y.o.   MRN: [id number]  HPI Pt returns for f/u of hyponatremia (dx'ed 2012; no secondary cause was found; VP level was undetectable, and urine osm was 484, so nephrogenic cause is presumed; CT showed no evidence of hydronephrosis or urinary calculi; he was rx'ed declomycin, which [facility]).  He reports ongoing slight unsteadiness on the feet.  He is still working on fluid restriction.  He wants to resume drinking wine, 1 glass per day.   Past Medical History:  Diagnosis Date  . Anxiety   . Colon polyp    hyperplastic  . Diverticulosis of colon (without mention of hemorrhage)   . Esophageal stricture   . GERD (gastroesophageal reflux disease)   . Hiatal hernia   . OCD (obsessive compulsive disorder)   . Viral hepatitis 08/1983    Past Surgical History:  Procedure Laterality Date  . COLONOSCOPY    . [place]    Social History   Social History  . Marital status: Married    Spouse name: N/A  . Number of children: 2  . Years of education: N/A   Occupational History  . retired    Social History Main Topics  . Smoking status: Never Smoker  . Smokeless tobacco: Never Used  . Alcohol use 3.5 oz/week    7 Standard drinks or equivalent per week     Comment: 1 glass of wine a day  . Drug use: No  . Sexual activity: Not on file   Other Topics Concern  . Not on file   Social History Narrative  . No narrative on file    Current Outpatient Prescriptions on File Prior to Visit  Medication Sig Dispense Refill  . buPROPion (WELLBUTRIN XL) 150 MG 24 hr tablet 1 tablet in the morning    . Cholecalciferol (VITAMIN D) 1000 UNITS capsule Take 1,000 Units by mouth daily.      Marland Kitchen demeclocycline (DECLOMYCIN) 150 MG tablet Take 150 mg by mouth 2 (two) times daily.    Marland Kitchen EPINEPHrine (EPI-PEN) 0.3 mg/0.3 mL DEVI Inject 0.3 mg into the muscle once.    Marland Kitchen LORazepam (ATIVAN) 1 MG tablet Take  1-2 tablets (1-2 mg total) by mouth 2 (two) times daily. Take 1 tablet at noon and 2 tablets at bedtime 30 tablet 0  . Multiple Vitamins-Minerals (PRESERVISION AREDS) CAPS Take 1 capsule by mouth 2 (two) times daily.    . polyethylene glycol (MIRALAX / GLYCOLAX) packet Take 17 g by mouth daily.      . sodium chloride (OCEAN) 0.65 % SOLN nasal spray Place 1 spray into both nostrils 3 (three) times daily as needed for congestion.    . Wasp Venom (VENOMIL WASP VENOM) 120 MCG KIT      No current facility-administered medications on file prior to visit.     Allergies  Allergen Reactions  . Bee Venom Anaphylaxis    Other reaction(s): Other (See Comments) Loss of consciousness  . Nsaids     Other reaction(s): intol  . Other Other (See Comments)    Family History  Problem Relation Age of Onset  . Diverticulosis Mother   . Melanoma Mother   . Lung cancer Father   . Colon cancer Other     1st cousion.  . Allergic rhinitis Neg Hx   . Angioedema Neg Hx   . Asthma Neg Hx   .  Eczema Neg Hx   . Immunodeficiency Neg Hx   . Urticaria Neg Hx     BP 126/84   Pulse 68   Temp 98.1 F (36.7 C) (Oral)   Ht 5' 4.5" (1.638 m)   Wt 147 lb (66.7 kg)   SpO2 98%   BMI 24.84 kg/m    Review of Systems Denies edema    Objective:   Physical Exam Vital signs: see vs page.  Gen: elderly, frail, no distress.  Ext: no edema.   Lab Results  Component Value Date   CREATININE 1.08 11/14/2016   BUN 19 11/14/2016   NA 135 11/14/2016   K 4.7 11/14/2016   CL 101 11/14/2016   CO2 30 11/14/2016      Assessment & Plan:  Hyponatremia: well-controlled.  Please continue the same medication.   Patient is advised the following: Patient Instructions  Please continue the same demeclocyline.   blood tests are requested for you today.  We'll let you know about the results.  You do not need to limit salt intake, so you should eat a "liberal salt" diet.   Please continue to limit how much fluid you  drink (maximum 2 quarts per day).   The less alcohol you drink, the better.  None is the best option.   Please come back for a follow-up appointment in 1-2 months.

## 2016-11-15 NOTE — Telephone Encounter (Signed)
I contacted the patient and advised labs from 11/14/2016 were normal. ( Sodium was 135). Patient advised to call back to discuss if he had any further questions.

## 2016-11-15 NOTE — Telephone Encounter (Signed)
Patient ask you to give him a call °

## 2016-11-23 DIAGNOSIS — Z91038 Other insect allergy status: Secondary | ICD-10-CM | POA: Diagnosis not present

## 2016-11-27 LAB — TRYPTASE: Tryptase: 2.6 ug/L (ref <11)

## 2016-11-30 ENCOUNTER — Telehealth: Payer: Self-pay

## 2016-11-30 NOTE — Telephone Encounter (Signed)
Patient called back about lab result message. He stated that he would like to discontinue the injections, then he said that he is just going to continue them. Advised patient that was fine by the doctor either way.

## 2016-12-04 DIAGNOSIS — L821 Other seborrheic keratosis: Secondary | ICD-10-CM | POA: Diagnosis not present

## 2016-12-12 DIAGNOSIS — F339 Major depressive disorder, recurrent, unspecified: Secondary | ICD-10-CM | POA: Diagnosis not present

## 2016-12-18 DIAGNOSIS — D3131 Benign neoplasm of right choroid: Secondary | ICD-10-CM | POA: Diagnosis not present

## 2016-12-18 DIAGNOSIS — H35033 Hypertensive retinopathy, bilateral: Secondary | ICD-10-CM | POA: Diagnosis not present

## 2016-12-18 DIAGNOSIS — H353130 Nonexudative age-related macular degeneration, bilateral, stage unspecified: Secondary | ICD-10-CM | POA: Diagnosis not present

## 2016-12-18 DIAGNOSIS — H04123 Dry eye syndrome of bilateral lacrimal glands: Secondary | ICD-10-CM | POA: Diagnosis not present

## 2016-12-18 DIAGNOSIS — Z961 Presence of intraocular lens: Secondary | ICD-10-CM | POA: Diagnosis not present

## 2016-12-19 ENCOUNTER — Ambulatory Visit (INDEPENDENT_AMBULATORY_CARE_PROVIDER_SITE_OTHER): Payer: Medicare Other | Admitting: Endocrinology

## 2016-12-19 ENCOUNTER — Encounter: Payer: Self-pay | Admitting: Endocrinology

## 2016-12-19 VITALS — BP 126/74 | HR 75 | Ht 64.0 in | Wt 147.0 lb

## 2016-12-19 DIAGNOSIS — E871 Hypo-osmolality and hyponatremia: Secondary | ICD-10-CM | POA: Diagnosis not present

## 2016-12-19 LAB — BASIC METABOLIC PANEL
BUN: 21 mg/dL (ref 6–23)
CO2: 27 mEq/L (ref 19–32)
Calcium: 9 mg/dL (ref 8.4–10.5)
Chloride: 98 mEq/L (ref 96–112)
Creatinine, Ser: 1.1 mg/dL (ref 0.40–1.50)
GFR: 67.28 mL/min (ref >60.00)
Glucose, Bld: 92 mg/dL (ref 70–99)
Potassium: 4 mEq/L (ref 3.5–5.1)
Sodium: 131 mEq/L — ABNORMAL LOW (ref 135–145)

## 2016-12-19 LAB — HEPATIC FUNCTION PANEL
ALT: 19 U/L (ref 0–53)
AST: 20 U/L (ref 0–37)
Albumin: 3.7 g/dL (ref 3.5–5.2)
Alkaline Phosphatase: 68 U/L (ref 39–117)
Bilirubin, Direct: 0.1 mg/dL (ref 0.0–0.3)
Total Bilirubin: 0.7 mg/dL (ref 0.2–1.2)
Total Protein: 6.6 g/dL (ref 6.0–8.3)

## 2016-12-19 NOTE — Patient Instructions (Addendum)
Please continue the same demeclocyline.   blood tests are requested for you today.  We'll let you know about the results.  You do not need to limit salt intake, so you should eat a "liberal salt" diet.   Please continue to limit how much fluid you drink (maximum 2 quarts per day).   The less alcohol you drink, the better.  None is best.  Please come back for a follow-up appointment in 1-2 months.

## 2016-12-19 NOTE — Progress Notes (Signed)
Subjective:    Patient ID: Adam Cota., male    DOB: [birth year], 83 y.o.   MRN: [id number]  HPI Pt returns for f/u of hyponatremia (dx'ed 2012; no secondary cause was found; VP level was undetectable, and urine osm was 484, so nephrogenic cause is presumed; CT showed no evidence of hydronephrosis or urinary calculi; he was rx'ed declomycin, which [facility]).  pt states he feels well in general.   Past Medical History:  Diagnosis Date  . Anxiety   . Colon polyp    hyperplastic  . Diverticulosis of colon (without mention of hemorrhage)   . Esophageal stricture   . GERD (gastroesophageal reflux disease)   . Hiatal hernia   . OCD (obsessive compulsive disorder)   . Viral hepatitis 08/1983    Past Surgical History:  Procedure Laterality Date  . COLONOSCOPY    . [place]    Social History   Social History  . Marital status: Married    Spouse name: N/A  . Number of children: 2  . Years of education: N/A   Occupational History  . retired    Social History Main Topics  . Smoking status: Never Smoker  . Smokeless tobacco: Never Used  . Alcohol use 3.5 oz/week    7 Standard drinks or equivalent per week     Comment: 1 glass of wine a day  . Drug use: No  . Sexual activity: Not on file   Other Topics Concern  . Not on file   Social History Narrative  . No narrative on file    Current Outpatient Prescriptions on File Prior to Visit  Medication Sig Dispense Refill  . buPROPion (WELLBUTRIN XL) 150 MG 24 hr tablet 2 tablets in the morning    . Cholecalciferol (VITAMIN D) 1000 UNITS capsule Take 1,000 Units by mouth daily.      Marland Kitchen demeclocycline (DECLOMYCIN) 150 MG tablet Take 150 mg by mouth 2 (two) times daily.    Marland Kitchen EPINEPHrine (EPI-PEN) 0.3 mg/0.3 mL DEVI Inject 0.3 mg into the muscle once.    Marland Kitchen LORazepam (ATIVAN) 1 MG tablet Take 1-2 tablets (1-2 mg total) by mouth 2 (two) times daily. Take 1 tablet at noon and 2 tablets at bedtime 30  tablet 0  . Multiple Vitamins-Minerals (PRESERVISION AREDS) CAPS Take 1 capsule by mouth 2 (two) times daily.    . polyethylene glycol (MIRALAX / GLYCOLAX) packet Take 17 g by mouth daily.      . sodium chloride (OCEAN) 0.65 % SOLN nasal spray Place 1 spray into both nostrils 3 (three) times daily as needed for congestion.    . Wasp Venom (VENOMIL WASP VENOM) 120 MCG KIT      No current facility-administered medications on file prior to visit.     Allergies  Allergen Reactions  . Bee Venom Anaphylaxis    Other reaction(s): Other (See Comments) Loss of consciousness  . Nsaids     Other reaction(s): intol  . Other Other (See Comments)    Family History  Problem Relation Age of Onset  . Diverticulosis Mother   . Melanoma Mother   . Lung cancer Father   . Colon cancer Other     1st cousion.  . Allergic rhinitis Neg Hx   . Angioedema Neg Hx   . Asthma Neg Hx   . Eczema Neg Hx   . Immunodeficiency Neg Hx   . Urticaria Neg Hx  BP 126/74   Pulse 75   Ht _0  (1.626 m)   Wt 147 lb (66.7 kg)   SpO2 96%   BMI 25.23 kg/m    Review of Systems Denies n/v/headache.     Objective:   Physical Exam Vital signs: see vs page.  Gen: elderly, frail, no distress.  Gait: normal and steady.   Ext: no edema.    Lab Results  Component Value Date   CREATININE 1.10 12/19/2016   BUN 21 12/19/2016   NA 131 (L) 12/19/2016   K 4.0 12/19/2016   CL 98 12/19/2016   CO2 27 12/19/2016      Assessment & Plan:  Hyponatremia: slightly worse.   Patient Instructions  Please continue the same demeclocyline.   blood tests are requested for you today.  We'll let you know about the results.  You do not need to limit salt intake, so you should eat a "liberal salt" diet.   Please continue to limit how much fluid you drink (maximum 2 quarts per day).   The less alcohol you drink, the better.  None is best.  Please come back for a follow-up appointment in 1-2 months.

## 2016-12-20 ENCOUNTER — Telehealth: Payer: Self-pay

## 2016-12-20 ENCOUNTER — Telehealth: Payer: Self-pay | Admitting: Endocrinology

## 2016-12-20 NOTE — Telephone Encounter (Signed)
Patient is requesting a letter of instruction for a liquid plan sample due to sodium level dropping. Patient asked to speak to Jinny Blossom again to further advise.

## 2016-12-20 NOTE — Telephone Encounter (Signed)
Pt called back in and wanted to speak to [facility].  He said he got your message, but he wants to know why it is that his Sodium has dropped. Please advise.

## 2016-12-20 NOTE — Telephone Encounter (Signed)
-----   Message from Renato Shin, MD sent at 12/19/2016  5:49 PM EDT ----- please call patient: Liver is normal, but the sodium is low again. Please continue the same medication. Also, please continue to liberally salt your food.   I'll see you next time.

## 2016-12-20 NOTE — Telephone Encounter (Signed)
I contacted the patient and advised of results. Labs printed and mailed to the patient.

## 2016-12-20 NOTE — Telephone Encounter (Signed)
It will fluctuate on its own, but this is just slightly low.

## 2016-12-20 NOTE — Telephone Encounter (Signed)
See message and please advise, Thanks!  

## 2016-12-20 NOTE — Telephone Encounter (Signed)
I need to know what this means: instruction for a liquid plan sample due to sodium level dropping.

## 2016-12-21 NOTE — Telephone Encounter (Signed)
He wants a reprint of what Adam Swanson gave him.  Can this message be forwarded to her?

## 2016-12-21 NOTE — Telephone Encounter (Signed)
I contacted the patient and advised of MD's message. He voiced understanding and had no further questions at this time. Message fwd to Antonieta Iba for her to call the patient once she returns to the office on 12/24/2016.

## 2016-12-21 NOTE — Telephone Encounter (Signed)
Patient is requested a liquid intake plan that would help with his sodium blood tests.

## 2016-12-24 ENCOUNTER — Telehealth: Payer: Self-pay | Admitting: Dietician

## 2016-12-25 NOTE — Telephone Encounter (Signed)
Brief Nutrition Note: Patient had recent visit to Dr. Loanne Drilling.  His sodium decreased to 131.   He wanted a new plan sent to him regarding his fluid restriction.  Recommendations to continue a 2L fluid restriction without alcohol.  He states that he has followed this plan closely.  I will send him a copy of the plan listed in my September 11, 2016 note.  Patient is to call if he has any questions.  Antonieta Iba, RD, LDN

## 2017-01-02 ENCOUNTER — Telehealth: Payer: Self-pay | Admitting: Endocrinology

## 2017-01-02 NOTE — Telephone Encounter (Signed)
Requested a call back to further discuss.  

## 2017-01-02 NOTE — Telephone Encounter (Signed)
Pt called in to speak with Jinny Blossom, he stated that he is confused about a medication that Dr. Loanne Drilling has him on.

## 2017-01-03 ENCOUNTER — Telehealth: Payer: Self-pay | Admitting: Endocrinology

## 2017-01-03 NOTE — Telephone Encounter (Signed)
Patient wanted to know if you could explain why this is not true for his situation? Thanks!

## 2017-01-03 NOTE — Telephone Encounter (Signed)
I contacted the patient. Patient sated he went to [facility] pharmacy and the head pharmacist advised him he should not be taking the demeclocycline on a daily basis. He advised the patient this a antibiotic and should not be taking this medication on daily basis. Pharmacist stated he should be switched to a 1 gram sodium tablet. Patient stated he was extremely confused on this information.

## 2017-01-03 NOTE — Telephone Encounter (Signed)
The pharmacist was apparently confused by the fact that the demeclocycline was used as an antibiotic many years ago.  However, it has not recently been used for that purpose.

## 2017-01-03 NOTE — Telephone Encounter (Signed)
Pt is asking for a call back he is now not ok with the info you told him yesterday

## 2017-01-03 NOTE — Telephone Encounter (Signed)
Pt has called again with no further information just asking for a call back before lunch time

## 2017-01-03 NOTE — Telephone Encounter (Signed)
Patient notified of message and stated he would like to further discuss this information at his up coming office visit.

## 2017-01-03 NOTE — Telephone Encounter (Signed)
No, this is not so.  Please continue the same medication.

## 2017-01-08 ENCOUNTER — Ambulatory Visit (INDEPENDENT_AMBULATORY_CARE_PROVIDER_SITE_OTHER): Payer: Medicare Other | Admitting: *Deleted

## 2017-01-08 DIAGNOSIS — T63441D Toxic effect of venom of bees, accidental (unintentional), subsequent encounter: Secondary | ICD-10-CM | POA: Diagnosis not present

## 2017-01-10 DIAGNOSIS — E222 Syndrome of inappropriate secretion of antidiuretic hormone: Secondary | ICD-10-CM | POA: Diagnosis not present

## 2017-01-10 DIAGNOSIS — M6281 Muscle weakness (generalized): Secondary | ICD-10-CM | POA: Diagnosis not present

## 2017-01-10 DIAGNOSIS — Z23 Encounter for immunization: Secondary | ICD-10-CM | POA: Diagnosis not present

## 2017-01-10 DIAGNOSIS — N402 Nodular prostate without lower urinary tract symptoms: Secondary | ICD-10-CM | POA: Diagnosis not present

## 2017-01-16 DIAGNOSIS — R351 Nocturia: Secondary | ICD-10-CM | POA: Diagnosis not present

## 2017-01-16 DIAGNOSIS — N401 Enlarged prostate with lower urinary tract symptoms: Secondary | ICD-10-CM | POA: Diagnosis not present

## 2017-01-21 ENCOUNTER — Encounter: Payer: Self-pay | Admitting: Endocrinology

## 2017-01-21 ENCOUNTER — Ambulatory Visit (INDEPENDENT_AMBULATORY_CARE_PROVIDER_SITE_OTHER): Payer: Medicare Other | Admitting: Endocrinology

## 2017-01-21 VITALS — BP 110/68 | HR 83 | Ht 67.0 in | Wt 143.0 lb

## 2017-01-21 DIAGNOSIS — E871 Hypo-osmolality and hyponatremia: Secondary | ICD-10-CM

## 2017-01-21 LAB — POCT URINALYSIS DIPSTICK
Bilirubin, UA: NEGATIVE
Blood, UA: NEGATIVE
Glucose, UA: NEGATIVE
Ketones, UA: NEGATIVE
Leukocytes, UA: NEGATIVE
Nitrite, UA: NEGATIVE
Protein, UA: NEGATIVE
Spec Grav, UA: 1.02 (ref 1.010–1.025)
Urobilinogen, UA: 0.2 E.U./dL
pH, UA: 6 (ref 5.0–8.0)

## 2017-01-21 LAB — BASIC METABOLIC PANEL
BUN: 18 mg/dL (ref 6–23)
CO2: 29 mEq/L (ref 19–32)
Calcium: 9.3 mg/dL (ref 8.4–10.5)
Chloride: 102 mEq/L (ref 96–112)
Creatinine, Ser: 1.23 mg/dL (ref 0.40–1.50)
GFR: 59.13 mL/min — ABNORMAL LOW (ref >60.00)
Glucose, Bld: 87 mg/dL (ref 70–99)
Potassium: 4.9 mEq/L (ref 3.5–5.1)
Sodium: 136 mEq/L (ref 135–145)

## 2017-01-21 NOTE — Patient Instructions (Addendum)
Please continue the same demeclocyline.   Blood and urine tests are requested for you today.  We'll let you know about the results.  You do not need to limit salt intake, so you should eat a "liberal salt" diet.   Please continue to limit how much fluid you drink (maximum 2 quarts per day).   Please come back for a follow-up appointment in 2 months.

## 2017-01-21 NOTE — Progress Notes (Signed)
Subjective:    Patient ID: Adam Cota., male    DOB: [birth year], 83 y.o.   MRN: [id number]  HPI Pt returns for f/u of hyponatremia (dx'ed 2012; no secondary cause was found; VP level was undetectable, and urine osm was 484, so nephrogenic cause is presumed; CT showed no evidence of hydronephrosis or urinary calculi; he was [facility]'ed [facility], [facility]).  pt states he feels well in general, except for heat intolerance.   Past Medical History:  Diagnosis Date  . Anxiety   . Colon polyp    hyperplastic  . Diverticulosis of colon (without mention of hemorrhage)   . Esophageal stricture   . GERD (gastroesophageal reflux disease)   . Hiatal hernia   . OCD (obsessive compulsive disorder)   . Viral hepatitis 08/1983    Past Surgical History:  Procedure Laterality Date  . COLONOSCOPY    . [place]    Social History   Social History  . Marital status: Married    Spouse name: N/A  . Number of children: 2  . Years of education: N/A   Occupational History  . retired    Social History Main Topics  . Smoking status: Never Smoker  . Smokeless tobacco: Never Used  . Alcohol use 3.5 oz/week    7 Standard drinks or equivalent per week     Comment: 1 glass of wine a day  . Drug use: No  . Sexual activity: Not on file   Other Topics Concern  . Not on file   Social History Narrative  . No narrative on file    Current Outpatient Prescriptions on File Prior to Visit  Medication Sig Dispense Refill  . buPROPion (WELLBUTRIN XL) 150 MG 24 hr tablet 2 tablets in the morning    . Cholecalciferol (VITAMIN D) 1000 UNITS capsule Take 1,000 Units by mouth daily.      Marland Kitchen demeclocycline (DECLOMYCIN) 150 MG tablet Take 150 mg by mouth 2 (two) times daily.    Marland Kitchen EPINEPHrine (EPI-PEN) 0.3 mg/0.3 mL DEVI Inject 0.3 mg into the muscle once.    Marland Kitchen LORazepam (ATIVAN) 1 MG tablet Take 1-2 tablets (1-2 mg total) by mouth 2 (two) times daily. Take 1 tablet at noon  and 2 tablets at bedtime 30 tablet 0  . Multiple Vitamins-Minerals (PRESERVISION AREDS) CAPS Take 1 capsule by mouth 2 (two) times daily.    . polyethylene glycol (MIRALAX / GLYCOLAX) packet Take 17 g by mouth daily.      . sodium chloride (OCEAN) 0.65 % SOLN nasal spray Place 1 spray into both nostrils 3 (three) times daily as needed for congestion.    . Wasp Venom (VENOMIL WASP VENOM) 120 MCG KIT      No current facility-administered medications on file prior to visit.     Allergies  Allergen Reactions  . Bee Venom Anaphylaxis    Other reaction(s): Other (See Comments) Loss of consciousness  . Nsaids     Other reaction(s): intol  . Other Other (See Comments)    Family History  Problem Relation Age of Onset  . Diverticulosis Mother   . Melanoma Mother   . Lung cancer Father   . Colon cancer Other        1st cousion.  . Allergic rhinitis Neg Hx   . Angioedema Neg Hx   . Asthma Neg Hx   . Eczema Neg Hx   . Immunodeficiency Neg Hx   .  Urticaria Neg Hx     BP 110/68   Pulse 83   Ht 5' 7"  (1.702 m)   Wt 143 lb (64.9 kg)   SpO2 97%   BMI 22.40 kg/m    Review of Systems Denies n/v/headache    Objective:   Physical Exam Vital signs: see vs page.  Gen: elderly, frail, no distress.   Gait: normal and steady.   Ext: no edema.   Lab Results  Component Value Date   CREATININE 1.23 01/21/2017   BUN 18 01/21/2017   NA 136 01/21/2017   K 4.9 01/21/2017   CL 102 01/21/2017   CO2 29 01/21/2017      Assessment & Plan:  Hyponatremia: due for recheck Please continue the same medication.  Patient Instructions  Please continue the same demeclocyline.   Blood and urine tests are requested for you today.  We'll let you know about the results.  You do not need to limit salt intake, so you should eat a "liberal salt" diet.   Please continue to limit how much fluid you drink (maximum 2 quarts per day).   Please come back for a follow-up appointment in 2 months.

## 2017-01-24 IMAGING — CR DG CHEST 2V
2 series · 2 of 2 positions shown · non-contrast
Comparison: None.

CLINICAL DATA: Acute onset of shortness of breath and cough.
Generalized fatigue. Initial encounter.

EXAM:
CHEST  2 VIEW

[w chest lat]
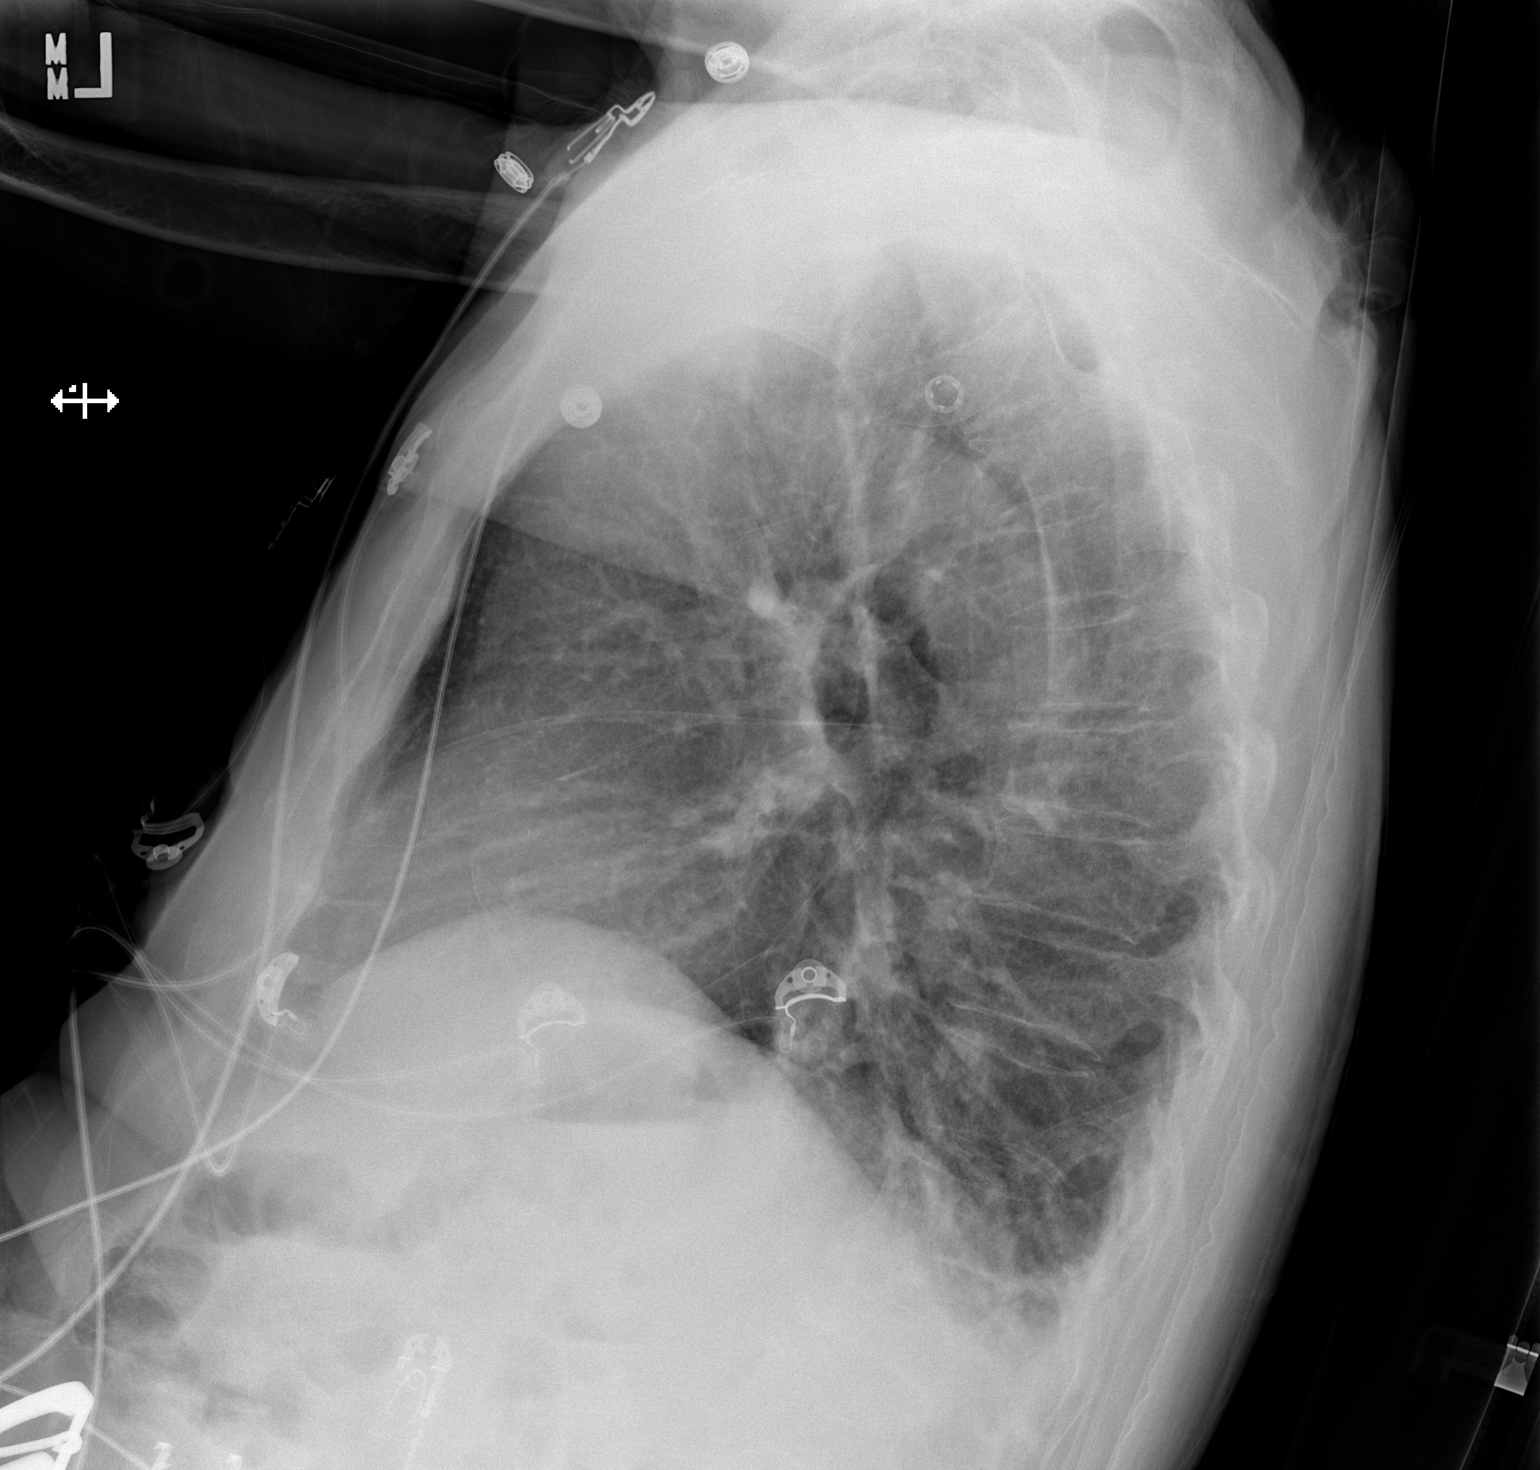

[x chest ap]
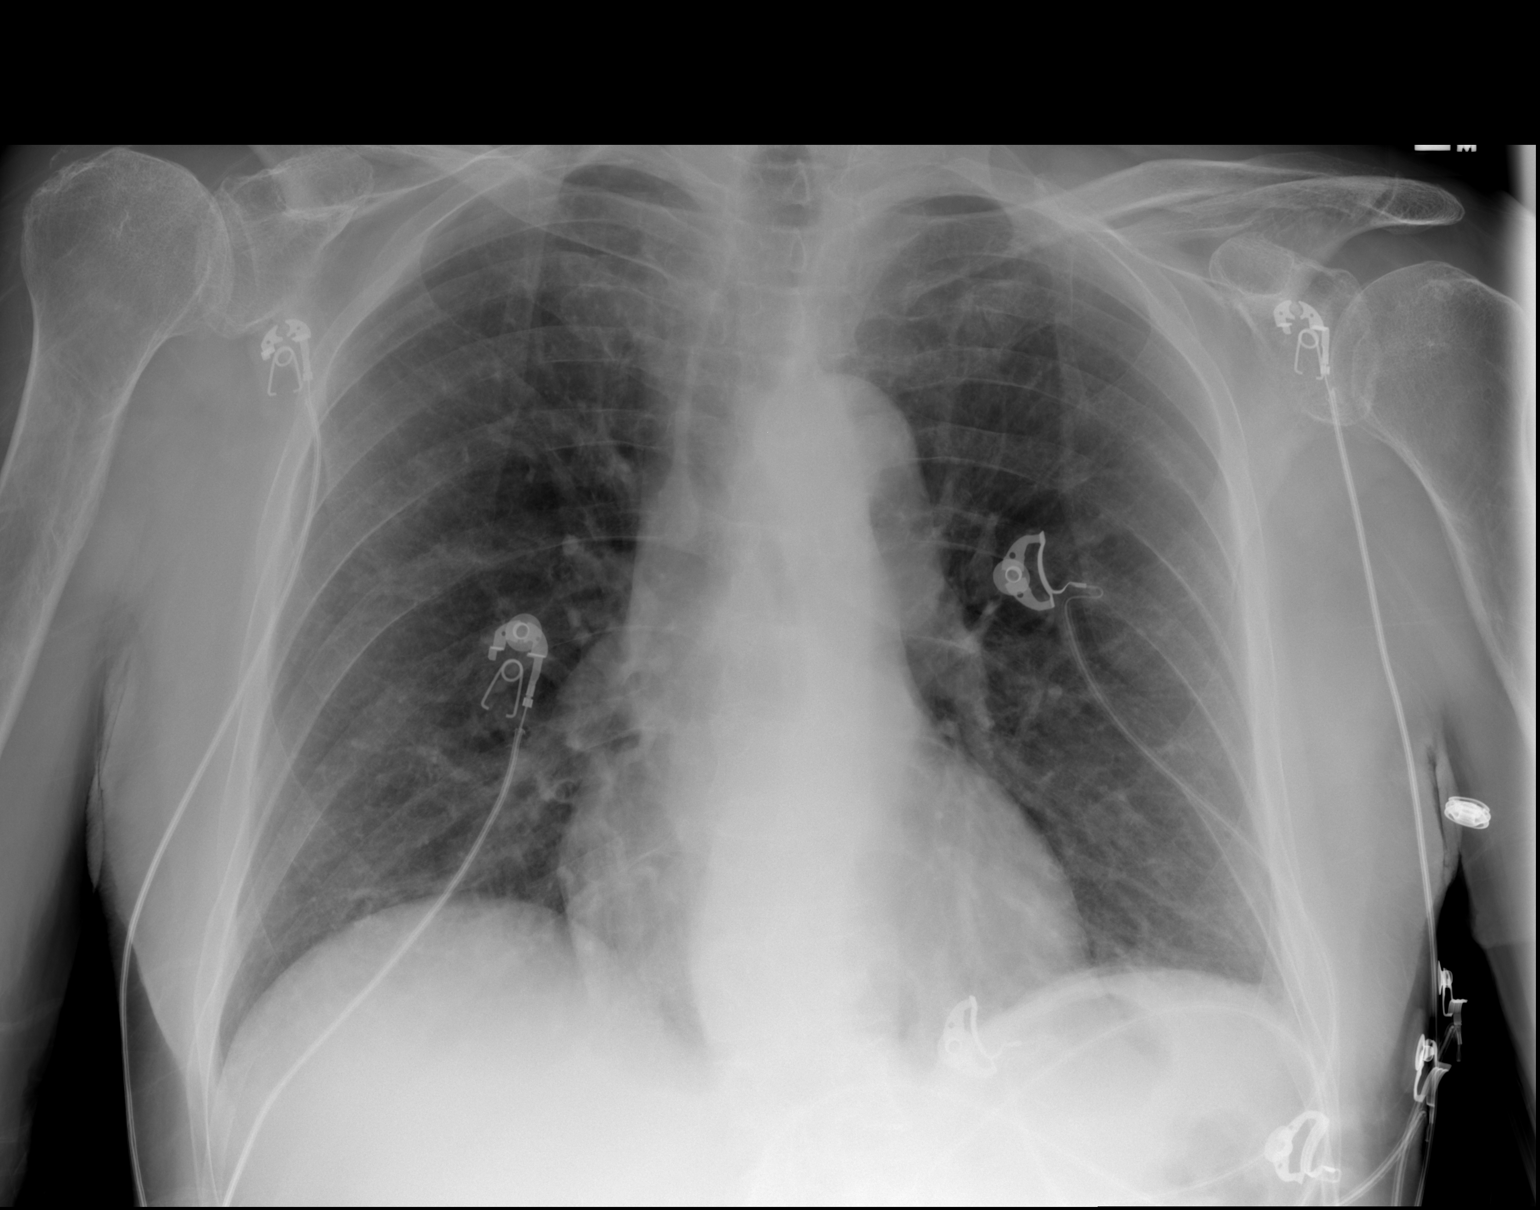

[2 of 2 positions shown; findings below may reference images not displayed]

FINDINGS: The lungs are well-aerated. Minimal bilateral atelectasis is noted.
There is no evidence of pleural effusion or pneumothorax.

The heart is normal in size; the mediastinal contour is within
normal limits. No acute osseous abnormalities are seen.
IMPRESSION: Minimal bilateral atelectasis noted.  Lungs otherwise clear.

## 2017-01-24 IMAGING — CT CT HEAD W/O CM
2 of 4 series · 16 of 30 positions shown, 19 images · non-contrast
Comparison: None.

CLINICAL DATA: Extreme fatigue and weakness for several days.

EXAM:
CT HEAD WITHOUT CONTRAST
TECHNIQUE: Contiguous axial images were obtained from the base of the skull
through the vertex without intravenous contrast.

[Series 2: head w/o · axial · non-contrast · 0.45mm/px · z∈[-612,-482]mm · 9 of 34 slices shown, 12 images]
[im 4/34  brain]
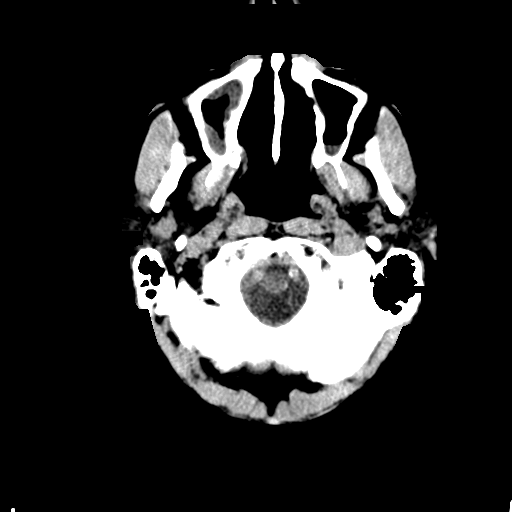
[im 4/34  bone]
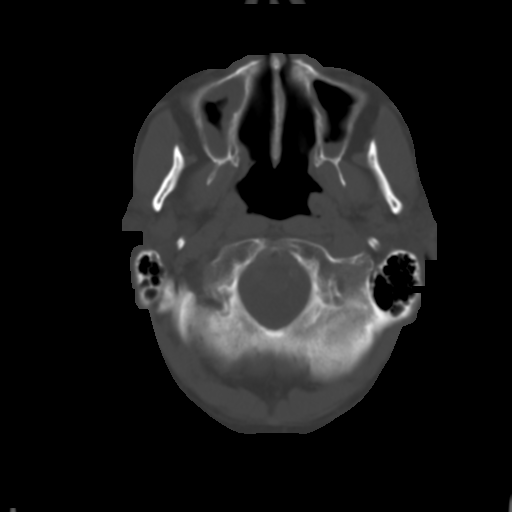
[im 7/34  brain]
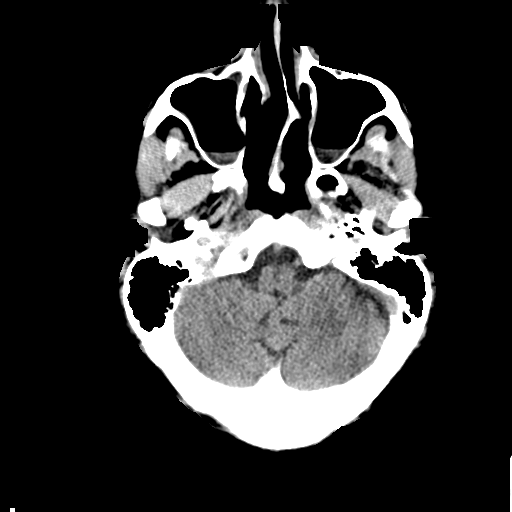
[im 10/34  brain]
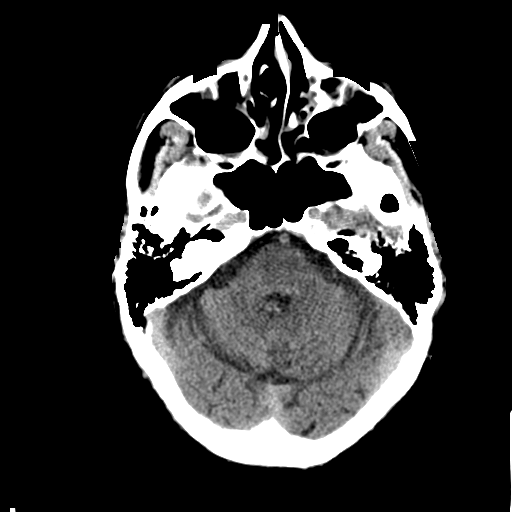
[im 14/34  brain]
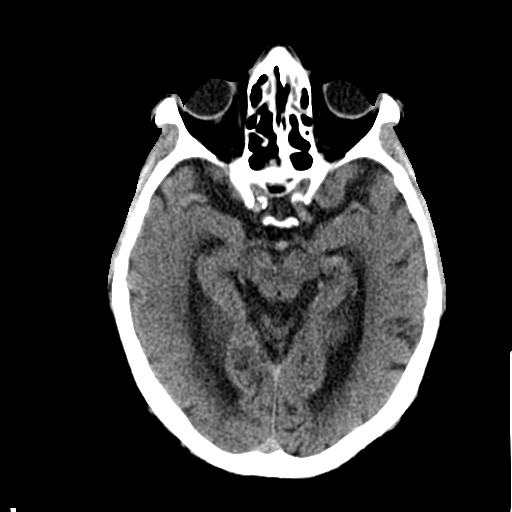
[im 17/34  brain]
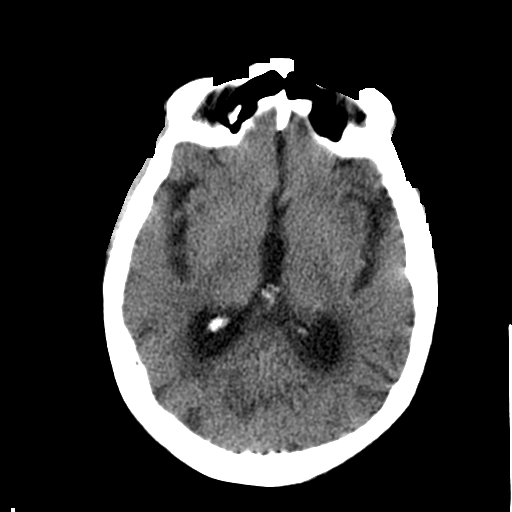
[im 17/34  bone]
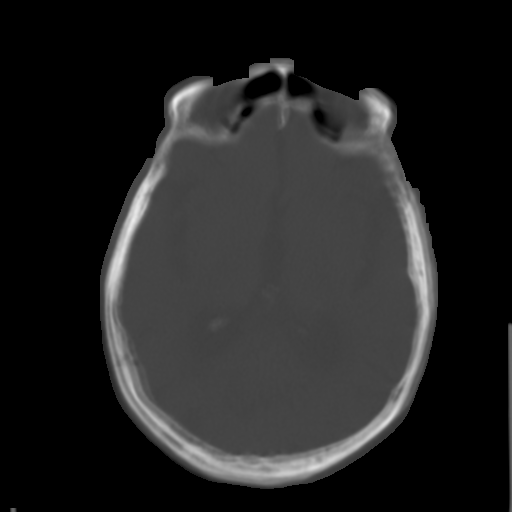
[im 20/34  brain]
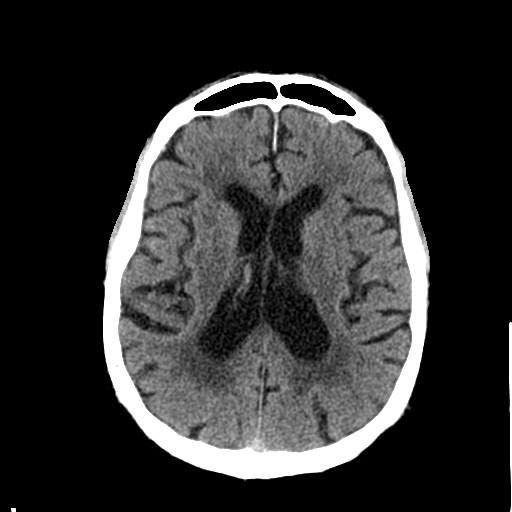
[im 24/34  brain]
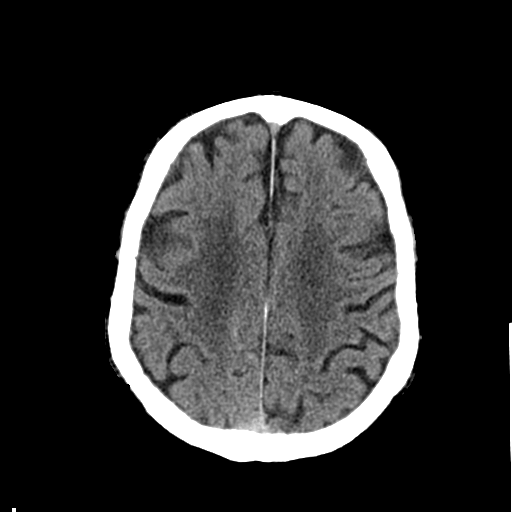
[im 27/34  brain]
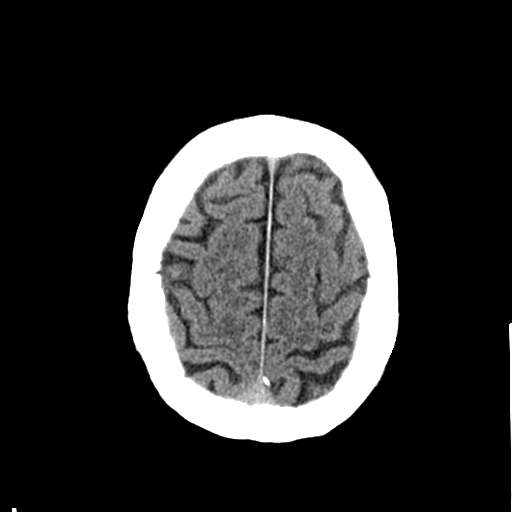
[im 30/34  brain]
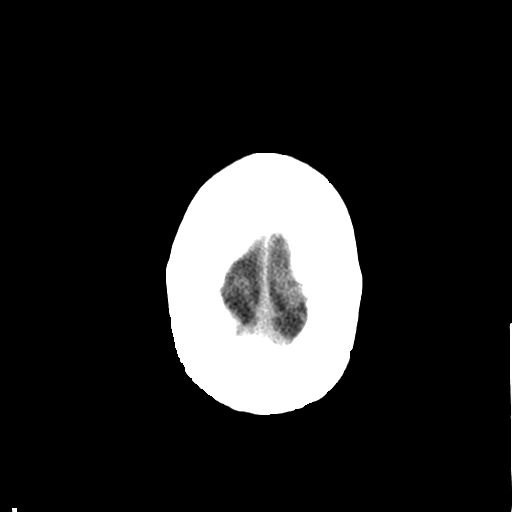
[im 30/34  bone]
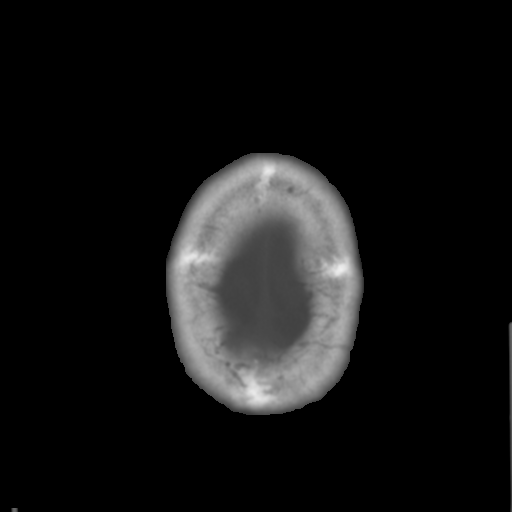

[Series 3: bone windows · axial · 0.45mm/px · z∈[-612,-497]mm · 7 of 34 slices shown]
[im 4/34  bone]
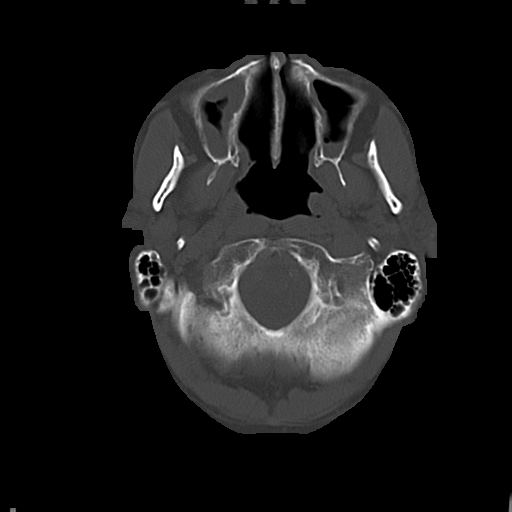
[im 7/34  bone]
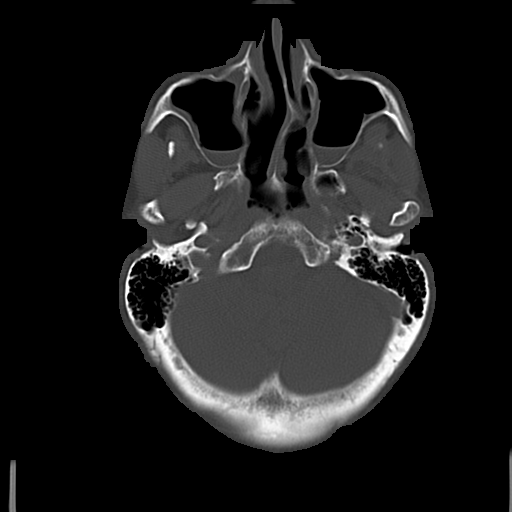
[im 10/34  bone]
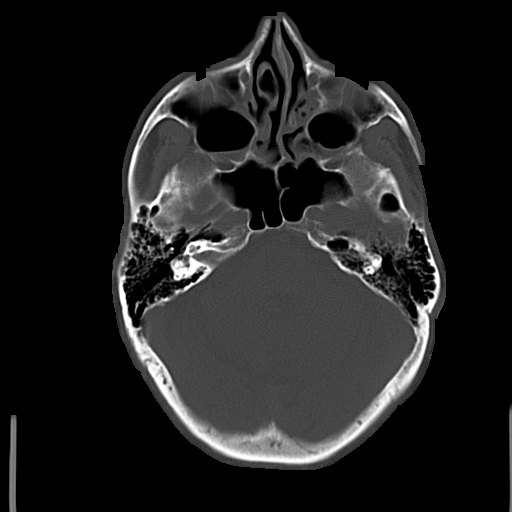
[im 14/34  bone]
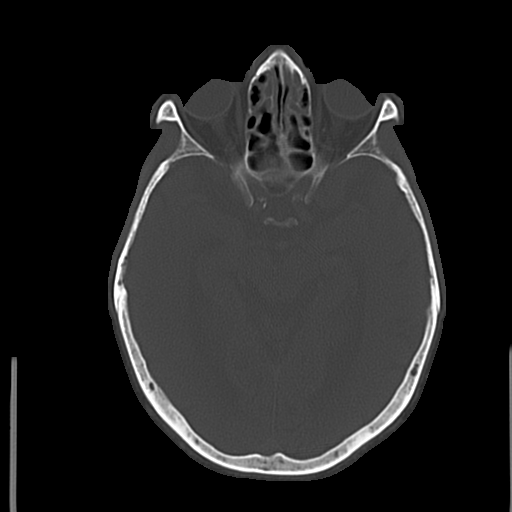
[im 20/34  bone]
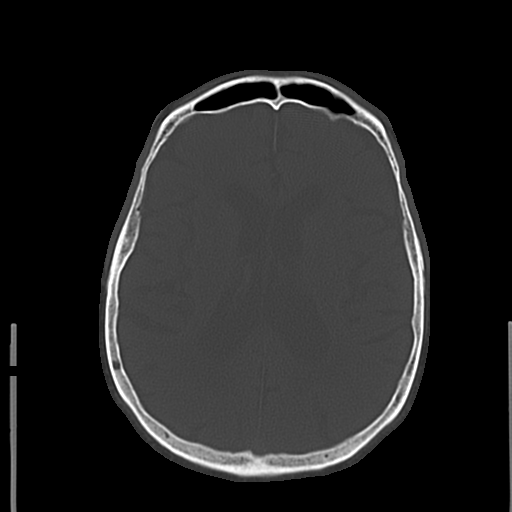
[im 24/34  bone]
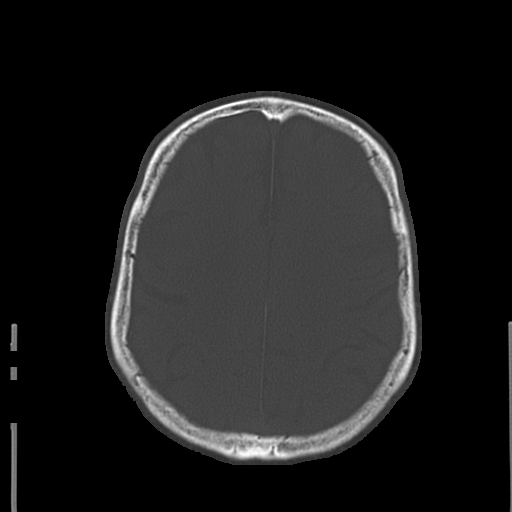
[im 27/34  bone]
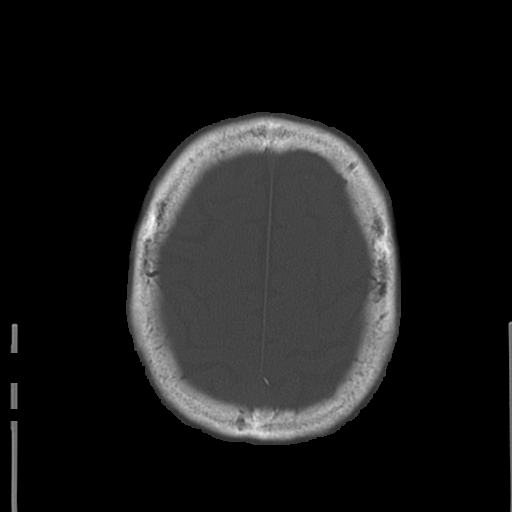

[16 of 30 positions shown; findings below may reference images not displayed]

FINDINGS: There is no evidence of intracranial hemorrhage, brain edema, or
other signs of acute infarction. There is no evidence of
intracranial mass lesion or mass effect. No abnormal extraaxial
fluid collections are identified.

Moderate diffuse cerebral atrophy and chronic small vessel disease
are seen. No evidence of obstructive hydrocephalus. No skull
abnormality identified. Air-fluid levels are seen in both maxillary
sinuses as well as mucosal thickening involving the ethmoid sinuses
bilaterally.
IMPRESSION: No acute intracranial abnormality.

Moderate cerebral atrophy and chronic small vessel disease.

Bilateral ethmoid and maxillary sinusitis, with bilateral maxillary
air-fluid levels suggesting acute sinusitis.

## 2017-01-28 ENCOUNTER — Encounter: Payer: Self-pay | Admitting: Neurology

## 2017-01-28 ENCOUNTER — Ambulatory Visit (INDEPENDENT_AMBULATORY_CARE_PROVIDER_SITE_OTHER): Payer: Medicare Other | Admitting: Neurology

## 2017-01-28 VITALS — BP 117/77 | HR 73 | Ht 67.0 in | Wt 143.5 lb

## 2017-01-28 DIAGNOSIS — E538 Deficiency of other specified B group vitamins: Secondary | ICD-10-CM | POA: Diagnosis not present

## 2017-01-28 DIAGNOSIS — R799 Abnormal finding of blood chemistry, unspecified: Secondary | ICD-10-CM | POA: Diagnosis not present

## 2017-01-28 DIAGNOSIS — E871 Hypo-osmolality and hyponatremia: Secondary | ICD-10-CM

## 2017-01-28 DIAGNOSIS — R531 Weakness: Secondary | ICD-10-CM

## 2017-01-28 DIAGNOSIS — M545 Low back pain, unspecified: Secondary | ICD-10-CM

## 2017-01-28 DIAGNOSIS — E559 Vitamin D deficiency, unspecified: Secondary | ICD-10-CM | POA: Diagnosis not present

## 2017-01-28 NOTE — Progress Notes (Signed)
**Note De-Identified Swanson Obfuscation** PATIENT: Adam Swanson. DOB: 07/29/1928  Chief Complaint  Patient presents with  . Extremity Weakness    He is here with his son, Adam Swanson.  He lives in an assisted living community.  Reports feeling weakness in his legs and heaviness in his feet.  He has difficulty when trying to lift his legs and standing from a seated position.  The symptoms have caused a slow and unsteady gait.  He has had multiple falls. Denies pain, numbness or tingling.  Problems have worsened over the last nine months.  Marland Kitchen PCP    Adam Fess, MD.  He has also seen Dr. [name].     Adam Run Markiewicz Jr. is a pleasant 83 years old right-handed male, accompanied by his Adam Swanson, seen in refer by  his primary care physician Adam Swanson, for evaluation of gait abnormality, initial evaluation was on Jan 30 2017.  I reviewed and summarized the referring note, he had a history of lumbar spondylosis, had a compression fracture at T12-L1, history of diverticulitis, chronic anxiety,   He suffered pneumonia at the end of 2016, was admitted to the hospital in January 2017, he was treated with vancomycin, azithromycin, laboratory evaluation showed mild hyponatremia, echocardiogram showed ejection fraction 60-65%, no regional wall motion abnormality,  Shortly after discharge, he presented to the emergency room on November 06 2015 complains of low back pain, especially with movement.  Since then, he noticed gradual onset worsening gait abnormality, he now denies significant low back pain, no bilateral upper and lower extremity paresthesia, but he described difficulty maneuver his bilateral lower extremity, such as stepping on his brakes, difficulty getting up from seated position, he has to push on chair arm to get up from low seated position, He denies bowel and bladder incontinence, he denies bilateral upper extremity paresthesia or weakness,  While he ambulate, he felt like 5 pounds weight was on each feet, he has  stiff gait, shuffling  Laboratory evaluations normal CMP, with creatinine of 1.23,  I personally reviewed CT lumbar in February 2017 showed chronic T12-L1 fracture, moderate spinal stenosis at L4, he was seen by neurosurgeon Adam Swanson, who recommended MRI of lumbar,  I reviewed MRI of lumbar spine without contrast at [facility] neurosurgical on July 19 2016, compression deformity of T12-L1, unchanged compared to CAT scan in February 2017, multilevel degenerative disc disease and facet arthrosis, without spinal canal stenosis, mild foraminal narrowing at multiple levels, including bilateral L1-2, L3-4, left L4-5, nonspecific he trig genius bone marrow signal abnormality,  REVIEW OF SYSTEMS: Full 14 system review of systems performed and notable only for as above  ALLERGIES: Allergies  Allergen Reactions  . Bee Venom Anaphylaxis    Other reaction(s): Other (See Comments) Loss of consciousness  . Nsaids     Other reaction(s): intol  . Other Other (See Comments)    HOME MEDICATIONS: Current Outpatient Prescriptions  Medication Sig Dispense Refill  . buPROPion (WELLBUTRIN XL) 150 MG 24 hr tablet 2 tablets in the morning    . Cholecalciferol (VITAMIN D) 1000 UNITS capsule Take 1,000 Units by mouth daily.      Marland Kitchen demeclocycline (DECLOMYCIN) 150 MG tablet Take 150 mg by mouth 2 (two) times daily.    Marland Kitchen EPINEPHrine (EPI-PEN) 0.3 mg/0.3 mL DEVI Inject 0.3 mg into the muscle once.    Marland Kitchen LORazepam (ATIVAN) 1 MG tablet Take 1-2 tablets (1-2 mg total) by mouth 2 (two) times daily. Take 1 tablet at noon and  2 tablets at bedtime 30 tablet 0  . Multiple Vitamins-Minerals (PRESERVISION AREDS) CAPS Take 1 capsule by mouth 2 (two) times daily.    . polyethylene glycol (MIRALAX / GLYCOLAX) packet Take 17 g by mouth daily.      . sodium chloride (OCEAN) 0.65 % SOLN nasal spray Place 1 spray into both nostrils 3 (three) times daily as needed for congestion.     No current facility-administered medications  for this visit.     PAST MEDICAL HISTORY: Past Medical History:  Diagnosis Date  . Anxiety   . Colon polyp    hyperplastic  . Diverticulosis of colon (without mention of hemorrhage)   . Esophageal stricture   . GERD (gastroesophageal reflux disease)   . Hiatal hernia   . Lower extremity weakness   . OCD (obsessive compulsive disorder)   . Viral hepatitis 08/1983    PAST SURGICAL HISTORY: Past Surgical History:  Procedure Laterality Date  . COLONOSCOPY    . HEMORRHOID SURGERY     1965    FAMILY HISTORY: Family History  Problem Relation Age of Onset  . Diverticulosis Mother   . Melanoma Mother   . Lung cancer Father   . Colon cancer Other        1st cousion.  . Allergic rhinitis Neg Hx   . Angioedema Neg Hx   . Asthma Neg Hx   . Eczema Neg Hx   . Immunodeficiency Neg Hx   . Urticaria Neg Hx     SOCIAL HISTORY:  Social History   Social History  . Marital status: Married    Spouse name: N/A  . Number of children: 2  . Years of education: HS   Occupational History  . retired    Social History Main Topics  . Smoking status: Never Smoker  . Smokeless tobacco: Never Used  . Alcohol use 3.5 oz/week    7 Standard drinks or equivalent per week     Comment: occasional  . Drug use: No  . Sexual activity: Not on file   Other Topics Concern  . Not on file   Social History Narrative   Lives at home alone.   Right-handed.   No caffeine use.     PHYSICAL EXAM   Vitals:   01/28/17 1511  BP: 117/77  Pulse: 73  Weight: 143 lb 8 oz (65.1 kg)  Height: 5\' 7"  (1.702 m)    Not recorded      Body mass index is 22.48 kg/m.  PHYSICAL EXAMNIATION:  Gen: NAD, conversant, well nourised, obese, well groomed                     Cardiovascular: Regular rate rhythm, no peripheral edema, warm, nontender. Eyes: Conjunctivae clear without exudates or hemorrhage Neck: Supple, no carotid bruits. Pulmonary: Clear to auscultation bilaterally   NEUROLOGICAL  EXAM:  MENTAL STATUS: Speech:    Speech is normal; fluent and spontaneous with normal comprehension.  Cognition:     Orientation to time, place and person     Normal recent and remote memory     Normal Attention span and concentration     Normal Language, naming, repeating,spontaneous speech     Fund of knowledge   CRANIAL NERVES: CN II: Visual fields are full to confrontation. Fundoscopic exam is normal with sharp discs and no vascular changes. Pupils are round equal and briskly reactive to light. CN III, IV, VI: extraocular movement are normal. No ptosis. CN V: Facial sensation  is intact to pinprick in all 3 divisions bilaterally. Corneal responses are intact.  CN VII: Face is symmetric with normal eye closure and smile. CN VIII: Hearing is normal to rubbing fingers CN IX, X: Palate elevates symmetrically. Phonation is normal. CN XI: Head turning and shoulder shrug are intact CN XII: Tongue is midline with normal movements and no atrophy.  MOTOR: There is no pronator drift of out-stretched arms. Muscle bulk and tone are normal. Muscle strength is normal.  REFLEXES: Reflexes are 1 and symmetric at the biceps, triceps,  2knees, and  absent at ankles. Plantar responses are flexor.  SENSORY: Intact to light touch, pinprick, positional sensation and vibratory sensation are intact in fingers and toes.  COORDINATION: Rapid alternating movements and fine finger movements are intact. There is no dysmetria on finger-to-nose and heel-knee-shin.    GAIT/STANCE: He needs push up to get up from seated position, wide based, stiff, cautious, Romberg is absent.   DIAGNOSTIC DATA (LABS, IMAGING, TESTING) - I reviewed patient records, labs, notes, testing and imaging myself where available.   ASSESSMENT AND PLAN  Rolf Fells. is a 83 y.o. male   subacute worsening gait abnormality since early 2017  he has hyperreflexia at bilateral patellar  Potential localization to cervical  versus thoracic spine, or even normal hydrocephalus also need to rule out myopathy  Proceed with MRI of cervical, thoracic spine, MRI of brain  Laboratory evaluations  EMG nerve conduction study   Marcial Pacas, M.D. Ph.D.  [facility] [address], [place], [place] [address] Ph: [phone] Fax: [phone]  CC: Dineen Kid, MD

## 2017-01-30 ENCOUNTER — Ambulatory Visit (INDEPENDENT_AMBULATORY_CARE_PROVIDER_SITE_OTHER): Payer: Self-pay | Admitting: Neurology

## 2017-01-30 ENCOUNTER — Ambulatory Visit (INDEPENDENT_AMBULATORY_CARE_PROVIDER_SITE_OTHER): Payer: Medicare Other | Admitting: Neurology

## 2017-01-30 DIAGNOSIS — E871 Hypo-osmolality and hyponatremia: Secondary | ICD-10-CM

## 2017-01-30 DIAGNOSIS — M545 Low back pain, unspecified: Secondary | ICD-10-CM

## 2017-01-30 DIAGNOSIS — R531 Weakness: Secondary | ICD-10-CM

## 2017-01-30 DIAGNOSIS — R269 Unspecified abnormalities of gait and mobility: Secondary | ICD-10-CM

## 2017-01-30 DIAGNOSIS — Z0289 Encounter for other administrative examinations: Secondary | ICD-10-CM

## 2017-01-30 NOTE — Procedures (Signed)
Full Name: Adam Swanson Gender: Male MRN #: [id number] Date of Birth: 2028-07-10    Visit Date: 01/30/17 08:15 Age: 83 Years 3 Months Old Examining Physician: Marcial Pacas, MD  Referring Physician: Dr. Krista Blue History: 83 years old male presented with few months history of gait abnormality, he denies significant sensory loss.  Summary of the test: Nerve conduction study: Bilateral sural, superficial peroneal sensory responses were absent. Right ulnar, radial sensory responses were normal. Bilateral peroneal to EDB and tibial motor responses were normal.  Electromyography: Selected needle examination of bilateral lower extremity muscles, right upper extremity muscles, bilateral lumbosacral paraspinal muscles and right cervical paraspinal muscles were performed. There was no significant abnormality found.  Conclusion:  This is a mild abnormal study. 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 None of the findings would explain his complains of gait abnormality.    ------------------------------- Marcial Pacas, M.D.  [facility] [place], [place] [address] Tel: [phone] Fax: [phone]        [facility]    Nerve / Sites Muscle Latency Ref. Amplitude Ref. Rel Amp Segments Distance Velocity Ref. Area    ms ms mV mV %  cm m/s m/s mVms  R Peroneal - EDB     Ankle EDB 5.5 ?6.5 5.1 ?2.0 100 Ankle - EDB 9   14.7     Fib head EDB 12.3  4.3  84.9 Fib head - Ankle 28 41 ?44 13.3     Pop fossa EDB 14.5  4.1  95.2 Pop fossa - Fib head 10 46 ?44 12.8         Pop fossa - Ankle      L Peroneal - EDB     Ankle EDB 5.9 ?6.5 3.3 ?2.0 100 Ankle - EDB 9   10.3     Fib head EDB 12.6  3.6  110 Fib  head - Ankle 29 44 ?44 10.8     Pop fossa EDB 14.9  3.5  98.1 Pop fossa - Fib head 10 42 ?44 10.5         Pop fossa - Ankle      R Tibial - AH     Ankle AH 4.5 ?5.8 7.2 ?4.0 100 Ankle - AH 9   16.7     Pop fossa AH 14.5  5.5  76.3 Pop fossa - Ankle 38 38 ?41 14.6  L Tibial - AH     Ankle AH 5.9 ?5.8 6.0 ?4.0 100 Ankle - AH 9   18.2     Pop fossa AH 15.4  5.7  95.3 Pop fossa - Ankle 38 40 ?41 16.1             SNC    Nerve / Sites Rec. Site Peak Lat Ref.  Amp Ref. Segments Distance    ms ms V V  cm  R Radial - Anatomical snuff box (Forearm)     Forearm Wrist 2.7 ?2.9 6 ?15 Forearm - Wrist 10  R Sural - Ankle (Calf)     Calf Ankle NR ?4.4 NR ?6 Calf - Ankle 14  L Sural - Ankle (Calf)     Calf Ankle NR ?4.4 NR ?6 Calf - Ankle 14  R Superficial peroneal - Ankle     Lat leg Ankle NR ?4.4 NR ?6 Lat leg - Ankle 14  L Superficial peroneal - Ankle     Lat leg Ankle NR ?4.4 NR ?6 Lat leg - Ankle 14  R Ulnar - Orthodromic, (Dig V, Mid palm)     Dig V Wrist 3.1 ?3.1 3 ?5 Dig V - Wrist 63                 F  Wave    Nerve F Lat Ref.   ms ms  R Tibial - AH 58.4 ?56.0  L Tibial - AH 58.0 ?56.0         H Reflex    Nerve H Lat Lat Hmax   ms ms   Left Right Ref. Left Right Ref.  Tibial - [place] 37.4 36.9 ?35.0 41.1 20.0 ?35.0         EMG full       EMG Summary Table    Spontaneous MUAP Recruitment  Muscle IA Fib PSW Fasc Other Amp Dur. Poly Pattern  R. Tibialis anterior Normal None None None _______ Normal Normal Normal Reduced  R. Gastrocnemius (Medial head) Normal None None None _______ Normal Normal Normal Reduced  R. Vastus lateralis Normal None None None _______ Normal Normal Normal Normal  L. Tibialis anterior Normal None None None _______ Normal Normal Normal Reduced  L. Gastrocnemius (Medial head) Normal None None None _______ Normal Normal Normal Reduced  L. Vastus lateralis Normal None None None _______ Normal Normal Normal Normal  R. Iliopsoas Normal None None None  _______ Normal Normal Normal Normal  L. Iliopsoas Normal None None None _______ Normal Normal Normal Normal  R. Lumbar paraspinals (mid) Normal None None None _______ Normal Normal Normal Normal  R. Lumbar paraspinals (low) Normal None None None _______ Normal Normal Normal Normal  L. Lumbar paraspinals (mid) Normal None None None _______ Normal Normal Normal Normal  L. Lumbar paraspinals (low) Normal None None None _______ Normal Normal Normal Normal  R. First dorsal interosseous Normal None None None _______ Normal Normal Normal Normal  R. Pronator teres Normal None None None _______ Normal Normal Normal Normal  R. Deltoid Normal None None None _______ Normal Normal Normal Normal  R. Biceps brachii Normal None None None _______ Normal Normal Normal Normal  R. Cervical paraspinals Normal None None None _______ Normal Normal Normal Normal

## 2017-01-30 NOTE — Progress Notes (Signed)
See procedure note.

## 2017-02-05 LAB — TSH: TSH: 1.68 u[IU]/mL (ref 0.450–4.500)

## 2017-02-05 LAB — CK: Total CK: 70 U/L (ref 24–204)

## 2017-02-05 LAB — ACETYLCHOLINE RECEPTOR AB, ALL
AChR Binding Ab, Serum: <0.03 nmol/L (ref 0.00–0.24)
Acetylchol Block Ab: 7 % (ref 0–25)
Acetylcholine Modulat Ab: <12 % (ref 0–20)

## 2017-02-05 LAB — RPR: RPR Ser Ql: NONREACTIVE

## 2017-02-05 LAB — FOLATE: Folate: 15.8 ng/mL (ref >3.0)

## 2017-02-05 LAB — ANA W/REFLEX: Anti Nuclear Antibody(ANA): NEGATIVE

## 2017-02-05 LAB — HEPATITIS PANEL, ACUTE
Hep A IgM: NEGATIVE
Hep B C IgM: NEGATIVE
Hep C Virus Ab: <0.1 s/co ratio (ref 0.0–0.9)
Hepatitis B Surface Ag: NEGATIVE

## 2017-02-05 LAB — VITAMIN B12: Vitamin B-12: 488 pg/mL (ref 232–1245)

## 2017-02-05 LAB — COPPER, SERUM: Copper: 92 ug/dL (ref 72–166)

## 2017-02-05 LAB — C-REACTIVE PROTEIN: CRP: 0.5 mg/L (ref 0.0–4.9)

## 2017-02-08 ENCOUNTER — Other Ambulatory Visit: Payer: BLUE CROSS/BLUE SHIELD

## 2017-02-10 ENCOUNTER — Ambulatory Visit
Admission: RE | Admit: 2017-02-10 | Discharge: 2017-02-10 | Disposition: A | Discharge status: Home / Self Care | Payer: Medicare Other | Source: Ambulatory Visit | Attending: Neurology | Admitting: Neurology

## 2017-02-10 DIAGNOSIS — R531 Weakness: Secondary | ICD-10-CM

## 2017-02-10 DIAGNOSIS — E871 Hypo-osmolality and hyponatremia: Secondary | ICD-10-CM

## 2017-02-10 DIAGNOSIS — M545 Low back pain, unspecified: Secondary | ICD-10-CM

## 2017-02-10 DIAGNOSIS — M4802 Spinal stenosis, cervical region: Secondary | ICD-10-CM | POA: Diagnosis not present

## 2017-02-11 DIAGNOSIS — F339 Major depressive disorder, recurrent, unspecified: Secondary | ICD-10-CM | POA: Diagnosis not present

## 2017-02-12 ENCOUNTER — Telehealth: Payer: Self-pay | Admitting: Neurology

## 2017-02-12 DIAGNOSIS — R269 Unspecified abnormalities of gait and mobility: Secondary | ICD-10-CM

## 2017-02-12 NOTE — Telephone Encounter (Signed)
Unable to reach patient.  Left message for his son, [name] (on HIPAA) to return my call.

## 2017-02-12 NOTE — Telephone Encounter (Signed)
Please call patient, MRI of the brain showed significant generalized atrophy, supratentorium small vessel disease, there was no acute findings,  MRI of cervical spine showed multilevel degenerative disc disease, but there was no evidence of spinal cord compression.  MRI of the thoracic spine also showed evidence of chronic compression fracture of T12, but there was no evidence of thoracic spinal cord compression  I have referred him to physical therapy, if he wanted, we may move up his appointment to reviewed the film together,

## 2017-02-12 NOTE — Telephone Encounter (Signed)
Spoke to patient - he is aware of results and will keep his appt on 02/21/17 for further discussion.  He is agreeable to PT and would like this arranged at [facility] in [place] where he resides (contact number [phone]).

## 2017-02-14 NOTE — Telephone Encounter (Signed)
Order has been sent

## 2017-02-21 ENCOUNTER — Encounter: Payer: Self-pay | Admitting: Neurology

## 2017-02-21 ENCOUNTER — Ambulatory Visit (INDEPENDENT_AMBULATORY_CARE_PROVIDER_SITE_OTHER): Payer: Medicare Other | Admitting: Neurology

## 2017-02-21 VITALS — BP 115/82 | HR 73 | Ht 67.0 in | Wt 143.5 lb

## 2017-02-21 DIAGNOSIS — E871 Hypo-osmolality and hyponatremia: Secondary | ICD-10-CM

## 2017-02-21 DIAGNOSIS — R269 Unspecified abnormalities of gait and mobility: Secondary | ICD-10-CM

## 2017-02-21 DIAGNOSIS — E559 Vitamin D deficiency, unspecified: Secondary | ICD-10-CM | POA: Diagnosis not present

## 2017-02-21 DIAGNOSIS — R531 Weakness: Secondary | ICD-10-CM

## 2017-02-21 NOTE — Progress Notes (Signed)
PATIENT: Adam Swanson. DOB: 14-Dec-1927  Chief Complaint  Patient presents with  . Rm New    Son, Adam Swanson  . Follow-up    01/30/17 NCV/EMG and 02/11/17 MRIs  . Gait Problem    C/o ongoing weakness in lower extremities. Reports that feet feel heavy when walking.     HISTORICAL  Adam Swanson. is a pleasant 83 years old right-handed male, accompanied by his son Adam Swanson, seen in refer by  his primary care physician Dr. Dineen Kid, for evaluation of gait abnormality, initial evaluation was on Jan 30 2017.  I reviewed and summarized the referring note, he had a history of lumbar spondylosis, had a compression fracture at T12-L1, history of diverticulitis, chronic anxiety,   He suffered pneumonia at the end of 2016, was admitted to the hospital in January 2017, he was treated with vancomycin, azithromycin, laboratory evaluation showed mild hyponatremia, echocardiogram showed ejection fraction 60-65%, no regional wall motion abnormality,  Shortly after discharge, he presented to the emergency room on November 06 2015 complains of low back pain, especially with movement.  Since then, he noticed gradual onset worsening gait abnormality, he now denies significant low back pain, no bilateral upper and lower extremity paresthesia, but he described difficulty maneuver his bilateral lower extremity, such as stepping on his brakes, difficulty getting up from seated position, he has to push on chair arm to get up from low seated position, He denies bowel and bladder incontinence, he denies bilateral upper extremity paresthesia or weakness,  While he ambulate, he felt like 5 pounds weight was on each feet, he has stiff gait, shuffling  Laboratory evaluations normal CMP, with creatinine of 1.23,  I personally reviewed CT lumbar in February 2017 showed chronic T12-L1 fracture, moderate spinal stenosis at L4, he was seen by neurosurgeon Dr. 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 MRI of the brain showed mild supratentorium small vessel disease, periventricular white matter disease more on the right supratentorium region  Extensive laboratory evaluation in May 2018:  Normal hepatitis panel, negative acetylcholine receptor antibody, CPK, TSH, folic acid, RPR, W09, C-reactive protein, copper, ANA, and BMP,  On examination, he was noted to have mild left arm and left lower extremity weakness, mild unsteady gait  REVIEW OF SYSTEMS: Full 14 system review of systems performed and notable only for as above  ALLERGIES: Allergies  Allergen Reactions  . Bee Venom Anaphylaxis    Other reaction(s): Other (See Comments) Loss of consciousness  . Nsaids     Other reaction(s): intol  . Other Other (See Comments)    HOME MEDICATIONS: Current Outpatient Prescriptions  Medication Sig Dispense Refill  . buPROPion (WELLBUTRIN XL) 150 MG 24 hr tablet 2 tablets in the  morning    . Cholecalciferol (VITAMIN D) 1000 UNITS capsule Take 1,000 Units by mouth daily.      Marland Kitchen demeclocycline (DECLOMYCIN) 150 MG tablet Take 150 mg by mouth  2 (two) times daily.    Marland Kitchen EPINEPHrine (EPI-PEN) 0.3 mg/0.3 mL DEVI Inject 0.3 mg into the muscle once.    Marland Kitchen LORazepam (ATIVAN) 1 MG tablet Take 1-2 tablets (1-2 mg total) by mouth 2 (two) times daily. Take 1 tablet at noon and 2 tablets at bedtime 30 tablet 0  . Multiple Vitamins-Minerals (PRESERVISION AREDS) CAPS Take 1 capsule by mouth 2 (two) times daily.    . polyethylene glycol (MIRALAX / GLYCOLAX) packet Take 17 g by mouth daily.      . sodium chloride (OCEAN) 0.65 % SOLN nasal spray Place 1 spray into both nostrils 3 (three) times daily as needed for congestion.     No current facility-administered medications for this visit.     PAST MEDICAL HISTORY: Past Medical History:  Diagnosis Date  . Anxiety   . Colon polyp    hyperplastic  . Diverticulosis of colon (without mention of hemorrhage)   . Esophageal stricture   . GERD (gastroesophageal reflux disease)   . Hiatal hernia   . Lower extremity weakness   . OCD (obsessive compulsive disorder)   . Viral hepatitis 08/1983    PAST SURGICAL HISTORY: Past Surgical History:  Procedure Laterality Date  . COLONOSCOPY    . HEMORRHOID SURGERY     1965    FAMILY HISTORY: Family History  Problem Relation Age of Onset  . Diverticulosis Mother   . Melanoma Mother   . Lung cancer Father   . Colon cancer Other        1st cousion.  . Allergic rhinitis Neg Hx   . Angioedema Neg Hx   . Asthma Neg Hx   . Eczema Neg Hx   . Immunodeficiency Neg Hx   . Urticaria Neg Hx     SOCIAL HISTORY:  Social History   Social History  . Marital status: Married    Spouse name: N/A  . Number of children: 2  . Years of education: HS   Occupational History  . Retired    Social History Main Topics  . Smoking status: Never Smoker  . Smokeless tobacco: Never Used  . Alcohol use 3.5 oz/week    7 Standard drinks or equivalent per week     Comment: occasional  . Drug use: No  . Sexual activity: Not on file   Other Topics Concern  . Not  on file   Social History Narrative   Lives at home alone.   Right-handed.   No caffeine use.     PHYSICAL EXAM   Vitals:   02/21/17 1116  BP: 115/82  Pulse: 73  Weight: 143 lb 8 oz (65.1 kg)  Height: 5\' 7"  (1.702 m)    Not recorded      Body mass index is 22.48 kg/m.  PHYSICAL EXAMNIATION:  Gen: NAD, conversant, well nourised, obese, well groomed                     Cardiovascular: Regular rate rhythm, no peripheral edema, warm, nontender. Eyes: Conjunctivae clear without exudates or hemorrhage Neck: Supple, no carotid bruits. Pulmonary: Clear to auscultation bilaterally   NEUROLOGICAL EXAM:  MENTAL STATUS: Speech:    Speech is normal; fluent and spontaneous with normal comprehension.  Cognition:     Orientation to time, place and  person     Normal recent and remote memory     Normal Attention span and concentration     Normal Language, naming, repeating,spontaneous speech     Fund of knowledge   CRANIAL NERVES: CN II: Visual fields are full to confrontation. Fundoscopic exam is normal with sharp discs and no vascular changes. Pupils are round equal and briskly reactive to light. CN III, IV, VI: extraocular movement are normal. No ptosis. CN V: Facial sensation is intact to pinprick in all 3 divisions bilaterally. Corneal responses are intact.  CN VII: Face is symmetric with normal eye closure and smile. CN VIII: Hearing is normal to rubbing fingers CN IX, X: Palate elevates symmetrically. Phonation is normal. CN XI: Head turning and shoulder shrug are intact CN XII: Tongue is midline with normal movements and no atrophy.  MOTOR: There is no pronator drift of out-stretched arms. Muscle bulk and tone are normal. Muscle strength is normal.  REFLEXES: Reflexes are 1 and symmetric at the biceps, triceps,  2knees, and  absent at ankles. Plantar responses are flexor.  SENSORY: Intact to light touch, pinprick, positional sensation and vibratory sensation are  intact in fingers and toes.  COORDINATION: Rapid alternating movements and fine finger movements are intact. There is no dysmetria on finger-to-nose and heel-knee-shin.    GAIT/STANCE: He needs push up to get up from seated position, wide based, stiff, cautious, Romberg is absent.   DIAGNOSTIC DATA (LABS, IMAGING, TESTING) - I reviewed patient records, labs, notes, testing and imaging myself where available.   ASSESSMENT AND PLAN  Havier Deeb. is a 83 y.o. male   Subacute worsening gait abnormality since early 2017  He was found to have mild left arm, left leg weakness,  His gait abnormality on multifactorial, this including extensive brain atrophy, supratentorium lesions, more on the right supratentorium region,  I have referred him to physical therapy  Multifactorial,  History take daily aspirin 81 mg daily  Adam Swanson, M.D. Ph.D.  [facility] [address], [place], [place] [address] Ph: [phone] Fax: [phone]  CC: Dineen Kid, MD

## 2017-02-21 NOTE — Patient Instructions (Signed)
Asa 81 mg daily

## 2017-02-22 LAB — VITAMIN D 25 HYDROXY (VIT D DEFICIENCY, FRACTURES): Vit D, 25-Hydroxy: 35.8 ng/mL (ref 30.0–100.0)

## 2017-02-22 NOTE — Telephone Encounter (Signed)
Error

## 2017-02-26 DIAGNOSIS — M6281 Muscle weakness (generalized): Secondary | ICD-10-CM | POA: Diagnosis not present

## 2017-02-26 DIAGNOSIS — R2681 Unsteadiness on feet: Secondary | ICD-10-CM | POA: Diagnosis not present

## 2017-02-26 DIAGNOSIS — R2689 Other abnormalities of gait and mobility: Secondary | ICD-10-CM | POA: Diagnosis not present

## 2017-02-27 DIAGNOSIS — M6281 Muscle weakness (generalized): Secondary | ICD-10-CM | POA: Diagnosis not present

## 2017-02-27 DIAGNOSIS — R2689 Other abnormalities of gait and mobility: Secondary | ICD-10-CM | POA: Diagnosis not present

## 2017-02-27 DIAGNOSIS — R2681 Unsteadiness on feet: Secondary | ICD-10-CM | POA: Diagnosis not present

## 2017-02-28 DIAGNOSIS — R2681 Unsteadiness on feet: Secondary | ICD-10-CM | POA: Diagnosis not present

## 2017-02-28 DIAGNOSIS — M6281 Muscle weakness (generalized): Secondary | ICD-10-CM | POA: Diagnosis not present

## 2017-02-28 DIAGNOSIS — R2689 Other abnormalities of gait and mobility: Secondary | ICD-10-CM | POA: Diagnosis not present

## 2017-03-01 DIAGNOSIS — R2689 Other abnormalities of gait and mobility: Secondary | ICD-10-CM | POA: Diagnosis not present

## 2017-03-01 DIAGNOSIS — M6281 Muscle weakness (generalized): Secondary | ICD-10-CM | POA: Diagnosis not present

## 2017-03-01 DIAGNOSIS — R2681 Unsteadiness on feet: Secondary | ICD-10-CM | POA: Diagnosis not present

## 2017-03-04 DIAGNOSIS — M6281 Muscle weakness (generalized): Secondary | ICD-10-CM | POA: Diagnosis not present

## 2017-03-04 DIAGNOSIS — R2689 Other abnormalities of gait and mobility: Secondary | ICD-10-CM | POA: Diagnosis not present

## 2017-03-04 DIAGNOSIS — R2681 Unsteadiness on feet: Secondary | ICD-10-CM | POA: Diagnosis not present

## 2017-03-05 DIAGNOSIS — R2681 Unsteadiness on feet: Secondary | ICD-10-CM | POA: Diagnosis not present

## 2017-03-05 DIAGNOSIS — R2689 Other abnormalities of gait and mobility: Secondary | ICD-10-CM | POA: Diagnosis not present

## 2017-03-05 DIAGNOSIS — M6281 Muscle weakness (generalized): Secondary | ICD-10-CM | POA: Diagnosis not present

## 2017-03-06 DIAGNOSIS — R2689 Other abnormalities of gait and mobility: Secondary | ICD-10-CM | POA: Diagnosis not present

## 2017-03-06 DIAGNOSIS — R2681 Unsteadiness on feet: Secondary | ICD-10-CM | POA: Diagnosis not present

## 2017-03-06 DIAGNOSIS — M6281 Muscle weakness (generalized): Secondary | ICD-10-CM | POA: Diagnosis not present

## 2017-03-07 DIAGNOSIS — R2681 Unsteadiness on feet: Secondary | ICD-10-CM | POA: Diagnosis not present

## 2017-03-07 DIAGNOSIS — R2689 Other abnormalities of gait and mobility: Secondary | ICD-10-CM | POA: Diagnosis not present

## 2017-03-07 DIAGNOSIS — M6281 Muscle weakness (generalized): Secondary | ICD-10-CM | POA: Diagnosis not present

## 2017-03-08 DIAGNOSIS — R2681 Unsteadiness on feet: Secondary | ICD-10-CM | POA: Diagnosis not present

## 2017-03-08 DIAGNOSIS — R2689 Other abnormalities of gait and mobility: Secondary | ICD-10-CM | POA: Diagnosis not present

## 2017-03-08 DIAGNOSIS — M6281 Muscle weakness (generalized): Secondary | ICD-10-CM | POA: Diagnosis not present

## 2017-03-11 DIAGNOSIS — M6281 Muscle weakness (generalized): Secondary | ICD-10-CM | POA: Diagnosis not present

## 2017-03-11 DIAGNOSIS — R2681 Unsteadiness on feet: Secondary | ICD-10-CM | POA: Diagnosis not present

## 2017-03-11 DIAGNOSIS — R2689 Other abnormalities of gait and mobility: Secondary | ICD-10-CM | POA: Diagnosis not present

## 2017-03-12 DIAGNOSIS — R2681 Unsteadiness on feet: Secondary | ICD-10-CM | POA: Diagnosis not present

## 2017-03-12 DIAGNOSIS — R2689 Other abnormalities of gait and mobility: Secondary | ICD-10-CM | POA: Diagnosis not present

## 2017-03-12 DIAGNOSIS — M6281 Muscle weakness (generalized): Secondary | ICD-10-CM | POA: Diagnosis not present

## 2017-03-13 DIAGNOSIS — M6281 Muscle weakness (generalized): Secondary | ICD-10-CM | POA: Diagnosis not present

## 2017-03-13 DIAGNOSIS — R2681 Unsteadiness on feet: Secondary | ICD-10-CM | POA: Diagnosis not present

## 2017-03-13 DIAGNOSIS — R2689 Other abnormalities of gait and mobility: Secondary | ICD-10-CM | POA: Diagnosis not present

## 2017-03-14 DIAGNOSIS — R2681 Unsteadiness on feet: Secondary | ICD-10-CM | POA: Diagnosis not present

## 2017-03-14 DIAGNOSIS — R2689 Other abnormalities of gait and mobility: Secondary | ICD-10-CM | POA: Diagnosis not present

## 2017-03-14 DIAGNOSIS — M6281 Muscle weakness (generalized): Secondary | ICD-10-CM | POA: Diagnosis not present

## 2017-03-15 DIAGNOSIS — R2689 Other abnormalities of gait and mobility: Secondary | ICD-10-CM | POA: Diagnosis not present

## 2017-03-15 DIAGNOSIS — R2681 Unsteadiness on feet: Secondary | ICD-10-CM | POA: Diagnosis not present

## 2017-03-15 DIAGNOSIS — M6281 Muscle weakness (generalized): Secondary | ICD-10-CM | POA: Diagnosis not present

## 2017-03-18 DIAGNOSIS — R2689 Other abnormalities of gait and mobility: Secondary | ICD-10-CM | POA: Diagnosis not present

## 2017-03-18 DIAGNOSIS — R2681 Unsteadiness on feet: Secondary | ICD-10-CM | POA: Diagnosis not present

## 2017-03-18 DIAGNOSIS — M6281 Muscle weakness (generalized): Secondary | ICD-10-CM | POA: Diagnosis not present

## 2017-03-19 DIAGNOSIS — R2689 Other abnormalities of gait and mobility: Secondary | ICD-10-CM | POA: Diagnosis not present

## 2017-03-19 DIAGNOSIS — M6281 Muscle weakness (generalized): Secondary | ICD-10-CM | POA: Diagnosis not present

## 2017-03-19 DIAGNOSIS — R2681 Unsteadiness on feet: Secondary | ICD-10-CM | POA: Diagnosis not present

## 2017-03-20 DIAGNOSIS — M6281 Muscle weakness (generalized): Secondary | ICD-10-CM | POA: Diagnosis not present

## 2017-03-20 DIAGNOSIS — R2681 Unsteadiness on feet: Secondary | ICD-10-CM | POA: Diagnosis not present

## 2017-03-20 DIAGNOSIS — R2689 Other abnormalities of gait and mobility: Secondary | ICD-10-CM | POA: Diagnosis not present

## 2017-03-21 DIAGNOSIS — M6281 Muscle weakness (generalized): Secondary | ICD-10-CM | POA: Diagnosis not present

## 2017-03-21 DIAGNOSIS — R2689 Other abnormalities of gait and mobility: Secondary | ICD-10-CM | POA: Diagnosis not present

## 2017-03-21 DIAGNOSIS — R2681 Unsteadiness on feet: Secondary | ICD-10-CM | POA: Diagnosis not present

## 2017-03-22 DIAGNOSIS — R2689 Other abnormalities of gait and mobility: Secondary | ICD-10-CM | POA: Diagnosis not present

## 2017-03-22 DIAGNOSIS — M6281 Muscle weakness (generalized): Secondary | ICD-10-CM | POA: Diagnosis not present

## 2017-03-22 DIAGNOSIS — R2681 Unsteadiness on feet: Secondary | ICD-10-CM | POA: Diagnosis not present

## 2017-03-25 DIAGNOSIS — R2689 Other abnormalities of gait and mobility: Secondary | ICD-10-CM | POA: Diagnosis not present

## 2017-03-25 DIAGNOSIS — M6281 Muscle weakness (generalized): Secondary | ICD-10-CM | POA: Diagnosis not present

## 2017-03-25 DIAGNOSIS — R2681 Unsteadiness on feet: Secondary | ICD-10-CM | POA: Diagnosis not present

## 2017-03-26 ENCOUNTER — Ambulatory Visit: Payer: BLUE CROSS/BLUE SHIELD | Admitting: Endocrinology

## 2017-03-26 DIAGNOSIS — R2681 Unsteadiness on feet: Secondary | ICD-10-CM | POA: Diagnosis not present

## 2017-03-26 DIAGNOSIS — R2689 Other abnormalities of gait and mobility: Secondary | ICD-10-CM | POA: Diagnosis not present

## 2017-03-26 DIAGNOSIS — M6281 Muscle weakness (generalized): Secondary | ICD-10-CM | POA: Diagnosis not present

## 2017-03-27 DIAGNOSIS — R2681 Unsteadiness on feet: Secondary | ICD-10-CM | POA: Diagnosis not present

## 2017-03-27 DIAGNOSIS — M6281 Muscle weakness (generalized): Secondary | ICD-10-CM | POA: Diagnosis not present

## 2017-03-27 DIAGNOSIS — R2689 Other abnormalities of gait and mobility: Secondary | ICD-10-CM | POA: Diagnosis not present

## 2017-03-28 DIAGNOSIS — M6281 Muscle weakness (generalized): Secondary | ICD-10-CM | POA: Diagnosis not present

## 2017-03-28 DIAGNOSIS — R2681 Unsteadiness on feet: Secondary | ICD-10-CM | POA: Diagnosis not present

## 2017-03-28 DIAGNOSIS — R2689 Other abnormalities of gait and mobility: Secondary | ICD-10-CM | POA: Diagnosis not present

## 2017-03-29 DIAGNOSIS — R2689 Other abnormalities of gait and mobility: Secondary | ICD-10-CM | POA: Diagnosis not present

## 2017-03-29 DIAGNOSIS — R2681 Unsteadiness on feet: Secondary | ICD-10-CM | POA: Diagnosis not present

## 2017-03-29 DIAGNOSIS — M6281 Muscle weakness (generalized): Secondary | ICD-10-CM | POA: Diagnosis not present

## 2017-04-01 DIAGNOSIS — M6281 Muscle weakness (generalized): Secondary | ICD-10-CM | POA: Diagnosis not present

## 2017-04-01 DIAGNOSIS — R2689 Other abnormalities of gait and mobility: Secondary | ICD-10-CM | POA: Diagnosis not present

## 2017-04-01 DIAGNOSIS — R2681 Unsteadiness on feet: Secondary | ICD-10-CM | POA: Diagnosis not present

## 2017-04-02 DIAGNOSIS — R2681 Unsteadiness on feet: Secondary | ICD-10-CM | POA: Diagnosis not present

## 2017-04-02 DIAGNOSIS — M6281 Muscle weakness (generalized): Secondary | ICD-10-CM | POA: Diagnosis not present

## 2017-04-02 DIAGNOSIS — R2689 Other abnormalities of gait and mobility: Secondary | ICD-10-CM | POA: Diagnosis not present

## 2017-04-03 ENCOUNTER — Encounter: Payer: Self-pay | Admitting: Endocrinology

## 2017-04-03 ENCOUNTER — Ambulatory Visit (INDEPENDENT_AMBULATORY_CARE_PROVIDER_SITE_OTHER): Payer: Medicare Other | Admitting: Endocrinology

## 2017-04-03 VITALS — BP 130/76 | HR 72 | Wt 143.6 lb

## 2017-04-03 DIAGNOSIS — E871 Hypo-osmolality and hyponatremia: Secondary | ICD-10-CM

## 2017-04-03 DIAGNOSIS — R2689 Other abnormalities of gait and mobility: Secondary | ICD-10-CM | POA: Diagnosis not present

## 2017-04-03 DIAGNOSIS — M6281 Muscle weakness (generalized): Secondary | ICD-10-CM | POA: Diagnosis not present

## 2017-04-03 DIAGNOSIS — R2681 Unsteadiness on feet: Secondary | ICD-10-CM | POA: Diagnosis not present

## 2017-04-03 LAB — BASIC METABOLIC PANEL
BUN: 17 mg/dL (ref 6–23)
CO2: 31 mEq/L (ref 19–32)
Calcium: 9.4 mg/dL (ref 8.4–10.5)
Chloride: 102 mEq/L (ref 96–112)
Creatinine, Ser: 0.98 mg/dL (ref 0.40–1.50)
GFR: 76.82 mL/min (ref >60.00)
Glucose, Bld: 101 mg/dL — ABNORMAL HIGH (ref 70–99)
Potassium: 4.6 mEq/L (ref 3.5–5.1)
Sodium: 136 mEq/L (ref 135–145)

## 2017-04-03 NOTE — Patient Instructions (Addendum)
Please continue the same demeclocyline.   Blood tests are requested for you today.  We'll let you know about the results.  You do not need to limit salt intake, so you should eat a "liberal salt" diet.   Please continue to limit how much fluid you drink (maximum 2 quarts per day).   General advice is the less alcohol, the better. Please come back for a follow-up appointment in 2-3 months.

## 2017-04-03 NOTE — Progress Notes (Signed)
Subjective:    Patient ID: Adam Cota., male    DOB: Mar 13, 1928, 83 y.o.   MRN: [id number]  HPI Pt returns for f/u of hyponatremia (dx'ed 2012; no secondary cause was found; VP level was undetectable, and urine osm was 484, so nephrogenic cause is presumed; CT showed no evidence of hydronephrosis or urinary calculi; he was rx'ed [facility]omycin, which [facility]).  pt states he feels well in general, except for slight blurry vision.   Past Medical History:  Diagnosis Date  . Anxiety   . Colon polyp    hyperplastic  . Diverticulosis of colon (without mention of hemorrhage)   . Esophageal stricture   . GERD (gastroesophageal reflux disease)   . Hiatal hernia   . Lower extremity weakness   . OCD (obsessive compulsive disorder)   . Viral hepatitis 08/1983    Past Surgical History:  Procedure Laterality Date  . COLONOSCOPY    . [place]    Social History   Social History  . Marital status: Married    Spouse name: N/A  . Number of children: 2  . Years of education: HS   Occupational History  . Retired    Social History Main Topics  . Smoking status: Never Smoker  . Smokeless tobacco: Never Used  . Alcohol use 3.5 oz/week    7 Standard drinks or equivalent per week     Comment: occasional  . Drug use: No  . Sexual activity: Not on file   Other Topics Concern  . Not on file   Social History Narrative   Lives at home alone.   Right-handed.   No caffeine use.    Current Outpatient Prescriptions on File Prior to Visit  Medication Sig Dispense Refill  . Cholecalciferol (VITAMIN D) 1000 UNITS capsule Take 1,000 Units by mouth daily.      Marland Kitchen demeclocycline (DECLOMYCIN) 150 MG tablet Take 150 mg by mouth 2 (two) times daily.    Marland Kitchen EPINEPHrine (EPI-PEN) 0.3 mg/0.3 mL DEVI Inject 0.3 mg into the muscle once.    Marland Kitchen LORazepam (ATIVAN) 1 MG tablet Take 1-2 tablets (1-2 mg total) by mouth 2 (two) times daily. Take 1 tablet at noon and 2 tablets at  bedtime 30 tablet 0  . Multiple Vitamins-Minerals (PRESERVISION AREDS) CAPS Take 1 capsule by mouth 2 (two) times daily.    . polyethylene glycol (MIRALAX / GLYCOLAX) packet Take 17 g by mouth daily.      . sodium chloride (OCEAN) 0.65 % SOLN nasal spray Place 1 spray into both nostrils 3 (three) times daily as needed for congestion.     No current facility-administered medications on file prior to visit.     Allergies  Allergen Reactions  . Bee Venom Anaphylaxis    Other reaction(s): Other (See Comments) Loss of consciousness  . Nsaids     Other reaction(s): intol  . Other Other (See Comments)    Family History  Problem Relation Age of Onset  . Diverticulosis Mother   . Melanoma Mother   . Lung cancer Father   . Colon cancer Other        1st cousion.  . Allergic rhinitis Neg Hx   . Angioedema Neg Hx   . Asthma Neg Hx   . Eczema Neg Hx   . Immunodeficiency Neg Hx   . Urticaria Neg Hx     BP 130/76   Pulse 72   Wt 143 lb  9.6 oz (65.1 kg)   SpO2 98%   BMI 22.49 kg/m    Review of Systems Denies nausea.      Objective:   Physical Exam Vital signs: see vs page.  Gen: elderly, frail, no distress.   Gait: normal and steady.   Ext: no edema.       Assessment & Plan:  Hyponatremia: due for recheck Frail elderly state: he should minimize alcohol, or avoid altogether.  Patient Instructions  Please continue the same demeclocyline.   Blood tests are requested for you today.  We'll let you know about the results.  You do not need to limit salt intake, so you should eat a "liberal salt" diet.   Please continue to limit how much fluid you drink (maximum 2 quarts per day).   General advice is the less alcohol, the better. Please come back for a follow-up appointment in 2-3 months.

## 2017-04-04 ENCOUNTER — Telehealth: Payer: Self-pay

## 2017-04-04 DIAGNOSIS — M6281 Muscle weakness (generalized): Secondary | ICD-10-CM | POA: Diagnosis not present

## 2017-04-04 DIAGNOSIS — R2689 Other abnormalities of gait and mobility: Secondary | ICD-10-CM | POA: Diagnosis not present

## 2017-04-04 DIAGNOSIS — R2681 Unsteadiness on feet: Secondary | ICD-10-CM | POA: Diagnosis not present

## 2017-04-04 NOTE — Telephone Encounter (Signed)
Patient needs to know how often can he have an [place]. Glass of wine? Daily, or every other, etc. Call patient to advise, okay to leave a detailed message.

## 2017-04-04 NOTE — Telephone Encounter (Signed)
Patient is calling to have wellbutrin removed from his medication list.  Thank you,  -LL

## 2017-04-04 NOTE — Telephone Encounter (Signed)
Yes, that is fine I would be happy to see you back here as needed

## 2017-04-04 NOTE — Telephone Encounter (Signed)
Patient stated he will limit wine concsumption to 8oz daily and he also would like to know if he can have his sodium level checked at his PCP instead of having to drive here please advise

## 2017-04-04 NOTE — Telephone Encounter (Signed)
Left vm with the advice from the doctor and requested he call back and let us know if he has any other questions- would like for labs to be faxed here when he gets sodium checked at [facility]

## 2017-04-05 DIAGNOSIS — R2681 Unsteadiness on feet: Secondary | ICD-10-CM | POA: Diagnosis not present

## 2017-04-05 DIAGNOSIS — R2689 Other abnormalities of gait and mobility: Secondary | ICD-10-CM | POA: Diagnosis not present

## 2017-04-05 DIAGNOSIS — M6281 Muscle weakness (generalized): Secondary | ICD-10-CM | POA: Diagnosis not present

## 2017-04-05 NOTE — Telephone Encounter (Signed)
Left vm letting patient know that welbutrin has been removed from his medication list and that he needs to limit his wine consumption as much as posible

## 2017-04-25 DIAGNOSIS — E222 Syndrome of inappropriate secretion of antidiuretic hormone: Secondary | ICD-10-CM | POA: Diagnosis not present

## 2017-05-01 DIAGNOSIS — H02403 Unspecified ptosis of bilateral eyelids: Secondary | ICD-10-CM | POA: Diagnosis not present

## 2017-05-01 DIAGNOSIS — Z961 Presence of intraocular lens: Secondary | ICD-10-CM | POA: Diagnosis not present

## 2017-05-01 DIAGNOSIS — H353131 Nonexudative age-related macular degeneration, bilateral, early dry stage: Secondary | ICD-10-CM | POA: Diagnosis not present

## 2017-05-22 DIAGNOSIS — F339 Major depressive disorder, recurrent, unspecified: Secondary | ICD-10-CM | POA: Diagnosis not present

## 2017-05-23 DIAGNOSIS — F339 Major depressive disorder, recurrent, unspecified: Secondary | ICD-10-CM | POA: Diagnosis not present

## 2017-06-12 DIAGNOSIS — L821 Other seborrheic keratosis: Secondary | ICD-10-CM | POA: Diagnosis not present

## 2017-06-12 DIAGNOSIS — D692 Other nonthrombocytopenic purpura: Secondary | ICD-10-CM | POA: Diagnosis not present

## 2017-06-12 DIAGNOSIS — L570 Actinic keratosis: Secondary | ICD-10-CM | POA: Diagnosis not present

## 2017-06-12 DIAGNOSIS — I788 Other diseases of capillaries: Secondary | ICD-10-CM | POA: Diagnosis not present

## 2017-07-03 DIAGNOSIS — D3131 Benign neoplasm of right choroid: Secondary | ICD-10-CM | POA: Diagnosis not present

## 2017-07-03 DIAGNOSIS — H35363 Drusen (degenerative) of macula, bilateral: Secondary | ICD-10-CM | POA: Diagnosis not present

## 2017-07-03 DIAGNOSIS — H353131 Nonexudative age-related macular degeneration, bilateral, early dry stage: Secondary | ICD-10-CM | POA: Diagnosis not present

## 2017-07-03 DIAGNOSIS — H43811 Vitreous degeneration, right eye: Secondary | ICD-10-CM | POA: Diagnosis not present

## 2017-07-04 ENCOUNTER — Ambulatory Visit (INDEPENDENT_AMBULATORY_CARE_PROVIDER_SITE_OTHER): Payer: Medicare Other | Admitting: Endocrinology

## 2017-07-04 ENCOUNTER — Encounter: Payer: Self-pay | Admitting: Endocrinology

## 2017-07-04 VITALS — BP 118/68 | HR 70 | Wt 144.8 lb

## 2017-07-04 DIAGNOSIS — E871 Hypo-osmolality and hyponatremia: Secondary | ICD-10-CM

## 2017-07-04 LAB — BASIC METABOLIC PANEL
BUN: 19 mg/dL (ref 6–23)
CO2: 31 mEq/L (ref 19–32)
Calcium: 9.4 mg/dL (ref 8.4–10.5)
Chloride: 101 mEq/L (ref 96–112)
Creatinine, Ser: 1.01 mg/dL (ref 0.40–1.50)
GFR: 74.15 mL/min (ref >60.00)
Glucose, Bld: 96 mg/dL (ref 70–99)
Potassium: 4.7 mEq/L (ref 3.5–5.1)
Sodium: 136 mEq/L (ref 135–145)

## 2017-07-04 NOTE — Progress Notes (Signed)
Subjective:    Patient ID: Adam Swanson., male    DOB: Mar 04, 1928, 83 y.o.   MRN: [id number]  HPI Pt returns for f/u of hyponatremia (dx'ed 2012; no secondary cause was found; VP level was undetectable, and urine osm was 484, so nephogenic cause is presumed; CT showed no evidence of hydronephrosis or urinary calculi; he was rx'ed declomycin, which [facility]).  pt states he feels well in general, except for depression (sees psych).   Past Medical History:  Diagnosis Date  . Anxiety   . Colon polyp    hyperplastic  . Diverticulosis of colon (without mention of hemorrhage)   . Esophageal stricture   . GERD (gastroesophageal reflux disease)   . Hiatal hernia   . Lower extremity weakness   . OCD (obsessive compulsive disorder)   . Viral hepatitis 08/1983    Past Surgical History:  Procedure Laterality Date  . COLONOSCOPY    . [place]    Social History   Social History  . Marital status: Married    Spouse name: N/A  . Number of children: 2  . Years of education: HS   Occupational History  . Retired    Social History Main Topics  . Smoking status: Never Smoker  . Smokeless tobacco: Never Used  . Alcohol use 3.5 oz/week    7 Standard drinks or equivalent per week     Comment: occasional  . Drug use: No  . Sexual activity: Not on file   Other Topics Concern  . Not on file   Social History Narrative   Lives at home alone.   Right-handed.   No caffeine use.    Current Outpatient Prescriptions on File Prior to Visit  Medication Sig Dispense Refill  . Cholecalciferol (VITAMIN D) 1000 UNITS capsule Take 1,000 Units by mouth daily.      Marland Kitchen demeclocycline (DECLOMYCIN) 150 MG tablet Take 150 mg by mouth 2 (two) times daily.    Marland Kitchen EPINEPHrine (EPI-PEN) 0.3 mg/0.3 mL DEVI Inject 0.3 mg into the muscle once.    Marland Kitchen LORazepam (ATIVAN) 1 MG tablet Take 1-2 tablets (1-2 mg total) by mouth 2 (two) times daily. Take 1 tablet at noon and 2 tablets at  bedtime 30 tablet 0  . Multiple Vitamins-Minerals (PRESERVISION AREDS) CAPS Take 1 capsule by mouth 2 (two) times daily.    . polyethylene glycol (MIRALAX / GLYCOLAX) packet Take 17 g by mouth daily.      . sodium chloride (OCEAN) 0.65 % SOLN nasal spray Place 1 spray into both nostrils 3 (three) times daily as needed for congestion.     No current facility-administered medications on file prior to visit.     Allergies  Allergen Reactions  . Bee Venom Anaphylaxis    Other reaction(s): Other (See Comments) Loss of consciousness  . Nsaids     Other reaction(s): intol  . Other Other (See Comments)    Family History  Problem Relation Age of Onset  . Diverticulosis Mother   . Melanoma Mother   . Lung cancer Father   . Colon cancer Other        1st cousion.  . Allergic rhinitis Neg Hx   . Angioedema Neg Hx   . Asthma Neg Hx   . Eczema Neg Hx   . Immunodeficiency Neg Hx   . Urticaria Neg Hx     BP 118/68   Pulse 70   Wt 144 lb  12.8 oz (65.7 kg)   SpO2 97%   BMI 22.68 kg/m    Review of Systems Denies headache    Objective:   Physical Exam VITAL SIGNS:  See vs page GENERAL: no distress Ext: no leg edema.        Assessment & Plan:  Hyponatremia: due for recheck.   Patient Instructions  Please continue the same demeclocyline.   Blood tests are requested for you today.  We'll let you know about the results.  You do not need to limit salt intake, so you should eat a "liberal salt" diet.   Please continue to limit how much fluid you drink (maximum 2 quarts per day).   Please come back for a follow-up appointment in 3 months.

## 2017-07-04 NOTE — Patient Instructions (Signed)
Please continue the same demeclocyline.   Blood tests are requested for you today.  We'll let you know about the results.  You do not need to limit salt intake, so you should eat a "liberal salt" diet.   Please continue to limit how much fluid you drink (maximum 2 quarts per day).   Please come back for a follow-up appointment in 3 months.

## 2017-07-09 ENCOUNTER — Telehealth: Payer: Self-pay | Admitting: Endocrinology

## 2017-07-09 NOTE — Telephone Encounter (Signed)
Patient is waiting for lab results and sodium test since his appointment on 07/04/17. Please mail test results to the patient, per his request.

## 2017-07-09 NOTE — Telephone Encounter (Signed)
Left patient a message stating that I mailed him results the end of last week. If he didn't receive them in the next few days to call back & I would resend them.

## 2017-07-10 DIAGNOSIS — R351 Nocturia: Secondary | ICD-10-CM | POA: Diagnosis not present

## 2017-07-10 DIAGNOSIS — N401 Enlarged prostate with lower urinary tract symptoms: Secondary | ICD-10-CM | POA: Diagnosis not present

## 2017-07-11 DIAGNOSIS — F339 Major depressive disorder, recurrent, unspecified: Secondary | ICD-10-CM | POA: Diagnosis not present

## 2017-07-12 ENCOUNTER — Telehealth: Payer: Self-pay | Admitting: Endocrinology

## 2017-07-12 NOTE — Telephone Encounter (Signed)
I am resending results today via [employer].

## 2017-07-12 NOTE — Telephone Encounter (Signed)
Patient has still not received report re: Last visit-tests-labs. Please mail to patient as soon as possible.

## 2017-07-22 DIAGNOSIS — F339 Major depressive disorder, recurrent, unspecified: Secondary | ICD-10-CM | POA: Diagnosis not present

## 2017-08-08 DIAGNOSIS — E782 Mixed hyperlipidemia: Secondary | ICD-10-CM | POA: Diagnosis not present

## 2017-08-08 DIAGNOSIS — Z79899 Other long term (current) drug therapy: Secondary | ICD-10-CM | POA: Diagnosis not present

## 2017-08-08 DIAGNOSIS — E871 Hypo-osmolality and hyponatremia: Secondary | ICD-10-CM | POA: Diagnosis not present

## 2017-08-14 DIAGNOSIS — N402 Nodular prostate without lower urinary tract symptoms: Secondary | ICD-10-CM | POA: Diagnosis not present

## 2017-08-14 DIAGNOSIS — G4700 Insomnia, unspecified: Secondary | ICD-10-CM | POA: Diagnosis not present

## 2017-08-14 DIAGNOSIS — E222 Syndrome of inappropriate secretion of antidiuretic hormone: Secondary | ICD-10-CM | POA: Diagnosis not present

## 2017-08-14 DIAGNOSIS — Z0000 Encounter for general adult medical examination without abnormal findings: Secondary | ICD-10-CM | POA: Diagnosis not present

## 2017-08-14 DIAGNOSIS — K219 Gastro-esophageal reflux disease without esophagitis: Secondary | ICD-10-CM | POA: Diagnosis not present

## 2017-08-14 DIAGNOSIS — Z91038 Other insect allergy status: Secondary | ICD-10-CM | POA: Diagnosis not present

## 2017-08-14 DIAGNOSIS — M5136 Other intervertebral disc degeneration, lumbar region: Secondary | ICD-10-CM | POA: Diagnosis not present

## 2017-08-14 DIAGNOSIS — E782 Mixed hyperlipidemia: Secondary | ICD-10-CM | POA: Diagnosis not present

## 2017-08-14 DIAGNOSIS — F429 Obsessive-compulsive disorder, unspecified: Secondary | ICD-10-CM | POA: Diagnosis not present

## 2017-08-22 ENCOUNTER — Ambulatory Visit (INDEPENDENT_AMBULATORY_CARE_PROVIDER_SITE_OTHER): Payer: Medicare Other | Admitting: Neurology

## 2017-08-22 ENCOUNTER — Encounter: Payer: Self-pay | Admitting: Neurology

## 2017-08-22 VITALS — BP 107/63 | HR 64 | Ht 67.0 in | Wt 146.0 lb

## 2017-08-22 DIAGNOSIS — R269 Unspecified abnormalities of gait and mobility: Secondary | ICD-10-CM

## 2017-08-22 DIAGNOSIS — R531 Weakness: Secondary | ICD-10-CM | POA: Diagnosis not present

## 2017-08-22 NOTE — Progress Notes (Signed)
PATIENT: Adam Swanson. DOB: 1928/06/11  Chief Complaint  Patient presents with  . Gait Abnormality    He is here with his son, Adam Swanson.  He has been doing extra leg exercises without any improvement in gait.  His feet still feel heavy.      HISTORICAL  Adam Swanson. is a pleasant 83 years old right-handed male, accompanied by his son Adam Swanson, seen in refer by  his primary care physician Dr. Dineen Kid, for evaluation of gait abnormality, initial evaluation was on Jan 30 2017.  I reviewed and summarized the referring note, he had a history of lumbar spondylosis, had a compression fracture at T12-L1, history of diverticulitis, chronic anxiety,   He suffered pneumonia at the end of 2016, was admitted to the hospital in January 2017, he was treated with vancomycin, azithromycin, laboratory evaluation showed mild hyponatremia, echocardiogram showed ejection fraction 60-65%, no regional wall motion abnormality,  Shortly after discharge, he presented to the emergency room on November 06 2015 complains of low back pain, especially with movement.  Since then, he noticed gradual onset worsening gait abnormality, he now denies significant low back pain, no bilateral upper and lower extremity paresthesia, but he described difficulty maneuver his bilateral lower extremity, such as stepping on his brakes, difficulty getting up from seated position, he has to push on chair arm to get up from low seated position, He denies bowel and bladder incontinence, he denies bilateral upper extremity paresthesia or weakness,  While he ambulate, he felt like 5 pounds weight was on each feet, he has stiff gait, shuffling  Laboratory evaluations normal CMP, with creatinine of 1.23,  I personally reviewed CT lumbar in February 2017 showed chronic T12-L1 fracture, moderate spinal stenosis at L4, he was seen by neurosurgeon Dr. Arnoldo Morale, who recommended MRI of lumbar,  I reviewed MRI of lumbar spine without  contrast at 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 MRI of the brain showed mild supratentorium small vessel disease, periventricular white matter disease more on the right supratentorium region  Extensive laboratory evaluation in May 2018:  Normal hepatitis panel, negative acetylcholine receptor antibody, CPK, TSH, folic acid, RPR, U20, C-reactive protein, copper, ANA, and BMP,  On examination, he was noted to have mild left arm and left lower extremity weakness, mild unsteady gait  UPDATE Aug 22 2017: He is accompanied by his son at today's clinical visit, continue complains of mild left side difficulty, still highly function, exercise regularly  REVIEW OF SYSTEMS: Full 14 system review of systems performed and notable only for: Low back pain, walking difficulty, depression, anxiety,  ALLERGIES: Allergies  Allergen Reactions  . Bee Venom Anaphylaxis    Other reaction(s): Other (See Comments) Loss of consciousness  . Nsaids     Other reaction(s): intol  . Other Other (See Comments)  HOME MEDICATIONS: Current Outpatient Medications  Medication Sig Dispense Refill  . demeclocycline (DECLOMYCIN) 150  MG tablet Take 150 mg by mouth 2 (two) times daily.    Marland Kitchen LORazepam (ATIVAN) 1 MG tablet Take 1-2 tablets (1-2 mg total) by mouth 2 (two) times daily. Take 1 tablet at noon and 2 tablets at bedtime 30 tablet 0  . Multiple Vitamins-Minerals (PRESERVISION AREDS) CAPS Take 1 capsule by mouth 2 (two) times daily.    . polyethylene glycol (MIRALAX / GLYCOLAX) packet Take 17 g by mouth daily.      . sodium chloride (OCEAN) 0.65 % SOLN nasal spray Place 1 spray into both nostrils 3 (three) times daily as needed for congestion.     No current facility-administered medications for this visit.     PAST MEDICAL HISTORY: Past Medical History:  Diagnosis Date  . Anxiety   . Colon polyp    hyperplastic  . Diverticulosis of colon (without mention of hemorrhage)   . Esophageal stricture   . GERD (gastroesophageal reflux disease)   . Hiatal hernia   . Lower extremity weakness   . OCD (obsessive compulsive disorder)   . Viral hepatitis 08/1983    PAST SURGICAL HISTORY: Past Surgical History:  Procedure Laterality Date  . COLONOSCOPY    . HEMORRHOID SURGERY     1965    FAMILY HISTORY: Family History  Problem Relation Age of Onset  . Diverticulosis Mother   . Melanoma Mother   . Lung cancer Father   . Colon cancer Other        1st cousion.  . Allergic rhinitis Neg Hx   . Angioedema Neg Hx   . Asthma Neg Hx   . Eczema Neg Hx   . Immunodeficiency Neg Hx   . Urticaria Neg Hx     SOCIAL HISTORY:  Social History   Socioeconomic History  . Marital status: Married    Spouse name: Not on file  . Number of children: 2  . Years of education: HS  . Highest education level: Not on file  Social Needs  . Financial resource strain: Not on file  . Food insecurity - worry: Not on file  . Food insecurity - inability: Not on file  . Transportation needs - medical: Not on file  . Transportation needs - non-medical: Not on file  Occupational History  . Occupation: Retired  Tobacco Use  .  Smoking status: Never Smoker  . Smokeless tobacco: Never Used  Substance and Sexual Activity  . Alcohol use: Yes    Alcohol/week: 3.5 oz    Types: 7 Standard drinks or equivalent per week    Comment: occasional  . Drug use: No  . Sexual activity: Not on file  Other Topics Concern  . Not on file  Social History Narrative   Lives at home alone.   Right-handed.   No caffeine use.     PHYSICAL EXAM   Vitals:   08/22/17 1101  BP: 107/63  Pulse: 64  Weight: 146 lb (66.2 kg)  Height: 5\' 7"  (1.702 m)    Not recorded      Body mass index is 22.87 kg/m.  PHYSICAL EXAMNIATION:  Gen: NAD, conversant, well nourised, obese, well groomed                     Cardiovascular: Regular rate rhythm, no peripheral edema, warm, nontender. Eyes: Conjunctivae clear without exudates or hemorrhage Neck: Supple, no carotid bruits. Pulmonary: Clear to auscultation bilaterally  NEUROLOGICAL EXAM:  MENTAL STATUS: Speech:    Speech is normal; fluent and spontaneous with normal comprehension.  Cognition:     Orientation to time, place and person     Normal recent and remote memory     Normal Attention span and concentration     Normal Language, naming, repeating,spontaneous speech     Fund of knowledge   CRANIAL NERVES: CN II: Visual fields are full to confrontation. Fundoscopic exam is normal with sharp discs and no vascular changes. Pupils are round equal and briskly reactive to light. CN III, IV, VI: extraocular movement are normal. No ptosis. CN V: Facial sensation is intact to pinprick in all 3 divisions bilaterally. Corneal responses are intact.  CN VII: Face is symmetric with normal eye closure and smile. CN VIII: Hearing is normal to rubbing fingers CN IX, X: Palate elevates symmetrically. Phonation is normal. CN XI: Head turning and shoulder shrug are intact CN XII: Tongue is midline with normal movements and no atrophy.  MOTOR: There is no pronator drift of out-stretched  arms. Muscle bulk and tone are normal. Muscle strength is normal.  REFLEXES: Reflexes are 1 and symmetric at the biceps, triceps,  2knees, and  absent at ankles. Plantar responses are flexor.  SENSORY: Intact to light touch, pinprick, positional sensation and vibratory sensation are intact in fingers and toes.  COORDINATION: Rapid alternating movements and fine finger movements are intact. There is no dysmetria on finger-to-nose and heel-knee-shin.    GAIT/STANCE: He needs push up to get up from seated position,, left toe pointed outward, decreased left arm swing, Romberg is absent.   DIAGNOSTIC DATA (LABS, IMAGING, TESTING) - I reviewed patient records, labs, notes, testing and imaging myself where available.   ASSESSMENT AND PLAN  Adam Swanson. is a 83 y.o. male   Subacute worsening gait abnormality since early 2017  He was found to have mild left arm, left leg weakness,  His gait abnormality are multifactorial, this including extensive brain atrophy, supratentorium lesions, more on the right supratentorium region,  Continue aspirin daily, moderate exercise  Marcial Pacas, M.D. Ph.D.  [facility] [address] [place], [place] [address] Ph: [phone] Fax: [phone]  CC: Dineen Kid, MD

## 2017-08-28 DIAGNOSIS — F339 Major depressive disorder, recurrent, unspecified: Secondary | ICD-10-CM | POA: Diagnosis not present

## 2017-09-20 DIAGNOSIS — H18893 Other specified disorders of cornea, bilateral: Secondary | ICD-10-CM | POA: Diagnosis not present

## 2017-09-26 DIAGNOSIS — R531 Weakness: Secondary | ICD-10-CM | POA: Diagnosis not present

## 2017-09-26 DIAGNOSIS — R42 Dizziness and giddiness: Secondary | ICD-10-CM | POA: Diagnosis not present

## 2017-09-26 DIAGNOSIS — E222 Syndrome of inappropriate secretion of antidiuretic hormone: Secondary | ICD-10-CM | POA: Diagnosis not present

## 2017-10-09 ENCOUNTER — Ambulatory Visit: Payer: BLUE CROSS/BLUE SHIELD | Admitting: Endocrinology

## 2017-10-28 DIAGNOSIS — T1511XA Foreign body in conjunctival sac, right eye, initial encounter: Secondary | ICD-10-CM | POA: Diagnosis not present

## 2017-10-28 DIAGNOSIS — S0501XA Injury of conjunctiva and corneal abrasion without foreign body, right eye, initial encounter: Secondary | ICD-10-CM | POA: Diagnosis not present

## 2017-11-06 DIAGNOSIS — S0501XA Injury of conjunctiva and corneal abrasion without foreign body, right eye, initial encounter: Secondary | ICD-10-CM | POA: Diagnosis not present

## 2017-11-06 DIAGNOSIS — H02844 Edema of left upper eyelid: Secondary | ICD-10-CM | POA: Diagnosis not present

## 2017-11-07 DIAGNOSIS — S0501XA Injury of conjunctiva and corneal abrasion without foreign body, right eye, initial encounter: Secondary | ICD-10-CM | POA: Diagnosis not present

## 2017-11-07 DIAGNOSIS — H02844 Edema of left upper eyelid: Secondary | ICD-10-CM | POA: Diagnosis not present

## 2017-11-08 DIAGNOSIS — S0501XA Injury of conjunctiva and corneal abrasion without foreign body, right eye, initial encounter: Secondary | ICD-10-CM | POA: Diagnosis not present

## 2017-11-11 DIAGNOSIS — S0501XA Injury of conjunctiva and corneal abrasion without foreign body, right eye, initial encounter: Secondary | ICD-10-CM | POA: Diagnosis not present

## 2017-11-12 ENCOUNTER — Encounter: Payer: Self-pay | Admitting: Endocrinology

## 2017-11-12 ENCOUNTER — Ambulatory Visit (INDEPENDENT_AMBULATORY_CARE_PROVIDER_SITE_OTHER): Payer: Medicare Other | Admitting: Endocrinology

## 2017-11-12 VITALS — BP 100/70 | HR 75 | Temp 97.5°F | Wt 141.0 lb

## 2017-11-12 DIAGNOSIS — E871 Hypo-osmolality and hyponatremia: Secondary | ICD-10-CM

## 2017-11-12 LAB — URINALYSIS, ROUTINE W REFLEX MICROSCOPIC
Bilirubin Urine: NEGATIVE
Hgb urine dipstick: NEGATIVE
Ketones, ur: NEGATIVE
Leukocytes, UA: NEGATIVE
Nitrite: NEGATIVE
RBC / HPF: NONE SEEN (ref 0–?)
Specific Gravity, Urine: 1.015 (ref 1.000–1.030)
Total Protein, Urine: NEGATIVE
Urine Glucose: NEGATIVE
Urobilinogen, UA: 1 (ref 0.0–1.0)
WBC, UA: NONE SEEN (ref 0–?)
pH: 7 (ref 5.0–8.0)

## 2017-11-12 LAB — BASIC METABOLIC PANEL
BUN: 17 mg/dL (ref 6–23)
CO2: 30 mEq/L (ref 19–32)
Calcium: 9.5 mg/dL (ref 8.4–10.5)
Chloride: 101 mEq/L (ref 96–112)
Creatinine, Ser: 1.26 mg/dL (ref 0.40–1.50)
GFR: 57.4 mL/min — ABNORMAL LOW (ref >60.00)
Glucose, Bld: 85 mg/dL (ref 70–99)
Potassium: 4.5 mEq/L (ref 3.5–5.1)
Sodium: 135 mEq/L (ref 135–145)

## 2017-11-12 NOTE — Patient Instructions (Addendum)
Please continue the same demeclocyline.   Blood and urine tests are requested for you today.  We'll let you know about the results.  You do not need to limit salt intake, so you should eat a "liberal salt" diet.   Please continue to limit how much fluid you drink (maximum 2 quarts per day).   Please come back for a follow-up appointment in 3 months.

## 2017-11-12 NOTE — Progress Notes (Signed)
Subjective:    Patient ID: Adam Cota., male    DOB: 1928-06-28, 84 y.o.   MRN: [id number]  HPI Pt returns for f/u of hyponatremia (dx'ed 2012; no secondary cause was found; VP level was undetectable, and urine osm was 484, so nephogenic cause is presumed; CT showed no evidence of hydronephrosis or urinary calculi; he was rx[facility]ed [facility], [facility]).  pt states he feels well in general, except has has lost 3 lbs since last ov, despite good appetite.   Past Medical History:  Diagnosis Date  . Anxiety   . Colon polyp    hyperplastic  . Diverticulosis of colon (without mention of hemorrhage)   . Esophageal stricture   . GERD (gastroesophageal reflux disease)   . Hiatal hernia   . Lower extremity weakness   . OCD (obsessive compulsive disorder)   . Viral hepatitis 08/1983    Past Surgical History:  Procedure Laterality Date  . COLONOSCOPY    . [place]    Social History   Socioeconomic History  . Marital status: Married    Spouse name: Not on file  . Number of children: 2  . Years of education: HS  . Highest education level: Not on file  Social Needs  . Financial resource strain: Not on file  . Food insecurity - worry: Not on file  . Food insecurity - inability: Not on file  . Transportation needs - medical: Not on file  . Transportation needs - non-medical: Not on file  Occupational History  . Occupation: Retired  Tobacco Use  . Smoking status: Never Smoker  . Smokeless tobacco: Never Used  Substance and Sexual Activity  . Alcohol use: Yes    Alcohol/week: 3.5 oz    Types: 7 Standard drinks or equivalent per week    Comment: occasional  . Drug use: No  . Sexual activity: Not on file  Other Topics Concern  . Not on file  Social History Narrative   Lives at home alone.   Right-handed.   No caffeine use.    Current Outpatient Medications on File Prior to Visit  Medication Sig Dispense Refill  . demeclocycline  (DECLOMYCIN) 150 MG tablet Take 150 mg by mouth 2 (two) times daily.    Marland Kitchen LORazepam (ATIVAN) 1 MG tablet Take 1-2 tablets (1-2 mg total) by mouth 2 (two) times daily. Take 1 tablet at noon and 2 tablets at bedtime 30 tablet 0  . Multiple Vitamins-Minerals (PRESERVISION AREDS) CAPS Take 1 capsule by mouth 2 (two) times daily.    . polyethylene glycol (MIRALAX / GLYCOLAX) packet Take 17 g by mouth daily.      . sodium chloride (OCEAN) 0.65 % SOLN nasal spray Place 1 spray into both nostrils 3 (three) times daily as needed for congestion.     No current facility-administered medications on file prior to visit.     Allergies  Allergen Reactions  . Bee Venom Anaphylaxis    Other reaction(s): Other (See Comments) Loss of consciousness  . Nsaids     Other reaction(s): intol  . Other Other (See Comments)    Family History  Problem Relation Age of Onset  . Diverticulosis Mother   . Melanoma Mother   . Lung cancer Father   . Colon cancer Other        1st cousion.  . Allergic rhinitis Neg Hx   . Angioedema Neg Hx   . Asthma Neg Hx   .  Eczema Neg Hx   . Immunodeficiency Neg Hx   . Urticaria Neg Hx     BP 100/70 (BP Location: Right Arm, Patient Position: Sitting, Cuff Size: Normal)   Pulse 75   Temp (!) 97.5 F (36.4 C) (Oral)   Wt 141 lb (64 kg)   SpO2 94%   BMI 22.08 kg/m    Review of Systems Denies headache and nausea.      Objective:   Physical Exam VITAL SIGNS:  See vs page GENERAL: no distress Ext: no leg edema      Assessment & Plan:  Hyponatremia, due for recheck  Patient Instructions  Please continue the same demeclocyline.   Blood and urine tests are requested for you today.  We'll let you know about the results.  You do not need to limit salt intake, so you should eat a "liberal salt" diet.   Please continue to limit how much fluid you drink (maximum 2 quarts per day).   Please come back for a follow-up appointment in 3 months.

## 2017-11-14 ENCOUNTER — Telehealth: Payer: Self-pay | Admitting: Endocrinology

## 2017-11-14 DIAGNOSIS — S0501XA Injury of conjunctiva and corneal abrasion without foreign body, right eye, initial encounter: Secondary | ICD-10-CM | POA: Diagnosis not present

## 2017-11-14 NOTE — Telephone Encounter (Signed)
Patient is requesting that Lab test (urinie and Blood) that was requested that the results be mailed to him   Please advise.  Once this is mail could you please call patient, may leave message on VM.

## 2017-11-15 DIAGNOSIS — R351 Nocturia: Secondary | ICD-10-CM | POA: Diagnosis not present

## 2017-11-15 NOTE — Telephone Encounter (Signed)
I called and spoke with patient stating that I put labs in the mail to him yesterday afternoon.

## 2017-11-19 DIAGNOSIS — S0501XA Injury of conjunctiva and corneal abrasion without foreign body, right eye, initial encounter: Secondary | ICD-10-CM | POA: Diagnosis not present

## 2017-11-21 DIAGNOSIS — F339 Major depressive disorder, recurrent, unspecified: Secondary | ICD-10-CM | POA: Diagnosis not present

## 2017-11-25 DIAGNOSIS — H18899 Other specified disorders of cornea, unspecified eye: Secondary | ICD-10-CM | POA: Diagnosis not present

## 2017-11-27 DIAGNOSIS — J342 Deviated nasal septum: Secondary | ICD-10-CM | POA: Diagnosis not present

## 2017-11-27 DIAGNOSIS — R040 Epistaxis: Secondary | ICD-10-CM | POA: Diagnosis not present

## 2017-11-29 DIAGNOSIS — H18899 Other specified disorders of cornea, unspecified eye: Secondary | ICD-10-CM | POA: Diagnosis not present

## 2017-11-29 DIAGNOSIS — H01001 Unspecified blepharitis right upper eyelid: Secondary | ICD-10-CM | POA: Diagnosis not present

## 2017-12-02 DIAGNOSIS — H18899 Other specified disorders of cornea, unspecified eye: Secondary | ICD-10-CM | POA: Diagnosis not present

## 2017-12-02 DIAGNOSIS — H01001 Unspecified blepharitis right upper eyelid: Secondary | ICD-10-CM | POA: Diagnosis not present

## 2017-12-04 DIAGNOSIS — H18899 Other specified disorders of cornea, unspecified eye: Secondary | ICD-10-CM | POA: Diagnosis not present

## 2017-12-04 DIAGNOSIS — H01001 Unspecified blepharitis right upper eyelid: Secondary | ICD-10-CM | POA: Diagnosis not present

## 2017-12-05 DIAGNOSIS — R531 Weakness: Secondary | ICD-10-CM | POA: Diagnosis not present

## 2017-12-05 DIAGNOSIS — R42 Dizziness and giddiness: Secondary | ICD-10-CM | POA: Diagnosis not present

## 2017-12-05 DIAGNOSIS — M5136 Other intervertebral disc degeneration, lumbar region: Secondary | ICD-10-CM | POA: Diagnosis not present

## 2017-12-05 DIAGNOSIS — Z0000 Encounter for general adult medical examination without abnormal findings: Secondary | ICD-10-CM | POA: Diagnosis not present

## 2017-12-05 DIAGNOSIS — E222 Syndrome of inappropriate secretion of antidiuretic hormone: Secondary | ICD-10-CM | POA: Diagnosis not present

## 2017-12-05 DIAGNOSIS — N402 Nodular prostate without lower urinary tract symptoms: Secondary | ICD-10-CM | POA: Diagnosis not present

## 2017-12-05 DIAGNOSIS — G4700 Insomnia, unspecified: Secondary | ICD-10-CM | POA: Diagnosis not present

## 2017-12-05 DIAGNOSIS — K219 Gastro-esophageal reflux disease without esophagitis: Secondary | ICD-10-CM | POA: Diagnosis not present

## 2017-12-05 DIAGNOSIS — E782 Mixed hyperlipidemia: Secondary | ICD-10-CM | POA: Diagnosis not present

## 2017-12-05 DIAGNOSIS — Z91038 Other insect allergy status: Secondary | ICD-10-CM | POA: Diagnosis not present

## 2017-12-05 DIAGNOSIS — R300 Dysuria: Secondary | ICD-10-CM | POA: Diagnosis not present

## 2017-12-06 DIAGNOSIS — H18899 Other specified disorders of cornea, unspecified eye: Secondary | ICD-10-CM | POA: Diagnosis not present

## 2017-12-06 DIAGNOSIS — H01001 Unspecified blepharitis right upper eyelid: Secondary | ICD-10-CM | POA: Diagnosis not present

## 2017-12-10 DIAGNOSIS — H18899 Other specified disorders of cornea, unspecified eye: Secondary | ICD-10-CM | POA: Diagnosis not present

## 2017-12-10 DIAGNOSIS — S0501XA Injury of conjunctiva and corneal abrasion without foreign body, right eye, initial encounter: Secondary | ICD-10-CM | POA: Diagnosis not present

## 2017-12-11 DIAGNOSIS — L814 Other melanin hyperpigmentation: Secondary | ICD-10-CM | POA: Diagnosis not present

## 2017-12-11 DIAGNOSIS — B351 Tinea unguium: Secondary | ICD-10-CM | POA: Diagnosis not present

## 2017-12-11 DIAGNOSIS — L570 Actinic keratosis: Secondary | ICD-10-CM | POA: Diagnosis not present

## 2017-12-11 DIAGNOSIS — L821 Other seborrheic keratosis: Secondary | ICD-10-CM | POA: Diagnosis not present

## 2017-12-17 DIAGNOSIS — H18899 Other specified disorders of cornea, unspecified eye: Secondary | ICD-10-CM | POA: Diagnosis not present

## 2017-12-17 DIAGNOSIS — S0501XA Injury of conjunctiva and corneal abrasion without foreign body, right eye, initial encounter: Secondary | ICD-10-CM | POA: Diagnosis not present

## 2017-12-25 DIAGNOSIS — H02403 Unspecified ptosis of bilateral eyelids: Secondary | ICD-10-CM | POA: Diagnosis not present

## 2017-12-25 DIAGNOSIS — H01001 Unspecified blepharitis right upper eyelid: Secondary | ICD-10-CM | POA: Diagnosis not present

## 2017-12-25 DIAGNOSIS — H18899 Other specified disorders of cornea, unspecified eye: Secondary | ICD-10-CM | POA: Diagnosis not present

## 2017-12-25 DIAGNOSIS — S0501XA Injury of conjunctiva and corneal abrasion without foreign body, right eye, initial encounter: Secondary | ICD-10-CM | POA: Diagnosis not present

## 2017-12-31 DIAGNOSIS — H18899 Other specified disorders of cornea, unspecified eye: Secondary | ICD-10-CM | POA: Diagnosis not present

## 2017-12-31 DIAGNOSIS — H01001 Unspecified blepharitis right upper eyelid: Secondary | ICD-10-CM | POA: Diagnosis not present

## 2017-12-31 DIAGNOSIS — S0501XA Injury of conjunctiva and corneal abrasion without foreign body, right eye, initial encounter: Secondary | ICD-10-CM | POA: Diagnosis not present

## 2017-12-31 DIAGNOSIS — H02403 Unspecified ptosis of bilateral eyelids: Secondary | ICD-10-CM | POA: Diagnosis not present

## 2018-01-21 DIAGNOSIS — H18891 Other specified disorders of cornea, right eye: Secondary | ICD-10-CM | POA: Diagnosis not present

## 2018-01-27 DIAGNOSIS — Z01818 Encounter for other preprocedural examination: Secondary | ICD-10-CM | POA: Diagnosis not present

## 2018-01-27 DIAGNOSIS — H18891 Other specified disorders of cornea, right eye: Secondary | ICD-10-CM | POA: Diagnosis not present

## 2018-01-29 DIAGNOSIS — S0501XA Injury of conjunctiva and corneal abrasion without foreign body, right eye, initial encounter: Secondary | ICD-10-CM | POA: Diagnosis not present

## 2018-01-29 DIAGNOSIS — H02403 Unspecified ptosis of bilateral eyelids: Secondary | ICD-10-CM | POA: Diagnosis not present

## 2018-01-29 DIAGNOSIS — H18899 Other specified disorders of cornea, unspecified eye: Secondary | ICD-10-CM | POA: Diagnosis not present

## 2018-01-29 DIAGNOSIS — H01001 Unspecified blepharitis right upper eyelid: Secondary | ICD-10-CM | POA: Diagnosis not present

## 2018-02-05 DIAGNOSIS — H16211 Exposure keratoconjunctivitis, right eye: Secondary | ICD-10-CM | POA: Diagnosis not present

## 2018-02-05 DIAGNOSIS — H18891 Other specified disorders of cornea, right eye: Secondary | ICD-10-CM | POA: Diagnosis not present

## 2018-02-05 DIAGNOSIS — H02132 Senile ectropion of right lower eyelid: Secondary | ICD-10-CM | POA: Diagnosis not present

## 2018-02-05 DIAGNOSIS — H02103 Unspecified ectropion of right eye, unspecified eyelid: Secondary | ICD-10-CM | POA: Diagnosis not present

## 2018-02-12 ENCOUNTER — Encounter: Payer: Self-pay | Admitting: Endocrinology

## 2018-02-12 ENCOUNTER — Ambulatory Visit (INDEPENDENT_AMBULATORY_CARE_PROVIDER_SITE_OTHER): Payer: Medicare Other | Admitting: Endocrinology

## 2018-02-12 VITALS — BP 102/68 | HR 72 | Wt 141.0 lb

## 2018-02-12 DIAGNOSIS — E871 Hypo-osmolality and hyponatremia: Secondary | ICD-10-CM | POA: Diagnosis not present

## 2018-02-12 LAB — BASIC METABOLIC PANEL
BUN: 17 mg/dL (ref 6–23)
CO2: 30 mEq/L (ref 19–32)
Calcium: 9.3 mg/dL (ref 8.4–10.5)
Chloride: 101 mEq/L (ref 96–112)
Creatinine, Ser: 1.02 mg/dL (ref 0.40–1.50)
GFR: 73.2 mL/min (ref >60.00)
Glucose, Bld: 103 mg/dL — ABNORMAL HIGH (ref 70–99)
Potassium: 4.4 mEq/L (ref 3.5–5.1)
Sodium: 135 mEq/L (ref 135–145)

## 2018-02-12 NOTE — Progress Notes (Signed)
Subjective:    Patient ID: Adam Cota., male    DOB: 1928-01-03, 84 y.o.   MRN: [id number]  HPI Pt returns for f/u of hyponatremia (dx'ed 2012; no secondary cause was found; VP level was undetectable, and urine osm was 484, so nephogenic cause is presumed; CT showed no evidence of hydronephrosis or urinary calculi; he was rx'ed declomycin, which [facility]).  pt states he feels well in general.  No weight change.  He wants to reduce declomycin if possible Past Medical History:  Diagnosis Date  . Anxiety   . Colon polyp    hyperplastic  . Diverticulosis of colon (without mention of hemorrhage)   . Esophageal stricture   . GERD (gastroesophageal reflux disease)   . Hiatal hernia   . Lower extremity weakness   . OCD (obsessive compulsive disorder)   . Viral hepatitis 08/1983    Past Surgical History:  Procedure Laterality Date  . COLONOSCOPY    . [place]    Social History   Socioeconomic History  . Marital status: Married    Spouse name: Not on file  . Number of children: 2  . Years of education: HS  . Highest education level: Not on file  Occupational History  . Occupation: Retired  Scientific laboratory technician  . Financial resource strain: Not on file  . Food insecurity:    Worry: Not on file    Inability: Not on file  . Transportation needs:    Medical: Not on file    Non-medical: Not on file  Tobacco Use  . Smoking status: Never Smoker  . Smokeless tobacco: Never Used  Substance and Sexual Activity  . Alcohol use: Yes    Alcohol/week: 3.5 oz    Types: 7 Standard drinks or equivalent per week    Comment: occasional  . Drug use: No  . Sexual activity: Not on file  Lifestyle  . Physical activity:    Days per week: Not on file    Minutes per session: Not on file  . Stress: Not on file  Relationships  . Social connections:    Talks on phone: Not on file    Gets together: Not on file    Attends religious service: Not on file    Active  member of club or organization: Not on file    Attends meetings of clubs or organizations: Not on file    Relationship status: Not on file  . Intimate partner violence:    Fear of current or ex partner: Not on file    Emotionally abused: Not on file    Physically abused: Not on file    Forced sexual activity: Not on file  Other Topics Concern  . Not on file  Social History Narrative   Lives at home alone.   Right-handed.   No caffeine use.    Current Outpatient Medications on File Prior to Visit  Medication Sig Dispense Refill  . demeclocycline (DECLOMYCIN) 150 MG tablet Take 150 mg by mouth 2 (two) times daily.    Marland Kitchen LORazepam (ATIVAN) 1 MG tablet Take 1-2 tablets (1-2 mg total) by mouth 2 (two) times daily. Take 1 tablet at noon and 2 tablets at bedtime (Patient taking differently: Take 2 mg by mouth 2 (two) times daily. Take 2 tablets at bedtime) 30 tablet 0  . Multiple Vitamins-Minerals (PRESERVISION AREDS) CAPS Take 1 capsule by mouth 2 (two) times daily.    . polyethylene  glycol (MIRALAX / GLYCOLAX) packet Take 17 g by mouth daily.      . sodium chloride (OCEAN) 0.65 % SOLN nasal spray Place 1 spray into both nostrils 3 (three) times daily as needed for congestion.    . tamsulosin (FLOMAX) 0.4 MG CAPS capsule Take 0.4 mg by mouth at bedtime.     No current facility-administered medications on file prior to visit.     Allergies  Allergen Reactions  . Bee Venom Anaphylaxis    Other reaction(s): Other (See Comments) Loss of consciousness  . Nsaids     Other reaction(s): intol  . Other Other (See Comments)    Family History  Problem Relation Age of Onset  . Diverticulosis Mother   . Melanoma Mother   . Lung cancer Father   . Colon cancer Other        1st cousion.  . Allergic rhinitis Neg Hx   . Angioedema Neg Hx   . Asthma Neg Hx   . Eczema Neg Hx   . Immunodeficiency Neg Hx   . Urticaria Neg Hx     BP 102/68   Pulse 72   Wt 141 lb (64 kg)   SpO2 96%   BMI  22.08 kg/m   Review of Systems Denies falls.      Objective:   Physical Exam VITAL SIGNS:  See vs page.  GENERAL: no distress.  Ext: no leg edema.    Lab Results  Component Value Date   CREATININE 1.26 11/12/2017   BUN 17 11/12/2017   NA 135 11/12/2017   K 4.5 11/12/2017   CL 101 11/12/2017   CO2 30 11/12/2017      Assessment & Plan:  Hyponatremia: recheck today Frail elderly state: I encouraged pt to continue declomycin  Patient Instructions  Please continue the same demeclocyline.   Blood tests are requested for you today.  We'll let you know about the results.  You do not need to limit salt intake, so you should eat a "liberal salt" diet.   Please continue to limit how much fluid you drink (maximum 2 quarts per day).   Please come back for a follow-up appointment in 3 months.

## 2018-02-12 NOTE — Patient Instructions (Signed)
Please continue the same demeclocyline.   Blood tests are requested for you today.  We'll let you know about the results.  You do not need to limit salt intake, so you should eat a "liberal salt" diet.   Please continue to limit how much fluid you drink (maximum 2 quarts per day).   Please come back for a follow-up appointment in 3 months.

## 2018-02-19 DIAGNOSIS — M549 Dorsalgia, unspecified: Secondary | ICD-10-CM | POA: Diagnosis not present

## 2018-02-19 DIAGNOSIS — R197 Diarrhea, unspecified: Secondary | ICD-10-CM | POA: Diagnosis not present

## 2018-02-19 DIAGNOSIS — R32 Unspecified urinary incontinence: Secondary | ICD-10-CM | POA: Diagnosis not present

## 2018-02-25 DIAGNOSIS — S32050A Wedge compression fracture of fifth lumbar vertebra, initial encounter for closed fracture: Secondary | ICD-10-CM | POA: Diagnosis not present

## 2018-02-26 DIAGNOSIS — R32 Unspecified urinary incontinence: Secondary | ICD-10-CM | POA: Diagnosis not present

## 2018-03-07 DIAGNOSIS — H16299 Other keratoconjunctivitis, unspecified eye: Secondary | ICD-10-CM | POA: Diagnosis not present

## 2018-03-10 DIAGNOSIS — H04123 Dry eye syndrome of bilateral lacrimal glands: Secondary | ICD-10-CM | POA: Diagnosis not present

## 2018-03-10 DIAGNOSIS — H16299 Other keratoconjunctivitis, unspecified eye: Secondary | ICD-10-CM | POA: Diagnosis not present

## 2018-03-10 DIAGNOSIS — F339 Major depressive disorder, recurrent, unspecified: Secondary | ICD-10-CM | POA: Diagnosis not present

## 2018-03-31 DIAGNOSIS — H11821 Conjunctivochalasis, right eye: Secondary | ICD-10-CM | POA: Diagnosis not present

## 2018-03-31 DIAGNOSIS — H04123 Dry eye syndrome of bilateral lacrimal glands: Secondary | ICD-10-CM | POA: Diagnosis not present

## 2018-03-31 DIAGNOSIS — H16299 Other keratoconjunctivitis, unspecified eye: Secondary | ICD-10-CM | POA: Diagnosis not present

## 2018-04-08 DIAGNOSIS — Z87891 Personal history of nicotine dependence: Secondary | ICD-10-CM | POA: Diagnosis not present

## 2018-04-08 DIAGNOSIS — H1859 Other hereditary corneal dystrophies: Secondary | ICD-10-CM | POA: Diagnosis not present

## 2018-04-08 DIAGNOSIS — H25811 Combined forms of age-related cataract, right eye: Secondary | ICD-10-CM | POA: Diagnosis not present

## 2018-04-08 DIAGNOSIS — H16291 Other keratoconjunctivitis, right eye: Secondary | ICD-10-CM | POA: Diagnosis not present

## 2018-04-08 DIAGNOSIS — H119 Unspecified disorder of conjunctiva: Secondary | ICD-10-CM | POA: Diagnosis not present

## 2018-05-05 DIAGNOSIS — R351 Nocturia: Secondary | ICD-10-CM | POA: Diagnosis not present

## 2018-05-05 DIAGNOSIS — N3941 Urge incontinence: Secondary | ICD-10-CM | POA: Diagnosis not present

## 2018-05-05 DIAGNOSIS — N401 Enlarged prostate with lower urinary tract symptoms: Secondary | ICD-10-CM | POA: Diagnosis not present

## 2018-05-15 ENCOUNTER — Encounter: Payer: Self-pay | Admitting: Endocrinology

## 2018-05-15 ENCOUNTER — Ambulatory Visit (INDEPENDENT_AMBULATORY_CARE_PROVIDER_SITE_OTHER): Payer: Medicare Other | Admitting: Endocrinology

## 2018-05-15 VITALS — BP 104/62 | HR 72 | Wt 139.2 lb

## 2018-05-15 DIAGNOSIS — E871 Hypo-osmolality and hyponatremia: Secondary | ICD-10-CM

## 2018-05-15 LAB — BASIC METABOLIC PANEL
BUN: 17 mg/dL (ref 6–23)
CO2: 31 mEq/L (ref 19–32)
Calcium: 9 mg/dL (ref 8.4–10.5)
Chloride: 100 mEq/L (ref 96–112)
Creatinine, Ser: 0.95 mg/dL (ref 0.40–1.50)
GFR: 79.42 mL/min (ref >60.00)
Glucose, Bld: 99 mg/dL (ref 70–99)
Potassium: 4.5 mEq/L (ref 3.5–5.1)
Sodium: 133 mEq/L — ABNORMAL LOW (ref 135–145)

## 2018-05-15 MED ORDER — SODIUM CHLORIDE 1 G PO TABS: 2.0000 g | ORAL_TABLET | Freq: Every day | ORAL | 3 refills | Status: DC

## 2018-05-15 NOTE — Patient Instructions (Addendum)
Please continue the same demeclocyline.   Blood tests are requested for you today.  We'll let you know about the results.  You do not need to limit salt intake, so you should eat a "liberal salt" diet.   If necessary, we can add salt tablets.  Please continue to limit how much fluid you drink (maximum 2 quarts per day).   Please come back for a follow-up appointment in 3 months.

## 2018-05-15 NOTE — Progress Notes (Signed)
Subjective:    Patient ID: Adam Swanson., male    DOB: 1928/08/31, 84 y.o.   MRN: [id number]  HPI Pt returns for f/u of hyponatremia (dx'ed 2012; no secondary cause was found; VP level was undetectable, and urine osm was 484, so nephogenic cause is presumed; CT showed no evidence of hydronephrosis or urinary calculi; he was rx'ed declomycin, which [facility]).  Main symptom is back pain.   Past Medical History:  Diagnosis Date  . Anxiety   . Colon polyp    hyperplastic  . Diverticulosis of colon (without mention of hemorrhage)   . Esophageal stricture   . GERD (gastroesophageal reflux disease)   . Hiatal hernia   . Lower extremity weakness   . OCD (obsessive compulsive disorder)   . Viral hepatitis 08/1983    Past Surgical History:  Procedure Laterality Date  . COLONOSCOPY    . [place]    Social History   Socioeconomic History  . Marital status: Married    Spouse name: Not on file  . Number of children: 2  . Years of education: HS  . Highest education level: Not on file  Occupational History  . Occupation: Retired  Scientific laboratory technician  . Financial resource strain: Not on file  . Food insecurity:    Worry: Not on file    Inability: Not on file  . Transportation needs:    Medical: Not on file    Non-medical: Not on file  Tobacco Use  . Smoking status: Never Smoker  . Smokeless tobacco: Never Used  Substance and Sexual Activity  . Alcohol use: Yes    Alcohol/week: 7.0 standard drinks    Types: 7 Standard drinks or equivalent per week    Comment: occasional  . Drug use: No  . Sexual activity: Not on file  Lifestyle  . Physical activity:    Days per week: Not on file    Minutes per session: Not on file  . Stress: Not on file  Relationships  . Social connections:    Talks on phone: Not on file    Gets together: Not on file    Attends religious service: Not on file    Active member of club or organization: Not on file    Attends  meetings of clubs or organizations: Not on file    Relationship status: Not on file  . Intimate partner violence:    Fear of current or ex partner: Not on file    Emotionally abused: Not on file    Physically abused: Not on file    Forced sexual activity: Not on file  Other Topics Concern  . Not on file  Social History Narrative   Lives at home alone.   Right-handed.   No caffeine use.    Current Outpatient Medications on File Prior to Visit  Medication Sig Dispense Refill  . demeclocycline (DECLOMYCIN) 150 MG tablet Take 150 mg by mouth 2 (two) times daily.    Marland Kitchen LORazepam (ATIVAN) 1 MG tablet Take 1-2 tablets (1-2 mg total) by mouth 2 (two) times daily. Take 1 tablet at noon and 2 tablets at bedtime (Patient taking differently: Take 2 mg by mouth 2 (two) times daily. Take 2 tablets at bedtime) 30 tablet 0  . Multiple Vitamins-Minerals (PRESERVISION AREDS) CAPS Take 1 capsule by mouth 2 (two) times daily.    . polyethylene glycol (MIRALAX / GLYCOLAX) packet Take 17 g by mouth daily.      Marland Kitchen  sodium chloride (OCEAN) 0.65 % SOLN nasal spray Place 1 spray into both nostrils 3 (three) times daily as needed for congestion.    . tamsulosin (FLOMAX) 0.4 MG CAPS capsule Take 0.4 mg by mouth at bedtime.     No current facility-administered medications on file prior to visit.     Allergies  Allergen Reactions  . Bee Venom Anaphylaxis    Other reaction(s): Other (See Comments) Loss of consciousness  . Nsaids     Other reaction(s): intol  . Other Other (See Comments)    Family History  Problem Relation Age of Onset  . Diverticulosis Mother   . Melanoma Mother   . Lung cancer Father   . Colon cancer Other        1st cousion.  . Allergic rhinitis Neg Hx   . Angioedema Neg Hx   . Asthma Neg Hx   . Eczema Neg Hx   . Immunodeficiency Neg Hx   . Urticaria Neg Hx     BP 104/62 (BP Location: Left Arm)   Pulse 72   Wt 139 lb 3.2 oz (63.1 kg)   SpO2 94%   BMI 21.80 kg/m    Review  of Systems Denies dizziness and sob.     Objective:   Physical Exam VITAL SIGNS:  See vs page GENERAL: no distress Ext: trace bilat leg edema, and bilat vv's.   Gait: slow but steady.   Lab Results  Component Value Date   CREATININE 0.95 05/15/2018   BUN 17 05/15/2018   NA 133 (L) 05/15/2018   K 4.5 05/15/2018   CL 100 05/15/2018   CO2 31 05/15/2018      Assessment & Plan:  Hyponatremia: worse.  I rx'ed NaCl tabs.    Patient Instructions  Please continue the same demeclocyline.   Blood tests are requested for you today.  We'll let you know about the results.  You do not need to limit salt intake, so you should eat a "liberal salt" diet.   If necessary, we can add salt tablets.  Please continue to limit how much fluid you drink (maximum 2 quarts per day).   Please come back for a follow-up appointment in 3 months.

## 2018-05-19 ENCOUNTER — Telehealth: Payer: Self-pay | Admitting: Endocrinology

## 2018-05-19 NOTE — Telephone Encounter (Signed)
Please clarify that dosage was correct

## 2018-05-19 NOTE — Telephone Encounter (Signed)
Katie from [facility] # [phone]  Calling regarding the sodium chloride 1 gram--it is a high dose needs clarification and approval to fill this dosing

## 2018-05-19 NOTE — Telephone Encounter (Signed)
Pharmacy informed.

## 2018-05-19 NOTE — Telephone Encounter (Signed)
Dosage is correct 

## 2018-05-21 ENCOUNTER — Telehealth: Payer: Self-pay | Admitting: Endocrinology

## 2018-05-21 DIAGNOSIS — M5136 Other intervertebral disc degeneration, lumbar region: Secondary | ICD-10-CM | POA: Diagnosis not present

## 2018-05-21 DIAGNOSIS — Z8781 Personal history of (healed) traumatic fracture: Secondary | ICD-10-CM | POA: Diagnosis not present

## 2018-05-21 NOTE — Telephone Encounter (Signed)
[facility] is needing the patients most recent office notes and labs faxed to their office   Fax- [phone] ATTN: Mary Sella

## 2018-05-22 DIAGNOSIS — H353131 Nonexudative age-related macular degeneration, bilateral, early dry stage: Secondary | ICD-10-CM | POA: Diagnosis not present

## 2018-05-22 DIAGNOSIS — H5213 Myopia, bilateral: Secondary | ICD-10-CM | POA: Diagnosis not present

## 2018-05-22 DIAGNOSIS — H02403 Unspecified ptosis of bilateral eyelids: Secondary | ICD-10-CM | POA: Diagnosis not present

## 2018-05-22 NOTE — Telephone Encounter (Signed)
I did not see a ROI for [facility] not sure I can send what's requested

## 2018-05-28 DIAGNOSIS — F339 Major depressive disorder, recurrent, unspecified: Secondary | ICD-10-CM | POA: Diagnosis not present

## 2018-06-14 ENCOUNTER — Emergency Department (HOSPITAL_COMMUNITY)
Admission: EM | Admit: 2018-06-14 | Discharge: 2018-06-14 | Disposition: A | Discharge status: Home / Self Care | Payer: Medicare Other | Attending: Emergency Medicine | Admitting: Emergency Medicine

## 2018-06-14 ENCOUNTER — Emergency Department (HOSPITAL_COMMUNITY): Payer: Medicare Other

## 2018-06-14 DIAGNOSIS — S32019A Unspecified fracture of first lumbar vertebra, initial encounter for closed fracture: Secondary | ICD-10-CM | POA: Diagnosis not present

## 2018-06-14 DIAGNOSIS — S32050A Wedge compression fracture of fifth lumbar vertebra, initial encounter for closed fracture: Secondary | ICD-10-CM | POA: Insufficient documentation

## 2018-06-14 DIAGNOSIS — S22080A Wedge compression fracture of T11-T12 vertebra, initial encounter for closed fracture: Secondary | ICD-10-CM | POA: Diagnosis not present

## 2018-06-14 DIAGNOSIS — S32059A Unspecified fracture of fifth lumbar vertebra, initial encounter for closed fracture: Secondary | ICD-10-CM | POA: Diagnosis not present

## 2018-06-14 DIAGNOSIS — X58XXXA Exposure to other specified factors, initial encounter: Secondary | ICD-10-CM | POA: Diagnosis not present

## 2018-06-14 DIAGNOSIS — M545 Low back pain: Secondary | ICD-10-CM | POA: Diagnosis not present

## 2018-06-14 DIAGNOSIS — S32010A Wedge compression fracture of first lumbar vertebra, initial encounter for closed fracture: Secondary | ICD-10-CM | POA: Diagnosis not present

## 2018-06-14 DIAGNOSIS — Z79899 Other long term (current) drug therapy: Secondary | ICD-10-CM | POA: Diagnosis not present

## 2018-06-14 DIAGNOSIS — S32029A Unspecified fracture of second lumbar vertebra, initial encounter for closed fracture: Secondary | ICD-10-CM | POA: Diagnosis not present

## 2018-06-14 DIAGNOSIS — Y929 Unspecified place or not applicable: Secondary | ICD-10-CM | POA: Diagnosis not present

## 2018-06-14 DIAGNOSIS — S32020A Wedge compression fracture of second lumbar vertebra, initial encounter for closed fracture: Secondary | ICD-10-CM | POA: Insufficient documentation

## 2018-06-14 DIAGNOSIS — Y999 Unspecified external cause status: Secondary | ICD-10-CM | POA: Diagnosis not present

## 2018-06-14 DIAGNOSIS — Y939 Activity, unspecified: Secondary | ICD-10-CM | POA: Insufficient documentation

## 2018-06-14 DIAGNOSIS — S3992XA Unspecified injury of lower back, initial encounter: Secondary | ICD-10-CM | POA: Diagnosis present

## 2018-06-14 DIAGNOSIS — M5489 Other dorsalgia: Secondary | ICD-10-CM | POA: Diagnosis not present

## 2018-06-14 DIAGNOSIS — R52 Pain, unspecified: Secondary | ICD-10-CM | POA: Diagnosis not present

## 2018-06-14 LAB — POC OCCULT BLOOD, ED: Fecal Occult Bld: NEGATIVE

## 2018-06-14 MED ORDER — HYDROCODONE-ACETAMINOPHEN 5-325 MG PO TABS: 1.0000 | ORAL_TABLET | Freq: Four times a day (QID) | ORAL | 0 refills | Status: DC | PRN

## 2018-06-14 NOTE — ED Notes (Signed)
Bed: YT46 Expected date:  Expected time:  Means of arrival:  Comments: 84 yo M back pain

## 2018-06-14 NOTE — ED Provider Notes (Signed)
[facility] DEPT Provider Note   CSN: [id number] Arrival date & time: 06/14/18  1456     History   Chief Complaint Chief Complaint  Patient presents with  . Back Pain    HPI Adam Swanson. is a 84 y.o. male.  The history is provided by the patient. No language interpreter was used.  Back Pain       84 year old male with history of anxiety, recurrent low back pain brought here via EMS from home for evaluation of back pain.  Patient report for the past month he has had recurrent lower back pain.  Patient described pain as a sharp shooting catching sensation, to his lower back, nonradiating but usually present when he tries to sit up, and with movement.  Pain is minimal at rest.  For the past week he also endorsed not having regular bowel movement as having a bowel movement causing pain to his back.  He does not feel constipated.  He mention losing approximately 10 pounds of weight within the past month.  He denies any associated fever, chills, abdominal pain, dysuria, hematuria or, leg weakness or numbness, saddle anesthesia or rash.  He denies any recent injury.  He lives at home by himself and does not use any assistive walking device.  Past Medical History:  Diagnosis Date  . Anxiety   . Colon polyp    hyperplastic  . Diverticulosis of colon (without mention of hemorrhage)   . Esophageal stricture   . GERD (gastroesophageal reflux disease)   . Hiatal hernia   . Lower extremity weakness   . OCD (obsessive compulsive disorder)   . Viral hepatitis 08/1983    Patient Active Problem List   Diagnosis Date Noted  . Gait abnormality 01/30/2017  . Weakness 01/28/2017  . Low back pain without sciatica 01/28/2017  . Bronchopneumonia 09/24/2015  . Hyponatremia 09/24/2015  . Abnormal ECG 09/24/2015  . HCAP (healthcare-associated pneumonia) 09/24/2015  . Heart murmur 09/24/2015  . 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 Benign neoplasm  of colon 04/09/2011  . Unspecified constipation 04/09/2011  . Constipation 01/18/2011  . Change in bowel function 01/18/2011  . 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 General symptom  01/18/2011  . Oropharyngeal dysphagia 01/18/2011  . DIVERTICULOSIS, COLON 05/14/2007  . HIATAL HERNIA 03/19/2003    Past Surgical History:  Procedure Laterality Date  . COLONOSCOPY    . [place] Medications    Prior to Admission medications   Medication Sig Start Date End Date Taking? Authorizing Provider  demeclocycline (DECLOMYCIN) 150 MG tablet Take 150 mg by mouth 2 (two) times daily.    [provider]  LORazepam (ATIVAN) 1 MG tablet Take 1-2 tablets (1-2 mg total) by mouth 2 (two) times daily. Take 1 tablet at noon and 2 tablets at bedtime Patient taking differently: Take 2 mg by mouth 2 (two) times daily. Take 2 tablets at bedtime 09/26/15   Bonnielee Haff, MD  Multiple Vitamins-Minerals (PRESERVISION AREDS) CAPS Take 1 capsule by mouth 2 (two) times daily.    [provider]  polyethylene glycol (MIRALAX / GLYCOLAX) packet Take 17 g by mouth daily.      [provider]  sodium chloride (OCEAN) 0.65 % SOLN nasal spray Place 1 spray into both nostrils 3 (three) times daily as needed for congestion.    [provider]  sodium chloride 1  g tablet Take 2 tablets (2 g total) by mouth daily. 05/15/18   Renato Shin, MD  tamsulosin (FLOMAX) 0.4 MG CAPS capsule Take 0.4 mg by mouth at bedtime.    [provider]    Family History Family History  Problem Relation Age of Onset  . Diverticulosis Mother   . Melanoma Mother   . Lung cancer Father   . Colon cancer Other        1st cousion.  . Allergic rhinitis Neg Hx   . Angioedema Neg Hx   . Asthma Neg Hx   . Eczema Neg Hx   . Immunodeficiency Neg Hx   . Urticaria Neg Hx     Social History Social History   Tobacco Use  . Smoking  status: Never Smoker  . Smokeless tobacco: Never Used  Substance Use Topics  . Alcohol use: Yes    Alcohol/week: 7.0 standard drinks    Types: 7 Standard drinks or equivalent per week    Comment: occasional  . Drug use: No     Allergies   Bee venom; Nsaids; and Other   Review of Systems Review of Systems  Musculoskeletal: Positive for back pain.  All other systems reviewed and are negative.    Physical Exam Updated Vital Signs BP 128/84 (BP Location: Right Arm)   Pulse 73   Temp 98 F (36.7 C) (Oral)   Resp 18   SpO2 98%   Physical Exam  Constitutional: He appears well-developed and well-nourished. No distress.  HENT:  Head: Atraumatic.  Eyes: Conjunctivae are normal.  Neck: Neck supple.  Cardiovascular: Normal rate, regular rhythm and intact distal pulses.  Pulmonary/Chest: Effort normal and breath sounds normal.  Abdominal: Soft. Bowel sounds are normal. He exhibits no distension. There is no tenderness.  Genitourinary:  Genitourinary Comments: Chaperone present during exam.  Normal rectal tone, no obvious rectal mass, no stool impaction, normal color stool on glove.  Musculoskeletal: He exhibits no tenderness (No significant midline spine tenderness on palpation however patient wincing pain when he sits up.).  5 out of 5 strength to bilateral lower extremities with intact patellar deep tendon reflex and no foot drops.  Neurological: He is alert.  Skin: No rash noted.  Psychiatric: He has a normal mood and affect.  Nursing note and vitals reviewed.    ED Treatments / Results  Labs (all labs ordered are listed, but only abnormal results are displayed) Labs Reviewed  POC OCCULT BLOOD, ED    EKG None  Radiology Ct Lumbar Spine Wo Contrast  Result Date: 06/14/2018 CLINICAL DATA:  Low back pain for 1 month.  No known injury. EXAM: CT LUMBAR SPINE WITHOUT CONTRAST TECHNIQUE: Multidetector CT imaging of the lumbar spine was performed without intravenous  contrast administration. Multiplanar CT image reconstructions were also generated. COMPARISON:  MRI lumbar spine 07/19/2016. Plain films lumbar spine 02/19/2018. FINDINGS: Segmentation: Standard. Alignment: There is convex left scoliosis.  No listhesis. Vertebrae: The patient has compression fractures of T11, T12, L1, L2 and L5. The T12, L1 and L5 fractures are seen on one or both of the prior exam is consistent with remote injuries. The L2 compression fracture is new since the prior examinations. There is up to 80% vertebral body height loss centrally and L2. The patient also has a compression fracture of T11. T11 is not imaged on the prior examinations but fracture margins appear sharp suggestive of acute or subacute injury. There is rotator body height loss scratch the fracture is biconcave with  vertebral body height loss of up to 80-90%. Paraspinal and other soft tissues: Atherosclerosis is noted. Disc levels: T11-12: No bony retropulsion. The central canal and foramina appear open. T12-L1: No bony retropulsion. The central canal and foramina appear open. L1-2: No bony retropulsion. The central canal and foramina appear open. Facet arthropathy on the right noted. L2-3: Mild facet degenerative change.  Otherwise negative. L3-4: Mild-to-moderate facet degenerative change. Otherwise negative. L4-5: Mild bony retropulsion off the superior endplate of L5. The central canal and foramina appear open. L5-S1: Negative. IMPRESSION: T11, T12, L1, L2 and L5 compression fractures. The T11 and L2 compression fractures appear acute or subacute. The remainder of the fractures are remote. Mild lumbar spondylosis without central canal narrowing. Electronically Signed   By: Inge Rise M.D.   On: 06/14/2018 16:07    Procedures Procedures (including critical care time)  Medications Ordered in ED Medications - No data to display   Initial Impression / Assessment and Plan / ED Course  I have reviewed the triage vital  signs and the nursing notes.  Pertinent labs & imaging results that were available during my care of the patient were reviewed by me and considered in my medical decision making (see chart for details).     BP 121/75   Pulse 66   Temp 98 F (36.7 C) (Oral)   Resp 16   SpO2 95%    Final Clinical Impressions(s) / ED Diagnoses   Final diagnoses:  [place]edge compression fracture of t11-T12 vertebra, initial encounter for closed fracture (HCC)  Compression fracture of L1 lumbar vertebra, closed, initial encounter ([place])  Compression fracture of L2 lumbar vertebra, closed, initial encounter ([place])  Compression fracture of L5 lumbar vertebra, closed, initial encounter [facility])    ED Discharge Orders         Ordered    HYDROcodone-acetaminophen (NORCO/VICODIN) 5-325 MG tablet  Every 6 hours PRN     06/14/18 1857         3:35 PM Patient complaining of low back pain worsening with movement, likely muscle skeletal.  He also endorsed weight loss but no night sweats or fever, denies any active cancer. Will obtain CT scan of lumbar spine.  Rectal exam unremarkable, no stool impaction. No red flags.  Care discussed with Dr. Vanita Panda.   5:22 PM Lumbar CT scan demonstrate T11, T12, L1, L2, and L5 compression fracture.  The T11 and L2 compression fracture appears to be acute or subacute.  Mild lumbar spondylosis without central canal narrowing.  This finding is consistent with patient's recurrent back pain worsening with movement.  No evidence concerning for cauda equina.  Family member is now at bedside.  I discussed the finding with patient and with family member.  Plan to provide TLSO brace for support, patient discharged home with pain patch as well as opiate medication for breakthrough pain.  Outpatient follow-up with neurosurgeon as needed.  Return precautions discussed.     Domenic Moras, PA-C 06/14/18 1948    Carmin Muskrat, MD 06/14/18 2322

## 2018-06-14 NOTE — ED Notes (Signed)
Patient transported to CT 

## 2018-06-14 NOTE — Discharge Instructions (Signed)
You have been diagnosed with having compression fracture of your T11, T12, L1, L2, and L5.  Please wear TLSO brace for support.  Take tylenol and using pain patch for relief.  Take vicodin when pain is severe.  Follow up with Neurosurgeon next week as needed for further care.

## 2018-06-14 NOTE — ED Triage Notes (Signed)
Trans[facility] x 1 month. Patient also reports constipation x 1 week.

## 2018-06-14 NOTE — ED Notes (Signed)
ED Provider at bedside. 

## 2018-06-18 DIAGNOSIS — L570 Actinic keratosis: Secondary | ICD-10-CM | POA: Diagnosis not present

## 2018-06-18 DIAGNOSIS — L821 Other seborrheic keratosis: Secondary | ICD-10-CM | POA: Diagnosis not present

## 2018-06-20 DIAGNOSIS — R197 Diarrhea, unspecified: Secondary | ICD-10-CM | POA: Diagnosis not present

## 2018-06-20 DIAGNOSIS — R195 Other fecal abnormalities: Secondary | ICD-10-CM | POA: Diagnosis not present

## 2018-06-20 DIAGNOSIS — R319 Hematuria, unspecified: Secondary | ICD-10-CM | POA: Diagnosis not present

## 2018-06-20 DIAGNOSIS — R5381 Other malaise: Secondary | ICD-10-CM | POA: Diagnosis not present

## 2018-06-20 DIAGNOSIS — E871 Hypo-osmolality and hyponatremia: Secondary | ICD-10-CM | POA: Diagnosis not present

## 2018-07-08 DIAGNOSIS — Z23 Encounter for immunization: Secondary | ICD-10-CM | POA: Diagnosis not present

## 2018-07-23 DIAGNOSIS — H02401 Unspecified ptosis of right eyelid: Secondary | ICD-10-CM | POA: Diagnosis not present

## 2018-08-19 ENCOUNTER — Encounter: Payer: Self-pay | Admitting: Endocrinology

## 2018-08-19 ENCOUNTER — Ambulatory Visit (INDEPENDENT_AMBULATORY_CARE_PROVIDER_SITE_OTHER): Payer: Medicare Other | Admitting: Endocrinology

## 2018-08-19 VITALS — BP 110/70 | HR 59 | Ht 65.0 in | Wt 133.4 lb

## 2018-08-19 DIAGNOSIS — E871 Hypo-osmolality and hyponatremia: Secondary | ICD-10-CM | POA: Diagnosis not present

## 2018-08-19 LAB — BASIC METABOLIC PANEL
BUN: 17 mg/dL (ref 6–23)
CO2: 29 mEq/L (ref 19–32)
Calcium: 9.1 mg/dL (ref 8.4–10.5)
Chloride: 98 mEq/L (ref 96–112)
Creatinine, Ser: 0.92 mg/dL (ref 0.40–1.50)
GFR: 82.36 mL/min (ref >60.00)
Glucose, Bld: 97 mg/dL (ref 70–99)
Potassium: 4.4 mEq/L (ref 3.5–5.1)
Sodium: 132 mEq/L — ABNORMAL LOW (ref 135–145)

## 2018-08-19 LAB — TSH: TSH: 1.26 u[IU]/mL (ref 0.35–4.50)

## 2018-08-19 MED ORDER — SODIUM CHLORIDE 1 G PO TABS: 4.0000 g | ORAL_TABLET | Freq: Every day | ORAL | 3 refills | Status: DC

## 2018-08-19 NOTE — Patient Instructions (Addendum)
Blood tests are requested for you today.  We'll let you know about the results.  You do not need to limit salt intake, so you should eat a "liberal salt" diet.   Please continue to limit how much fluid you drink (maximum 2 quarts per day).   Please ask Dr Rex Kras about your symptoms.   Please come back for a follow-up appointment in 3 months.

## 2018-08-19 NOTE — Progress Notes (Signed)
Subjective:    Patient ID: Adam Cota., male    DOB: May 21, 1928, 84 y.o.   MRN: [id number]  HPI Pt returns for f/u of hyponatremia (dx'ed 2012; no secondary cause was found; VP level was undetectable, and urine osm was 484, so nephogenic cause is presumed; CT showed no evidence of hydronephrosis or urinary calculi; he was rx'ed [facility] which [facility]; NaCl was added in 2019).  He has slight increase in chronic leg swelling, but no assoc sob Past Medical History:  Diagnosis Date  . Anxiety   . Colon polyp    hyperplastic  . Diverticulosis of colon (without mention of hemorrhage)   . Esophageal stricture   . GERD (gastroesophageal reflux disease)   . Hiatal hernia   . Lower extremity weakness   . OCD (obsessive compulsive disorder)   . Viral hepatitis 08/1983    Past Surgical History:  Procedure Laterality Date  . COLONOSCOPY    . [place]    Social History   Socioeconomic History  . Marital status: Married    Spouse name: Not on file  . Number of children: 2  . Years of education: HS  . Highest education level: Not on file  Occupational History  . Occupation: Retired  Scientific laboratory technician  . Financial resource strain: Not on file  . Food insecurity:    Worry: Not on file    Inability: Not on file  . Transportation needs:    Medical: Not on file    Non-medical: Not on file  Tobacco Use  . Smoking status: Never Smoker  . Smokeless tobacco: Never Used  Substance and Sexual Activity  . Alcohol use: Yes    Alcohol/week: 7.0 standard drinks    Types: 7 Standard drinks or equivalent per week    Comment: occasional  . Drug use: No  . Sexual activity: Not on file  Lifestyle  . Physical activity:    Days per week: Not on file    Minutes per session: Not on file  . Stress: Not on file  Relationships  . Social connections:    Talks on phone: Not on file    Gets together: Not on file    Attends religious service: Not on file    Active  member of club or organization: Not on file    Attends meetings of clubs or organizations: Not on file    Relationship status: Not on file  . Intimate partner violence:    Fear of current or ex partner: Not on file    Emotionally abused: Not on file    Physically abused: Not on file    Forced sexual activity: Not on file  Other Topics Concern  . Not on file  Social History Narrative   Lives at home alone.   Right-handed.   No caffeine use.    Current Outpatient Medications on File Prior to Visit  Medication Sig Dispense Refill  . demeclocycline (DECLOMYCIN) 150 MG tablet Take 150 mg by mouth 2 (two) times daily.    Marland Kitchen HYDROcodone-acetaminophen (NORCO/VICODIN) 5-325 MG tablet Take 1 tablet by mouth every 6 (six) hours as needed for moderate pain or severe pain. 15 tablet 0  . LORazepam (ATIVAN) 1 MG tablet Take 1-2 tablets (1-2 mg total) by mouth 2 (two) times daily. Take 1 tablet at noon and 2 tablets at bedtime (Patient taking differently: Take 2 mg by mouth 2 (two) times daily. Take 2 tablets  at bedtime) 30 tablet 0  . Multiple Vitamins-Minerals (PRESERVISION AREDS) CAPS Take 1 capsule by mouth 2 (two) times daily.    . polyethylene glycol (MIRALAX / GLYCOLAX) packet Take 17 g by mouth daily.      . sodium chloride (OCEAN) 0.65 % SOLN nasal spray Place 1 spray into both nostrils 3 (three) times daily as needed for congestion.    . tamsulosin (FLOMAX) 0.4 MG CAPS capsule Take 0.4 mg by mouth at bedtime.     No current facility-administered medications on file prior to visit.     Allergies  Allergen Reactions  . Bee Venom Anaphylaxis    Other reaction(s): Other (See Comments) Loss of consciousness  . Nsaids     Other reaction(s): intol  . Other Other (See Comments)    Family History  Problem Relation Age of Onset  . Diverticulosis Mother   . Melanoma Mother   . Lung cancer Father   . Colon cancer Other        1st cousion.  . Allergic rhinitis Neg Hx   . Angioedema Neg Hx    . Asthma Neg Hx   . Eczema Neg Hx   . Immunodeficiency Neg Hx   . Urticaria Neg Hx     BP 110/70 (BP Location: Left Arm, Patient Position: Sitting, Cuff Size: Normal)   Pulse (!) 59   Ht 5\' 5"  (1.651 m)   Wt 133 lb 6.4 oz (60.5 kg)   SpO2 94%   BMI 22.20 kg/m   Review of Systems He has lost a few lbs.  Denies decreased appetite.    Objective:   Physical Exam VITAL SIGNS:  See vs page. GENERAL: no distress. Ext: 1+ bilat leg edema.    Lab Results  Component Value Date   CREATININE 0.92 08/19/2018   BUN 17 08/19/2018   NA 132 (L) 08/19/2018   K 4.4 08/19/2018   CL 98 08/19/2018   CO2 29 08/19/2018      Assessment & Plan:  Hyponatremia: worse.  I have sent a prescription to your pharmacy, to increase NaCl tabs Edema: worse: add lasix  Patient Instructions  Blood tests are requested for you today.  We'll let you know about the results.  You do not need to limit salt intake, so you should eat a "liberal salt" diet.   Please continue to limit how much fluid you drink (maximum 2 quarts per day).   Please ask Dr Rex Kras about your symptoms.   Please come back for a follow-up appointment in 3 months.

## 2018-08-20 ENCOUNTER — Telehealth: Payer: Self-pay | Admitting: Endocrinology

## 2018-08-20 NOTE — Telephone Encounter (Signed)
Patient stated that he wants all future lab results sent to him in the mail. He would also like the labs from yesterday sent out to him also.    Patient also has some questions about his medications and what he should be taking.  (salt tablets)    Please advise Patient stated that a message could be left on the voicemail

## 2018-08-20 NOTE — Telephone Encounter (Signed)
According to my conversation with patient and as documented, this has already been taken care of.

## 2018-08-20 NOTE — Telephone Encounter (Signed)
Patient called and requested to put the results in the mail.

## 2018-08-20 NOTE — Telephone Encounter (Signed)
Letter has already been mailed with detailed instructions re: new dosage of Sodium Chloride. Please be aware of pt request. Detailed message has also been left on pt VM at his request

## 2018-08-21 ENCOUNTER — Telehealth: Payer: Self-pay | Admitting: Endocrinology

## 2018-08-21 MED ORDER — FUROSEMIDE 20 MG PO TABS: 20.0000 mg | ORAL_TABLET | Freq: Every day | ORAL | 3 refills | Status: DC

## 2018-08-21 NOTE — Telephone Encounter (Signed)
please call patient: In view of your leg swelling and increasing salt tabs, I have sent a prescription to your pharmacy, to also add a fluid pill.  I'll see you next time.

## 2018-08-22 ENCOUNTER — Other Ambulatory Visit: Payer: Self-pay

## 2018-08-22 DIAGNOSIS — E871 Hypo-osmolality and hyponatremia: Secondary | ICD-10-CM

## 2018-08-22 MED ORDER — FUROSEMIDE 20 MG PO TABS: 20.0000 mg | ORAL_TABLET | Freq: Every day | ORAL | 3 refills | Status: DC

## 2018-08-22 NOTE — Telephone Encounter (Signed)
Called pt and made him aware. Verbalized acceptance and understanding. 

## 2018-08-26 ENCOUNTER — Telehealth: Payer: Self-pay | Admitting: Endocrinology

## 2018-08-26 NOTE — Telephone Encounter (Signed)
Patient is requesting a letter in the mail with what medications he needs to be taking. Please Advise, thanks

## 2018-08-26 NOTE — Telephone Encounter (Signed)
Please advise 

## 2018-08-26 NOTE — Telephone Encounter (Signed)
A letter has been generated and placed on Dr. Cordelia Pen desk for his review and signature. Will mail once signed.

## 2018-08-26 NOTE — Telephone Encounter (Signed)
Patient states he on 4 salt tablets a day and is also taking demeclocycline 2 times a day. Does patient still take demeclocycline 2 times a day along with the salt tablets? Patient increased demeclocycine to 4 tablets. Please Advise, thanks You can leave a VM.

## 2018-08-26 NOTE — Telephone Encounter (Signed)
Called pt and informed of Dr. Ellison's orders. Verbalized acceptance and understanding. 

## 2018-08-26 NOTE — Telephone Encounter (Signed)
Please take the demeclocycline 1 pill, twice a day

## 2018-09-15 DIAGNOSIS — N3941 Urge incontinence: Secondary | ICD-10-CM | POA: Diagnosis not present

## 2018-09-15 DIAGNOSIS — N401 Enlarged prostate with lower urinary tract symptoms: Secondary | ICD-10-CM | POA: Diagnosis not present

## 2018-09-15 DIAGNOSIS — R351 Nocturia: Secondary | ICD-10-CM | POA: Diagnosis not present

## 2018-09-22 DIAGNOSIS — F339 Major depressive disorder, recurrent, unspecified: Secondary | ICD-10-CM | POA: Diagnosis not present

## 2018-10-07 DIAGNOSIS — R531 Weakness: Secondary | ICD-10-CM | POA: Diagnosis not present

## 2018-10-07 DIAGNOSIS — R2689 Other abnormalities of gait and mobility: Secondary | ICD-10-CM | POA: Diagnosis not present

## 2018-10-08 DIAGNOSIS — R2689 Other abnormalities of gait and mobility: Secondary | ICD-10-CM | POA: Diagnosis not present

## 2018-10-08 DIAGNOSIS — R531 Weakness: Secondary | ICD-10-CM | POA: Diagnosis not present

## 2018-10-09 DIAGNOSIS — R2689 Other abnormalities of gait and mobility: Secondary | ICD-10-CM | POA: Diagnosis not present

## 2018-10-09 DIAGNOSIS — R531 Weakness: Secondary | ICD-10-CM | POA: Diagnosis not present

## 2018-10-14 DIAGNOSIS — R531 Weakness: Secondary | ICD-10-CM | POA: Diagnosis not present

## 2018-10-14 DIAGNOSIS — R2689 Other abnormalities of gait and mobility: Secondary | ICD-10-CM | POA: Diagnosis not present

## 2018-10-15 DIAGNOSIS — R2689 Other abnormalities of gait and mobility: Secondary | ICD-10-CM | POA: Diagnosis not present

## 2018-10-15 DIAGNOSIS — R531 Weakness: Secondary | ICD-10-CM | POA: Diagnosis not present

## 2018-10-16 DIAGNOSIS — R531 Weakness: Secondary | ICD-10-CM | POA: Diagnosis not present

## 2018-10-16 DIAGNOSIS — R2689 Other abnormalities of gait and mobility: Secondary | ICD-10-CM | POA: Diagnosis not present

## 2018-10-21 DIAGNOSIS — R531 Weakness: Secondary | ICD-10-CM | POA: Diagnosis not present

## 2018-10-21 DIAGNOSIS — R2689 Other abnormalities of gait and mobility: Secondary | ICD-10-CM | POA: Diagnosis not present

## 2018-10-22 DIAGNOSIS — R2689 Other abnormalities of gait and mobility: Secondary | ICD-10-CM | POA: Diagnosis not present

## 2018-10-22 DIAGNOSIS — R531 Weakness: Secondary | ICD-10-CM | POA: Diagnosis not present

## 2018-10-28 DIAGNOSIS — R531 Weakness: Secondary | ICD-10-CM | POA: Diagnosis not present

## 2018-10-28 DIAGNOSIS — R2689 Other abnormalities of gait and mobility: Secondary | ICD-10-CM | POA: Diagnosis not present

## 2018-10-29 DIAGNOSIS — R2689 Other abnormalities of gait and mobility: Secondary | ICD-10-CM | POA: Diagnosis not present

## 2018-10-29 DIAGNOSIS — R531 Weakness: Secondary | ICD-10-CM | POA: Diagnosis not present

## 2018-10-30 DIAGNOSIS — R2689 Other abnormalities of gait and mobility: Secondary | ICD-10-CM | POA: Diagnosis not present

## 2018-10-30 DIAGNOSIS — R531 Weakness: Secondary | ICD-10-CM | POA: Diagnosis not present

## 2018-11-04 DIAGNOSIS — R2689 Other abnormalities of gait and mobility: Secondary | ICD-10-CM | POA: Diagnosis not present

## 2018-11-04 DIAGNOSIS — R531 Weakness: Secondary | ICD-10-CM | POA: Diagnosis not present

## 2018-11-05 DIAGNOSIS — R2689 Other abnormalities of gait and mobility: Secondary | ICD-10-CM | POA: Diagnosis not present

## 2018-11-05 DIAGNOSIS — R531 Weakness: Secondary | ICD-10-CM | POA: Diagnosis not present

## 2018-11-06 DIAGNOSIS — R2689 Other abnormalities of gait and mobility: Secondary | ICD-10-CM | POA: Diagnosis not present

## 2018-11-06 DIAGNOSIS — R531 Weakness: Secondary | ICD-10-CM | POA: Diagnosis not present

## 2018-11-13 DIAGNOSIS — M7989 Other specified soft tissue disorders: Secondary | ICD-10-CM | POA: Diagnosis not present

## 2018-11-13 DIAGNOSIS — H6123 Impacted cerumen, bilateral: Secondary | ICD-10-CM | POA: Diagnosis not present

## 2018-12-11 ENCOUNTER — Ambulatory Visit: Payer: BLUE CROSS/BLUE SHIELD | Admitting: Endocrinology

## 2018-12-11 ENCOUNTER — Telehealth: Payer: Self-pay

## 2018-12-11 NOTE — Telephone Encounter (Signed)
Patient called today with questions on medication he is taking-he wants to know if he is supposed to be taking sodium chloride 4 tablets daily since the [place] also gave him demeclocycline 2 tablets daily to take for low sodium as well please advise

## 2018-12-11 NOTE — Telephone Encounter (Signed)
Yes, please take both

## 2018-12-12 NOTE — Telephone Encounter (Signed)
I have made patient aware that he is to take both medications

## 2019-01-14 ENCOUNTER — Other Ambulatory Visit: Payer: Self-pay

## 2019-01-14 ENCOUNTER — Ambulatory Visit (INDEPENDENT_AMBULATORY_CARE_PROVIDER_SITE_OTHER): Payer: Medicare Other | Admitting: Endocrinology

## 2019-01-14 ENCOUNTER — Encounter: Payer: Self-pay | Admitting: Endocrinology

## 2019-01-14 DIAGNOSIS — E871 Hypo-osmolality and hyponatremia: Secondary | ICD-10-CM | POA: Diagnosis not present

## 2019-01-14 NOTE — Progress Notes (Signed)
Subjective:    Patient ID: Adam Cota., male    DOB: [birth year], 85 y.o.   MRN: [id number]  HPI  telehealth visit today via phone x 6 minutes Alternatives to telehealth are presented to this patient, and the patient agrees to the telehealth visit. Pt is advised of the cost of the visit, and agrees to this, also.   Patient is at home, and I am at the office.   Persons attending the telehealth visit: the patient and I Pt returns for f/u of hyponatremia (dx'ed 2012; no secondary cause was found; VP level was undetectable, and urine osm was 484, so nephogenic cause is presumed; CT showed no evidence of hydronephrosis or urinary calculi; he was rx'ed declomycin, which the [place] pays for; NaCl was added in 2019).  pt states he feels well in general, except for fatigue.  Past Medical History:  Diagnosis Date  . Anxiety   . Colon polyp    hyperplastic  . Diverticulosis of colon (without mention of hemorrhage)   . Esophageal stricture   . GERD (gastroesophageal reflux disease)   . Hiatal hernia   . Lower extremity weakness   . OCD (obsessive compulsive disorder)   . Viral hepatitis 08/1983    Past Surgical History:  Procedure Laterality Date  . COLONOSCOPY    . [place]    Social History   Socioeconomic History  . Marital status: Married    Spouse name: Not on file  . Number of children: 2  . Years of education: HS  . Highest education level: Not on file  Occupational History  . Occupation: Retired  Scientific laboratory technician  . Financial resource strain: Not on file  . Food insecurity:    Worry: Not on file    Inability: Not on file  . Transportation needs:    Medical: Not on file    Non-medical: Not on file  Tobacco Use  . Smoking status: Never Smoker  . Smokeless tobacco: Never Used  Substance and Sexual Activity  . Alcohol use: Yes    Alcohol/week: 7.0 standard drinks    Types: 7 Standard drinks or equivalent per week    Comment: occasional  . Drug use:  No  . Sexual activity: Not on file  Lifestyle  . Physical activity:    Days per week: Not on file    Minutes per session: Not on file  . Stress: Not on file  Relationships  . Social connections:    Talks on phone: Not on file    Gets together: Not on file    Attends religious service: Not on file    Active member of club or organization: Not on file    Attends meetings of clubs or organizations: Not on file    Relationship status: Not on file  . Intimate partner violence:    Fear of current or ex partner: Not on file    Emotionally abused: Not on file    Physically abused: Not on file    Forced sexual activity: Not on file  Other Topics Concern  . Not on file  Social History Narrative   Lives at home alone.   Right-handed.   No caffeine use.    Current Outpatient Medications on File Prior to Visit  Medication Sig Dispense Refill  . demeclocycline (DECLOMYCIN) 150 MG tablet Take 150 mg by mouth 2 (two) times daily.    . furosemide (LASIX) 20 MG tablet Take 1 tablet (  20 mg total) by mouth daily. 90 tablet 3  . HYDROcodone-acetaminophen (NORCO/VICODIN) 5-325 MG tablet Take 1 tablet by mouth every 6 (six) hours as needed for moderate pain or severe pain. 15 tablet 0  . LORazepam (ATIVAN) 1 MG tablet Take 1-2 tablets (1-2 mg total) by mouth 2 (two) times daily. Take 1 tablet at noon and 2 tablets at bedtime (Patient taking differently: Take 2 mg by mouth 2 (two) times daily. Take 2 tablets at bedtime) 30 tablet 0  . Multiple Vitamins-Minerals (PRESERVISION AREDS) CAPS Take 1 capsule by mouth 2 (two) times daily.    . polyethylene glycol (MIRALAX / GLYCOLAX) packet Take 17 g by mouth daily.      . sodium chloride (OCEAN) 0.65 % SOLN nasal spray Place 1 spray into both nostrils 3 (three) times daily as needed for congestion.    . sodium chloride 1 g tablet Take 4 tablets (4 g total) by mouth daily. 360 tablet 3  . tamsulosin (FLOMAX) 0.4 MG CAPS capsule Take 0.4 mg by mouth at bedtime.      No current facility-administered medications on file prior to visit.     Allergies  Allergen Reactions  . Bee Venom Anaphylaxis    Other reaction(s): Other (See Comments) Loss of consciousness  . Nsaids     Other reaction(s): intol  . Other Other (See Comments)    Family History  Problem Relation Age of Onset  . Diverticulosis Mother   . Melanoma Mother   . Lung cancer Father   . Colon cancer Other        1st cousion.  . Allergic rhinitis Neg Hx   . Angioedema Neg Hx   . Asthma Neg Hx   . Eczema Neg Hx   . Immunodeficiency Neg Hx   . Urticaria Neg Hx      Review of Systems Denies headache and dizziness.      Objective:   Physical Exam      Assessment & Plan:  Hyponatremia: we discussed: he declines labs for now.  Patient Instructions  Please continue the same medications Please come back for a follow-up appointment in 1 month.

## 2019-01-14 NOTE — Patient Instructions (Signed)
Please continue the same medications Please come back for a follow-up appointment in 1 month.

## 2019-01-27 DIAGNOSIS — M1990 Unspecified osteoarthritis, unspecified site: Secondary | ICD-10-CM | POA: Diagnosis not present

## 2019-01-27 DIAGNOSIS — E871 Hypo-osmolality and hyponatremia: Secondary | ICD-10-CM | POA: Diagnosis not present

## 2019-01-27 DIAGNOSIS — M549 Dorsalgia, unspecified: Secondary | ICD-10-CM | POA: Diagnosis not present

## 2019-01-27 DIAGNOSIS — F411 Generalized anxiety disorder: Secondary | ICD-10-CM | POA: Diagnosis not present

## 2019-01-27 DIAGNOSIS — K219 Gastro-esophageal reflux disease without esophagitis: Secondary | ICD-10-CM | POA: Diagnosis not present

## 2019-01-29 DIAGNOSIS — R634 Abnormal weight loss: Secondary | ICD-10-CM | POA: Diagnosis not present

## 2019-02-03 DIAGNOSIS — E039 Hypothyroidism, unspecified: Secondary | ICD-10-CM | POA: Diagnosis not present

## 2019-02-03 DIAGNOSIS — D649 Anemia, unspecified: Secondary | ICD-10-CM | POA: Diagnosis not present

## 2019-02-03 DIAGNOSIS — R634 Abnormal weight loss: Secondary | ICD-10-CM | POA: Diagnosis not present

## 2019-02-13 DIAGNOSIS — F339 Major depressive disorder, recurrent, unspecified: Secondary | ICD-10-CM | POA: Diagnosis not present

## 2019-03-09 ENCOUNTER — Other Ambulatory Visit: Payer: Self-pay

## 2019-03-09 ENCOUNTER — Telehealth: Payer: Self-pay | Admitting: Endocrinology

## 2019-03-09 DIAGNOSIS — E871 Hypo-osmolality and hyponatremia: Secondary | ICD-10-CM

## 2019-03-09 MED ORDER — DEMECLOCYCLINE HCL 150 MG PO TABS: 150.0000 mg | ORAL_TABLET | Freq: Two times a day (BID) | ORAL | 2 refills | Status: DC

## 2019-03-09 NOTE — Telephone Encounter (Signed)
Please advise if you wish to manage refill of this medication

## 2019-03-09 NOTE — Telephone Encounter (Signed)
Pt was getting the declomycin from [place] but does not want to deal with them anymore can we please call this into [facility] on [address]

## 2019-03-09 NOTE — Telephone Encounter (Signed)
ammie please call him when this is done

## 2019-03-09 NOTE — Telephone Encounter (Signed)
demeclocycline (DECLOMYCIN) 150 MG tablet 180 tablet 2 03/09/2019    Sig - Route: Take 1 tablet (150 mg total) by mouth 2 (two) times daily. - Oral   Sent to pharmacy as: demeclocycline (DECLOMYCIN) 150 MG tablet   E-Prescribing Status: Receipt confirmed by pharmacy (03/09/2019 12:24 PM EDT)    Rx refilled as ordered. LVM requesting returned call.

## 2019-03-09 NOTE — Telephone Encounter (Signed)
Yes, please refill prn

## 2019-03-11 DIAGNOSIS — M6281 Muscle weakness (generalized): Secondary | ICD-10-CM | POA: Diagnosis not present

## 2019-03-11 DIAGNOSIS — Z9181 History of falling: Secondary | ICD-10-CM | POA: Diagnosis not present

## 2019-03-11 DIAGNOSIS — R2689 Other abnormalities of gait and mobility: Secondary | ICD-10-CM | POA: Diagnosis not present

## 2019-03-11 DIAGNOSIS — M150 Primary generalized (osteo)arthritis: Secondary | ICD-10-CM | POA: Diagnosis not present

## 2019-03-20 DIAGNOSIS — M150 Primary generalized (osteo)arthritis: Secondary | ICD-10-CM | POA: Diagnosis not present

## 2019-03-20 DIAGNOSIS — R2689 Other abnormalities of gait and mobility: Secondary | ICD-10-CM | POA: Diagnosis not present

## 2019-03-20 DIAGNOSIS — M6281 Muscle weakness (generalized): Secondary | ICD-10-CM | POA: Diagnosis not present

## 2019-03-20 DIAGNOSIS — Z9181 History of falling: Secondary | ICD-10-CM | POA: Diagnosis not present

## 2019-03-23 DIAGNOSIS — Z9181 History of falling: Secondary | ICD-10-CM | POA: Diagnosis not present

## 2019-03-23 DIAGNOSIS — M150 Primary generalized (osteo)arthritis: Secondary | ICD-10-CM | POA: Diagnosis not present

## 2019-03-23 DIAGNOSIS — R2689 Other abnormalities of gait and mobility: Secondary | ICD-10-CM | POA: Diagnosis not present

## 2019-03-23 DIAGNOSIS — M6281 Muscle weakness (generalized): Secondary | ICD-10-CM | POA: Diagnosis not present

## 2019-03-25 DIAGNOSIS — Z9181 History of falling: Secondary | ICD-10-CM | POA: Diagnosis not present

## 2019-03-25 DIAGNOSIS — R2689 Other abnormalities of gait and mobility: Secondary | ICD-10-CM | POA: Diagnosis not present

## 2019-03-25 DIAGNOSIS — M150 Primary generalized (osteo)arthritis: Secondary | ICD-10-CM | POA: Diagnosis not present

## 2019-03-25 DIAGNOSIS — M6281 Muscle weakness (generalized): Secondary | ICD-10-CM | POA: Diagnosis not present

## 2019-03-26 DIAGNOSIS — M150 Primary generalized (osteo)arthritis: Secondary | ICD-10-CM | POA: Diagnosis not present

## 2019-03-26 DIAGNOSIS — M6281 Muscle weakness (generalized): Secondary | ICD-10-CM | POA: Diagnosis not present

## 2019-03-26 DIAGNOSIS — Z9181 History of falling: Secondary | ICD-10-CM | POA: Diagnosis not present

## 2019-03-26 DIAGNOSIS — R2689 Other abnormalities of gait and mobility: Secondary | ICD-10-CM | POA: Diagnosis not present

## 2019-03-27 DIAGNOSIS — M150 Primary generalized (osteo)arthritis: Secondary | ICD-10-CM | POA: Diagnosis not present

## 2019-03-27 DIAGNOSIS — Z9181 History of falling: Secondary | ICD-10-CM | POA: Diagnosis not present

## 2019-03-27 DIAGNOSIS — R2689 Other abnormalities of gait and mobility: Secondary | ICD-10-CM | POA: Diagnosis not present

## 2019-03-27 DIAGNOSIS — M6281 Muscle weakness (generalized): Secondary | ICD-10-CM | POA: Diagnosis not present

## 2019-03-30 DIAGNOSIS — Z9181 History of falling: Secondary | ICD-10-CM | POA: Diagnosis not present

## 2019-03-30 DIAGNOSIS — R2689 Other abnormalities of gait and mobility: Secondary | ICD-10-CM | POA: Diagnosis not present

## 2019-03-30 DIAGNOSIS — M150 Primary generalized (osteo)arthritis: Secondary | ICD-10-CM | POA: Diagnosis not present

## 2019-03-30 DIAGNOSIS — M6281 Muscle weakness (generalized): Secondary | ICD-10-CM | POA: Diagnosis not present

## 2019-03-31 DIAGNOSIS — R2689 Other abnormalities of gait and mobility: Secondary | ICD-10-CM | POA: Diagnosis not present

## 2019-03-31 DIAGNOSIS — M6281 Muscle weakness (generalized): Secondary | ICD-10-CM | POA: Diagnosis not present

## 2019-03-31 DIAGNOSIS — Z9181 History of falling: Secondary | ICD-10-CM | POA: Diagnosis not present

## 2019-03-31 DIAGNOSIS — M150 Primary generalized (osteo)arthritis: Secondary | ICD-10-CM | POA: Diagnosis not present

## 2019-04-01 DIAGNOSIS — M6281 Muscle weakness (generalized): Secondary | ICD-10-CM | POA: Diagnosis not present

## 2019-04-01 DIAGNOSIS — M150 Primary generalized (osteo)arthritis: Secondary | ICD-10-CM | POA: Diagnosis not present

## 2019-04-01 DIAGNOSIS — R2689 Other abnormalities of gait and mobility: Secondary | ICD-10-CM | POA: Diagnosis not present

## 2019-04-01 DIAGNOSIS — Z9181 History of falling: Secondary | ICD-10-CM | POA: Diagnosis not present

## 2019-04-02 DIAGNOSIS — R2689 Other abnormalities of gait and mobility: Secondary | ICD-10-CM | POA: Diagnosis not present

## 2019-04-02 DIAGNOSIS — M150 Primary generalized (osteo)arthritis: Secondary | ICD-10-CM | POA: Diagnosis not present

## 2019-04-02 DIAGNOSIS — M6281 Muscle weakness (generalized): Secondary | ICD-10-CM | POA: Diagnosis not present

## 2019-04-02 DIAGNOSIS — Z9181 History of falling: Secondary | ICD-10-CM | POA: Diagnosis not present

## 2019-04-06 DIAGNOSIS — Z9181 History of falling: Secondary | ICD-10-CM | POA: Diagnosis not present

## 2019-04-06 DIAGNOSIS — R2689 Other abnormalities of gait and mobility: Secondary | ICD-10-CM | POA: Diagnosis not present

## 2019-04-06 DIAGNOSIS — M6281 Muscle weakness (generalized): Secondary | ICD-10-CM | POA: Diagnosis not present

## 2019-04-06 DIAGNOSIS — M150 Primary generalized (osteo)arthritis: Secondary | ICD-10-CM | POA: Diagnosis not present

## 2019-04-07 DIAGNOSIS — R2689 Other abnormalities of gait and mobility: Secondary | ICD-10-CM | POA: Diagnosis not present

## 2019-04-07 DIAGNOSIS — M150 Primary generalized (osteo)arthritis: Secondary | ICD-10-CM | POA: Diagnosis not present

## 2019-04-07 DIAGNOSIS — M6281 Muscle weakness (generalized): Secondary | ICD-10-CM | POA: Diagnosis not present

## 2019-04-07 DIAGNOSIS — Z9181 History of falling: Secondary | ICD-10-CM | POA: Diagnosis not present

## 2019-04-08 DIAGNOSIS — Z9181 History of falling: Secondary | ICD-10-CM | POA: Diagnosis not present

## 2019-04-08 DIAGNOSIS — R2689 Other abnormalities of gait and mobility: Secondary | ICD-10-CM | POA: Diagnosis not present

## 2019-04-08 DIAGNOSIS — M6281 Muscle weakness (generalized): Secondary | ICD-10-CM | POA: Diagnosis not present

## 2019-04-08 DIAGNOSIS — M150 Primary generalized (osteo)arthritis: Secondary | ICD-10-CM | POA: Diagnosis not present

## 2019-04-09 DIAGNOSIS — M6281 Muscle weakness (generalized): Secondary | ICD-10-CM | POA: Diagnosis not present

## 2019-04-09 DIAGNOSIS — M150 Primary generalized (osteo)arthritis: Secondary | ICD-10-CM | POA: Diagnosis not present

## 2019-04-09 DIAGNOSIS — R2689 Other abnormalities of gait and mobility: Secondary | ICD-10-CM | POA: Diagnosis not present

## 2019-04-09 DIAGNOSIS — Z9181 History of falling: Secondary | ICD-10-CM | POA: Diagnosis not present

## 2019-05-01 DIAGNOSIS — R2689 Other abnormalities of gait and mobility: Secondary | ICD-10-CM | POA: Diagnosis not present

## 2019-05-01 DIAGNOSIS — M4000 Postural kyphosis, site unspecified: Secondary | ICD-10-CM | POA: Diagnosis not present

## 2019-05-01 DIAGNOSIS — R41841 Cognitive communication deficit: Secondary | ICD-10-CM | POA: Diagnosis not present

## 2019-05-01 DIAGNOSIS — M150 Primary generalized (osteo)arthritis: Secondary | ICD-10-CM | POA: Diagnosis not present

## 2019-05-04 DIAGNOSIS — R2689 Other abnormalities of gait and mobility: Secondary | ICD-10-CM | POA: Diagnosis not present

## 2019-05-04 DIAGNOSIS — M4000 Postural kyphosis, site unspecified: Secondary | ICD-10-CM | POA: Diagnosis not present

## 2019-05-04 DIAGNOSIS — R41841 Cognitive communication deficit: Secondary | ICD-10-CM | POA: Diagnosis not present

## 2019-05-04 DIAGNOSIS — M150 Primary generalized (osteo)arthritis: Secondary | ICD-10-CM | POA: Diagnosis not present

## 2019-05-06 DIAGNOSIS — M4000 Postural kyphosis, site unspecified: Secondary | ICD-10-CM | POA: Diagnosis not present

## 2019-05-06 DIAGNOSIS — R41841 Cognitive communication deficit: Secondary | ICD-10-CM | POA: Diagnosis not present

## 2019-05-06 DIAGNOSIS — R2689 Other abnormalities of gait and mobility: Secondary | ICD-10-CM | POA: Diagnosis not present

## 2019-05-06 DIAGNOSIS — M150 Primary generalized (osteo)arthritis: Secondary | ICD-10-CM | POA: Diagnosis not present

## 2019-05-08 DIAGNOSIS — R2689 Other abnormalities of gait and mobility: Secondary | ICD-10-CM | POA: Diagnosis not present

## 2019-05-08 DIAGNOSIS — R41841 Cognitive communication deficit: Secondary | ICD-10-CM | POA: Diagnosis not present

## 2019-05-08 DIAGNOSIS — M4000 Postural kyphosis, site unspecified: Secondary | ICD-10-CM | POA: Diagnosis not present

## 2019-05-08 DIAGNOSIS — M150 Primary generalized (osteo)arthritis: Secondary | ICD-10-CM | POA: Diagnosis not present

## 2019-05-11 DIAGNOSIS — M150 Primary generalized (osteo)arthritis: Secondary | ICD-10-CM | POA: Diagnosis not present

## 2019-05-11 DIAGNOSIS — M4000 Postural kyphosis, site unspecified: Secondary | ICD-10-CM | POA: Diagnosis not present

## 2019-05-11 DIAGNOSIS — R2689 Other abnormalities of gait and mobility: Secondary | ICD-10-CM | POA: Diagnosis not present

## 2019-05-11 DIAGNOSIS — R41841 Cognitive communication deficit: Secondary | ICD-10-CM | POA: Diagnosis not present

## 2019-05-27 DIAGNOSIS — M549 Dorsalgia, unspecified: Secondary | ICD-10-CM | POA: Diagnosis not present

## 2019-05-27 DIAGNOSIS — F411 Generalized anxiety disorder: Secondary | ICD-10-CM | POA: Diagnosis not present

## 2019-05-27 DIAGNOSIS — K219 Gastro-esophageal reflux disease without esophagitis: Secondary | ICD-10-CM | POA: Diagnosis not present

## 2019-05-27 DIAGNOSIS — M1990 Unspecified osteoarthritis, unspecified site: Secondary | ICD-10-CM | POA: Diagnosis not present

## 2019-05-27 DIAGNOSIS — E871 Hypo-osmolality and hyponatremia: Secondary | ICD-10-CM | POA: Diagnosis not present

## 2019-06-07 ENCOUNTER — Other Ambulatory Visit: Payer: Self-pay | Admitting: Endocrinology

## 2019-06-07 DIAGNOSIS — E871 Hypo-osmolality and hyponatremia: Secondary | ICD-10-CM

## 2019-06-23 DIAGNOSIS — Z23 Encounter for immunization: Secondary | ICD-10-CM | POA: Diagnosis not present

## 2019-07-26 DIAGNOSIS — M549 Dorsalgia, unspecified: Secondary | ICD-10-CM | POA: Diagnosis not present

## 2019-07-26 DIAGNOSIS — K219 Gastro-esophageal reflux disease without esophagitis: Secondary | ICD-10-CM | POA: Diagnosis not present

## 2019-07-26 DIAGNOSIS — E871 Hypo-osmolality and hyponatremia: Secondary | ICD-10-CM | POA: Diagnosis not present

## 2019-07-26 DIAGNOSIS — M1990 Unspecified osteoarthritis, unspecified site: Secondary | ICD-10-CM | POA: Diagnosis not present

## 2019-07-26 DIAGNOSIS — F411 Generalized anxiety disorder: Secondary | ICD-10-CM | POA: Diagnosis not present

## 2019-08-20 ENCOUNTER — Telehealth: Payer: Self-pay | Admitting: Endocrinology

## 2019-08-20 NOTE — Telephone Encounter (Signed)
Please advise 

## 2019-08-20 NOTE — Telephone Encounter (Signed)
1.  Please schedule f/u appt 2.  Then please refill x 1, pending that appt.  

## 2019-08-20 NOTE — Telephone Encounter (Signed)
Patient is sick-at a facility-Patient is scheduled for a MD to Patient appointment on 08/21/19 at 10:15 a.m.

## 2019-08-20 NOTE — Telephone Encounter (Signed)
Per Dr. Loanne Drilling, unable to refill Sodium Chloride without an appt. Routing this message to the front desk for scheduling purposes.

## 2019-08-21 ENCOUNTER — Encounter: Payer: Self-pay | Admitting: Endocrinology

## 2019-08-21 ENCOUNTER — Ambulatory Visit (INDEPENDENT_AMBULATORY_CARE_PROVIDER_SITE_OTHER): Payer: Medicare Other | Admitting: Endocrinology

## 2019-08-21 ENCOUNTER — Telehealth: Payer: Self-pay | Admitting: Endocrinology

## 2019-08-21 ENCOUNTER — Other Ambulatory Visit: Payer: Self-pay

## 2019-08-21 DIAGNOSIS — E871 Hypo-osmolality and hyponatremia: Secondary | ICD-10-CM | POA: Diagnosis not present

## 2019-08-21 MED ORDER — SODIUM CHLORIDE 1 G PO TABS: 4.0000 g | ORAL_TABLET | Freq: Every day | ORAL | 3 refills | Status: DC

## 2019-08-21 MED ORDER — DEMECLOCYCLINE HCL 150 MG PO TABS: 150.0000 mg | ORAL_TABLET | Freq: Two times a day (BID) | ORAL | 2 refills | Status: DC

## 2019-08-21 NOTE — Telephone Encounter (Signed)
Nurse called requesting labs for lab orders to be faxed over as dicussed in phone visit todat  Fax# [phone]

## 2019-08-21 NOTE — Telephone Encounter (Signed)
After speaking with nurse, she is stating this is not a sufficient order. Please place order and I will fax to her. Thank you

## 2019-08-21 NOTE — Telephone Encounter (Signed)
done

## 2019-08-21 NOTE — Telephone Encounter (Signed)
Chart Review Routing History Since 08/22/2018 (View Full Routing History)  Recipients Sent On Sent By Routed [place] Nurse     08/21/2019  3:30 PM Aleatha Borer, LPN BASIC METABOLIC PANEL ([id number])       [place] Nurse     08/21/2019  3:29 PM Aleatha Borer, LPN T4, FREE ([id number])       Home Health Nurse     08/21/2019  3:29 PM Aleatha Borer, LPN TSH ([id number])      Cover Page Message : Lab orders attached

## 2019-08-21 NOTE — Telephone Encounter (Signed)
The AVS serves as the request

## 2019-08-21 NOTE — Patient Instructions (Addendum)
Please check BMET and TSH   E87.1 To avoid falls, you should minimize lorazepam and alcohol.   Please come back for a follow-up appointment in 3 months.

## 2019-08-21 NOTE — Telephone Encounter (Signed)
Please advise. Not seeing any recent orders to fax

## 2019-08-21 NOTE — Progress Notes (Signed)
Subjective:    Patient ID: Adam Cota., male    DOB: November 08, 1927, 85 y.o.   MRN: [id number]  HPI  telehealth visit today via phone x 17 minutes Alternatives to telehealth are presented to this patient, and the patient agrees to the telehealth visit. Pt is advised of the cost of the visit, and agrees to this, also.   Patient is at home, and I am at the office.   Persons attending the telehealth visit: the patient, [place] independent living nurse and I.   Pt returns for f/u of hyponatremia (dx'ed 2012; no secondary cause was found; VP level was undetectable, and urine osm was 484, so nephogenic cause is presumed; CT showed no evidence of hydronephrosis or urinary calculi; he was rx'ed declomycin, which the [facility] pays for; NaCl was added in 2019).  pt states slight weakness throughout the body, and assoc fatigue.  He fell a few days ago.  No injury.   Past Medical History:  Diagnosis Date  . Anxiety   . Colon polyp    hyperplastic  . Diverticulosis of colon (without mention of hemorrhage)   . Esophageal stricture   . GERD (gastroesophageal reflux disease)   . Hiatal hernia   . Lower extremity weakness   . OCD (obsessive compulsive disorder)   . Viral hepatitis 08/1983    Past Surgical History:  Procedure Laterality Date  . COLONOSCOPY    . [place]    Social History   Socioeconomic History  . Marital status: Married    Spouse name: Not on file  . Number of children: 2  . Years of education: HS  . Highest education level: Not on file  Occupational History  . Occupation: Retired  Tobacco Use  . Smoking status: Never Smoker  . Smokeless tobacco: Never Used  Substance and Sexual Activity  . Alcohol use: Yes    Alcohol/week: 7.0 standard drinks    Types: 7 Standard drinks or equivalent per week    Comment: occasional  . Drug use: No  . Sexual activity: Not on file  Other Topics Concern  . Not on file  Social History Narrative   Lives at home  alone.   Right-handed.   No caffeine use.   Social Determinants of Health   Financial Resource Strain:   . Difficulty of Paying Living Expenses: Not on file  Food Insecurity:   . Worried About Charity fundraiser in the Last Year: Not on file  . Ran Out of Food in the Last Year: Not on file  Transportation Needs:   . Lack of Transportation (Medical): Not on file  . Lack of Transportation (Non-Medical): Not on file  Physical Activity:   . Days of Exercise per Week: Not on file  . Minutes of Exercise per Session: Not on file  Stress:   . Feeling of Stress : Not on file  Social Connections:   . Frequency of Communication with Friends and Family: Not on file  . Frequency of Social Gatherings with Friends and Family: Not on file  . Attends Religious Services: Not on file  . Active Member of Clubs or Organizations: Not on file  . Attends Archivist Meetings: Not on file  . Marital Status: Not on file  Intimate Partner Violence:   . Fear of Current or Ex-Partner: Not on file  . Emotionally Abused: Not on file  . Physically Abused: Not on file  . Sexually Abused:  Not on file    Current Outpatient Medications on File Prior to Visit  Medication Sig Dispense Refill  . furosemide (LASIX) 20 MG tablet TAKE 1 TABLET BY MOUTH EVERY DAY 90 tablet 3  . HYDROcodone-acetaminophen (NORCO/VICODIN) 5-325 MG tablet Take 1 tablet by mouth every 6 (six) hours as needed for moderate pain or severe pain. 15 tablet 0  . LORazepam (ATIVAN) 1 MG tablet Take 1-2 tablets (1-2 mg total) by mouth 2 (two) times daily. Take 1 tablet at noon and 2 tablets at bedtime (Patient taking differently: Take 2 mg by mouth 2 (two) times daily. Take 2 tablets at bedtime) 30 tablet 0  . Multiple Vitamins-Minerals (PRESERVISION AREDS) CAPS Take 1 capsule by mouth 2 (two) times daily.    . polyethylene glycol (MIRALAX / GLYCOLAX) packet Take 17 g by mouth daily.      . sodium chloride (OCEAN) 0.65 % SOLN nasal spray  Place 1 spray into both nostrils 3 (three) times daily as needed for congestion.    . tamsulosin (FLOMAX) 0.4 MG CAPS capsule Take 0.4 mg by mouth at bedtime.     No current facility-administered medications on file prior to visit.    Allergies  Allergen Reactions  . Bee Venom Anaphylaxis    Other reaction(s): Other (See Comments) Loss of consciousness  . Nsaids     Other reaction(s): intol  . Other Other (See Comments)    Family History  Problem Relation Age of Onset  . Diverticulosis Mother   . Melanoma Mother   . Lung cancer Father   . Colon cancer Other        1st cousion.  . Allergic rhinitis Neg Hx   . Angioedema Neg Hx   . Asthma Neg Hx   . Eczema Neg Hx   . Immunodeficiency Neg Hx   . Urticaria Neg Hx     There were no vitals taken for this visit.   Review of Systems Denies LOC and headache    Objective:   Physical Exam   Lab Results  Component Value Date   CREATININE 0.92 08/19/2018   BUN 17 08/19/2018   NA 132 (L) 08/19/2018   K 4.4 08/19/2018   CL 98 08/19/2018   CO2 29 08/19/2018       Assessment & Plan:  Fall, new Hyponatremia, due for recheck.  Patient Instructions  Please check BMET and TSH   E87.1 To avoid falls, you should minimize lorazepam and alcohol.   Please come back for a follow-up appointment in 3 months.

## 2019-08-25 ENCOUNTER — Encounter: Payer: Self-pay | Admitting: Endocrinology

## 2019-08-25 DIAGNOSIS — D649 Anemia, unspecified: Secondary | ICD-10-CM | POA: Diagnosis not present

## 2019-08-25 DIAGNOSIS — E039 Hypothyroidism, unspecified: Secondary | ICD-10-CM | POA: Diagnosis not present

## 2019-08-25 DIAGNOSIS — E871 Hypo-osmolality and hyponatremia: Secondary | ICD-10-CM | POA: Diagnosis not present

## 2019-08-27 ENCOUNTER — Telehealth: Payer: Self-pay | Admitting: Endocrinology

## 2019-08-27 NOTE — Telephone Encounter (Signed)
please contact patient: Normal.  Please continue the same medication I'll see you next time.

## 2019-08-27 NOTE — Telephone Encounter (Signed)
Chart Review Routing History Since 08/28/2018 (View Full Routing History)  Recipients Sent On Sent By Routed Reports   [place]     08/27/2019  2:08 PM Dorlisa Savino, LPN Letter on [id number] from Aleatha Borer, LPN

## 2019-09-15 ENCOUNTER — Other Ambulatory Visit: Payer: Self-pay | Admitting: Endocrinology

## 2019-09-21 DIAGNOSIS — R2689 Other abnormalities of gait and mobility: Secondary | ICD-10-CM | POA: Diagnosis not present

## 2019-09-21 DIAGNOSIS — R296 Repeated falls: Secondary | ICD-10-CM | POA: Diagnosis not present

## 2019-09-24 DIAGNOSIS — E871 Hypo-osmolality and hyponatremia: Secondary | ICD-10-CM | POA: Diagnosis not present

## 2019-09-24 DIAGNOSIS — M1990 Unspecified osteoarthritis, unspecified site: Secondary | ICD-10-CM | POA: Diagnosis not present

## 2019-09-24 DIAGNOSIS — K219 Gastro-esophageal reflux disease without esophagitis: Secondary | ICD-10-CM | POA: Diagnosis not present

## 2019-09-24 DIAGNOSIS — M549 Dorsalgia, unspecified: Secondary | ICD-10-CM | POA: Diagnosis not present

## 2019-09-24 DIAGNOSIS — F411 Generalized anxiety disorder: Secondary | ICD-10-CM | POA: Diagnosis not present

## 2019-09-28 DIAGNOSIS — R296 Repeated falls: Secondary | ICD-10-CM | POA: Diagnosis not present

## 2019-09-28 DIAGNOSIS — R2689 Other abnormalities of gait and mobility: Secondary | ICD-10-CM | POA: Diagnosis not present

## 2019-10-02 DIAGNOSIS — R2689 Other abnormalities of gait and mobility: Secondary | ICD-10-CM | POA: Diagnosis not present

## 2019-10-02 DIAGNOSIS — R296 Repeated falls: Secondary | ICD-10-CM | POA: Diagnosis not present

## 2019-10-05 DIAGNOSIS — R2689 Other abnormalities of gait and mobility: Secondary | ICD-10-CM | POA: Diagnosis not present

## 2019-10-05 DIAGNOSIS — R296 Repeated falls: Secondary | ICD-10-CM | POA: Diagnosis not present

## 2019-10-07 DIAGNOSIS — R2689 Other abnormalities of gait and mobility: Secondary | ICD-10-CM | POA: Diagnosis not present

## 2019-10-07 DIAGNOSIS — R296 Repeated falls: Secondary | ICD-10-CM | POA: Diagnosis not present

## 2019-10-09 DIAGNOSIS — R296 Repeated falls: Secondary | ICD-10-CM | POA: Diagnosis not present

## 2019-10-09 DIAGNOSIS — R2689 Other abnormalities of gait and mobility: Secondary | ICD-10-CM | POA: Diagnosis not present

## 2019-11-04 DIAGNOSIS — H6123 Impacted cerumen, bilateral: Secondary | ICD-10-CM | POA: Diagnosis not present

## 2019-11-04 DIAGNOSIS — H9190 Unspecified hearing loss, unspecified ear: Secondary | ICD-10-CM | POA: Diagnosis not present

## 2019-11-10 DIAGNOSIS — F339 Major depressive disorder, recurrent, unspecified: Secondary | ICD-10-CM | POA: Diagnosis not present

## 2019-11-23 DIAGNOSIS — K219 Gastro-esophageal reflux disease without esophagitis: Secondary | ICD-10-CM | POA: Diagnosis not present

## 2019-11-23 DIAGNOSIS — E871 Hypo-osmolality and hyponatremia: Secondary | ICD-10-CM | POA: Diagnosis not present

## 2019-11-23 DIAGNOSIS — M1990 Unspecified osteoarthritis, unspecified site: Secondary | ICD-10-CM | POA: Diagnosis not present

## 2019-11-23 DIAGNOSIS — F411 Generalized anxiety disorder: Secondary | ICD-10-CM | POA: Diagnosis not present

## 2019-11-23 DIAGNOSIS — M549 Dorsalgia, unspecified: Secondary | ICD-10-CM | POA: Diagnosis not present

## 2020-01-05 ENCOUNTER — Other Ambulatory Visit: Payer: Self-pay

## 2020-01-08 ENCOUNTER — Other Ambulatory Visit: Payer: Self-pay

## 2020-01-08 ENCOUNTER — Ambulatory Visit (INDEPENDENT_AMBULATORY_CARE_PROVIDER_SITE_OTHER): Payer: Medicare Other | Admitting: Endocrinology

## 2020-01-08 ENCOUNTER — Encounter: Payer: Self-pay | Admitting: Endocrinology

## 2020-01-08 VITALS — BP 110/70 | HR 73 | Ht 65.0 in | Wt 129.0 lb

## 2020-01-08 DIAGNOSIS — E871 Hypo-osmolality and hyponatremia: Secondary | ICD-10-CM

## 2020-01-08 LAB — BASIC METABOLIC PANEL
BUN: 17 mg/dL (ref 6–23)
CO2: 29 mEq/L (ref 19–32)
Calcium: 8.7 mg/dL (ref 8.4–10.5)
Chloride: 101 mEq/L (ref 96–112)
Creatinine, Ser: 0.99 mg/dL (ref 0.40–1.50)
GFR: 70.98 mL/min (ref >60.00)
Glucose, Bld: 121 mg/dL — ABNORMAL HIGH (ref 70–99)
Potassium: 4.4 mEq/L (ref 3.5–5.1)
Sodium: 134 mEq/L — ABNORMAL LOW (ref 135–145)

## 2020-01-08 LAB — TSH: TSH: 1.31 u[IU]/mL (ref 0.35–4.50)

## 2020-01-08 MED ORDER — FUROSEMIDE 40 MG PO TABS: 40.0000 mg | ORAL_TABLET | Freq: Every day | ORAL | 3 refills | Status: DC

## 2020-01-08 NOTE — Patient Instructions (Addendum)
Blood tests are requested for you today.  We'll let you know about the results.  you should continue to minimize lorazepam and alcohol, or avoid altogether.   Please come back for a follow-up appointment in 4 months.

## 2020-01-08 NOTE — Progress Notes (Signed)
Subjective:    Patient ID: Adam Cota., male    DOB: [birth year], 86 y.o.   MRN: [id number]  HPI Hx is from pt's son.  Pt returns for f/u of hyponatremia (dx'ed 2012; no secondary cause was found; VP level was undetectable, and urine osm was 484, so nephogenic cause is presumed; CT showed no evidence of hydronephrosis or urinary calculi; he was rx'ed declomycin, which the [facility] pays for; NaCl was added in 2019).  Main symptom is fatigue, worse later in the day.   Past Medical History:  Diagnosis Date  . Anxiety   . Colon polyp    hyperplastic  . Diverticulosis of colon (without mention of hemorrhage)   . Esophageal stricture   . GERD (gastroesophageal reflux disease)   . Hiatal hernia   . Lower extremity weakness   . OCD (obsessive compulsive disorder)   . Viral hepatitis 08/1983    Past Surgical History:  Procedure Laterality Date  . COLONOSCOPY    . [place]    Social History   Socioeconomic History  . Marital status: Married    Spouse name: Not on file  . Number of children: 2  . Years of education: HS  . Highest education level: Not on file  Occupational History  . Occupation: Retired  Tobacco Use  . Smoking status: Never Smoker  . Smokeless tobacco: Never Used  Substance and Sexual Activity  . Alcohol use: Yes    Alcohol/week: 7.0 standard drinks    Types: 7 Standard drinks or equivalent per week    Comment: occasional  . Drug use: No  . Sexual activity: Not on file  Other Topics Concern  . Not on file  Social History Narrative   Lives at home alone.   Right-handed.   No caffeine use.   Social Determinants of Health   Financial Resource Strain:   . Difficulty of Paying Living Expenses:   Food Insecurity:   . Worried About Charity fundraiser in the Last Year:   . Arboriculturist in the Last Year:   Transportation Needs:   . Film/video editor (Medical):   Marland Kitchen Lack of Transportation (Non-Medical):   Physical Activity:   .  Days of Exercise per Week:   . Minutes of Exercise per Session:   Stress:   . Feeling of Stress :   Social Connections:   . Frequency of Communication with Friends and Family:   . Frequency of Social Gatherings with Friends and Family:   . Attends Religious Services:   . Active Member of Clubs or Organizations:   . Attends Archivist Meetings:   Marland Kitchen Marital Status:   Intimate Partner Violence:   . Fear of Current or Ex-Partner:   . Emotionally Abused:   Marland Kitchen Physically Abused:   . Sexually Abused:     Current Outpatient Medications on File Prior to Visit  Medication Sig Dispense Refill  . demeclocycline (DECLOMYCIN) 150 MG tablet Take 1 tablet (150 mg total) by mouth 2 (two) times daily. 180 tablet 2  . HYDROcodone-acetaminophen (NORCO/VICODIN) 5-325 MG tablet Take 1 tablet by mouth every 6 (six) hours as needed for moderate pain or severe pain. 15 tablet 0  . LORazepam (ATIVAN) 1 MG tablet Take 1-2 tablets (1-2 mg total) by mouth 2 (two) times daily. Take 1 tablet at noon and 2 tablets at bedtime (Patient taking differently: Take 2 mg by mouth 2 (two) times daily.  Take 2 tablets at bedtime) 30 tablet 0  . Multiple Vitamins-Minerals (PRESERVISION AREDS) CAPS Take 1 capsule by mouth 2 (two) times daily.    . polyethylene glycol (MIRALAX / GLYCOLAX) packet Take 17 g by mouth daily.      . sodium chloride (OCEAN) 0.65 % SOLN nasal spray Place 1 spray into both nostrils 3 (three) times daily as needed for congestion.    . sodium chloride 1 g tablet TAKE 4 TABLETS BY MOUTH EVERY DAY 360 tablet 2  . tamsulosin (FLOMAX) 0.4 MG CAPS capsule Take 0.4 mg by mouth at bedtime.     No current facility-administered medications on file prior to visit.    Allergies  Allergen Reactions  . Bee Venom Anaphylaxis    Other reaction(s): Other (See Comments) Loss of consciousness  . Nsaids     Other reaction(s): intol  . Other Other (See Comments)    Family History  Problem Relation Age of  Onset  . Diverticulosis Mother   . Melanoma Mother   . Lung cancer Father   . Colon cancer Other        1st cousion.  . Allergic rhinitis Neg Hx   . Angioedema Neg Hx   . Asthma Neg Hx   . Eczema Neg Hx   . Immunodeficiency Neg Hx   . Urticaria Neg Hx     BP 110/70   Pulse 73   Ht 5\' 5"  (1.651 m)   Wt 129 lb (58.5 kg)   SpO2 97%   BMI 21.47 kg/m    Review of Systems Denies falls and SOB    Objective:   Physical Exam Vital signs: see vs page Gen: elderly, frail, no distress EXT: 1+ bilat leg edema.   GAIT: gait is steady, with a walker.    Lab Results  Component Value Date   CREATININE 0.99 01/08/2020   BUN 17 01/08/2020   NA 134 (L) 01/08/2020   K 4.4 01/08/2020   CL 101 01/08/2020   CO2 29 01/08/2020       Assessment & Plan:  Edema: Medicare does not pay for BNP unless SOB.  I have sent a prescription to your pharmacy, to increase lasix Hyponatremia, mild: Please continue the same medication.  Weakness: not related to the above.  Patient Instructions  Blood tests are requested for you today.  We'll let you know about the results.  you should continue to minimize lorazepam and alcohol, or avoid altogether.   Please come back for a follow-up appointment in 4 months.

## 2020-01-11 ENCOUNTER — Telehealth: Payer: Self-pay

## 2020-01-11 NOTE — Telephone Encounter (Signed)
LAB RESULTS  Lab results were reviewed by Dr. Ellison. A letter has been mailed to pt home address. For future reference, letter can be found in Epic. 

## 2020-01-11 NOTE — Telephone Encounter (Signed)
-----   Message from Renato Shin, MD sent at 01/08/2020  4:43 PM EDT ----- please contact patient's son: Sodium is just borderline low.  Due to the swelling of the legs, please increase the furosemide to 40 mg qd.  I have sent a prescription to your pharmacy.  I'll see you next time.

## 2020-01-15 DIAGNOSIS — K4090 Unilateral inguinal hernia, without obstruction or gangrene, not specified as recurrent: Secondary | ICD-10-CM | POA: Diagnosis not present

## 2020-01-15 DIAGNOSIS — N401 Enlarged prostate with lower urinary tract symptoms: Secondary | ICD-10-CM | POA: Diagnosis not present

## 2020-01-15 DIAGNOSIS — R5383 Other fatigue: Secondary | ICD-10-CM | POA: Diagnosis not present

## 2020-01-15 DIAGNOSIS — F429 Obsessive-compulsive disorder, unspecified: Secondary | ICD-10-CM | POA: Diagnosis not present

## 2020-01-15 DIAGNOSIS — E222 Syndrome of inappropriate secretion of antidiuretic hormone: Secondary | ICD-10-CM | POA: Diagnosis not present

## 2020-01-15 DIAGNOSIS — Z79899 Other long term (current) drug therapy: Secondary | ICD-10-CM | POA: Diagnosis not present

## 2020-01-22 DIAGNOSIS — F411 Generalized anxiety disorder: Secondary | ICD-10-CM | POA: Diagnosis not present

## 2020-01-22 DIAGNOSIS — M549 Dorsalgia, unspecified: Secondary | ICD-10-CM | POA: Diagnosis not present

## 2020-01-22 DIAGNOSIS — E871 Hypo-osmolality and hyponatremia: Secondary | ICD-10-CM | POA: Diagnosis not present

## 2020-01-22 DIAGNOSIS — M1990 Unspecified osteoarthritis, unspecified site: Secondary | ICD-10-CM | POA: Diagnosis not present

## 2020-01-22 DIAGNOSIS — K219 Gastro-esophageal reflux disease without esophagitis: Secondary | ICD-10-CM | POA: Diagnosis not present

## 2020-02-10 DIAGNOSIS — D649 Anemia, unspecified: Secondary | ICD-10-CM | POA: Diagnosis not present

## 2020-03-23 DIAGNOSIS — E871 Hypo-osmolality and hyponatremia: Secondary | ICD-10-CM | POA: Diagnosis not present

## 2020-03-23 DIAGNOSIS — R5383 Other fatigue: Secondary | ICD-10-CM | POA: Diagnosis not present

## 2020-03-23 DIAGNOSIS — D649 Anemia, unspecified: Secondary | ICD-10-CM | POA: Diagnosis not present

## 2020-03-23 DIAGNOSIS — R609 Edema, unspecified: Secondary | ICD-10-CM | POA: Diagnosis not present

## 2020-03-24 DIAGNOSIS — F339 Major depressive disorder, recurrent, unspecified: Secondary | ICD-10-CM | POA: Diagnosis not present

## 2020-04-14 ENCOUNTER — Other Ambulatory Visit: Payer: Self-pay | Admitting: Endocrinology

## 2020-04-15 NOTE — Telephone Encounter (Signed)
Dosage is 40 mg qd. Please refill PRN

## 2020-04-15 NOTE — Telephone Encounter (Signed)
Please advise if you want to refill this Rx

## 2020-05-13 ENCOUNTER — Ambulatory Visit: Payer: BLUE CROSS/BLUE SHIELD | Admitting: Endocrinology

## 2020-05-21 DIAGNOSIS — F419 Anxiety disorder, unspecified: Secondary | ICD-10-CM | POA: Diagnosis not present

## 2020-05-21 DIAGNOSIS — K219 Gastro-esophageal reflux disease without esophagitis: Secondary | ICD-10-CM | POA: Diagnosis not present

## 2020-05-21 DIAGNOSIS — M5136 Other intervertebral disc degeneration, lumbar region: Secondary | ICD-10-CM | POA: Diagnosis not present

## 2020-05-21 DIAGNOSIS — E871 Hypo-osmolality and hyponatremia: Secondary | ICD-10-CM | POA: Diagnosis not present

## 2020-05-21 DIAGNOSIS — M1990 Unspecified osteoarthritis, unspecified site: Secondary | ICD-10-CM | POA: Diagnosis not present

## 2020-05-27 ENCOUNTER — Ambulatory Visit: Payer: BLUE CROSS/BLUE SHIELD | Admitting: Endocrinology

## 2020-06-06 DIAGNOSIS — L821 Other seborrheic keratosis: Secondary | ICD-10-CM | POA: Diagnosis not present

## 2020-06-06 DIAGNOSIS — L570 Actinic keratosis: Secondary | ICD-10-CM | POA: Diagnosis not present

## 2020-06-10 DIAGNOSIS — D649 Anemia, unspecified: Secondary | ICD-10-CM | POA: Diagnosis not present

## 2020-06-10 DIAGNOSIS — R899 Unspecified abnormal finding in specimens from other organs, systems and tissues: Secondary | ICD-10-CM | POA: Diagnosis not present

## 2020-06-17 DIAGNOSIS — Z23 Encounter for immunization: Secondary | ICD-10-CM | POA: Diagnosis not present

## 2020-07-01 ENCOUNTER — Ambulatory Visit (INDEPENDENT_AMBULATORY_CARE_PROVIDER_SITE_OTHER): Payer: Medicare Other | Admitting: Endocrinology

## 2020-07-01 ENCOUNTER — Other Ambulatory Visit: Payer: Self-pay

## 2020-07-01 VITALS — BP 110/72 | HR 72 | Ht 65.0 in | Wt 127.8 lb

## 2020-07-01 DIAGNOSIS — E871 Hypo-osmolality and hyponatremia: Secondary | ICD-10-CM | POA: Diagnosis not present

## 2020-07-01 NOTE — Progress Notes (Signed)
Subjective:    Patient ID: Adam Swanson., male    DOB: Oct 08, 1927, 86 y.o.   MRN: [id number]  HPI Hx is from pt and his son.  Pt returns for f/u of hyponatremia (dx'ed 2012; no secondary cause was found; VP level was undetectable, and urine osm was 484, so nephogenic cause is presumed; CT showed no evidence of hydronephrosis or urinary calculi; he was rx'ed declomycin, which the [facility] pays for; NaCl was added in 2019).  Main symptom is fatigue, worse later in the day.   Past Medical History:  Diagnosis Date  . Anxiety   . Colon polyp    hyperplastic  . Diverticulosis of colon (without mention of hemorrhage)   . Esophageal stricture   . GERD (gastroesophageal reflux disease)   . Hiatal hernia   . Lower extremity weakness   . OCD (obsessive compulsive disorder)   . Viral hepatitis 08/1983    Past Surgical History:  Procedure Laterality Date  . COLONOSCOPY    . [place]    Social History   Socioeconomic History  . Marital status: Married    Spouse name: Not on file  . Number of children: 2  . Years of education: HS  . Highest education level: Not on file  Occupational History  . Occupation: Retired  Tobacco Use  . Smoking status: Never Smoker  . Smokeless tobacco: Never Used  Vaping Use  . Vaping Use: Never used  Substance and Sexual Activity  . Alcohol use: Yes    Alcohol/week: 7.0 standard drinks    Types: 7 Standard drinks or equivalent per week    Comment: occasional  . Drug use: No  . Sexual activity: Not on file  Other Topics Concern  . Not on file  Social History Narrative   Lives at home alone.   Right-handed.   No caffeine use.   Social Determinants of Health   Financial Resource Strain:   . Difficulty of Paying Living Expenses: Not on file  Food Insecurity:   . Worried About Charity fundraiser in the Last Year: Not on file  . Ran Out of Food in the Last Year: Not on file  Transportation Needs:   . Lack of Transportation  (Medical): Not on file  . Lack of Transportation (Non-Medical): Not on file  Physical Activity:   . Days of Exercise per Week: Not on file  . Minutes of Exercise per Session: Not on file  Stress:   . Feeling of Stress : Not on file  Social Connections:   . Frequency of Communication with Friends and Family: Not on file  . Frequency of Social Gatherings with Friends and Family: Not on file  . Attends Religious Services: Not on file  . Active Member of Clubs or Organizations: Not on file  . Attends Archivist Meetings: Not on file  . Marital Status: Not on file  Intimate Partner Violence:   . Fear of Current or Ex-Partner: Not on file  . Emotionally Abused: Not on file  . Physically Abused: Not on file  . Sexually Abused: Not on file    Current Outpatient Medications on File Prior to Visit  Medication Sig Dispense Refill  . demeclocycline (DECLOMYCIN) 150 MG tablet Take 1 tablet (150 mg total) by mouth 2 (two) times daily. 180 tablet 2  . esomeprazole (NEXIUM) 20 MG capsule Take 20 mg by mouth daily at 12 noon.    . famotidine (  PEPCID) 20 MG tablet Take 20 mg by mouth daily. 1 every evening    . furosemide (LASIX) 20 MG tablet Take 2 tablets (40 mg total) by mouth daily. 180 tablet prn  . HYDROcodone-acetaminophen (NORCO/VICODIN) 5-325 MG tablet Take 1 tablet by mouth every 6 (six) hours as needed for moderate pain or severe pain. 15 tablet 0  . LORazepam (ATIVAN) 1 MG tablet Take 1-2 tablets (1-2 mg total) by mouth 2 (two) times daily. Take 1 tablet at noon and 2 tablets at bedtime (Patient taking differently: Take 2 mg by mouth 2 (two) times daily. Take 2 tablets at bedtime) 30 tablet 0  . Multiple Vitamins-Minerals (PRESERVISION AREDS) CAPS Take 1 capsule by mouth 2 (two) times daily.    . polyethylene glycol (MIRALAX / GLYCOLAX) packet Take 17 g by mouth daily.      . sodium chloride (OCEAN) 0.65 % SOLN nasal spray Place 1 spray into both nostrils 3 (three) times daily as  needed for congestion.    . sodium chloride 1 g tablet TAKE 4 TABLETS BY MOUTH EVERY DAY 360 tablet 2  . tamsulosin (FLOMAX) 0.4 MG CAPS capsule Take 0.4 mg by mouth at bedtime.    . furosemide (LASIX) 40 MG tablet Take 1 tablet (40 mg total) by mouth daily. (Patient not taking: Reported on 07/01/2020) 90 tablet 3   No current facility-administered medications on file prior to visit.    Allergies  Allergen Reactions  . Bee Venom Anaphylaxis    Other reaction(s): Other (See Comments) Loss of consciousness  . Nsaids     Other reaction(s): intol  . Other Other (See Comments)    Family History  Problem Relation Age of Onset  . Diverticulosis Mother   . Melanoma Mother   . Lung cancer Father   . Colon cancer Other        1st cousion.  . Allergic rhinitis Neg Hx   . Angioedema Neg Hx   . Asthma Neg Hx   . Eczema Neg Hx   . Immunodeficiency Neg Hx   . Urticaria Neg Hx     BP 110/72 (BP Location: Left Arm, Patient Position: Sitting, Cuff Size: Normal)   Pulse 72   Ht 5\' 5"  (1.651 m)   Wt 127 lb 12.8 oz (58 kg)   SpO2 99%   BMI 21.27 kg/m    Review of Systems He has lost 2 lbs since last ov here    Objective:   Physical Exam VITAL SIGNS:  See vs page GENERAL: no distress EXT: no leg edema GAIT: steady, with a walker  outside test results are reviewed: Na+=138     Assessment & Plan:  Hyponatremia: well-controlled.  Please continue the same declomycin and NaCl Please come back for a follow-up appointment in 6 months

## 2020-07-01 NOTE — Patient Instructions (Addendum)
Please continue the same medications.   Please come back for a follow-up appointment in 6 months.    

## 2020-07-11 DIAGNOSIS — F339 Major depressive disorder, recurrent, unspecified: Secondary | ICD-10-CM | POA: Diagnosis not present

## 2020-08-15 ENCOUNTER — Other Ambulatory Visit: Payer: Self-pay | Admitting: Endocrinology

## 2020-08-15 NOTE — Telephone Encounter (Signed)
Okay to refill.  Thanks.

## 2020-09-18 DIAGNOSIS — M1990 Unspecified osteoarthritis, unspecified site: Secondary | ICD-10-CM | POA: Diagnosis not present

## 2020-09-18 DIAGNOSIS — K219 Gastro-esophageal reflux disease without esophagitis: Secondary | ICD-10-CM | POA: Diagnosis not present

## 2020-09-18 DIAGNOSIS — E871 Hypo-osmolality and hyponatremia: Secondary | ICD-10-CM | POA: Diagnosis not present

## 2020-09-18 DIAGNOSIS — M5136 Other intervertebral disc degeneration, lumbar region: Secondary | ICD-10-CM | POA: Diagnosis not present

## 2020-09-18 DIAGNOSIS — F419 Anxiety disorder, unspecified: Secondary | ICD-10-CM | POA: Diagnosis not present

## 2020-10-24 DIAGNOSIS — L821 Other seborrheic keratosis: Secondary | ICD-10-CM | POA: Diagnosis not present

## 2020-10-24 DIAGNOSIS — L820 Inflamed seborrheic keratosis: Secondary | ICD-10-CM | POA: Diagnosis not present

## 2020-12-22 DIAGNOSIS — Z23 Encounter for immunization: Secondary | ICD-10-CM | POA: Diagnosis not present

## 2021-01-06 ENCOUNTER — Other Ambulatory Visit: Payer: Self-pay

## 2021-01-06 ENCOUNTER — Ambulatory Visit (INDEPENDENT_AMBULATORY_CARE_PROVIDER_SITE_OTHER): Payer: Medicare Other | Admitting: Endocrinology

## 2021-01-06 VITALS — BP 118/78 | HR 76 | Ht 65.0 in | Wt 134.0 lb

## 2021-01-06 DIAGNOSIS — E871 Hypo-osmolality and hyponatremia: Secondary | ICD-10-CM

## 2021-01-06 DIAGNOSIS — H6123 Impacted cerumen, bilateral: Secondary | ICD-10-CM | POA: Diagnosis not present

## 2021-01-06 LAB — BASIC METABOLIC PANEL
BUN: 23 mg/dL (ref 6–23)
CO2: 28 mEq/L (ref 19–32)
Calcium: 9 mg/dL (ref 8.4–10.5)
Chloride: 102 mEq/L (ref 96–112)
Creatinine, Ser: 1.32 mg/dL (ref 0.40–1.50)
GFR: 47.25 mL/min — ABNORMAL LOW (ref >60.00)
Glucose, Bld: 98 mg/dL (ref 70–99)
Potassium: 4.1 mEq/L (ref 3.5–5.1)
Sodium: 138 mEq/L (ref 135–145)

## 2021-01-06 NOTE — Progress Notes (Signed)
Subjective:    Patient ID: Adam Cota., male    DOB: 11-Nov-1927, 87 y.o.   MRN: [id number]  HPI Hx is from pt and his son.  Pt returns for f/u of hyponatremia (dx'ed 2012; no secondary cause was found; VP level was undetectable, and urine osm was 484, so nephogenic cause is presumed; CT showed no evidence of hydronephrosis or urinary calculi; he was rx'ed declomycin, which the [place] pays for; NaCl was added in 2019).  Main symptom is again fatigue.    Past Medical History:  Diagnosis Date  . Anxiety   . Colon polyp    hyperplastic  . Diverticulosis of colon (without mention of hemorrhage)   . Esophageal stricture   . GERD (gastroesophageal reflux disease)   . Hiatal hernia   . Lower extremity weakness   . OCD (obsessive compulsive disorder)   . Viral hepatitis 08/1983    Past Surgical History:  Procedure Laterality Date  . COLONOSCOPY    . [place]    Social History   Socioeconomic History  . Marital status: Married    Spouse name: Not on file  . Number of children: 2  . Years of education: HS  . Highest education level: Not on file  Occupational History  . Occupation: Retired  Tobacco Use  . Smoking status: Never Smoker  . Smokeless tobacco: Never Used  Vaping Use  . Vaping Use: Never used  Substance and Sexual Activity  . Alcohol use: Yes    Alcohol/week: 7.0 standard drinks    Types: 7 Standard drinks or equivalent per week    Comment: occasional  . Drug use: No  . Sexual activity: Not on file  Other Topics Concern  . Not on file  Social History Narrative   Lives at home alone.   Right-handed.   No caffeine use.   Social Determinants of Health   Financial Resource Strain: Not on file  Food Insecurity: Not on file  Transportation Needs: Not on file  Physical Activity: Not on file  Stress: Not on file  Social Connections: Not on file  Intimate Partner Violence: Not on file    Current Outpatient Medications on File Prior to  Visit  Medication Sig Dispense Refill  . demeclocycline (DECLOMYCIN) 150 MG tablet Take 1 tablet (150 mg total) by mouth 2 (two) times daily. 180 tablet 2  . esomeprazole (NEXIUM) 20 MG capsule Take 20 mg by mouth daily at 12 noon.    . furosemide (LASIX) 40 MG tablet Take 1 tablet (40 mg total) by mouth daily. 90 tablet 3  . HYDROcodone-acetaminophen (NORCO/VICODIN) 5-325 MG tablet Take 1 tablet by mouth every 6 (six) hours as needed for moderate pain or severe pain. 15 tablet 0  . LORazepam (ATIVAN) 1 MG tablet Take 1-2 tablets (1-2 mg total) by mouth 2 (two) times daily. Take 1 tablet at noon and 2 tablets at bedtime (Patient taking differently: Take 2 mg by mouth 2 (two) times daily. Take 2 tablets at bedtime) 30 tablet 0  . Multiple Vitamins-Minerals (PRESERVISION AREDS) CAPS Take 1 capsule by mouth 2 (two) times daily.    . polyethylene glycol (MIRALAX / GLYCOLAX) packet Take 17 g by mouth daily.    . sodium chloride (OCEAN) 0.65 % SOLN nasal spray Place 1 spray into both nostrils 3 (three) times daily as needed for congestion.    . sodium chloride 1 g tablet TAKE 4 TABLETS (4 G TOTAL) BY  MOUTH DAILY. 360 tablet 1  . tamsulosin (FLOMAX) 0.4 MG CAPS capsule Take 0.4 mg by mouth at bedtime.     No current facility-administered medications on file prior to visit.    Allergies  Allergen Reactions  . Bee Venom Anaphylaxis    Other reaction(s): Other (See Comments) Loss of consciousness  . Nsaids     Other reaction(s): intol  . Other Other (See Comments)    Family History  Problem Relation Age of Onset  . Diverticulosis Mother   . Melanoma Mother   . Lung cancer Father   . Colon cancer Other        1st cousion.  . Allergic rhinitis Neg Hx   . Angioedema Neg Hx   . Asthma Neg Hx   . Eczema Neg Hx   . Immunodeficiency Neg Hx   . Urticaria Neg Hx     BP 118/78 (BP Location: Right Arm, Patient Position: Sitting, Cuff Size: Normal)   Pulse 76   Ht 5\' 5"  (1.651 m)   Wt 134 lb  (60.8 kg)   SpO2 98%   BMI 22.30 kg/m    Review of Systems     Objective:   Physical Exam VITAL SIGNS:  See vs page GENERAL: no distress Ext: no leg edema   Lab Results  Component Value Date   CREATININE 1.32 01/06/2021   BUN 23 01/06/2021   NA 138 01/06/2021   K 4.1 01/06/2021   CL 102 01/06/2021   CO2 28 01/06/2021      Assessment & Plan:  Hyponatremia: well-controlled.  Please continue the same lasix, NaCl, and declomycin

## 2021-01-06 NOTE — Patient Instructions (Signed)
Blood tests are requested for you today.  We'll let you know about the results.  Please come back for a follow-up appointment in 6 months.   

## 2021-01-24 DIAGNOSIS — K219 Gastro-esophageal reflux disease without esophagitis: Secondary | ICD-10-CM | POA: Diagnosis not present

## 2021-01-24 DIAGNOSIS — N401 Enlarged prostate with lower urinary tract symptoms: Secondary | ICD-10-CM | POA: Diagnosis not present

## 2021-01-26 ENCOUNTER — Other Ambulatory Visit: Payer: Self-pay | Admitting: Endocrinology

## 2021-01-30 ENCOUNTER — Telehealth: Payer: Self-pay | Admitting: Endocrinology

## 2021-01-30 NOTE — Telephone Encounter (Signed)
Medications needs to be sent to   [facility]  Phone number :[phone] Fax : [phone] ATT to [facility]   From now on and not the [facility]

## 2021-02-09 DIAGNOSIS — F339 Major depressive disorder, recurrent, unspecified: Secondary | ICD-10-CM | POA: Diagnosis not present

## 2021-02-20 ENCOUNTER — Other Ambulatory Visit: Payer: Self-pay | Admitting: Endocrinology

## 2021-04-13 DIAGNOSIS — R8290 Unspecified abnormal findings in urine: Secondary | ICD-10-CM | POA: Diagnosis not present

## 2021-05-16 ENCOUNTER — Other Ambulatory Visit: Payer: Self-pay | Admitting: Endocrinology

## 2021-05-25 DIAGNOSIS — Z23 Encounter for immunization: Secondary | ICD-10-CM | POA: Diagnosis not present

## 2021-05-31 ENCOUNTER — Encounter (HOSPITAL_COMMUNITY): Payer: Self-pay

## 2021-05-31 ENCOUNTER — Emergency Department (HOSPITAL_COMMUNITY)
Admission: EM | Admit: 2021-05-31 | Discharge: 2021-06-01 | Disposition: A | Discharge status: Home / Self Care | Payer: Medicare Other | Attending: Emergency Medicine | Admitting: Emergency Medicine

## 2021-05-31 ENCOUNTER — Other Ambulatory Visit: Payer: Self-pay

## 2021-05-31 DIAGNOSIS — Z85038 Personal history of other malignant neoplasm of large intestine: Secondary | ICD-10-CM | POA: Insufficient documentation

## 2021-05-31 DIAGNOSIS — R531 Weakness: Secondary | ICD-10-CM | POA: Insufficient documentation

## 2021-05-31 DIAGNOSIS — Z20822 Contact with and (suspected) exposure to covid-19: Secondary | ICD-10-CM | POA: Diagnosis not present

## 2021-05-31 DIAGNOSIS — Z79899 Other long term (current) drug therapy: Secondary | ICD-10-CM | POA: Insufficient documentation

## 2021-05-31 DIAGNOSIS — R899 Unspecified abnormal finding in specimens from other organs, systems and tissues: Secondary | ICD-10-CM

## 2021-05-31 DIAGNOSIS — K449 Diaphragmatic hernia without obstruction or gangrene: Secondary | ICD-10-CM | POA: Diagnosis not present

## 2021-05-31 DIAGNOSIS — E871 Hypo-osmolality and hyponatremia: Secondary | ICD-10-CM | POA: Insufficient documentation

## 2021-05-31 DIAGNOSIS — R9431 Abnormal electrocardiogram [ECG] [EKG]: Secondary | ICD-10-CM | POA: Diagnosis not present

## 2021-05-31 DIAGNOSIS — R079 Chest pain, unspecified: Secondary | ICD-10-CM | POA: Diagnosis not present

## 2021-05-31 DIAGNOSIS — I10 Essential (primary) hypertension: Secondary | ICD-10-CM | POA: Diagnosis not present

## 2021-05-31 LAB — CBC WITH DIFFERENTIAL/PLATELET
Abs Immature Granulocytes: 0.02 10*3/uL (ref 0.00–0.07)
Basophils Absolute: 0 10*3/uL (ref 0.0–0.1)
Basophils Relative: 1 %
Eosinophils Absolute: 0.3 10*3/uL (ref 0.0–0.5)
Eosinophils Relative: 4 %
HCT: 35.4 % — ABNORMAL LOW (ref 39.0–52.0)
Hemoglobin: 11.4 g/dL — ABNORMAL LOW (ref 13.0–17.0)
Immature Granulocytes: 0 %
Lymphocytes Relative: 20 %
Lymphs Abs: 1.5 10*3/uL (ref 0.7–4.0)
MCH: 28.6 pg (ref 26.0–34.0)
MCHC: 32.2 g/dL (ref 30.0–36.0)
MCV: 88.9 fL (ref 80.0–100.0)
Monocytes Absolute: 1 10*3/uL (ref 0.1–1.0)
Monocytes Relative: 14 %
Neutro Abs: 4.7 10*3/uL (ref 1.7–7.7)
Neutrophils Relative %: 61 %
Platelets: 236 10*3/uL (ref 150–400)
RBC: 3.98 MIL/uL — ABNORMAL LOW (ref 4.22–5.81)
RDW: 15.9 % — ABNORMAL HIGH (ref 11.5–15.5)
WBC: 7.6 10*3/uL (ref 4.0–10.5)
nRBC: 0 % (ref 0.0–0.2)

## 2021-05-31 NOTE — ED Triage Notes (Signed)
Pt reports not feeling well and states that he has been weak. Pt states that he had a high potassium today when his nurse checked it.

## 2021-06-01 ENCOUNTER — Emergency Department (HOSPITAL_COMMUNITY): Payer: Medicare Other

## 2021-06-01 DIAGNOSIS — R079 Chest pain, unspecified: Secondary | ICD-10-CM | POA: Diagnosis not present

## 2021-06-01 DIAGNOSIS — K449 Diaphragmatic hernia without obstruction or gangrene: Secondary | ICD-10-CM | POA: Diagnosis not present

## 2021-06-01 DIAGNOSIS — R531 Weakness: Secondary | ICD-10-CM | POA: Diagnosis not present

## 2021-06-01 LAB — RESP PANEL BY RT-PCR (FLU A&B, COVID) ARPGX2
Influenza A by PCR: NEGATIVE
Influenza B by PCR: NEGATIVE
SARS Coronavirus 2 by RT PCR: NEGATIVE

## 2021-06-01 LAB — TROPONIN I (HIGH SENSITIVITY)
Troponin I (High Sensitivity): 10 ng/L (ref <18)
Troponin I (High Sensitivity): 11 ng/L (ref <18)

## 2021-06-01 LAB — COMPREHENSIVE METABOLIC PANEL
ALT: 14 U/L (ref 0–44)
AST: 21 U/L (ref 15–41)
Albumin: 3.7 g/dL (ref 3.5–5.0)
Alkaline Phosphatase: 88 U/L (ref 38–126)
Anion gap: 8 (ref 5–15)
BUN: 21 mg/dL (ref 8–23)
CO2: 25 mmol/L (ref 22–32)
Calcium: 8.8 mg/dL — ABNORMAL LOW (ref 8.9–10.3)
Chloride: 97 mmol/L — ABNORMAL LOW (ref 98–111)
Creatinine, Ser: 1.18 mg/dL (ref 0.61–1.24)
GFR, Estimated: 58 mL/min — ABNORMAL LOW (ref >60)
Glucose, Bld: 101 mg/dL — ABNORMAL HIGH (ref 70–99)
Potassium: 4.1 mmol/L (ref 3.5–5.1)
Sodium: 130 mmol/L — ABNORMAL LOW (ref 135–145)
Total Bilirubin: 0.7 mg/dL (ref 0.3–1.2)
Total Protein: 7.2 g/dL (ref 6.5–8.1)

## 2021-06-01 LAB — URINALYSIS, ROUTINE W REFLEX MICROSCOPIC
Bilirubin Urine: NEGATIVE
Glucose, UA: NEGATIVE mg/dL
Hgb urine dipstick: NEGATIVE
Ketones, ur: NEGATIVE mg/dL
Leukocytes,Ua: NEGATIVE
Nitrite: NEGATIVE
Protein, ur: NEGATIVE mg/dL
Specific Gravity, Urine: <1.005 — ABNORMAL LOW (ref 1.005–1.030)
pH: 7 (ref 5.0–8.0)

## 2021-06-01 LAB — D-DIMER, QUANTITATIVE: D-Dimer, Quant: 0.43 ug/mL-FEU (ref 0.00–0.50)

## 2021-06-01 LAB — POC OCCULT BLOOD, ED: Fecal Occult Bld: NEGATIVE

## 2021-06-01 NOTE — ED Provider Notes (Signed)
[facility] DEPT Provider Note   CSN: [id number] Arrival date & time: 05/31/21  2327     History Chief Complaint  Patient presents with   Weakness    Adam Swanson. is a 87 y.o. male.  Patient here with his son.  He was sent by his living facility after a blood draw that showed a high potassium of 6.5.  Patient and son states the patient has been fatigued for quite some time in a matter of years.  While the patient was having lunch with his son today he began to complain that he was "dizzy" and the facility decided to do the lab work.  They got the result at 10 PM and told the patient to come to the hospital because his potassium was dangerously high.  Denies feeling dizzy and when asked about the symptoms he explains that he was feeling fatigued as he has for many years.  He denies any room spinning dizziness or lightheadedness.  No chest pain or shortness of breath.  No focal weakness, numbness or tingling.  No headache.  No back or neck pain. No abdominal pain, nausea, vomiting, dysuria, hematuria. No recent black or bloody stools.  His doctor recently retired he has not seen one for several years.  No blood thinner use.  On arrival here patient's potassium was normal.  Suspect the sample was likely hemolyzed.  The history is provided by the patient and a relative.  Weakness Associated symptoms: no abdominal pain, no arthralgias, no cough, no dizziness, no dysuria, no fever, no headaches, no myalgias, no nausea, no shortness of breath and no vomiting       Past Medical History:  Diagnosis Date   Anxiety    Colon polyp    hyperplastic   Diverticulosis of colon (without mention of hemorrhage)    Esophageal stricture    GERD (gastroesophageal reflux disease)    Hiatal hernia    Lower extremity weakness    OCD (obsessive compulsive disorder)    Viral hepatitis 08/1983    Patient Active Problem List   Diagnosis Date Noted   Gait abnormality  01/30/2017   Weakness 01/28/2017   Low back pain without sciatica 01/28/2017   Bronchopneumonia 09/24/2015   Hyponatremia 09/24/2015   Abnormal ECG 09/24/2015   HCAP (healthcare-associated pneumonia) 09/24/2015   Heart murmur 09/24/2015   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   Allergic rhinitis Neg Hx    Angioedema Neg Hx    Asthma Neg Hx    Eczema Neg Hx    Immunodeficiency Neg Hx    Urticaria Neg Hx     Social History   Tobacco Use   Smoking status: Never   Smokeless tobacco: Never  Vaping Use   Vaping Use: Never used  Substance Use Topics   Alcohol use: Yes    Alcohol/week: 7.0 standard drinks    Types: 7 Standard drinks or equivalent per week    Comment: occasional   Drug use: No    Home Medications Prior to Admission medications   Medication Sig Start Date End Date Taking? Authorizing Provider  demeclocycline (DECLOMYCIN) 150 MG tablet Take 1 tablet (150 mg total) by mouth 2 (two) times daily. 08/21/19   Renato Shin, MD  esomeprazole (NEXIUM) 20 MG capsule Take 20 mg by mouth daily at 12 noon.    [provider]  furosemide (LASIX) 20 MG tablet TAKE 2 TABLET = 40 MG BY MOUTH ONCE DAILY 05/17/21   Renato Shin, MD  furosemide (LASIX) 40 MG tablet Take 1 tablet (40 mg total) by mouth daily. 01/08/20   Renato Shin, MD  HYDROcodone-acetaminophen  (NORCO/VICODIN) 5-325 MG tablet Take 1 tablet by mouth every 6 (six) hours as needed for moderate pain or severe pain. 06/14/18   Domenic Moras, PA-C  LORazepam (ATIVAN) 1 MG tablet Take 1-2 tablets (1-2 mg total) by mouth 2 (two) times daily. Take 1 tablet at noon and 2 tablets at bedtime Patient taking differently: Take 2 mg by mouth 2 (two) times daily. Take 2 tablets at bedtime 09/26/15   Bonnielee Haff, MD  Multiple Vitamins-Minerals (PRESERVISION AREDS) CAPS Take 1 capsule by mouth 2 (two) times daily.    [provider]  polyethylene glycol (MIRALAX / GLYCOLAX) packet Take 17 g by mouth daily.    [provider]  sodium chloride (OCEAN) 0.65 % SOLN nasal spray Place 1 spray into both nostrils 3 (three) times daily as needed for congestion.    [provider]  sodium chloride 1 g tablet TAKE 4 TABLETS = 4 GM BY MOUTH ONCE DAILY 02/20/21   Renato Shin, MD  tamsulosin (FLOMAX) 0.4 MG CAPS capsule Take 0.4 mg by mouth at bedtime.    [provider]    Allergies    Bee venom, Nsaids, and Other  Review of Systems   Review of Systems  Constitutional:  Positive for fatigue. Negative for activity change, appetite change and fever.  HENT:  Negative for congestion and rhinorrhea.   Respiratory:  Negative for cough, chest tightness and shortness of breath.   Gastrointestinal:  Negative for abdominal pain, nausea and vomiting.  Genitourinary:  Negative for dysuria and hematuria.  Musculoskeletal:  Negative for arthralgias and myalgias.  Skin:  Negative for rash and wound.  Neurological:  Positive for weakness. Negative for dizziness, light-headedness and headaches.   all other systems are negative except as noted in the HPI and PMH.   Physical Exam Updated Vital Signs BP (!) 147/86   Pulse 72   Temp 98.2 F (36.8 C) (Oral)   Resp 20   Ht 5\' 5"  (1.651 m)   Wt 59 kg   SpO2 95%   BMI 21.63 kg/m   Physical Exam Vitals and nursing note reviewed.   Constitutional:      General: He is not in acute distress.    Appearance: He is well-developed.  HENT:     Head: Normocephalic and atraumatic.     Mouth/Throat:     Pharynx: No oropharyngeal exudate.  Eyes:     Conjunctiva/sclera: Conjunctivae normal.     Pupils: Pupils are equal, round, and reactive to light.  Neck:     Comments: No meningismus. Cardiovascular:     Rate and Rhythm: Normal rate and regular rhythm.  Heart sounds: Normal heart sounds. No murmur heard. Pulmonary:     Effort: Pulmonary effort is normal. No respiratory distress.     Breath sounds: Normal breath sounds.  Abdominal:     Palpations: Abdomen is soft.     Tenderness: There is no abdominal tenderness. There is no guarding or rebound.  Genitourinary:    Comments: Brown stool. No gross blood Musculoskeletal:        General: No tenderness. Normal range of motion.     Cervical back: Normal range of motion and neck supple.  Skin:    General: Skin is warm.  Neurological:     Mental Status: He is alert and oriented to person, place, and time.     Cranial Nerves: No cranial nerve deficit.     Motor: No abnormal muscle tone.     Coordination: Coordination normal.     Comments:  5/5 strength throughout. CN 2-12 intact.Equal grip strength.   Psychiatric:        Behavior: Behavior normal.    ED Results / Procedures / Treatments   Labs (all labs ordered are listed, but only abnormal results are displayed) Labs Reviewed  CBC WITH DIFFERENTIAL/PLATELET - Abnormal; Notable for the following components:      Result Value   RBC 3.98 (*)    Hemoglobin 11.4 (*)    HCT 35.4 (*)    RDW 15.9 (*)    All other components within normal limits  COMPREHENSIVE METABOLIC PANEL - Abnormal; Notable for the following components:   Sodium 130 (*)    Chloride 97 (*)    Glucose, Bld 101 (*)    Calcium 8.8 (*)    GFR, Estimated 58 (*)    All other components within normal limits  URINALYSIS, ROUTINE W REFLEX MICROSCOPIC  - Abnormal; Notable for the following components:   Specific Gravity, Urine <1.005 (*)    All other components within normal limits  RESP PANEL BY RT-PCR (FLU A&B, COVID) ARPGX2  D-DIMER, QUANTITATIVE  POC OCCULT BLOOD, ED  TROPONIN I (HIGH SENSITIVITY)  TROPONIN I (HIGH SENSITIVITY)    EKG EKG Interpretation  Date/Time:  Wednesday May 31 2021 23:42:52 EDT Ventricular Rate:  74 PR Interval:  167 QRS Duration: 116 QT Interval:  398 QTC Calculation: 442 R Axis:   -56 Text Interpretation: Sinus rhythm Left anterior fascicular block No significant change was found Confirmed by Ezequiel Essex [phone]) on 06/01/2021 4:28:14 AM  Radiology DG Chest Portable 1 View  Result Date: 06/01/2021 CLINICAL DATA:  87 year old male with chest pain and weakness. EXAM: PORTABLE CHEST 1 VIEW COMPARISON:  Chest radiographs 03/23/2020 and earlier. FINDINGS: Portable AP semi upright view at 0456 hours. Chronic hiatal hernia, partially containing gas, appears progressed since last year and at least moderate. Lower lung volumes with lordotic positioning and accentuation of cardiac size. Increased pulmonary vascularity/interstitial opacity in a symmetric fashion. No pneumothorax or consolidation. No definite pleural effusion. Visualized tracheal air column is within normal limits. Osteopenia. No acute osseous abnormality identified. Paucity of bowel gas in the upper abdomen. IMPRESSION: 1. Accentuation of cardiac size and diffusely increased vascular congestion since last year, suspicious for acute interstitial edema. 2. Chronic hiatal hernia appears increased, moderate. Electronically Signed   By: Genevie Ann M.D.   On: 06/01/2021 05:57    Procedures Procedures   Medications Ordered in ED Medications - No data to display  ED Course  I have reviewed the triage vital signs and the nursing notes.  Pertinent labs &  imaging results that were available during my care of the patient were reviewed by me and  considered in my medical decision making (see chart for details).    MDM Rules/Calculators/A&P                           Patient sent from his facility with reportedly high potassium.  Vitals are stable.  Denies any pain.  EKG is sinus rhythm.  Potassium here is normal.  Suspect outpatient sample was hemolyzed  Urinalysis is negative.  Troponin is negative. Hemoglobin has down trended since 2017 from 13-11.  He denies any black or bloody stools. FOBT is negative.   Labs otherwise reassuring. Mild hyponatremia which he also has a history of and takes diuretics as outpatient . Cr normal. Troponin negative x2. Low suspicion for ACS.  No emergent cause for generalized weakness identified.  Patient stable for outpatient follow-up.  He does need a new PCP as he is recently retired. Referrals given.  Return precautions discussed.   BP 139/88   Pulse 65   Temp 98.2 F (36.8 C) (Oral)   Resp 16   Ht 5\' 5"  (1.651 m)   Wt 59 kg   SpO2 99%   BMI 21.63 kg/m    [name]. was evaluated in Emergency Department on 06/01/2021 for the symptoms described in the history of present illness. He was evaluated in the context of the global COVID-19 pandemic, which necessitated consideration that the patient might be at risk for infection with the SARS-CoV-2 virus that causes COVID-19. Institutional protocols and algorithms that pertain to the evaluation of patients at risk for COVID-19 are in a state of rapid change based on information released by regulatory bodies including the CDC and federal and state organizations. These policies and algorithms were followed during the patient's care in the ED.   Final Clinical Impression(s) / ED Diagnoses Final diagnoses:  Weakness  Abnormal laboratory test result    Rx / DC Orders ED Discharge Orders     None        Aubriella Perezgarcia, Annie Main, MD 06/01/21 [phone]

## 2021-06-01 NOTE — Discharge Instructions (Addendum)
As we discussed your potassium lab was likely an error.  Your potassium here is normal.  There is no evidence of heart attack.  Follow-up with your primary doctor.  Return to the ED with chest pain, shortness of breath, any other concerns.

## 2021-07-07 ENCOUNTER — Ambulatory Visit: Payer: Medicare Other | Admitting: Endocrinology

## 2021-07-11 DIAGNOSIS — Z9181 History of falling: Secondary | ICD-10-CM | POA: Diagnosis not present

## 2021-07-11 DIAGNOSIS — W19XXXA Unspecified fall, initial encounter: Secondary | ICD-10-CM | POA: Diagnosis not present

## 2021-07-11 DIAGNOSIS — M6281 Muscle weakness (generalized): Secondary | ICD-10-CM | POA: Diagnosis not present

## 2021-07-11 DIAGNOSIS — M47816 Spondylosis without myelopathy or radiculopathy, lumbar region: Secondary | ICD-10-CM | POA: Diagnosis not present

## 2021-07-11 DIAGNOSIS — M47814 Spondylosis without myelopathy or radiculopathy, thoracic region: Secondary | ICD-10-CM | POA: Diagnosis not present

## 2021-07-11 DIAGNOSIS — M549 Dorsalgia, unspecified: Secondary | ICD-10-CM | POA: Diagnosis not present

## 2021-07-11 DIAGNOSIS — M47812 Spondylosis without myelopathy or radiculopathy, cervical region: Secondary | ICD-10-CM | POA: Diagnosis not present

## 2021-07-11 DIAGNOSIS — S20402A Unspecified superficial injuries of left back wall of thorax, initial encounter: Secondary | ICD-10-CM | POA: Diagnosis not present

## 2021-07-12 DIAGNOSIS — S20222D Contusion of left back wall of thorax, subsequent encounter: Secondary | ICD-10-CM | POA: Diagnosis not present

## 2021-07-12 DIAGNOSIS — M6281 Muscle weakness (generalized): Secondary | ICD-10-CM | POA: Diagnosis not present

## 2021-07-12 DIAGNOSIS — Z9181 History of falling: Secondary | ICD-10-CM | POA: Diagnosis not present

## 2021-07-14 DIAGNOSIS — N390 Urinary tract infection, site not specified: Secondary | ICD-10-CM | POA: Diagnosis not present

## 2021-07-21 DIAGNOSIS — N401 Enlarged prostate with lower urinary tract symptoms: Secondary | ICD-10-CM | POA: Diagnosis not present

## 2021-07-21 DIAGNOSIS — Z91199 Patient's noncompliance with other medical treatment and regimen due to unspecified reason: Secondary | ICD-10-CM | POA: Diagnosis not present

## 2021-07-21 DIAGNOSIS — R3915 Urgency of urination: Secondary | ICD-10-CM | POA: Diagnosis not present

## 2021-07-21 DIAGNOSIS — M6281 Muscle weakness (generalized): Secondary | ICD-10-CM | POA: Diagnosis not present

## 2021-07-21 DIAGNOSIS — Z9181 History of falling: Secondary | ICD-10-CM | POA: Diagnosis not present

## 2021-07-21 DIAGNOSIS — R269 Unspecified abnormalities of gait and mobility: Secondary | ICD-10-CM | POA: Diagnosis not present

## 2021-11-28 DIAGNOSIS — M6281 Muscle weakness (generalized): Secondary | ICD-10-CM | POA: Diagnosis not present

## 2021-11-28 DIAGNOSIS — R410 Disorientation, unspecified: Secondary | ICD-10-CM | POA: Diagnosis not present

## 2021-11-28 DIAGNOSIS — W19XXXA Unspecified fall, initial encounter: Secondary | ICD-10-CM | POA: Diagnosis not present

## 2021-11-28 DIAGNOSIS — R413 Other amnesia: Secondary | ICD-10-CM | POA: Diagnosis not present

## 2021-12-04 DIAGNOSIS — Z9181 History of falling: Secondary | ICD-10-CM | POA: Diagnosis not present

## 2021-12-04 DIAGNOSIS — R2689 Other abnormalities of gait and mobility: Secondary | ICD-10-CM | POA: Diagnosis not present

## 2021-12-04 DIAGNOSIS — R41841 Cognitive communication deficit: Secondary | ICD-10-CM | POA: Diagnosis not present

## 2021-12-08 DIAGNOSIS — R41841 Cognitive communication deficit: Secondary | ICD-10-CM | POA: Diagnosis not present

## 2021-12-08 DIAGNOSIS — Z9181 History of falling: Secondary | ICD-10-CM | POA: Diagnosis not present

## 2021-12-08 DIAGNOSIS — R2689 Other abnormalities of gait and mobility: Secondary | ICD-10-CM | POA: Diagnosis not present

## 2021-12-11 DIAGNOSIS — R2689 Other abnormalities of gait and mobility: Secondary | ICD-10-CM | POA: Diagnosis not present

## 2021-12-11 DIAGNOSIS — Z9181 History of falling: Secondary | ICD-10-CM | POA: Diagnosis not present

## 2021-12-13 DIAGNOSIS — R2689 Other abnormalities of gait and mobility: Secondary | ICD-10-CM | POA: Diagnosis not present

## 2021-12-13 DIAGNOSIS — Z9181 History of falling: Secondary | ICD-10-CM | POA: Diagnosis not present

## 2021-12-14 ENCOUNTER — Emergency Department (HOSPITAL_BASED_OUTPATIENT_CLINIC_OR_DEPARTMENT_OTHER)
Admission: EM | Admit: 2021-12-14 | Discharge: 2021-12-14 | Disposition: A | Discharge status: Home / Self Care | Payer: Medicare Other | Attending: Emergency Medicine | Admitting: Emergency Medicine

## 2021-12-14 ENCOUNTER — Emergency Department (HOSPITAL_BASED_OUTPATIENT_CLINIC_OR_DEPARTMENT_OTHER): Payer: Medicare Other

## 2021-12-14 ENCOUNTER — Encounter (HOSPITAL_BASED_OUTPATIENT_CLINIC_OR_DEPARTMENT_OTHER): Payer: Self-pay | Admitting: Emergency Medicine

## 2021-12-14 ENCOUNTER — Other Ambulatory Visit: Payer: Self-pay

## 2021-12-14 DIAGNOSIS — Z79899 Other long term (current) drug therapy: Secondary | ICD-10-CM | POA: Diagnosis not present

## 2021-12-14 DIAGNOSIS — R109 Unspecified abdominal pain: Secondary | ICD-10-CM | POA: Diagnosis not present

## 2021-12-14 DIAGNOSIS — R1084 Generalized abdominal pain: Secondary | ICD-10-CM | POA: Diagnosis not present

## 2021-12-14 DIAGNOSIS — R9431 Abnormal electrocardiogram [ECG] [EKG]: Secondary | ICD-10-CM | POA: Diagnosis not present

## 2021-12-14 LAB — COMPREHENSIVE METABOLIC PANEL
ALT: 14 U/L (ref 0–44)
AST: 20 U/L (ref 15–41)
Albumin: 4 g/dL (ref 3.5–5.0)
Alkaline Phosphatase: 60 U/L (ref 38–126)
Anion gap: 9 (ref 5–15)
BUN: 29 mg/dL — ABNORMAL HIGH (ref 8–23)
CO2: 28 mmol/L (ref 22–32)
Calcium: 9.6 mg/dL (ref 8.9–10.3)
Chloride: 101 mmol/L (ref 98–111)
Creatinine, Ser: 1.2 mg/dL (ref 0.61–1.24)
GFR, Estimated: 57 mL/min — ABNORMAL LOW (ref >60)
Glucose, Bld: 160 mg/dL — ABNORMAL HIGH (ref 70–99)
Potassium: 4 mmol/L (ref 3.5–5.1)
Sodium: 138 mmol/L (ref 135–145)
Total Bilirubin: 0.7 mg/dL (ref 0.3–1.2)
Total Protein: 7.2 g/dL (ref 6.5–8.1)

## 2021-12-14 LAB — CBC
HCT: 37.7 % — ABNORMAL LOW (ref 39.0–52.0)
Hemoglobin: 12.3 g/dL — ABNORMAL LOW (ref 13.0–17.0)
MCH: 29.6 pg (ref 26.0–34.0)
MCHC: 32.6 g/dL (ref 30.0–36.0)
MCV: 90.8 fL (ref 80.0–100.0)
Platelets: 219 10*3/uL (ref 150–400)
RBC: 4.15 MIL/uL — ABNORMAL LOW (ref 4.22–5.81)
RDW: 15.3 % (ref 11.5–15.5)
WBC: 5.9 10*3/uL (ref 4.0–10.5)
nRBC: 0 % (ref 0.0–0.2)

## 2021-12-14 LAB — URINALYSIS, ROUTINE W REFLEX MICROSCOPIC
Bilirubin Urine: NEGATIVE
Glucose, UA: NEGATIVE mg/dL
Hgb urine dipstick: NEGATIVE
Ketones, ur: NEGATIVE mg/dL
Leukocytes,Ua: NEGATIVE
Nitrite: NEGATIVE
Protein, ur: NEGATIVE mg/dL
Specific Gravity, Urine: 1.016 (ref 1.005–1.030)
pH: 5.5 (ref 5.0–8.0)

## 2021-12-14 LAB — LIPASE, BLOOD: Lipase: 36 U/L (ref 11–51)

## 2021-12-14 NOTE — ED Notes (Signed)
ED Provider at bedside. 

## 2021-12-14 NOTE — ED Triage Notes (Signed)
Pt via pov from [facility] independent living; had lunch and had a sharp pain in his upper abdomen. The pains have been intermittent since then. Pt denies n/v; has diarrhea, but this is normal for him. Pt alert & oriented, nad noted.  ?

## 2021-12-14 NOTE — ED Provider Notes (Signed)
?[facility] EMERGENC[facility] DEPT ?Provider Note ? ? ?CSN: [id number] ?Arrival date & time: 12/14/21  1419 ? ?  ? ?History ? ?Chief Complaint  ?Patient presents with  ? Abdominal Pain  ? ? ?Adam Swanson. is a 88 y.o. male. ? ?HPI ?Patient ate lunch today.  He lives at independent living at [place].  His son brought him [employer] sandwich with [place] fries.  His son reports they do have a weekly meal from fast food which the patient typically tolerates well.  This afternoon about 20 minutes after he had eaten the patient got a severe pain in his central abdomen.  He reports it was a fairly sharp and short pain but it happened recurrently several times over the ensuing hour he estimates he has had about 7 sharp pains like that.  He never developed vomiting.  Patient reports he is chronically has loose stool.  Patient did have a bowel movement after arriving to the emergency department.  He has been pain-free since here. ? ?Home Medications ?Prior to Admission medications   ?Medication Sig Start Date End Date Taking? Authorizing Provider  ?demeclocycline (DECLOMYCIN) 150 MG tablet Take 1 tablet (150 mg total) by mouth 2 (two) times daily. 08/21/19  Yes Renato Shin, MD  ?furosemide (LASIX) 20 MG tablet TAKE 2 TABLET = 40 MG BY MOUTH ONCE DAILY 05/17/21  Yes Renato Shin, MD  ?furosemide (LASIX) 40 MG tablet Take 1 tablet (40 mg total) by mouth daily. 01/08/20  Yes Renato Shin, MD  ?LORazepam (ATIVAN) 1 MG tablet Take 1-2 tablets (1-2 mg total) by mouth 2 (two) times daily. Take 1 tablet at noon and 2 tablets at bedtime ?Patient taking differently: Take 2 mg by mouth 2 (two) times daily. Take 2 tablets at bedtime 09/26/15  Yes Bonnielee Haff, MD  ?sodium chloride (OCEAN) 0.65 % SOLN nasal spray Place 1 spray into both nostrils 3 (three) times daily as needed for congestion.   Yes [provider]  ?sodium chloride 1 g tablet TAKE 4 TABLETS = 4 GM BY MOUTH ONCE DAILY 02/20/21  Yes Renato Shin,  MD  ?tamsulosin (FLOMAX) 0.4 MG CAPS capsule Take 0.4 mg by mouth at bedtime.   Yes [provider]  ?esomeprazole (NEXIUM) 20 MG capsule Take 20 mg by mouth daily at 12 noon.    [provider]  ?HYDROcodone-acetaminophen (NORCO/VICODIN) 5-325 MG tablet Take 1 tablet by mouth every 6 (six) hours as needed for moderate pain or severe pain. 06/14/18   Domenic Moras, PA-C  ?Multiple Vitamins-Minerals (PRESERVISION AREDS) CAPS Take 1 capsule by mouth 2 (two) times daily.    [provider]  ?polyethylene glycol (MIRALAX / GLYCOLAX) packet Take 17 g by mouth daily.    [provider]  ?   ? ?Allergies    ?Bee venom, Nsaids, and Other   ? ?Review of Systems   ?Review of Systems ?10 Systems reviewed negative except as per HPI ?Physical Exam ?Updated Vital Signs ?BP 123/79   Pulse 60   Temp 97.9 ?F (36.6 ?C)   Resp 11   Ht '5\' 7"'$  (1.702 m)   Wt 61.2 kg   SpO2 96%   BMI 21.14 kg/m?  ?Physical Exam ?Constitutional:   ?   Comments: Alert nontoxic well in appearance.  Excellent physical condition for age  ?HENT:  ?   Mouth/Throat:  ?   Pharynx: Oropharynx is clear.  ?Eyes:  ?   Extraocular Movements: Extraocular movements intact.  ?Cardiovascular:  ?  Rate and Rhythm: Normal rate.  ?Pulmonary:  ?   Effort: Pulmonary effort is normal.  ?   Breath sounds: Normal breath sounds.  ?Abdominal:  ?   General: There is no distension.  ?   Palpations: Abdomen is soft.  ?   Tenderness: There is no abdominal tenderness. There is no guarding.  ?Musculoskeletal:     ?   General: No swelling. Normal range of motion.  ?   Right lower leg: No edema.  ?   Left lower leg: No edema.  ?Skin: ?   General: Skin is warm and dry.  ?Neurological:  ?   General: No focal deficit present.  ?   Mental Status: He is oriented to person, place, and time.  ?   Coordination: Coordination normal.  ?Psychiatric:     ?   Mood and Affect: Mood normal.  ? ? ?ED Results / Procedures / Treatments   ?Labs ?(all labs ordered are  listed, but only abnormal results are displayed) ?Labs Reviewed  ?COMPREHENSIVE METABOLIC PANEL - Abnormal; Notable for the following components:  ?    Result Value  ? Glucose, Bld 160 (*)   ? BUN 29 (*)   ? GFR, Estimated 57 (*)   ? All other components within normal limits  ?CBC - Abnormal; Notable for the following components:  ? RBC 4.15 (*)   ? Hemoglobin 12.3 (*)   ? HCT 37.7 (*)   ? All other components within normal limits  ?LIPASE, BLOOD  ?URINALYSIS, ROUTINE W REFLEX MICROSCOPIC  ? ? ?EKG ?EKG Interpretation ? ?Date/Time:  Thursday December 14 2021 14:35:29 EDT ?Ventricular Rate:  74 ?PR Interval:  114 ?QRS Duration: 112 ?QT Interval:  394 ?QTC Calculation: 437 ?R Axis:   -18 ?Text Interpretation: Sinus rhythm with Abnormal ECG no sig change from previous When compared with ECG of 31-May-2021 23:42, PREVIOUS ECG IS PRESENT Confirmed by Charlesetta Shanks [phone]) on 12/14/2021 4:30:01 PM ? ?Radiology ?US Abdomen Limited RUQ (LIVER/GB) ? ?Result Date: 12/14/2021 ?CLINICAL DATA:  A 88 year old male presents with sharp pain after eating in the RIGHT upper quadrant. EXAM: ULTRASOUND ABDOMEN LIMITED RIGHT UPPER QUADRANT COMPARISON:  CT of the abdomen and pelvis from 2017. FINDINGS: Gallbladder: No gallstones or wall thickening visualized. No sonographic Murphy sign noted by sonographer. Assessment is limited due to sonographic window. The gallbladder is contracted perhaps given lack of fasting prior to this exam. Common bile duct: Diameter: 4.5 mm. No intrahepatic biliary duct distension is visualized. Liver: Coarsened hepatic echotexture. Suggestion of mildly nodular hepatic contour. No visible lesion. Portal vein is patent on color Doppler imaging with normal direction of blood flow towards the liver. Other: None. IMPRESSION: No sonographic evidence of acute biliary process. Note that the gallbladder assessment is limited by sonographic window and patient body habitus. Question of coarsened hepatic echotexture and mild  surface nodularity. Correlate with any clinical or laboratory evidence of liver disease. Electronically Signed   By: Zetta Bills M.D.   On: 12/14/2021 17:33   ? ?Procedures ?Procedures  ? ? ?Medications Ordered in ED ?Medications - No data to display ? ?ED Course/ Medical Decision Making/ A&P ?  ?                        ?Medical Decision Making ?Amount and/or Complexity of Data Reviewed ?Labs: ordered. ?Radiology: ordered. ? ? ?Patient is clinically well in appearance.  He has self-limited episode of colicky abdominal pain.  Abdominal examination  is nontender at time of evaluation.  Patient had eaten [employer] sandwich and [place] fries about 20 minutes prior to pain onset.  I had suspicion for possible biliary colic versus GI colic.  Low suspicion for surgical emergency or vascular emergency.  Basic lab work obtained.  I also added gallbladder ultrasound for possible biliary colic episode. ? ?Patient is reassessed and remains pain-free and well in appearance.  Vital signs remain normal.  Diagnostic work-up unremarkable with no significant findings on gallbladder ultrasound. ? ?Patient son has been at bedside.  He is advised of all findings and plan.  Careful return precautions reviewed.  Dietary recommendations made for low-fat diet and small meals. ? ? ? ? ? ? ? ?Final Clinical Impression(s) / ED Diagnoses ?Final diagnoses:  ?Generalized abdominal pain  ? ? ?Rx / DC Orders ?ED Discharge Orders   ? ? None  ? ?  ? ? ?  ?Charlesetta Shanks, MD ?12/14/21 1837 ? ?

## 2021-12-20 DIAGNOSIS — R2689 Other abnormalities of gait and mobility: Secondary | ICD-10-CM | POA: Diagnosis not present

## 2021-12-20 DIAGNOSIS — Z9181 History of falling: Secondary | ICD-10-CM | POA: Diagnosis not present

## 2021-12-26 ENCOUNTER — Emergency Department (HOSPITAL_COMMUNITY): Payer: Medicare Other

## 2021-12-26 ENCOUNTER — Other Ambulatory Visit: Payer: Self-pay

## 2021-12-26 ENCOUNTER — Inpatient Hospital Stay (HOSPITAL_COMMUNITY)
Admission: EM | Admit: 2021-12-26 | Discharge: 2022-01-02 | DRG: 199 | Disposition: A | Discharge status: Skilled Nursing Facility | Payer: Medicare Other | Source: Skilled Nursing Facility | Attending: Family Medicine | Admitting: Family Medicine

## 2021-12-26 DIAGNOSIS — E871 Hypo-osmolality and hyponatremia: Secondary | ICD-10-CM | POA: Diagnosis present

## 2021-12-26 DIAGNOSIS — E785 Hyperlipidemia, unspecified: Secondary | ICD-10-CM | POA: Diagnosis not present

## 2021-12-26 DIAGNOSIS — J90 Pleural effusion, not elsewhere classified: Secondary | ICD-10-CM | POA: Diagnosis not present

## 2021-12-26 DIAGNOSIS — S270XXD Traumatic pneumothorax, subsequent encounter: Secondary | ICD-10-CM | POA: Diagnosis not present

## 2021-12-26 DIAGNOSIS — I63421 Cerebral infarction due to embolism of right anterior cerebral artery: Secondary | ICD-10-CM

## 2021-12-26 DIAGNOSIS — Z79899 Other long term (current) drug therapy: Secondary | ICD-10-CM | POA: Diagnosis not present

## 2021-12-26 DIAGNOSIS — R471 Dysarthria and anarthria: Secondary | ICD-10-CM | POA: Diagnosis present

## 2021-12-26 DIAGNOSIS — S2232XA Fracture of one rib, left side, initial encounter for closed fracture: Secondary | ICD-10-CM | POA: Diagnosis not present

## 2021-12-26 DIAGNOSIS — Z91030 Bee allergy status: Secondary | ICD-10-CM | POA: Diagnosis not present

## 2021-12-26 DIAGNOSIS — Z886 Allergy status to analgesic agent status: Secondary | ICD-10-CM

## 2021-12-26 DIAGNOSIS — F03911 Unspecified dementia, unspecified severity, with agitation: Secondary | ICD-10-CM | POA: Diagnosis present

## 2021-12-26 DIAGNOSIS — Y92129 Unspecified place in nursing home as the place of occurrence of the external cause: Secondary | ICD-10-CM

## 2021-12-26 DIAGNOSIS — F419 Anxiety disorder, unspecified: Secondary | ICD-10-CM | POA: Diagnosis not present

## 2021-12-26 DIAGNOSIS — N1831 Chronic kidney disease, stage 3a: Secondary | ICD-10-CM | POA: Diagnosis present

## 2021-12-26 DIAGNOSIS — F05 Delirium due to known physiological condition: Secondary | ICD-10-CM | POA: Diagnosis not present

## 2021-12-26 DIAGNOSIS — J159 Unspecified bacterial pneumonia: Secondary | ICD-10-CM | POA: Diagnosis not present

## 2021-12-26 DIAGNOSIS — N400 Enlarged prostate without lower urinary tract symptoms: Secondary | ICD-10-CM | POA: Diagnosis present

## 2021-12-26 DIAGNOSIS — K219 Gastro-esophageal reflux disease without esophagitis: Secondary | ICD-10-CM | POA: Diagnosis not present

## 2021-12-26 DIAGNOSIS — R2681 Unsteadiness on feet: Secondary | ICD-10-CM | POA: Diagnosis not present

## 2021-12-26 DIAGNOSIS — F0390 Unspecified dementia without behavioral disturbance: Secondary | ICD-10-CM | POA: Diagnosis not present

## 2021-12-26 DIAGNOSIS — J189 Pneumonia, unspecified organism: Secondary | ICD-10-CM | POA: Diagnosis not present

## 2021-12-26 DIAGNOSIS — Z8673 Personal history of transient ischemic attack (TIA), and cerebral infarction without residual deficits: Secondary | ICD-10-CM | POA: Diagnosis not present

## 2021-12-26 DIAGNOSIS — I672 Cerebral atherosclerosis: Secondary | ICD-10-CM | POA: Diagnosis not present

## 2021-12-26 DIAGNOSIS — Z888 Allergy status to other drugs, medicaments and biological substances status: Secondary | ICD-10-CM | POA: Diagnosis not present

## 2021-12-26 DIAGNOSIS — I6389 Other cerebral infarction: Secondary | ICD-10-CM | POA: Diagnosis present

## 2021-12-26 DIAGNOSIS — R410 Disorientation, unspecified: Secondary | ICD-10-CM | POA: Diagnosis not present

## 2021-12-26 DIAGNOSIS — R569 Unspecified convulsions: Secondary | ICD-10-CM

## 2021-12-26 DIAGNOSIS — R41841 Cognitive communication deficit: Secondary | ICD-10-CM | POA: Diagnosis not present

## 2021-12-26 DIAGNOSIS — S270XXS Traumatic pneumothorax, sequela: Secondary | ICD-10-CM | POA: Diagnosis present

## 2021-12-26 DIAGNOSIS — F0394 Unspecified dementia, unspecified severity, with anxiety: Secondary | ICD-10-CM | POA: Diagnosis present

## 2021-12-26 DIAGNOSIS — I639 Cerebral infarction, unspecified: Secondary | ICD-10-CM

## 2021-12-26 DIAGNOSIS — R296 Repeated falls: Secondary | ICD-10-CM | POA: Diagnosis not present

## 2021-12-26 DIAGNOSIS — I480 Paroxysmal atrial fibrillation: Secondary | ICD-10-CM | POA: Diagnosis present

## 2021-12-26 DIAGNOSIS — D631 Anemia in chronic kidney disease: Secondary | ICD-10-CM

## 2021-12-26 DIAGNOSIS — J939 Pneumothorax, unspecified: Secondary | ICD-10-CM | POA: Diagnosis not present

## 2021-12-26 DIAGNOSIS — S199XXA Unspecified injury of neck, initial encounter: Secondary | ICD-10-CM | POA: Diagnosis not present

## 2021-12-26 DIAGNOSIS — R29818 Other symptoms and signs involving the nervous system: Secondary | ICD-10-CM | POA: Diagnosis not present

## 2021-12-26 DIAGNOSIS — S270XXA Traumatic pneumothorax, initial encounter: Principal | ICD-10-CM

## 2021-12-26 DIAGNOSIS — S2242XS Multiple fractures of ribs, left side, sequela: Secondary | ICD-10-CM | POA: Diagnosis present

## 2021-12-26 DIAGNOSIS — Z9181 History of falling: Secondary | ICD-10-CM | POA: Diagnosis not present

## 2021-12-26 DIAGNOSIS — R2689 Other abnormalities of gait and mobility: Secondary | ICD-10-CM | POA: Diagnosis not present

## 2021-12-26 DIAGNOSIS — J180 Bronchopneumonia, unspecified organism: Secondary | ICD-10-CM | POA: Diagnosis not present

## 2021-12-26 DIAGNOSIS — Z043 Encounter for examination and observation following other accident: Secondary | ICD-10-CM | POA: Diagnosis not present

## 2021-12-26 DIAGNOSIS — Z743 Need for continuous supervision: Secondary | ICD-10-CM | POA: Diagnosis not present

## 2021-12-26 DIAGNOSIS — R0902 Hypoxemia: Secondary | ICD-10-CM | POA: Diagnosis not present

## 2021-12-26 DIAGNOSIS — W010XXA Fall on same level from slipping, tripping and stumbling without subsequent striking against object, initial encounter: Secondary | ICD-10-CM | POA: Diagnosis not present

## 2021-12-26 DIAGNOSIS — R918 Other nonspecific abnormal finding of lung field: Secondary | ICD-10-CM | POA: Diagnosis not present

## 2021-12-26 DIAGNOSIS — R4182 Altered mental status, unspecified: Secondary | ICD-10-CM | POA: Diagnosis not present

## 2021-12-26 DIAGNOSIS — I4891 Unspecified atrial fibrillation: Secondary | ICD-10-CM

## 2021-12-26 DIAGNOSIS — W1830XA Fall on same level, unspecified, initial encounter: Secondary | ICD-10-CM | POA: Diagnosis present

## 2021-12-26 DIAGNOSIS — Z781 Physical restraint status: Secondary | ICD-10-CM | POA: Diagnosis not present

## 2021-12-26 DIAGNOSIS — M150 Primary generalized (osteo)arthritis: Secondary | ICD-10-CM | POA: Diagnosis not present

## 2021-12-26 DIAGNOSIS — J9811 Atelectasis: Secondary | ICD-10-CM | POA: Diagnosis not present

## 2021-12-26 DIAGNOSIS — Z8709 Personal history of other diseases of the respiratory system: Secondary | ICD-10-CM | POA: Diagnosis not present

## 2021-12-26 DIAGNOSIS — S2242XA Multiple fractures of ribs, left side, initial encounter for closed fracture: Secondary | ICD-10-CM | POA: Diagnosis present

## 2021-12-26 DIAGNOSIS — G319 Degenerative disease of nervous system, unspecified: Secondary | ICD-10-CM | POA: Diagnosis not present

## 2021-12-26 DIAGNOSIS — I6523 Occlusion and stenosis of bilateral carotid arteries: Secondary | ICD-10-CM | POA: Diagnosis not present

## 2021-12-26 DIAGNOSIS — K5900 Constipation, unspecified: Secondary | ICD-10-CM | POA: Diagnosis not present

## 2021-12-26 DIAGNOSIS — Z7189 Other specified counseling: Secondary | ICD-10-CM | POA: Diagnosis not present

## 2021-12-26 DIAGNOSIS — E876 Hypokalemia: Secondary | ICD-10-CM | POA: Diagnosis present

## 2021-12-26 DIAGNOSIS — R0689 Other abnormalities of breathing: Secondary | ICD-10-CM | POA: Diagnosis not present

## 2021-12-26 DIAGNOSIS — R0602 Shortness of breath: Secondary | ICD-10-CM | POA: Diagnosis not present

## 2021-12-26 DIAGNOSIS — W19XXXA Unspecified fall, initial encounter: Secondary | ICD-10-CM | POA: Diagnosis not present

## 2021-12-26 DIAGNOSIS — R531 Weakness: Secondary | ICD-10-CM | POA: Diagnosis not present

## 2021-12-26 DIAGNOSIS — R279 Unspecified lack of coordination: Secondary | ICD-10-CM | POA: Diagnosis not present

## 2021-12-26 DIAGNOSIS — N189 Chronic kidney disease, unspecified: Secondary | ICD-10-CM

## 2021-12-26 DIAGNOSIS — M4000 Postural kyphosis, site unspecified: Secondary | ICD-10-CM | POA: Diagnosis not present

## 2021-12-26 DIAGNOSIS — M6281 Muscle weakness (generalized): Secondary | ICD-10-CM | POA: Diagnosis not present

## 2021-12-26 LAB — CBC WITH DIFFERENTIAL/PLATELET
Abs Immature Granulocytes: 0.07 10*3/uL (ref 0.00–0.07)
Basophils Absolute: 0.1 10*3/uL (ref 0.0–0.1)
Basophils Relative: 1 %
Eosinophils Absolute: 0.3 10*3/uL (ref 0.0–0.5)
Eosinophils Relative: 3 %
HCT: 40.2 % (ref 39.0–52.0)
Hemoglobin: 12.9 g/dL — ABNORMAL LOW (ref 13.0–17.0)
Immature Granulocytes: 1 %
Lymphocytes Relative: 39 %
Lymphs Abs: 3.9 10*3/uL (ref 0.7–4.0)
MCH: 30.3 pg (ref 26.0–34.0)
MCHC: 32.1 g/dL (ref 30.0–36.0)
MCV: 94.4 fL (ref 80.0–100.0)
Monocytes Absolute: 1.1 10*3/uL — ABNORMAL HIGH (ref 0.1–1.0)
Monocytes Relative: 12 %
Neutro Abs: 4.4 10*3/uL (ref 1.7–7.7)
Neutrophils Relative %: 44 %
Platelets: 218 10*3/uL (ref 150–400)
RBC: 4.26 MIL/uL (ref 4.22–5.81)
RDW: 15.4 % (ref 11.5–15.5)
WBC: 9.9 10*3/uL (ref 4.0–10.5)
nRBC: 0 % (ref 0.0–0.2)

## 2021-12-26 LAB — BASIC METABOLIC PANEL
Anion gap: 19 — ABNORMAL HIGH (ref 5–15)
BUN: 24 mg/dL — ABNORMAL HIGH (ref 8–23)
CO2: 15 mmol/L — ABNORMAL LOW (ref 22–32)
Calcium: 9.1 mg/dL (ref 8.9–10.3)
Chloride: 104 mmol/L (ref 98–111)
Creatinine, Ser: 1.38 mg/dL — ABNORMAL HIGH (ref 0.61–1.24)
GFR, Estimated: 48 mL/min — ABNORMAL LOW (ref >60)
Glucose, Bld: 136 mg/dL — ABNORMAL HIGH (ref 70–99)
Potassium: 3.4 mmol/L — ABNORMAL LOW (ref 3.5–5.1)
Sodium: 138 mmol/L (ref 135–145)

## 2021-12-26 LAB — I-STAT CHEM 8, ED
BUN: 26 mg/dL — ABNORMAL HIGH (ref 8–23)
Calcium, Ion: 1.06 mmol/L — ABNORMAL LOW (ref 1.15–1.40)
Chloride: 105 mmol/L (ref 98–111)
Creatinine, Ser: 1.2 mg/dL (ref 0.61–1.24)
Glucose, Bld: 133 mg/dL — ABNORMAL HIGH (ref 70–99)
HCT: 41 % (ref 39.0–52.0)
Hemoglobin: 13.9 g/dL (ref 13.0–17.0)
Potassium: 3.4 mmol/L — ABNORMAL LOW (ref 3.5–5.1)
Sodium: 138 mmol/L (ref 135–145)
TCO2: 18 mmol/L — ABNORMAL LOW (ref 22–32)

## 2021-12-26 LAB — TROPONIN I (HIGH SENSITIVITY): Troponin I (High Sensitivity): 15 ng/L (ref <18)

## 2021-12-26 MED ORDER — SODIUM CHLORIDE 0.9 % IV BOLUS
500.0000 mL | Freq: Once | INTRAVENOUS | Status: AC
  Administered 2021-12-26: 500 mL via INTRAVENOUS

## 2021-12-26 MED ORDER — SENNOSIDES-DOCUSATE SODIUM 8.6-50 MG PO TABS: 1.0000 | ORAL_TABLET | Freq: Every evening | ORAL | Status: DC | PRN

## 2021-12-26 MED ORDER — FENTANYL CITRATE PF 50 MCG/ML IJ SOSY
PREFILLED_SYRINGE | INTRAMUSCULAR | Status: AC
  Administered 2021-12-26: 100 ug via INTRAVENOUS
  Filled 2021-12-26: qty 2

## 2021-12-26 MED ORDER — OXYCODONE HCL 5 MG PO TABS
2.5000 mg | ORAL_TABLET | ORAL | Status: DC | PRN
  Administered 2021-12-27: 5 mg via ORAL
  Filled 2021-12-26: qty 1

## 2021-12-26 MED ORDER — HEPARIN SODIUM (PORCINE) 5000 UNIT/ML IJ SOLN
5000.0000 [IU] | Freq: Three times a day (TID) | INTRAMUSCULAR | Status: DC
  Administered 2021-12-27 – 2022-01-02 (×18): 5000 [IU] via SUBCUTANEOUS
  Filled 2021-12-26 (×18): qty 1

## 2021-12-26 MED ORDER — TAMSULOSIN HCL 0.4 MG PO CAPS
0.4000 mg | ORAL_CAPSULE | Freq: Every day | ORAL | Status: DC
  Administered 2021-12-27 (×2): 0.4 mg via ORAL
  Filled 2021-12-26 (×3): qty 1

## 2021-12-26 MED ORDER — DILTIAZEM HCL-DEXTROSE 125-5 MG/125ML-% IV SOLN (PREMIX)
INTRAVENOUS | Status: AC
  Filled 2021-12-26: qty 125

## 2021-12-26 MED ORDER — ONDANSETRON HCL 4 MG/2ML IJ SOLN
4.0000 mg | Freq: Four times a day (QID) | INTRAMUSCULAR | Status: DC | PRN
Start: 2021-12-26 — End: 2022-01-02

## 2021-12-26 MED ORDER — SODIUM CHLORIDE 0.9% FLUSH
3.0000 mL | Freq: Two times a day (BID) | INTRAVENOUS | Status: DC
  Administered 2021-12-27 – 2022-01-02 (×8): 3 mL via INTRAVENOUS

## 2021-12-26 MED ORDER — METHOCARBAMOL 500 MG PO TABS
500.0000 mg | ORAL_TABLET | Freq: Four times a day (QID) | ORAL | Status: DC | PRN
Start: 2021-12-26 — End: 2021-12-28

## 2021-12-26 MED ORDER — ACETAMINOPHEN 500 MG PO TABS
1000.0000 mg | ORAL_TABLET | Freq: Four times a day (QID) | ORAL | Status: DC
  Administered 2021-12-27 – 2022-01-02 (×12): 1000 mg via ORAL
  Filled 2021-12-26 (×16): qty 2

## 2021-12-26 MED ORDER — FENTANYL CITRATE PF 50 MCG/ML IJ SOSY
12.5000 ug | PREFILLED_SYRINGE | INTRAMUSCULAR | Status: DC | PRN
  Administered 2021-12-26 – 2021-12-28 (×2): 12.5 ug via INTRAVENOUS
  Filled 2021-12-26 (×2): qty 1

## 2021-12-26 MED ORDER — FENTANYL CITRATE PF 50 MCG/ML IJ SOSY: 100.0000 ug | PREFILLED_SYRINGE | Freq: Once | INTRAMUSCULAR | Status: AC

## 2021-12-26 MED ORDER — DILTIAZEM HCL-DEXTROSE 125-5 MG/125ML-% IV SOLN (PREMIX)
5.0000 mg/h | INTRAVENOUS | Status: DC
  Administered 2021-12-26: 5 mg/h via INTRAVENOUS

## 2021-12-26 MED ORDER — LIDOCAINE HCL 2 % IJ SOLN
INTRAMUSCULAR | Status: AC
  Administered 2021-12-26: 400 mg
  Filled 2021-12-26: qty 20

## 2021-12-26 MED ORDER — ONDANSETRON HCL 4 MG PO TABS
4.0000 mg | ORAL_TABLET | Freq: Four times a day (QID) | ORAL | Status: DC | PRN
Start: 2021-12-26 — End: 2022-01-02

## 2021-12-26 MED ORDER — DILTIAZEM LOAD VIA INFUSION
15.0000 mg | Freq: Once | INTRAVENOUS | Status: AC
  Administered 2021-12-26: 15 mg via INTRAVENOUS
  Filled 2021-12-26: qty 15

## 2021-12-26 NOTE — Progress Notes (Signed)
?   12/26/21 2110  ?Clinical Encounter Type  ?Visited With Patient not available  ?Visit Type Trauma  ?Referral From Nurse  ?Consult/Referral To Chaplain  ? ?Tom Green responded. The patient is being attended to by the medical team. There are no support person present. Chaplain remains available for further support as needed. This note was prepared by Jeanine Luz, M.Div..  For questions please contact by phone [phone].  Marland Kitchen  ?

## 2021-12-26 NOTE — ED Provider Notes (Signed)
?[facility] ?Provider Note ? ? ?CSN: [id number] ?Arrival date & time: 12/26/21  2048 ? ?  ? ?History ? ?Chief Complaint  ?Patient presents with  ? Fall  ? ? ?Adam Swanson. is a 88 y.o. male presenting from independent living with a fall and noted seizure-like activity.  He is a very poor historian and cannot provide any further history on arrival.  Repetitive questioning. ? ?Son by phone tells me the patient fell and struck his head and had a witnessed seizure. The patient has no prior history of seizures according to his son. ? ?HPI ? ?  ? ?Home Medications ?Prior to Admission medications   ?Medication Sig Start Date End Date Taking? Authorizing Provider  ?demeclocycline (DECLOMYCIN) 150 MG tablet Take 1 tablet (150 mg total) by mouth 2 (two) times daily. ?Patient taking differently: Take 150 mg by mouth 2 (two) times daily. Continuously 08/21/19  Yes Renato Shin, MD  ?EPINEPHrine 0.3 mg/0.3 mL IJ SOAJ injection Inject 0.3 mg into the muscle as needed for anaphylaxis. 11/03/21  Yes [provider]  ?esomeprazole (NEXIUM) 20 MG capsule Take 20 mg by mouth daily.   Yes [provider]  ?famotidine (PEPCID) 20 MG tablet Take 20 mg by mouth every evening.   Yes [provider]  ?furosemide (LASIX) 20 MG tablet TAKE 2 TABLET = 40 MG BY MOUTH ONCE DAILY ?Patient taking differently: Take 40 mg by mouth daily. 05/17/21  Yes Renato Shin, MD  ?hydrOXYzine (VISTARIL) 25 MG capsule Take 25 mg by mouth at bedtime as needed (sleep).   Yes [provider]  ?LORazepam (ATIVAN) 1 MG tablet Take 1-2 tablets (1-2 mg total) by mouth 2 (two) times daily. Take 1 tablet at noon and 2 tablets at bedtime ?Patient taking differently: Take 1 mg by mouth at bedtime. 09/26/15  Yes Bonnielee Haff, MD  ?sodium chloride 1 g tablet TAKE 4 TABLETS = 4 GM BY MOUTH ONCE DAILY ?Patient taking differently: Take 2 g by mouth 2 (two) times daily with a meal. 02/20/21  Yes  Renato Shin, MD  ?tamsulosin (FLOMAX) 0.4 MG CAPS capsule Take 0.4 mg by mouth at bedtime.   Yes [provider]  ?furosemide (LASIX) 40 MG tablet Take 1 tablet (40 mg total) by mouth daily. ?Patient not taking: Reported on 12/27/2021 01/08/20   Renato Shin, MD  ?   ? ?Allergies    ?Bee venom, Nsaids, and Other   ? ?Review of Systems   ?Review of Systems ? ?Physical Exam ?Updated Vital Signs ?BP 111/76   Pulse 64   Temp 98.7 ?F (37.1 ?C) (Oral)   Resp 12   Ht '5\' 7"'$  (1.702 m)   Wt 61 kg   SpO2 100%   BMI 21.06 kg/m?  ?Physical Exam ?Constitutional:   ?   General: He is not in acute distress. ?HENT:  ?   Head: Normocephalic and atraumatic.  ?Eyes:  ?   Conjunctiva/sclera: Conjunctivae normal.  ?   Pupils: Pupils are equal, round, and reactive to light.  ?Neck:  ?   Comments: Cervical collar in place ?Cardiovascular:  ?   Rate and Rhythm: Tachycardia present. Rhythm irregular.  ?Pulmonary:  ?   Effort: Pulmonary effort is normal. No respiratory distress.  ?   Comments: Diminished breath sounds left side ?Abdominal:  ?   General: There is no distension.  ?   Tenderness: There is no abdominal tenderness.  ?Skin: ?   General: Skin is  warm and dry.  ?Neurological:  ?   General: No focal deficit present.  ?   Mental Status: He is alert. Mental status is at baseline.  ? ? ?ED Results / Procedures / Treatments   ?Labs ?(all labs ordered are listed, but only abnormal results are displayed) ?Labs Reviewed  ?BASIC METABOLIC PANEL - Abnormal; Notable for the following components:  ?    Result Value  ? Potassium 3.4 (*)   ? CO2 15 (*)   ? Glucose, Bld 136 (*)   ? BUN 24 (*)   ? Creatinine, Ser 1.38 (*)   ? GFR, Estimated 48 (*)   ? Anion gap 19 (*)   ? All other components within normal limits  ?CBC WITH DIFFERENTIAL/PLATELET - Abnormal; Notable for the following components:  ? Hemoglobin 12.9 (*)   ? Monocytes Absolute 1.1 (*)   ? All other components within normal limits  ?BASIC METABOLIC PANEL - Abnormal;  Notable for the following components:  ? Glucose, Bld 137 (*)   ? Calcium 8.6 (*)   ? All other components within normal limits  ?CBC - Abnormal; Notable for the following components:  ? RBC 3.76 (*)   ? Hemoglobin 11.2 (*)   ? HCT 34.5 (*)   ? All other components within normal limits  ?I-STAT CHEM 8, ED - Abnormal; Notable for the following components:  ? Potassium 3.4 (*)   ? BUN 26 (*)   ? Glucose, Bld 133 (*)   ? Calcium, Ion 1.06 (*)   ? TCO2 18 (*)   ? All other components within normal limits  ?TROPONIN I (HIGH SENSITIVITY) - Abnormal; Notable for the following components:  ? Troponin I (High Sensitivity) 22 (*)   ? All other components within normal limits  ?MAGNESIUM  ?URINALYSIS, DIPSTICK ONLY  ?TROPONIN I (HIGH SENSITIVITY)  ? ? ?EKG ?EKG Interpretation ? ?Date/Time:  Tuesday December 26 2021 23:22:09 EDT ?Ventricular Rate:  80 ?PR Interval:  162 ?QRS Duration: 116 ?QT Interval:  378 ?QTC Calculation: 436 ?R Axis:   -69 ?Text Interpretation: Sinus rhythm Left anterior fascicular block Consider anterior infarct When compared with ECG of EARLIER SAME DATE Sinus rhythm has replaced Atrial fibrillation with rapid ventricular response Confirmed by Delora Fuel ([id number]) on 12/26/2021 11:36:05 PM ? ?Radiology ?CT HEAD WO CONTRAST (5MM) ? ?Result Date: 12/26/2021 ?CLINICAL DATA:  Head trauma, moderate-severe; Polytrauma, blunt. Fall, seizure EXAM: CT HEAD WITHOUT CONTRAST CT CERVICAL SPINE WITHOUT CONTRAST TECHNIQUE: Multidetector CT imaging of the head and cervical spine was performed following the standard protocol without intravenous contrast. Multiplanar CT image reconstructions of the cervical spine were also generated. RADIATION DOSE REDUCTION: This exam was performed according to the departmental dose-optimization program which includes automated exposure control, adjustment of the mA and/or kV according to patient size and/or use of iterative reconstruction technique. COMPARISON:  09/23/2015 FINDINGS: CT HEAD  FINDINGS Brain: Normal anatomic configuration. Parenchymal volume loss is commensurate with the patient's age. Moderate periventricular white matter changes are present likely reflecting the sequela of small vessel ischemia, stable since prior examination. Remote lacunar infarct again noted within the right insular cortex no abnormal intra or extra-axial mass lesion or fluid collection. No abnormal mass effect or midline shift. No evidence of acute intracranial hemorrhage or infarct. Stable borderline ventriculomegaly, proportional to the degree of parenchymal atrophy and likely representing ex vacuo dilation. Cerebellum unremarkable. Vascular: No asymmetric hyperdense vasculature at the skull base. Skull: Intact Sinuses/Orbits: Paranasal sinuses are clear. Ocular lenses have  been removed. Orbits are otherwise unremarkable. Other: Mastoid air cells and middle ear cavities are clear. CT CERVICAL SPINE FINDINGS Alignment: 2 mm anterolisthesis C4-5 is likely degenerative in nature. Otherwise normal cervical lordosis. Skull base and vertebrae: Craniocervical alignment is normal. Atlantodental interval is not widened. No acute fracture of the cervical spine. Vertebral body height is preserved. Soft tissues and spinal canal: No prevertebral fluid or swelling. No visible canal hematoma. Disc levels: There is intervertebral disc space narrowing and endplate remodeling at Z6-1 and C5-C7 in keeping with changes of advanced degenerative disc disease. Prevertebral soft tissues are not thickened. Spinal canal is widely patent. There is multilevel uncovertebral and facet arthrosis resulting in multilevel mild-to-moderate neuroforaminal narrowing, most severe bilaterally at C3-4. Upper chest: Left pneumothorax is partially visualized. Other: None IMPRESSION: No acute intracranial abnormality.  No calvarial fracture. Moderate senescent change. Remote right insular cortex lacunar infarct. These findings appears stable since prior  examination of 09/23/2015. No acute fracture or listhesis of the cervical spine. Degenerative changes as outlined above. Left pneumothorax, incompletely visualized. Electronically Signed   By: Fidela Salisbury M.D.   O

## 2021-12-26 NOTE — ED Notes (Addendum)
Trauma Response Nurse Documentation ? ? ?Adam Swanson. is a 88 y.o. male arriving to [facility] ED via EMS ? ?On No antithrombotic. Trauma was activated as a Level 2 by ED charge RN based on the following trauma criteria GCS 10-14 associated with trauma or AVPU < A. Trauma RN, primary ED RN and EDP at the bedside on patient arrival. Patient cleared for CT by Dr. Langston Masker. Patient to CT with team. GCS 14, which is baseline for patient. ? ?History  ? Past Medical History:  ?Diagnosis Date  ? Anxiety   ? Colon polyp   ? hyperplastic  ? Diverticulosis of colon (without mention of hemorrhage)   ? Esophageal stricture   ? GERD (gastroesophageal reflux disease)   ? Hiatal hernia   ? Lower extremity weakness   ? OCD (obsessive compulsive disorder)   ? Viral hepatitis 08/1983  ?  ? Past Surgical History:  ?Procedure Laterality Date  ? COLONOSCOPY    ? HEMORRHOID SURGERY    ? 1965  ?  ? ? ? ?Initial Focused Assessment (If applicable, or please see trauma documentation): ?Alert and confused male presents via EMS, c-collar in place, GCS 14, repetitive questioning ?Airway patent, arrives on nonrebreather, changed to nasal cannula shortly after arrival, breathing unlabored ?No obvious uncontrolled hemorrhage, no deformities noted ?EMS IV in place L AC 16G ? ?CT's Completed:   ?CT Head, CT Maxillofacial, and CT C-Spine  ? ?Interventions:  ?CTs as above ?Portable chest and pelvis XRAY ?18G PIV right forearm ?Trauma lab draw ?C-collar changed to [facility] J for fit, comfort ?Diltiazem bolus/infusion for AFIB ?Fentanyl IV for pain control ?Pigtail chest tube L chest for large pneumothorax, 160ML blood out at time of insertion ?Family presence/updates facilitated by primary RN ?NS bolus 500ML ? ?Plan for disposition:  ?[facility] to [facility]  ? ?Consults completed:  ?Trauma surgery at 2449, called at 2214 ?Hospitalist at 2243 ?Neurology at 2140 ? ?Event Summary: ?Patient arrives via EMS from an independent living facility after  a fall, and a 5 minute seizure shortly after. Patient confused at baseline, chart states "short term memory issues." Alert to self, unable to report events of tonight. Repetitive questioning, asking where his wallet is (left at facility by EMS). No obvious injuries on assessment, patient states "call my son" when asked if anything hurts. Arrives on nonrebreather 15L, O2 on room air noted to be 88%, placed on 2L [place]. ? ?EMS collar changed to [place] J for comfort/fit on arrival. Assisted EDP with left chest tube placement. ? ?MTP Summary (If applicable): NA ? ?Bedside handoff with ED RN Kae Heller.   ? ?[name]  ?Trauma Response RN ? ?Please call TRN at [phone] for further assistance. ?  ?

## 2021-12-26 NOTE — ED Notes (Signed)
HR converts to NSR. Rate in the 80's. EKG obtained. Posey Pronto MD notified. ?

## 2021-12-26 NOTE — TOC CAGE-AID Note (Signed)
Transition of Care (TOC) - CAGE-AID Screening ? ? ?Patient Details  ?Name: Adam Swanson. ?MRN: [id number] ?Date of Birth: Nov 09, 1927 ? ?Transition of Care (TOC) CM/SW Contact:    ?Army Melia, RN ?Phone [place] ?12/26/2021, 11:39 PM ? ? ?Clinical Narrative: ? ?Patient with baseline confusion, short term memory issues. GCS 14. Unable to accurately screen. ? ?CAGE-AID Screening: ?Substance Abuse Screening unable to be completed due to: : Patient unable to participate ? ?  ?  ?  ?  ?  ? ?  ? ?  ? ? ? ? ? ? ?

## 2021-12-26 NOTE — ED Triage Notes (Signed)
BIB [facility] from [facility]. [facility] and had seizure shortly after. Pt has hx same. New diagnosis afib- no thinners noted in records- unsure if taking any. Repetitive questioning.  ? ?142/90 88% RA ?92% on 15LPM  ?CBG 135 ?

## 2021-12-26 NOTE — ED Notes (Signed)
Attempt to call son made by Kae Heller primary RN, straight to voicemail. ?

## 2021-12-26 NOTE — Consult Note (Signed)
Surgical Evaluation ?Requesting provider: Dr. Octaviano Glow ? ?Chief Complaint: fall ? ?HPI: History taken from ED staff as the patient is a poor historian and somewhat confused, unable to give meaningful history.  88 year old male who had an unwitnessed fall at his independent living facility.  He was able to push the call bell after this.  When the staff arrived they noted that he was having a generalized seizure for about 5 minutes.  He has no known seizure history.  Patient was noted to be in A-fib with RVR on arrival as well which is a new issue.  He complains of left chest wall pain, but denies any other pain.  Has undergone plain films of the chest and pelvis as well as CT scan of the head and C-spine.  Noted to have left 6 through 9 rib fractures and a left pneumothorax.  Pigtail chest tube has been placed and trauma surgery is consulted for further recommendations ? ?Allergies  ?Allergen Reactions  ? Bee Venom Anaphylaxis  ?  Other reaction(s): Other (See Comments) ?Loss of consciousness  ? Nsaids   ?  Other reaction(s): intol  ? Other Other (See Comments)  ? ? ?Past Medical History:  ?Diagnosis Date  ? Anxiety   ? Colon polyp   ? hyperplastic  ? Diverticulosis of colon (without mention of hemorrhage)   ? Esophageal stricture   ? GERD (gastroesophageal reflux disease)   ? Hiatal hernia   ? Lower extremity weakness   ? OCD (obsessive compulsive disorder)   ? Viral hepatitis 08/1983  ? ? ?Past Surgical History:  ?Procedure Laterality Date  ? COLONOSCOPY    ? HEMORRHOID SURGERY    ? 1965  ? ? ?Family History  ?Problem Relation Age of Onset  ? Diverticulosis Mother   ? Melanoma Mother   ? Lung cancer Father   ? Colon cancer Other   ?     1st cousion.  ? Allergic rhinitis Neg Hx   ? Angioedema Neg Hx   ? Asthma Neg Hx   ? Eczema Neg Hx   ? Immunodeficiency Neg Hx   ? Urticaria Neg Hx   ? ? ?Social History  ? ?Socioeconomic History  ? Marital status: Widowed  ?  Spouse name: Not on file  ? Number of children: 2   ? Years of education: HS  ? Highest education level: Not on file  ?Occupational History  ? Occupation: Retired  ?Tobacco Use  ? Smoking status: Never  ? Smokeless tobacco: Never  ?Vaping Use  ? Vaping Use: Never used  ?Substance and Sexual Activity  ? Alcohol use: Yes  ?  Alcohol/week: 7.0 standard drinks  ?  Types: 7 Standard drinks or equivalent per week  ?  Comment: occasional  ? Drug use: No  ? Sexual activity: Not on file  ?Other Topics Concern  ? Not on file  ?Social History Narrative  ? Lives at home alone.  ? Right-handed.  ? No caffeine use.  ? ?Social Determinants of Health  ? ?Financial Resource Strain: Not on file  ?Food Insecurity: Not on file  ?Transportation Needs: Not on file  ?Physical Activity: Not on file  ?Stress: Not on file  ?Social Connections: Not on file  ? ? ?No current facility-administered medications on file prior to encounter.  ? ?Current Outpatient Medications on File Prior to Encounter  ?Medication Sig Dispense Refill  ? demeclocycline (DECLOMYCIN) 150 MG tablet Take 1 tablet (150 mg total) by mouth 2 (two) times  daily. 180 tablet 2  ? esomeprazole (NEXIUM) 20 MG capsule Take 20 mg by mouth daily at 12 noon.    ? furosemide (LASIX) 20 MG tablet TAKE 2 TABLET = 40 MG BY MOUTH ONCE DAILY 180 tablet 0  ? furosemide (LASIX) 40 MG tablet Take 1 tablet (40 mg total) by mouth daily. 90 tablet 3  ? HYDROcodone-acetaminophen (NORCO/VICODIN) 5-325 MG tablet Take 1 tablet by mouth every 6 (six) hours as needed for moderate pain or severe pain. 15 tablet 0  ? LORazepam (ATIVAN) 1 MG tablet Take 1-2 tablets (1-2 mg total) by mouth 2 (two) times daily. Take 1 tablet at noon and 2 tablets at bedtime (Patient taking differently: Take 2 mg by mouth 2 (two) times daily. Take 2 tablets at bedtime) 30 tablet 0  ? Multiple Vitamins-Minerals (PRESERVISION AREDS) CAPS Take 1 capsule by mouth 2 (two) times daily.    ? polyethylene glycol (MIRALAX / GLYCOLAX) packet Take 17 g by mouth daily.    ? sodium  chloride (OCEAN) 0.65 % SOLN nasal spray Place 1 spray into both nostrils 3 (three) times daily as needed for congestion.    ? sodium chloride 1 g tablet TAKE 4 TABLETS = 4 GM BY MOUTH ONCE DAILY 360 tablet 3  ? tamsulosin (FLOMAX) 0.4 MG CAPS capsule Take 0.4 mg by mouth at bedtime.    ? ? ?Review of Systems: a complete, 10pt review of systems was unable to be completed due to patient mental status ? ?Physical Exam: ?Vitals:  ? 12/26/21 2200 12/26/21 2217  ?BP: 114/73   ?Pulse: 96   ?Resp: (!) 35   ?Temp:    ?SpO2: 94% 94%  ? ?Gen: Slightly groggy, answers some questions appropriately, ?Eyes: lids and conjunctivae normal, no icterus. Pupils equally round and reactive to light.  ?Neck: Trachea midline, no crepitus or hematoma ?Chest: Normal respiratory effort, saturating 98% on nasal cannula.  Tenderness of the left chest wall.  Left chest tube in place with about 100 cc of serosanguineous Drainage ?Cardiovascular: Heart rate is in the 130s.  No lower extremity edema. ?Gastrointestinal: soft, nondistended, nontender. No mass, hepatomegaly or splenomegaly.  No pelvic tenderness ?Muscoloskeletal: no clubbing or cyanosis of the fingers.  Strength is symmetrical throughout.  Range of motion of bilateral upper and lower extremities normal without pain, crepitation or contracture. ?Neuro: cranial nerves grossly intact.  Sensation intact to light touch diffusely. ?Psych: Unable to assess due to mental status ?Skin: warm and dry ? ? ? ?  Latest Ref Rng & Units 12/26/2021  ?  8:57 PM 12/26/2021  ?  8:52 PM 12/14/2021  ?  2:43 PM  ?CBC  ?WBC 4.0 - 10.5 K/uL  9.9   5.9    ?Hemoglobin 13.0 - 17.0 g/dL 13.9   12.9   12.3    ?Hematocrit 39.0 - 52.0 % 41.0   40.2   37.7    ?Platelets 150 - 400 K/uL  218   219    ? ? ? ?  Latest Ref Rng & Units 12/26/2021  ?  8:57 PM 12/26/2021  ?  8:52 PM 12/14/2021  ?  2:43 PM  ?CMP  ?Glucose 70 - 99 mg/dL 133   136   160    ?BUN 8 - 23 mg/dL '26   24   29    '$ ?Creatinine 0.61 - 1.24 mg/dL 1.20   1.38    1.20    ?Sodium 135 - 145 mmol/L 138   138  138    ?Potassium 3.5 - 5.1 mmol/L 3.4   3.4   4.0    ?Chloride 98 - 111 mmol/L 105   104   101    ?CO2 22 - 32 mmol/L  15   28    ?Calcium 8.9 - 10.3 mg/dL  9.1   9.6    ?Total Protein 6.5 - 8.1 g/dL   7.2    ?Total Bilirubin 0.3 - 1.2 mg/dL   0.7    ?Alkaline Phos 38 - 126 U/L   60    ?AST 15 - 41 U/L   20    ?ALT 0 - 44 U/L   14    ? ? ?Lab Results  ?Component Value Date  ? INR 1.09 09/23/2015  ? ? ?Imaging: ?CT HEAD WO CONTRAST (5MM) ? ?Result Date: 12/26/2021 ?CLINICAL DATA:  Head trauma, moderate-severe; Polytrauma, blunt. Fall, seizure EXAM: CT HEAD WITHOUT CONTRAST CT CERVICAL SPINE WITHOUT CONTRAST TECHNIQUE: Multidetector CT imaging of the head and cervical spine was performed following the standard protocol without intravenous contrast. Multiplanar CT image reconstructions of the cervical spine were also generated. RADIATION DOSE REDUCTION: This exam was performed according to the departmental dose-optimization program which includes automated exposure control, adjustment of the mA and/or kV according to patient size and/or use of iterative reconstruction technique. COMPARISON:  09/23/2015 FINDINGS: CT HEAD FINDINGS Brain: Normal anatomic configuration. Parenchymal volume loss is commensurate with the patient's age. Moderate periventricular white matter changes are present likely reflecting the sequela of small vessel ischemia, stable since prior examination. Remote lacunar infarct again noted within the right insular cortex no abnormal intra or extra-axial mass lesion or fluid collection. No abnormal mass effect or midline shift. No evidence of acute intracranial hemorrhage or infarct. Stable borderline ventriculomegaly, proportional to the degree of parenchymal atrophy and likely representing ex vacuo dilation. Cerebellum unremarkable. Vascular: No asymmetric hyperdense vasculature at the skull base. Skull: Intact Sinuses/Orbits: Paranasal sinuses are clear.  Ocular lenses have been removed. Orbits are otherwise unremarkable. Other: Mastoid air cells and middle ear cavities are clear. CT CERVICAL SPINE FINDINGS Alignment: 2 mm anterolisthesis C4-5 is likely degen

## 2021-12-26 NOTE — H&P (Signed)
?History and Physical  ? ? ?Adam Swanson. JJO:[id number] DOB: Nov 27, 1927 DOA: 12/26/2021 ? ?PCP: Adam Fess, MD  ?Patient coming from: [facility] ? ?I have personally briefly reviewed patient's old medical records in [facility] ? ?Chief Complaint: Fall with seizure-like activity ? ?HPI: ?Adam Swanson. is a 88 y.o. male with medical history significant for CKD stage IIIa, GERD, BPH, anxiety who presented to the ED for evaluation of seizure-like activity after a fall.  History is supplemented by patient's son at bedside. ? ?Patient currently lives at [facility].  He normally ambulates with the use of a walker outside of his home.  He states that he was walking inside without his walker when all of a sudden he fell to the ground.  He denies any prodrome and states that he did not trip or lose his balance he just went down to the ground.  He states that he did not hit his head.  He was able to push his alarm bell.  When staff arrived they noticed that he appeared to be having generalized tonic-clonic seizure-like activity.  This lasted about 5 minutes.  EMS were called and he was reported to be a postictal state.  Patient denies any prior known history of seizures. ? ?On arrival patient was noted to be in new atrial fibrillation with RVR.  He has no prior known history of atrial fibrillation or other arrhythmia. ? ?ED Course  Labs/Imaging on admission: I have personally reviewed following labs and imaging studies. ? ?Initial vitals showed BP 103/72, pulse 169, RR 30, temp 98.7 ?F, SPO2 initially 94% on 2 L O2 via [place]. ? ?Labs show WBC 9.9, hemoglobin 12.9, platelets 218,000, sodium 138, potassium 3.4, bicarb 15, BUN 24, creatinine 1.38, serum glucose 136, troponin 15. ? ?Portable chest x-ray showed large left pneumothorax, acute left sixth through ninth rib fractures, and left lower lung atelectasis. ? ?Pelvic x-ray negative for evidence of  fracture or diastases. ? ?CT head without contrast negative for acute intracranial abnormality or calvarial fracture.  Remote right insular cortex lacunar infarct noted and appears stable compared to prior exam. ? ?CT cervical spine without contrast negative for acute fracture or listhesis.  Degenerative changes noted. ? ?Patient was given 500 cc normal saline, IV diltiazem 15 mg followed by continuous infusion.  Neurology and trauma surgery were consulted.  Left-sided chest tube was placed by EDP.  Follow-up CXR shows left chest tube placement with complete evacuation of left pneumothorax.  Mild pulmonary hyperinflation noted.  The hospitalist service was consulted to admit for further evaluation and management. ? ?Review of Systems: All systems reviewed and are negative except as documented in history of present illness above. ? ? ?Past Medical History:  ?Diagnosis Date  ? Anxiety   ? Colon polyp   ? hyperplastic  ? Diverticulosis of colon (without mention of hemorrhage)   ? Esophageal stricture   ? GERD (gastroesophageal reflux disease)   ? Hiatal hernia   ? Lower extremity weakness   ? OCD (obsessive compulsive disorder)   ? Viral hepatitis 08/1983  ? ? ?Past Surgical History:  ?Procedure Laterality Date  ? COLONOSCOPY    ? HEMORRHOID SURGERY    ? 1965  ? ? ?Social History: ? reports that he has never smoked. He has never used smokeless tobacco. He reports current alcohol use of about 7.0 standard drinks per week. He reports that he does not use drugs. ? ?Allergies  ?  Allergen Reactions  ? Bee Venom Anaphylaxis  ?  Other reaction(s): Other (See Comments) ?Loss of consciousness  ? Nsaids   ?  Other reaction(s): intol  ? Other Other (See Comments)  ? ? ?Family History  ?Problem Relation Age of Onset  ? Diverticulosis Mother   ? Melanoma Mother   ? Lung cancer Father   ? Colon cancer Other   ?     1st cousion.  ? Allergic rhinitis Neg Hx   ? Angioedema Neg Hx   ? Asthma Neg Hx   ? Eczema Neg Hx   ? Immunodeficiency  Neg Hx   ? Urticaria Neg Hx   ? ? ? ?Prior to Admission medications   ?Medication Sig Start Date End Date Taking? Authorizing Provider  ?demeclocycline (DECLOMYCIN) 150 MG tablet Take 1 tablet (150 mg total) by mouth 2 (two) times daily. 08/21/19   Renato Shin, MD  ?esomeprazole (NEXIUM) 20 MG capsule Take 20 mg by mouth daily at 12 noon.    [provider]  ?furosemide (LASIX) 20 MG tablet TAKE 2 TABLET = 40 MG BY MOUTH ONCE DAILY 05/17/21   Renato Shin, MD  ?furosemide (LASIX) 40 MG tablet Take 1 tablet (40 mg total) by mouth daily. 01/08/20   Renato Shin, MD  ?HYDROcodone-acetaminophen (NORCO/VICODIN) 5-325 MG tablet Take 1 tablet by mouth every 6 (six) hours as needed for moderate pain or severe pain. 06/14/18   Domenic Moras, PA-C  ?LORazepam (ATIVAN) 1 MG tablet Take 1-2 tablets (1-2 mg total) by mouth 2 (two) times daily. Take 1 tablet at noon and 2 tablets at bedtime ?Patient taking differently: Take 2 mg by mouth 2 (two) times daily. Take 2 tablets at bedtime 09/26/15   Bonnielee Haff, MD  ?Multiple Vitamins-Minerals (PRESERVISION AREDS) CAPS Take 1 capsule by mouth 2 (two) times daily.    [provider]  ?polyethylene glycol (MIRALAX / GLYCOLAX) packet Take 17 g by mouth daily.    [provider]  ?sodium chloride (OCEAN) 0.65 % SOLN nasal spray Place 1 spray into both nostrils 3 (three) times daily as needed for congestion.    [provider]  ?sodium chloride 1 g tablet TAKE 4 TABLETS = 4 GM BY MOUTH ONCE DAILY 02/20/21   Renato Shin, MD  ?tamsulosin (FLOMAX) 0.4 MG CAPS capsule Take 0.4 mg by mouth at bedtime.    [provider]  ? ? ?Physical Exam: ?Vitals:  ? 12/26/21 2230 12/26/21 2245 12/26/21 2300 12/26/21 2315  ?BP: 112/85 100/87 102/76 114/83  ?Pulse: (!) 146 (!) 141 84 79  ?Resp: 20 17 (!) 24 19  ?Temp:      ?TempSrc:      ?SpO2: 97% 95% 97% 100%  ?Weight:      ?Height:      ? ?Constitutional: Elderly man resting in bed with head slightly elevated,  NAD, calm, intermittent episodes of left-sided chest pain ?Eyes: PERRL, lids and conjunctivae normal ?ENMT: Mucous membranes are dry. Posterior pharynx clear of any exudate or lesions.Normal dentition.  ?Neck: normal, supple, no masses. ?Respiratory: clear to auscultation bilaterally.left-sided chest tube in place.  Normal respiratory effort with chest tube in place and on 2 L O2 via [place]. No accessory muscle use.  ?Cardiovascular: Regular rate and rhythm, no murmurs / rubs / gallops. No extremity edema. 2+ pedal pulses. ?Abdomen: no tenderness, no masses palpated.  ?Musculoskeletal: no clubbing / cyanosis. No joint deformity upper and lower extremities. Good ROM. Normal muscle tone.  ?Skin: no rashes,  lesions, ulcers. No induration ?Neurologic: Sensation intact. Strength 5/5 in all 4.  ?Psychiatric: Alert and oriented x 3.  Repetitive questioning. ? ?EKG: Personally reviewed. Atrial fibrillation, rate 173, early R wave transition.  A-fib with RVR is new when compared to prior. ? ?Assessment/Plan ?Principal Problem: ?  Atrial fibrillation with rapid ventricular response ([place]) ?Active Problems: ?  Traumatic fracture of ribs with pneumothorax, left, closed, initial encounter ?  Seizure-like activity ([place]) ?  Chronic kidney disease, stage 3a ([place]) ?  Anxiety ?  ?Adam Swanson. is a 88 y.o. male with medical history significant for CKD stage IIIa, GERD, BPH, anxiety who is admitted with seizure-like activity after fall at his ILF found to have left 6th-9th rib fractures with large left pneumothorax s/p chest tube placement.  Also with new onset A-fib with RVR.  Trauma surgery and neurology following. ? ?Assessment and Plan: ?* Atrial fibrillation with rapid ventricular response ([place]) ?New onset atrial fibrillation with RVR present on arrival to ED.  Unclear if A-fib was etiology of his fall or a consequence of his pneumothorax and rib fractures.  He was started on IV diltiazem and has now converted back to sinus  rhythm. ?-DC IV diltiazem ?-Start on Lopressor 12.5 mg twice daily ?-Hold off on full dose anticoagulation at this time as this may have been provoked A-fib ?-Obtain echocardiogram ? ?Traumatic fracture of ribs

## 2021-12-26 NOTE — ED Notes (Signed)
Time out performed. Left chest tube prepared. Family consent obtained by Trifan MD. Patient medicated with 145mg Fentanyl. Remains on [place]- increased from 3LPM to 5LPM for comfort/mild sedation. ?

## 2021-12-27 ENCOUNTER — Inpatient Hospital Stay (HOSPITAL_COMMUNITY): Payer: Medicare Other

## 2021-12-27 DIAGNOSIS — E876 Hypokalemia: Secondary | ICD-10-CM

## 2021-12-27 DIAGNOSIS — R569 Unspecified convulsions: Secondary | ICD-10-CM

## 2021-12-27 DIAGNOSIS — F0390 Unspecified dementia without behavioral disturbance: Secondary | ICD-10-CM

## 2021-12-27 DIAGNOSIS — I4891 Unspecified atrial fibrillation: Secondary | ICD-10-CM | POA: Diagnosis not present

## 2021-12-27 DIAGNOSIS — F419 Anxiety disorder, unspecified: Secondary | ICD-10-CM | POA: Diagnosis not present

## 2021-12-27 DIAGNOSIS — N189 Chronic kidney disease, unspecified: Secondary | ICD-10-CM | POA: Diagnosis not present

## 2021-12-27 DIAGNOSIS — D631 Anemia in chronic kidney disease: Secondary | ICD-10-CM

## 2021-12-27 DIAGNOSIS — N1831 Chronic kidney disease, stage 3a: Secondary | ICD-10-CM | POA: Diagnosis not present

## 2021-12-27 DIAGNOSIS — E871 Hypo-osmolality and hyponatremia: Secondary | ICD-10-CM

## 2021-12-27 LAB — ECHOCARDIOGRAM COMPLETE
AR max vel: 1.95 cm2
AV Peak grad: 10.6 mmHg
Ao pk vel: 1.63 m/s
Area-P 1/2: 3.68 cm2
Calc EF: 51.9 %
Height: 67 in
S' Lateral: 2.2 cm
Single Plane A2C EF: 49.6 %
Single Plane A4C EF: 52.4 %
Weight: 2151.69 oz

## 2021-12-27 LAB — BASIC METABOLIC PANEL
Anion gap: 6 (ref 5–15)
BUN: 23 mg/dL (ref 8–23)
CO2: 24 mmol/L (ref 22–32)
Calcium: 8.6 mg/dL — ABNORMAL LOW (ref 8.9–10.3)
Chloride: 107 mmol/L (ref 98–111)
Creatinine, Ser: 1.07 mg/dL (ref 0.61–1.24)
GFR, Estimated: >60 mL/min (ref >60)
Glucose, Bld: 137 mg/dL — ABNORMAL HIGH (ref 70–99)
Potassium: 4.4 mmol/L (ref 3.5–5.1)
Sodium: 137 mmol/L (ref 135–145)

## 2021-12-27 LAB — MAGNESIUM: Magnesium: 2.1 mg/dL (ref 1.7–2.4)

## 2021-12-27 LAB — CBC
HCT: 34.5 % — ABNORMAL LOW (ref 39.0–52.0)
Hemoglobin: 11.2 g/dL — ABNORMAL LOW (ref 13.0–17.0)
MCH: 29.8 pg (ref 26.0–34.0)
MCHC: 32.5 g/dL (ref 30.0–36.0)
MCV: 91.8 fL (ref 80.0–100.0)
Platelets: 172 10*3/uL (ref 150–400)
RBC: 3.76 MIL/uL — ABNORMAL LOW (ref 4.22–5.81)
RDW: 15.5 % (ref 11.5–15.5)
WBC: 8.2 10*3/uL (ref 4.0–10.5)
nRBC: 0 % (ref 0.0–0.2)

## 2021-12-27 LAB — TROPONIN I (HIGH SENSITIVITY): Troponin I (High Sensitivity): 22 ng/L — ABNORMAL HIGH (ref <18)

## 2021-12-27 MED ORDER — LEVETIRACETAM 500 MG PO TABS
500.0000 mg | ORAL_TABLET | Freq: Two times a day (BID) | ORAL | Status: DC
  Administered 2021-12-27 – 2021-12-28 (×3): 500 mg via ORAL
  Filled 2021-12-27 (×3): qty 1

## 2021-12-27 MED ORDER — METOPROLOL TARTRATE 12.5 MG HALF TABLET
12.5000 mg | ORAL_TABLET | Freq: Two times a day (BID) | ORAL | Status: DC
  Administered 2021-12-27 – 2022-01-02 (×12): 12.5 mg via ORAL
  Filled 2021-12-27 (×15): qty 1

## 2021-12-27 MED ORDER — FUROSEMIDE 40 MG PO TABS
40.0000 mg | ORAL_TABLET | Freq: Every day | ORAL | Status: DC
  Administered 2021-12-28: 40 mg via ORAL
  Filled 2021-12-27: qty 1

## 2021-12-27 MED ORDER — FAMOTIDINE 20 MG PO TABS
20.0000 mg | ORAL_TABLET | Freq: Every evening | ORAL | Status: DC
  Administered 2021-12-27: 20 mg via ORAL
  Filled 2021-12-27: qty 1

## 2021-12-27 MED ORDER — DEMECLOCYCLINE HCL 150 MG PO TABS
150.0000 mg | ORAL_TABLET | Freq: Two times a day (BID) | ORAL | Status: DC
  Administered 2021-12-27 – 2022-01-02 (×12): 150 mg via ORAL
  Filled 2021-12-27 (×13): qty 1

## 2021-12-27 MED ORDER — LORAZEPAM 1 MG PO TABS
1.0000 mg | ORAL_TABLET | Freq: Every day | ORAL | Status: DC
  Administered 2021-12-27 (×2): 1 mg via ORAL
  Filled 2021-12-27 (×2): qty 1

## 2021-12-27 MED ORDER — LIDOCAINE 5 % EX PTCH
1.0000 | MEDICATED_PATCH | CUTANEOUS | Status: DC
  Administered 2021-12-27 – 2022-01-02 (×7): 1 via TRANSDERMAL
  Filled 2021-12-27 (×7): qty 1

## 2021-12-27 MED ORDER — PANTOPRAZOLE SODIUM 40 MG PO TBEC
40.0000 mg | DELAYED_RELEASE_TABLET | Freq: Every day | ORAL | Status: DC
  Administered 2021-12-27 – 2021-12-29 (×3): 40 mg via ORAL
  Filled 2021-12-27 (×3): qty 1

## 2021-12-27 MED ORDER — SODIUM CHLORIDE 1 G PO TABS
2.0000 g | ORAL_TABLET | Freq: Two times a day (BID) | ORAL | Status: DC
  Administered 2021-12-27 – 2022-01-02 (×10): 2 g via ORAL
  Filled 2021-12-27 (×13): qty 2

## 2021-12-27 MED ORDER — HYDROXYZINE HCL 25 MG PO TABS: 25.0000 mg | ORAL_TABLET | Freq: Every evening | ORAL | Status: DC | PRN

## 2021-12-27 NOTE — Progress Notes (Signed)
PT Cancellation Note ? ?Patient Details ?Name: Adam Swanson. ?MRN: [id number] ?DOB: Dec 08, 1927 ? ? ?Cancelled Treatment:    Reason Eval/Treat Not Completed: Other (comment).  Pt was being seen by nursing initially for management of telemetry wires, then was trying to eat and stated to PT that he has no interest in physical therapy.  Follow up and see how pt is managing, try to move if he is willing. ? ? ?Ramond Dial ?12/27/2021, 1:29 PM ? ?Mee Hives, PT PhD ?Acute Rehab Dept. Number: [facility] [phone] and [place] [phone] ? ?

## 2021-12-27 NOTE — Assessment & Plan Note (Addendum)
Regrettably, patient is prescribed Ativan 2 mg PO nightly as an outpatient ?

## 2021-12-27 NOTE — Assessment & Plan Note (Addendum)
-   Avoid benzodiazepines ?

## 2021-12-27 NOTE — Assessment & Plan Note (Addendum)
Na mildly low, at baseline ?-Continue home salt tabs and demeclocycline ?

## 2021-12-27 NOTE — Assessment & Plan Note (Addendum)
-   Consult Neurology ?- Transition to lamotrigine ?- Taper Depakote ? ?

## 2021-12-27 NOTE — Assessment & Plan Note (Addendum)
New onset  ?Treated with IV Diltiazem in the ER and spontaneously converted to NSR ?Echo with EF 50%, normal valves. TSH normal.  ?CHA2DS2-Vasc 4 ?TSH normal, troponin low and flat, doubt ACS, no further ischemic work up needed ?- Continue metoprolol ?- Plan to start Eliquis when depakote discontinued ?

## 2021-12-27 NOTE — ED Notes (Signed)
Transferred to hospital bed. Output of chest tube ~262m from 160 original output. External urinary catheter applied. Patient resting. ?

## 2021-12-27 NOTE — TOC Progression Note (Signed)
Transition of Care (TOC) - Progression Note  ? ? ?Patient Details  ?Name: Adam Swanson. ?MRN: [id number] ?Date of Birth: 1927/11/07 ? ?Transition of Care (TOC) CM/SW Contact  ?Zenon Mayo, RN ?Phone Number: ?12/27/2021, 4:30 PM ? ?Clinical Narrative:    ?rom [employer], who presented after a fall and seizure-like activity.  TOC will continue to follow for dc needs.  ? ? ?  ?  ? ?Expected Discharge Plan and Services ?  ?  ?  ?  ?  ?                ?  ?  ?  ?  ?  ?  ?  ?  ?  ?  ? ? ?Social Determinants of Health (SDOH) Interventions ?  ? ?Readmission Risk Interventions ?   ? View : No data to display.  ?  ?  ?  ? ? ?

## 2021-12-27 NOTE — Progress Notes (Signed)
?  Progress Note ? ? ?Patient: Adam Swanson. TDS:[id number] DOB: 1928/05/04 DOA: 12/26/2021     1 ?DOS: the patient was seen and examined on 12/27/2021 at 9:39 AM ?  ? ? ? ?Brief hospital course: ?Adam Swanson is a 88 y.o. M with CKD stage IIIa, chronic hyponatremia, GERD, BPH, anxiety who presented after a fall and seizure-like activity at his ILF. ? ?Int he ER, found to have left 6th-9th rib fractures with large left pneumothorax s/p chest tube placement.  Also with new onset A-fib with RVR.    ? ? ? ? ?Assessment and Plan: ?* Atrial fibrillation with rapid ventricular response (H[facility]) ?New onset  ?Treatd with IV Diltiazem in the ER and spontaneously reconverted to NSR ?Echo with EF 50%, normal valves. ?CHA2DS2-Vasc only 2, due to age. ?-Continue metoprolol ?- Trend torponin  ?- Check TSH ? ?Traumatic fracture of ribs with pneumothorax, left, closed, initial encounter ?6-9 rib fractures on left with pneumothorax.   ?S/p pigtail chest tube placed by EDP ?- Consult Trauma team ?- Pulmonary toliet ? ?Seizure-like activity ([place]) ?- Consult Neurology ?- Continue Keppra ?- Follow MRI ?- Follow EEG ? ?Chronic kidney disease, stage 3a ([place]) ?  ? ?Dementia without behavioral disturbance ([place]) ?Mild ?- Avoid benzodiazepines ? ?Anxiety ?  ? ?Hyponatremia ?Na mildly low, at baseline ?-Continue home salt tabs and demeclocycline ? ? ? ? ? ? ? ? ? ?Subjective: Patient has no complaints.  He has some mild left-sided chest pain, but no dyspnea, no cough, no hemoptysis, no confusion, no fever. ? ? ? ? ?Physical Exam: ?Vitals:  ? 12/27/21 1200 12/27/21 1230 12/27/21 1240 12/27/21 1359  ?BP: 114/71 110/65 110/65 122/86  ?Pulse: 66 62 71 68  ?Resp: '16 14 17 20  '$ ?Temp:   97.7 ?F (36.5 ?C) 98.2 ?F (36.8 ?C)  ?TempSrc:   Oral Oral  ?SpO2: 97% 95% 97% 90%  ?Weight:      ?Height:      ? ?Elderly adult male, lying in bed, interactive ?RRR, no murmurs, no peripheral edema ?Reduced subcutaneous muscle mass and fat, noted to formation of the large  joints of the upper or lower extremities bilaterally ?Attention normal, affect blunted, judgment insight appear mildly impaired, speech fluent, moves upper extremities with generalized weakness but symmetric strength ?Respiratory effort normal, lungs clear without rales or wheezes ? ?Data Reviewed: ?Discussed with trauma surgery.  Nursing notes reviewed, vital signs reviewed. ?Neurology notes reviewed. ?Labs are notable for minimal elevation of troponin ?Potassium normalized ?Patient metabolic panel normal ?Complete blood count notable for mild anemia ? ?Family Communication:   ? ? ? ?Disposition: ?Status is: Inpatient ?Remains inpatient appropriate because:  ?He presented with multiple rib fractures, and requires chest tube for pneumothorax ? ? ? ? ? ? ? ? ? ? ? ?Author: ?Edwin Dada, MD ?12/27/2021 3:02 PM ? ?For on call review [email or web address].  ? ? ?

## 2021-12-27 NOTE — Hospital Course (Addendum)
Adam Swanson is a 88 y.o. M with CKD stage IIIa, chronic hyponatremia, GERD, BPH, anxiety who presented after a fall and seizure-like activity at his ILF. ? ?Int he ER, found to have left 6th-9th rib fractures with large left pneumothorax s/p chest tube placement.  Also with new onset A-fib with RVR.    ? ? ?4/18: Admitted, chest tube placed, HR controlled with diltiazem, Neurology consulted for seizure, Trauma consulted for rib fx and PTX ?4/19: Delirious at night ?4/20: Remains delirious ?4/21: Central acting agents adjusted, more sleepy today, not agitated ?4/22: Now alert, MRI brain shows stroke ?4/23: Pneumothorax reoccurred, and delirious again ?4/24: PTX improving spontaneously, mentation better ?

## 2021-12-27 NOTE — Consult Note (Signed)
Neurology Consultation ?Reason for Consult: Seizure ?Referring Physician: Sherrye Payor ? ?CC: Seizure ? ?History is obtained from: Patient ? ?HPI: Adam Swanson. is a 88 y.o. male with a history of mild short-term memory loss (per son) who fell this evening.  He remembers falling, and members crawling afterwards having hit his left side.  He states that he has had problems with his legs going out for a while, as in the past 10 years.  He then apparently had a seizure.  It was reported last about 5 minutes.  He was postictal by report.  He has since improved and is currently back to baseline.  His son denies any episodes of unresponsiveness in the past, no history of seizures, no history of staring spells or other episodes of concern. ? ?In the emergency department, he was found to have pneumothorax as well as atrial fibrillation and is being admitted to internal medicine. ? ? ?ROS: A 14 point ROS was performed and is negative except as noted in the HPI.  ?Past Medical History:  ?Diagnosis Date  ? Anxiety   ? Colon polyp   ? hyperplastic  ? Diverticulosis of colon (without mention of hemorrhage)   ? Esophageal stricture   ? GERD (gastroesophageal reflux disease)   ? Hiatal hernia   ? Lower extremity weakness   ? OCD (obsessive compulsive disorder)   ? Viral hepatitis 08/1983  ? ? ? ?Family History  ?Problem Relation Age of Onset  ? Diverticulosis Mother   ? Melanoma Mother   ? Lung cancer Father   ? Colon cancer Other   ?     1st cousion.  ? Allergic rhinitis Neg Hx   ? Angioedema Neg Hx   ? Asthma Neg Hx   ? Eczema Neg Hx   ? Immunodeficiency Neg Hx   ? Urticaria Neg Hx   ? ? ? ?Social History:  reports that he has never smoked. He has never used smokeless tobacco. He reports current alcohol use of about 7.0 standard drinks per week. He reports that he does not use drugs. ? ? ?Exam: ?Current vital signs: ?BP (!) 126/94   Pulse 79   Temp 98.7 ?F (37.1 ?C) (Oral)   Resp 19   Ht '5\' 7"'$  (1.702 m)   Wt 61 kg   SpO2  100%   BMI 21.06 kg/m?  ?Vital signs in last 24 hours: ?Temp:  [98.7 ?F (37.1 ?C)] 98.7 ?F (37.1 ?C) (04/18 2052) ?Pulse Rate:  [35-160] 79 (04/18 2315) ?Resp:  [17-35] 19 (04/18 2315) ?BP: (100-165)/(50-94) 126/94 (04/19 0013) ?SpO2:  [90 %-100 %] 100 % (04/18 2315) ?Weight:  [61 kg] 61 kg (04/18 2216) ? ? ?Physical Exam  ?Constitutional: Appears well-developed and well-nourished.  ?Psych: Affect appropriate to situation ?Eyes: No scleral injection ?HENT: No OP obstruction ?MSK: no joint deformities.  ?Cardiovascular: Normal rate and regular rhythm.  ?Respiratory: Effort normal, non-labored breathing ?GI: Soft.  No distension. There is no tenderness.  ?Skin: WDI ? ?Neuro: ?Mental Status: ?Patient is awake, alert, oriented to person, place, gives month as March, but corrects himself to April, year, and situation. ?Patient is able to give a clear and coherent history. ?No signs of aphasia or neglect ?Cranial Nerves: ?II: Visual Fields are full. Pupils are equal, round, and reactive to light.   ?III,IV, VI: EOMI without ptosis or diploplia.  ?V: Facial sensation is symmetric to temperature ?VII: Facial movement is symmetric.  ?VIII: hearing is intact to voice ?X: Uvula elevates  symmetrically ?XI: Shoulder shrug is symmetric. ?XII: tongue is midline without atrophy or fasciculations.  ?Motor: ?Tone is normal. Bulk is normal. 5/5 strength was present in the right arm and bilateral legs, he has limitations of the left arm related to his recent injuries, but good grip strength. ?Sensory: ?Sensation is symmetric to light touch and temperature in the arms and legs. ?Cerebellar: ?No clear ataxia ? ?I have reviewed labs in epic and the results pertinent to this consultation are: ?Creatinine 1.38 ? ?I have reviewed the images obtained: CT head-negative ? ?Impression: 88 year old male with new onset seizure.  Though there was initial reports of hitting his head, the patient denies it and there is no evidence of it on physical  exam.  Whether he had a head injury or not, I would favor starting antiepileptics at this time.  He will need further evaluation with MRI and EEG.  If both are negative, could consider discontinuing antiepileptics at some point in the future but at least in the intermediate term I would continue them. ? ?Recommendations: ?1) Keppra 500 mg twice daily ?2) MRI brain ?3) EEG ? ?Roland Rack, MD ?[facility] ?[phone] ? ?If 7pm- 7am, please page neurology on call as listed in [place]. ? ?

## 2021-12-27 NOTE — Progress Notes (Signed)
Trauma Event Note ? ? ?TRN at bedside to round. Patient alert and oriented, wanting to know when people in the hall will stop working so he can sleep. States "this is a hospital, people come here to rest." Chest tube in place to wall suction, no drainage to dressing. [place] 2L O2. No needs at this time. ? ?Last imported Vital Signs ?BP 104/78 (BP Location: Left Arm)   Pulse 70   Temp 98 ?F (36.7 ?C) (Oral)   Resp 18   Ht '5\' 7"'$  (1.702 m)   Wt 134 lb 7.7 oz (61 kg)   SpO2 90%   BMI 21.06 kg/m?  ? ?Trending CBC ?Recent Labs  ?  12/26/21 ?2052 12/26/21 ?2057 12/27/21 ?0446  ?WBC 9.9  --  8.2  ?HGB 12.9* 13.9 11.2*  ?HCT 40.2 41.0 34.5*  ?PLT 218  --  172  ? ? ?Trending Coag's ?No results for input(s): APTT, INR in the last 72 hours. ? ?Trending BMET ?Recent Labs  ?  12/26/21 ?2052 12/26/21 ?2057 12/27/21 ?0446  ?NA 138 138 137  ?K 3.4* 3.4* 4.4  ?CL 104 105 107  ?CO2 15*  --  24  ?BUN 24* 26* 23  ?CREATININE 1.38* 1.20 1.07  ?GLUCOSE 136* 133* 137*  ? ? ? ? ?[place]  ?Trauma Response RN ? ?Please call [facility] at [phone] for further assistance. ? ? ?  ?

## 2021-12-27 NOTE — Progress Notes (Signed)
EEG complete - results pending 

## 2021-12-27 NOTE — ED Notes (Signed)
Introduced self to pt - Pt educated to call light.  ?Chest tube remains in place, output up to 250.  ?Pt does not wish to be covered up at this time  ?

## 2021-12-27 NOTE — Progress Notes (Signed)
? ?Progress Note ? ?   ?Subjective: ?Pt reports pain in back and would like help eating breakfast. He reports his son left but will be back later in the day.  ? ?Objective: ?Vital signs in last 24 hours: ?Temp:  [98.7 ?F (37.1 ?C)] 98.7 ?F (37.1 ?C) (04/18 2052) ?Pulse Rate:  [35-160] 62 (04/19 0630) ?Resp:  [12-35] 12 (04/19 0630) ?BP: (100-165)/(50-96) 111/76 (04/19 0630) ?SpO2:  [90 %-100 %] 100 % (04/19 0630) ?Weight:  [61 kg] 61 kg (04/18 2216) ?  ? ?Intake/Output from previous day: ?04/18 0701 - 04/19 0700 ?In: 47.8 [I.V.:47.8] ?Out: 160 [Chest Tube:160] ?Intake/Output this shift: ?No intake/output data recorded. ? ?PE: ?General: pleasant, WD, elderly male who is laying in bed in NAD ?Heart: regular, rate, and rhythm.  Palpable radial and pedal pulses bilaterally ?Lungs: CTAB, no wheezes, rhonchi, or rales noted.  Respiratory effort nonlabored. L CT in place without air leak, bloody drainage in sahara ?Abd: soft, NT, ND ?MS: all 4 extremities are symmetrical with no cyanosis, clubbing, or edema. ?Skin: warm and dry with no masses, lesions, or rashes ?Neuro: follows commands, moving all extremities ?Psych: seems somewhat confused ? ? ?Lab Results:  ?Recent Labs  ?  12/26/21 ?2052 12/26/21 ?2057 12/27/21 ?0446  ?WBC 9.9  --  8.2  ?HGB 12.9* 13.9 11.2*  ?HCT 40.2 41.0 34.5*  ?PLT 218  --  172  ? ?BMET ?Recent Labs  ?  12/26/21 ?2052 12/26/21 ?2057 12/27/21 ?0446  ?NA 138 138 137  ?K 3.4* 3.4* 4.4  ?CL 104 105 107  ?CO2 15*  --  24  ?GLUCOSE 136* 133* 137*  ?BUN 24* 26* 23  ?CREATININE 1.38* 1.20 1.07  ?CALCIUM 9.1  --  8.6*  ? ?PT/INR ?No results for input(s): LABPROT, INR in the last 72 hours. ?CMP  ?   ?Component Value Date/Time  ? NA 137 12/27/2021 0446  ? K 4.4 12/27/2021 0446  ? CL 107 12/27/2021 0446  ? CO2 24 12/27/2021 0446  ? GLUCOSE 137 (H) 12/27/2021 0446  ? BUN 23 12/27/2021 0446  ? CREATININE 1.07 12/27/2021 0446  ? CALCIUM 8.6 (L) 12/27/2021 0446  ? PROT 7.2 12/14/2021 1443  ? ALBUMIN 4.0  12/14/2021 1443  ? AST 20 12/14/2021 1443  ? ALT 14 12/14/2021 1443  ? ALKPHOS 60 12/14/2021 1443  ? BILITOT 0.7 12/14/2021 1443  ? GFRNONAA >60 12/27/2021 0446  ? GFRAA >60 11/06/2015 0933  ? ?Lipase  ?   ?Component Value Date/Time  ? LIPASE 36 12/14/2021 1443  ? ? ? ? ? ?Studies/Results: ?CT HEAD WO CONTRAST (5MM) ? ?Result Date: 12/26/2021 ?CLINICAL DATA:  Head trauma, moderate-severe; Polytrauma, blunt. Fall, seizure EXAM: CT HEAD WITHOUT CONTRAST CT CERVICAL SPINE WITHOUT CONTRAST TECHNIQUE: Multidetector CT imaging of the head and cervical spine was performed following the standard protocol without intravenous contrast. Multiplanar CT image reconstructions of the cervical spine were also generated. RADIATION DOSE REDUCTION: This exam was performed according to the departmental dose-optimization program which includes automated exposure control, adjustment of the mA and/or kV according to patient size and/or use of iterative reconstruction technique. COMPARISON:  09/23/2015 FINDINGS: CT HEAD FINDINGS Brain: Normal anatomic configuration. Parenchymal volume loss is commensurate with the patient's age. Moderate periventricular white matter changes are present likely reflecting the sequela of small vessel ischemia, stable since prior examination. Remote lacunar infarct again noted within the right insular cortex no abnormal intra or extra-axial mass lesion or fluid collection. No abnormal mass effect or midline shift.  No evidence of acute intracranial hemorrhage or infarct. Stable borderline ventriculomegaly, proportional to the degree of parenchymal atrophy and likely representing ex vacuo dilation. Cerebellum unremarkable. Vascular: No asymmetric hyperdense vasculature at the skull base. Skull: Intact Sinuses/Orbits: Paranasal sinuses are clear. Ocular lenses have been removed. Orbits are otherwise unremarkable. Other: Mastoid air cells and middle ear cavities are clear. CT CERVICAL SPINE FINDINGS Alignment: 2 mm  anterolisthesis C4-5 is likely degenerative in nature. Otherwise normal cervical lordosis. Skull base and vertebrae: Craniocervical alignment is normal. Atlantodental interval is not widened. No acute fracture of the cervical spine. Vertebral body height is preserved. Soft tissues and spinal canal: No prevertebral fluid or swelling. No visible canal hematoma. Disc levels: There is intervertebral disc space narrowing and endplate remodeling at W0-9 and C5-C7 in keeping with changes of advanced degenerative disc disease. Prevertebral soft tissues are not thickened. Spinal canal is widely patent. There is multilevel uncovertebral and facet arthrosis resulting in multilevel mild-to-moderate neuroforaminal narrowing, most severe bilaterally at C3-4. Upper chest: Left pneumothorax is partially visualized. Other: None IMPRESSION: No acute intracranial abnormality.  No calvarial fracture. Moderate senescent change. Remote right insular cortex lacunar infarct. These findings appears stable since prior examination of 09/23/2015. No acute fracture or listhesis of the cervical spine. Degenerative changes as outlined above. Left pneumothorax, incompletely visualized. Electronically Signed   By: Fidela Salisbury M.D.   On: 12/26/2021 21:55  ? ?CT Cervical Spine Wo Contrast ? ?Result Date: 12/26/2021 ?CLINICAL DATA:  Head trauma, moderate-severe; Polytrauma, blunt. Fall, seizure EXAM: CT HEAD WITHOUT CONTRAST CT CERVICAL SPINE WITHOUT CONTRAST TECHNIQUE: Multidetector CT imaging of the head and cervical spine was performed following the standard protocol without intravenous contrast. Multiplanar CT image reconstructions of the cervical spine were also generated. RADIATION DOSE REDUCTION: This exam was performed according to the departmental dose-optimization program which includes automated exposure control, adjustment of the mA and/or kV according to patient size and/or use of iterative reconstruction technique. COMPARISON:   09/23/2015 FINDINGS: CT HEAD FINDINGS Brain: Normal anatomic configuration. Parenchymal volume loss is commensurate with the patient's age. Moderate periventricular white matter changes are present likely reflecting the sequela of small vessel ischemia, stable since prior examination. Remote lacunar infarct again noted within the right insular cortex no abnormal intra or extra-axial mass lesion or fluid collection. No abnormal mass effect or midline shift. No evidence of acute intracranial hemorrhage or infarct. Stable borderline ventriculomegaly, proportional to the degree of parenchymal atrophy and likely representing ex vacuo dilation. Cerebellum unremarkable. Vascular: No asymmetric hyperdense vasculature at the skull base. Skull: Intact Sinuses/Orbits: Paranasal sinuses are clear. Ocular lenses have been removed. Orbits are otherwise unremarkable. Other: Mastoid air cells and middle ear cavities are clear. CT CERVICAL SPINE FINDINGS Alignment: 2 mm anterolisthesis C4-5 is likely degenerative in nature. Otherwise normal cervical lordosis. Skull base and vertebrae: Craniocervical alignment is normal. Atlantodental interval is not widened. No acute fracture of the cervical spine. Vertebral body height is preserved. Soft tissues and spinal canal: No prevertebral fluid or swelling. No visible canal hematoma. Disc levels: There is intervertebral disc space narrowing and endplate remodeling at W1-1 and C5-C7 in keeping with changes of advanced degenerative disc disease. Prevertebral soft tissues are not thickened. Spinal canal is widely patent. There is multilevel uncovertebral and facet arthrosis resulting in multilevel mild-to-moderate neuroforaminal narrowing, most severe bilaterally at C3-4. Upper chest: Left pneumothorax is partially visualized. Other: None IMPRESSION: No acute intracranial abnormality.  No calvarial fracture. Moderate senescent change. Remote right insular cortex lacunar infarct.  These findings  appears stable since prior examination of 09/23/2015. No acute fracture or listhesis of the cervical spine. Degenerative changes as outlined above. Left pneumothorax, incompletely visualized. Electronically Signed

## 2021-12-27 NOTE — Procedures (Signed)
Patient Name: Adam Swanson.  ?MRN: [id number]  ?Epilepsy Attending: Lora Havens  ?Referring Physician/Provider: Edwin Dada, MD ?Date: 12/27/2021 ?Duration: 22.36 mins ? ?Patient history: 88 year old male with new onset seizure. EEG to evaluate for seizure ? ?Level of alertness: Awake ? ?AEDs during EEG study: LEV ? ?Technical aspects: This EEG study was done with scalp electrodes positioned according to the 10-20 International system of electrode placement. Electrical activity was acquired at a sampling rate of '500Hz'$  and reviewed with a high frequency filter of '70Hz'$  and a low frequency filter of '1Hz'$ . EEG data were recorded continuously and digitally stored.  ? ?Description: The posterior dominant rhythm consists of 8 Hz activity of moderate voltage (25-35 uV) seen predominantly in posterior head regions, symmetric and reactive to eye opening and eye closing. Hyperventilation and photic stimulation were not performed.    ? ?IMPRESSION: ?This study is within normal limits. No seizures or epileptiform discharges were seen throughout the recording. ? ?Lora Havens  ? ?

## 2021-12-27 NOTE — ED Notes (Signed)
Pt restless in bed. Reporting that he needs to pee. Pt reminded that he has condom cath on, pt wanting to stand at bed - Pt assisted in sliding to edge of bed, pt used condom cath. Pt laid back in bed, placed back into gown and on monitor. ?

## 2021-12-27 NOTE — Assessment & Plan Note (Addendum)
Cr at baseline 1.1  ?

## 2021-12-27 NOTE — Plan of Care (Signed)
  Problem: Education: Goal: Knowledge of General Education information will improve Description Including pain rating scale, medication(s)/side effects and non-pharmacologic comfort measures Outcome: Progressing   Problem: Health Behavior/Discharge Planning: Goal: Ability to manage health-related needs will improve Outcome: Progressing   

## 2021-12-27 NOTE — Assessment & Plan Note (Addendum)
Chest tube removed two days ago. ?yesterday, imaging showed recurrence, so placed on O2, given IS and yesterday evening was improved, this morning, CXR shows improvement again, small PTX only. ? ?I agree with Trauma, likely resolved, no furhter CXR unless symptoms of pain/dyspnea or hypoxia.   ?- Plan for chest film repeat in 2-3 weeks  ?

## 2021-12-28 ENCOUNTER — Inpatient Hospital Stay (HOSPITAL_COMMUNITY): Payer: Medicare Other

## 2021-12-28 DIAGNOSIS — F419 Anxiety disorder, unspecified: Secondary | ICD-10-CM | POA: Diagnosis not present

## 2021-12-28 DIAGNOSIS — S2242XA Multiple fractures of ribs, left side, initial encounter for closed fracture: Secondary | ICD-10-CM

## 2021-12-28 DIAGNOSIS — N189 Chronic kidney disease, unspecified: Secondary | ICD-10-CM | POA: Diagnosis not present

## 2021-12-28 DIAGNOSIS — S270XXA Traumatic pneumothorax, initial encounter: Principal | ICD-10-CM

## 2021-12-28 DIAGNOSIS — R569 Unspecified convulsions: Secondary | ICD-10-CM | POA: Diagnosis not present

## 2021-12-28 DIAGNOSIS — R410 Disorientation, unspecified: Secondary | ICD-10-CM

## 2021-12-28 DIAGNOSIS — I4891 Unspecified atrial fibrillation: Secondary | ICD-10-CM | POA: Diagnosis not present

## 2021-12-28 DIAGNOSIS — F0390 Unspecified dementia without behavioral disturbance: Secondary | ICD-10-CM | POA: Diagnosis not present

## 2021-12-28 LAB — TROPONIN I (HIGH SENSITIVITY): Troponin I (High Sensitivity): 53 ng/L — ABNORMAL HIGH (ref <18)

## 2021-12-28 LAB — BASIC METABOLIC PANEL
Anion gap: 6 (ref 5–15)
BUN: 16 mg/dL (ref 8–23)
CO2: 23 mmol/L (ref 22–32)
Calcium: 8.3 mg/dL — ABNORMAL LOW (ref 8.9–10.3)
Chloride: 103 mmol/L (ref 98–111)
Creatinine, Ser: 1.17 mg/dL (ref 0.61–1.24)
GFR, Estimated: 58 mL/min — ABNORMAL LOW (ref >60)
Glucose, Bld: 86 mg/dL (ref 70–99)
Potassium: 3.6 mmol/L (ref 3.5–5.1)
Sodium: 132 mmol/L — ABNORMAL LOW (ref 135–145)

## 2021-12-28 LAB — TSH: TSH: 3.418 u[IU]/mL (ref 0.350–4.500)

## 2021-12-28 MED ORDER — LORAZEPAM 2 MG/ML IJ SOLN
1.0000 mg | Freq: Once | INTRAMUSCULAR | Status: AC
Start: 2021-12-28 — End: 2021-12-28
  Administered 2021-12-28: 1 mg via INTRAVENOUS
  Filled 2021-12-28: qty 1

## 2021-12-28 MED ORDER — LORAZEPAM 0.5 MG PO TABS
0.5000 mg | ORAL_TABLET | Freq: Every day | ORAL | Status: DC
  Administered 2021-12-28 – 2021-12-31 (×4): 0.5 mg via ORAL
  Filled 2021-12-28 (×4): qty 1

## 2021-12-28 MED ORDER — HALOPERIDOL 1 MG PO TABS
2.0000 mg | ORAL_TABLET | Freq: Every day | ORAL | Status: DC
  Administered 2021-12-28: 2 mg via ORAL
  Filled 2021-12-28: qty 2

## 2021-12-28 NOTE — Progress Notes (Signed)
?Progress Note ? ? ?Patient: Adam Swanson. GEZ:[id number] DOB: 13-Mar-1928 DOA: 12/26/2021     2 ?DOS: the patient was seen and examined on 12/28/2021 at 10:20AM ?  ? ? ? ?Brief hospital course: ?Mr. Thumm is a 88 y.o. M with CKD stage IIIa, chronic hyponatremia, GERD, BPH, anxiety who presented after a fall and seizure-like activity at his ILF. ? ?Int he ER, found to have left 6th-9th rib fractures with large left pneumothorax s/p chest tube placement.  Also with new onset A-fib with RVR.    ? ? ?4/18: Admitted, chest tube placed, HR controlled with diltiazem, Neurology consulted for seizure, Trauma consulted for rib fx and PTX ?4/19: Delirious at night ?4/20: Remains delirious ? ? ? ? ?Assessment and Plan: ?* Atrial fibrillation with rapid ventricular response (HCC) ?New onset  ?Treated with IV Diltiazem in the ER and spontaneously reconverted to NSR ?Remains in NSR ?Echo with EF 50%, normal valves. ?CHA2DS2-Vasc only 2, due to age. ?TSH normal, troponin low and flat, doubt ACS, no further ischemic work up needed ?- Continue metoprolol ?- Hold anticoagulation for now ? ?Traumatic fracture of ribs with pneumothorax, left, closed, initial encounter ?6-9 rib fractures on left with pneumothorax.   ?S/p pigtail chest tube placed by EDP ?CXR this morning looks good. ?- Consult Trauma team ?- Pulmonary toliet ? ?Seizure-like activity ([place]) ?EEG unremarkable.  Overnight delirious and this morning more dysarthric.   ?Patient refusing MRI, complicated by delirium ?- Consult Neurology ?- Continue Keppra ?- Follow MRI  ? ?Chronic kidney disease, stage 3a ([place]) ?Cr at baseline 1.1  ? ?Delirium ?Patient disoriented and agitated overnight.  Delirium in the setting of polypharmacy, Ativan use, pain medication and pain. ?Delirium precautions:  ? -Lights and TV off, minimize interruptions at night ? -Blinds open and lights on during day ? -Glasses/hearing aid with patient ? -Frequent reorientation ? -PT/OT when able ? -Avoid  sedation medications/Beers list medications ?  ?- Replace night time ativan with scheduled PO Haldol ? ?Hypokalemia ?- Supplement K ? ?Dementia without behavioral disturbance ([place]) ?- Avoid benzodiazepines ? ?Anemia in chronic kidney disease (CKD) ?Hgb at baseline, no clinical bleeding ? ?Anxiety ?Regrettably, patient is prescribed Ativan 2 mg PO nightly as an outpatient ? ?Hyponatremia ?Na mildly low, at baseline ?-Continue home salt tabs and demeclocycline ? ? ? ? ? ? ? ? ? ?Subjective: Patient agitated and combative overnight.  This morning he remains delirious on my exam, but denies chest pain, dyspnea, acute complaint.  Is asking for his phone. ? ? ? ? ?Physical Exam: ?Vitals:  ? 12/28/21 0017 12/28/21 0437 12/28/21 0500 12/28/21 0730  ?BP: 117/86 127/79  (!) 148/87  ?Pulse: 69 80  80  ?Resp: '18 18  18  '$ ?Temp: 98.5 ?F (36.9 ?C)   97.9 ?F (36.6 ?C)  ?TempSrc: Oral   Oral  ?SpO2: 92% 95%  93%  ?Weight: 57.8 kg  57.8 kg   ?Height:      ? ?Thin elderly male, chronic right eyelid droop, tangled in his bed sheets, alert, presents, answers questions but is not oriented or focused on conversation ?RRR, no murmurs, no peripheral edema ?Respiratory effort is shallow, but no rales or wheezes are appreciated, respiratory effort appears normal ?Abdomen soft no tenderness palpation or guarding ?Attention blunted, he appears somewhat sedated, likely from the IV opiate and IV Ativan doses he was given overnight, he is tangled in the bed sheets, moves upper extremities with symmetric strength, speech is speech is somewhat  thick, slightly more than yesterday, hard to tell if this is from sedation or new stroke ? ? ? ? ? ? ?Data Reviewed: ?Discussed with neurology, nursing notes reviewed, vital signs reviewed ?EEG report reviewed ?TSH normal ?Creatinine stable at 1.1 ?Sodium down to 132 ?Echocardiogram with EF 50 to 55% ? ?Family Communication:   ? ? ? ?Disposition: ?Status is: Inpatient ?The patient continues to have a chest  tube for his pneumothorax and multiple rib fractures. ? ?He will need MRI brain to rule out stroke, and chest tube removed prior to discharge ? ? ?As he is currently quite delirious, both of these will be difficult to achieve in the next couple days ? ? ? ? ? ? ? ? ? ? ? ?Author: ?Edwin Dada, MD ?12/28/2021 1:52 PM ? ?For on call review [email or web address].  ? ? ?

## 2021-12-28 NOTE — Evaluation (Signed)
Occupational Therapy Evaluation ?Patient Details ?Name: Adam Swanson. ?MRN: [id number] ?DOB: 12/18/1927 ?Today's Date: 12/28/2021 ? ? ?History of Present Illness Adam Swanson. is a 88 y.o. male admitted after an unwitnessed fall and questionable seizure activity. Admission complicated by agitation, confusion and progressive dysarthria. EEG negative for seizures. MRI pending. PMH: CKD, BPH, GERD, anxiety.  ? ?Clinical Impression ?   ?Per chart, pt from [facility] and typically ambulatory with RW. Pt unable to provide further PLOF information due to impaired cognition. Pt overall Total A x 2 for bed mobility and required up to Max A to maintain sitting balance due to posterior lean. Pt requires Max-Total A for ADLs, including feeding. Unable to progress OOB today due to cognition and poor sitting balance. Recommend DC to [facility] rehab prior to return to ILF.  ?   ? ?Recommendations for follow up therapy are one component of a multi-disciplinary discharge planning process, led by the attending physician.  Recommendations may be updated based on patient status, additional functional criteria and insurance authorization.  ? ?Follow Up Recommendations ? Skilled nursing-short term rehab (<3 hours/day)  ?  ?Assistance Recommended at Discharge Frequent or constant Supervision/Assistance  ?Patient can return home with the following Two people to help with walking and/or transfers;A lot of help with bathing/dressing/bathroom ? ?  ?Functional Status Assessment ? Patient has had a recent decline in their functional status and demonstrates the ability to make significant improvements in function in a reasonable and predictable amount of time.  ?Equipment Recommendations ? Other (comment) (TBD pending progress)  ?  ?Recommendations for Other Services   ? ? ?  ?Precautions / Restrictions Precautions ?Precautions: Fall;Other (comment) ?Precaution Comments: chest tube ?Restrictions ?Weight Bearing Restrictions: No  ? ?   ? ?Mobility Bed Mobility ?Overal bed mobility: Needs Assistance ?Bed Mobility: Supine to Sit, Sit to Supine ?  ?  ?Supine to sit: Total assist, +2 for physical assistance, +2 for safety/equipment, HOB elevated ?Sit to supine: Total assist, +2 for physical assistance, +2 for safety/equipment ?  ?General bed mobility comments: minimal initiation but did reach out hands and requested "pull me up". Total A x 2 to return to supine ?  ? ?Transfers ?  ?  ?  ?  ?  ?  ?  ?  ?  ?General transfer comment: unable d/t poor sitting balance ?  ? ?  ?Balance Overall balance assessment: Needs assistance ?Sitting-balance support: Bilateral upper extremity supported, Feet supported ?Sitting balance-Leahy Scale: Zero ?Sitting balance - Comments: initially Min A progressing to Max A to maintain balance with heavy posterior lean, actively pushing back ?Postural control: Posterior lean ?  ?  ?  ?  ?  ?  ?  ?  ?  ?  ?  ?  ?  ?  ?   ? ?ADL either performed or assessed with clinical judgement  ? ?ADL Overall ADL's : Needs assistance/impaired ?Eating/Feeding: Maximal assistance;Bed level ?Eating/Feeding Details (indicate cue type and reason): to drink from ensure cup w/ straw. attempting to initiate but unable to grasp effectively and bring to mouth ?Grooming: Maximal assistance;Bed level;Sitting ?  ?Upper Body Bathing: Total assistance;Sitting ?  ?Lower Body Bathing: Total assistance;Bed level ?  ?Upper Body Dressing : Total assistance;Sitting ?  ?Lower Body Dressing: Total assistance;Bed level ?  ?  ?  ?Toileting- Clothing Manipulation and Hygiene: Total assistance;Bed level ?  ?  ?  ?  ?General ADL Comments: Impaired primarily by cognition, requiring extensive assist  for daily tasks. also with progressive posterior lean when sitting EOB  ? ? ? ?Vision Ability to See in Adequate Light: 1 Impaired ?Patient Visual Report: Other (comment) (to be further assessed) ?Vision Assessment?: Vision impaired- to be further tested in functional  context ?Additional Comments: to be further assessed, appeared to keep R eye closed more than L eye. difficult to assess due to impaired cognition  ?   ?Perception   ?  ?Praxis   ?  ? ?Pertinent Vitals/Pain Pain Assessment ?Pain Assessment: Faces ?Faces Pain Scale: Hurts little more ?Pain Location: back ?Pain Descriptors / Indicators: Grimacing, Guarding, Moaning ?Pain Intervention(s): Monitored during session, Limited activity within patient's tolerance  ? ? ? ?Hand Dominance   ?  ?Extremity/Trunk Assessment Upper Extremity Assessment ?Upper Extremity Assessment: Difficult to assess due to impaired cognition ?  ?Lower Extremity Assessment ?Lower Extremity Assessment: Defer to PT evaluation ?  ?Cervical / Trunk Assessment ?Cervical / Trunk Assessment: Kyphotic ?  ?Communication Communication ?Communication: Expressive difficulties;Other (comment) (dysarthric) ?  ?Cognition Arousal/Alertness: Awake/alert ?Behavior During Therapy: Flat affect ?Overall Cognitive Status: No family/caregiver present to determine baseline cognitive functioning ?  ?  ?  ?  ?  ?  ?  ?  ?  ?  ?  ?  ?  ?  ?  ?  ?General Comments: per chart, hx of STM deficits. Pt with ativan given overnight, inconsistently following commands/answering questions. focused attention, A&Ox1 ?  ?  ?General Comments  SpO2 89-90% on RA. BP stable sitting EOB. RN present during session, trialing removal of wrist restraints ? ?  ?Exercises   ?  ?Shoulder Instructions    ? ? ?Home Living Family/patient expects to be discharged to:: Other (Comment) ?  ?  ?  ?  ?  ?  ?  ?  ?  ?  ?  ?  ?  ?  ?  ?  ?Additional Comments: From [facility] ?  ? ?  ?Prior Functioning/Environment Prior Level of Function : Patient poor historian/Family not available ?  ?  ?  ?  ?  ?  ?Mobility Comments: per chart, ambulatory with walker. Pt appeared more familiar with [facility] than RW on eval but unable to provide info ?ADLs Comments: Unable to provide info for ADL PLOF. If from ILF, presumed  that pt able to complete ADLs or needed light assist at most ?  ? ?  ?  ?OT Problem List: Decreased strength;Impaired balance (sitting and/or standing);Decreased activity tolerance;Decreased coordination;Decreased safety awareness;Decreased cognition ?  ?   ?OT Treatment/Interventions: Self-care/ADL training;Therapeutic exercise;Energy conservation;DME and/or AE instruction;Therapeutic activities;Balance training;Patient/family education  ?  ?OT Goals(Current goals can be found in the care plan section) Acute Rehab OT Goals ?Patient Stated Goal: get some chocolate milk ?OT Goal Formulation: Patient unable to participate in goal setting ?Time For Goal Achievement: 01/11/22 ?Potential to Achieve Goals: Fair  ?OT Frequency: Min 2X/week ?  ? ?Co-evaluation PT/OT/SLP Co-Evaluation/Treatment: Yes ?Reason for Co-Treatment: Necessary to address cognition/behavior during functional activity;For patient/therapist safety;To address functional/ADL transfers ?  ?OT goals addressed during session: ADL's and self-care;Strengthening/ROM ?  ? ?  ?AM-PAC OT "6 Clicks" Daily Activity     ?Outcome Measure Help from another person eating meals?: A Lot ?Help from another person taking care of personal grooming?: A Lot ?Help from another person toileting, which includes using toliet, bedpan, or urinal?: Total ?Help from another person bathing (including washing, rinsing, drying)?: Total ?Help from another person to put on and taking off regular  upper body clothing?: Total ?Help from another person to put on and taking off regular lower body clothing?: Total ?6 Click Score: 8 ?  ?End of Session Nurse Communication: Mobility status (RN present during session) ? ?Activity Tolerance: Other (comment) (limited by cognition) ?Patient left: in bed;with call bell/phone within reach;with bed alarm set;with nursing/sitter in room ? ?OT Visit Diagnosis: Unsteadiness on feet (R26.81);Other abnormalities of gait and mobility (R26.89);Muscle weakness  (generalized) (M62.81);Other symptoms and signs involving cognitive function  ?              ?Time: 8453-6468 ?OT Time Calculation (min): 21 min ?Charges:  OT General Charges ?$OT Visit: 1 Visit ?OT Evaluation ?$

## 2021-12-28 NOTE — Evaluation (Signed)
Physical Therapy Evaluation ?Patient Details ?Name: Adam Swanson. ?MRN: [id number] ?DOB: 1928/06/15 ?Today's Date: 12/28/2021 ? ?History of Present Illness ? Adam Swanson. is a 88 y.o. male admitted 4/18 after an unwitnessed fall and questionable seizure activity. Admission complicated by agitation, confusion and progressive dysarthria. EEG negative for seizures. MRI pending. PMH: CKD, BPH, GERD, anxiety. ?  ?Clinical Impression ? Pt in bed upon arrival of PT, agreeable to evaluation at this time. Prior to admission the pt was mobilizing with use of rollator, living at [facility] independent living. The pt was otherwise unable to communicate any reliable information about prior level of function or assist needed. He followed < 25% of commands during today's evaluation, and was frequently restless, making nonsensical comments. He was able to voice a few relevant needs/concerns such as wanting chocolate ensure and stating that his back is sore. He required maxA of 1-2 to complete transition to and from sitting EOB, after a few min sitting EOB, he progressed from needing minA to maxA to maintain static sitting with the pt actively resisting sitting up, eventually requiring returning to supine. The pt will continue to benefit from skilled PT to address functional transfers, balance, and progression to gait and OOB mobility. Given level of assist needed at this time, will recommend d/c to SNF for continued rehab until ready to return to independent living.  ?   ?   ? ?Recommendations for follow up therapy are one component of a multi-disciplinary discharge planning process, led by the attending physician.  Recommendations may be updated based on patient status, additional functional criteria and insurance authorization. ? ?Follow Up Recommendations Skilled nursing-short term rehab (<3 hours/day) ? ?  ?Assistance Recommended at Discharge Frequent or constant Supervision/Assistance  ?Patient can return home with  the following ? Two people to help with walking and/or transfers;A lot of help with bathing/dressing/bathroom;Assistance with feeding;Direct supervision/assist for medications management;Direct supervision/assist for financial management;Assist for transportation;Help with stairs or ramp for entrance ? ?  ?Equipment Recommendations None recommended by PT  ?Recommendations for Other Services ?    ?  ?Functional Status Assessment Patient has had a recent decline in their functional status and demonstrates the ability to make significant improvements in function in a reasonable and predictable amount of time.  ? ?  ?Precautions / Restrictions Precautions ?Precautions: Fall;Other (comment) ?Precaution Comments: chest tube ?Restrictions ?Weight Bearing Restrictions: No  ? ?  ? ?Mobility ? Bed Mobility ?Overal bed mobility: Needs Assistance ?Bed Mobility: Supine to Sit, Sit to Supine ?  ?  ?Supine to sit: Total assist, +2 for physical assistance, +2 for safety/equipment, HOB elevated ?Sit to supine: Total assist, +2 for physical assistance, +2 for safety/equipment ?  ?General bed mobility comments: minimal initiation but did reach out hands and requested "pull me up". Total A x 2 to return to supine ?  ? ?Transfers ?  ?  ?  ?  ?  ?  ?  ?  ?  ?General transfer comment: unable d/t poor sitting balance and lack of command following ?  ? ?  ? ?Balance Overall balance assessment: Needs assistance ?Sitting-balance support: Bilateral upper extremity supported, Feet supported ?Sitting balance-Leahy Scale: Zero ?Sitting balance - Comments: initially Min A progressing to Max A to maintain balance with heavy posterior lean, actively pushing back ?Postural control: Posterior lean ?  ?  ?  ?  ?  ?  ?  ?  ?  ?  ?  ?  ?  ?  ?   ? ? ? ?  Pertinent Vitals/Pain Pain Assessment ?Pain Assessment: Faces ?Faces Pain Scale: Hurts even more ?Pain Location: back ?Pain Descriptors / Indicators: Grimacing, Guarding, Moaning ?Pain Intervention(s):  Monitored during session, Repositioned  ? ? ?Home Living Family/patient expects to be discharged to:: Other (Comment) ?  ?  ?  ?  ?  ?  ?  ?  ?  ?Additional Comments: From [place]  ?  ?Prior Function Prior Level of Function : Patient poor historian/Family not available ?  ?  ?  ?  ?  ?  ?Mobility Comments: per chart, ambulatory with walker. Pt appeared more familiar with Rollator than RW on eval but unable to provide info ?ADLs Comments: Unable to provide info for ADL PLOF. If from ILF, presumed that pt able to complete ADLs or needed light assist at most ?  ? ? ?Hand Dominance  ?   ? ?  ?Extremity/Trunk Assessment  ? Upper Extremity Assessment ?Upper Extremity Assessment: Defer to OT evaluation ?  ? ?Lower Extremity Assessment ?Lower Extremity Assessment: Difficult to assess due to impaired cognition (pt not following commands to test, spontaneous movement noted through session) ?  ? ?Cervical / Trunk Assessment ?Cervical / Trunk Assessment: Kyphotic  ?Communication  ? Communication: Expressive difficulties;Other (comment) (dysarthric)  ?Cognition Arousal/Alertness: Awake/alert ?Behavior During Therapy: Flat affect, Restless ?Overall Cognitive Status: No family/caregiver present to determine baseline cognitive functioning ?  ?  ?  ?  ?  ?  ?  ?  ?  ?  ?  ?  ?  ?  ?  ?  ?General Comments: per chart, hx of STM deficits. Pt with ativan given overnight, inconsistently following commands/answering questions. focused attention, A&Ox1. restless and making nonsensical comments at times. able to voice some needs/desires such as asking specifically for chocolate ensure. <25% command following ?  ?  ? ?  ?General Comments General comments (skin integrity, edema, etc.): SpO2 89-90% on RA. BP stable sitting EOB. RN present during session, trialing removal of wrist restraints ? ?  ?Exercises    ? ?Assessment/Plan  ?  ?PT Assessment Patient needs continued PT services  ?PT Problem List Decreased strength;Decreased range of  motion;Decreased activity tolerance;Decreased balance;Decreased mobility;Decreased coordination;Decreased cognition;Decreased safety awareness ? ?   ?  ?PT Treatment Interventions DME instruction;Gait training;Stair training;Functional mobility training;Therapeutic activities;Therapeutic exercise;Balance training;Patient/family education   ? ?PT Goals (Current goals can be found in the Care Plan section)  ?Acute Rehab PT Goals ?Patient Stated Goal: to get choclate ensure and lay back down ?PT Goal Formulation: With patient ?Time For Goal Achievement: 01/11/22 ?Potential to Achieve Goals: Fair ? ?  ?Frequency Min 3X/week ?  ? ? ?Co-evaluation PT/OT/SLP Co-Evaluation/Treatment: Yes ?Reason for Co-Treatment: Necessary to address cognition/behavior during functional activity;For patient/therapist safety;To address functional/ADL transfers ?PT goals addressed during session: Mobility/safety with mobility;Balance ?OT goals addressed during session: ADL's and self-care;Strengthening/ROM ?  ? ? ?  ?AM-PAC PT "6 Clicks" Mobility  ?Outcome Measure Help needed turning from your back to your side while in a flat bed without using bedrails?: A Lot ?Help needed moving from lying on your back to sitting on the side of a flat bed without using bedrails?: A Lot ?Help needed moving to and from a bed to a chair (including a wheelchair)?: Total ?Help needed standing up from a chair using your arms (e.g., wheelchair or bedside chair)?: Total ?Help needed to walk in hospital room?: Total ?Help needed climbing 3-5 steps with a railing? : Total ?6 Click Score: 8 ? ?  ?  End of Session   ?Activity Tolerance: Other (comment) (limited by confusion) ?Patient left: in bed;with call bell/phone within reach;with nursing/sitter in room;with bed alarm set ?Nurse Communication: Mobility status ?PT Visit Diagnosis: Other abnormalities of gait and mobility (R26.89);Unsteadiness on feet (R26.81);Muscle weakness (generalized) (M62.81);History of falling  (Z91.81) ?  ? ?Time: 7939-0300 ?PT Time Calculation (min) (ACUTE ONLY): 20 min ? ? ?Charges:   PT Evaluation ?$PT Eval Moderate Complexity: 1 Mod ?  ?  ?   ? ? ?West Carbo, PT, DPT  ? ?Acute Rehabilitation

## 2021-12-28 NOTE — Progress Notes (Signed)
Patient remains agitated. Order received to administer '1mg'$  Ativan IV.  ?

## 2021-12-28 NOTE — Assessment & Plan Note (Signed)
Resolved with supplementation and starting spironolactone. 

## 2021-12-28 NOTE — Progress Notes (Signed)
? ? ?   ?Subjective: ?CC: ?Orientated to self and time. Not place or situation. ?Notes report patient was agitated overnight, pulling at chest tube, requiring ativan and soft restraints.  ?Much more calm this morning. No complaints currently.  ?Asked if his son knew he was here. RN plans to call to give update.  ? ?Objective: ?Vital signs in last 24 hours: ?Temp:  [97.7 ?F (36.5 ?C)-98.5 ?F (36.9 ?C)] 97.9 ?F (36.6 ?C) (04/20 0730) ?Pulse Rate:  [62-80] 80 (04/20 0730) ?Resp:  [10-21] 18 (04/20 0730) ?BP: (104-148)/(58-87) 148/87 (04/20 0730) ?SpO2:  [90 %-98 %] 93 % (04/20 0730) ?Weight:  [57.8 kg] 57.8 kg (04/20 0500) ?Last BM Date :  (PTA) ? ?Intake/Output from previous day: ?04/19 0701 - 04/20 0700 ?In: -  ?Out: 1300 [Urine:650; Chest Tube:200] ?Intake/Output this shift: ?Total I/O ?In: 46 [P.O.:30] ?Out: -  ? ?PE: ?Gen:  Alert, NAD, pleasant ?Card:  Reg rate ?Pulm:  CTAB, no W/R/R, effort normal. L CT in place. Suture appears intact. 650 SS output since placement, 200/24 hours. No air leak. On -20 ?Abd: Soft, ND, NT, +BS ?Ext:  MAE's. ?Psych: A&Ox2 ? ?Lab Results:  ?Recent Labs  ?  12/26/21 ?2052 12/26/21 ?2057 12/27/21 ?0446  ?WBC 9.9  --  8.2  ?HGB 12.9* 13.9 11.2*  ?HCT 40.2 41.0 34.5*  ?PLT 218  --  172  ? ?BMET ?Recent Labs  ?  12/27/21 ?0446 12/28/21 ?9562  ?NA 137 132*  ?K 4.4 3.6  ?CL 107 103  ?CO2 24 23  ?GLUCOSE 137* 86  ?BUN 23 16  ?CREATININE 1.07 1.17  ?CALCIUM 8.6* 8.3*  ? ?PT/INR ?No results for input(s): LABPROT, INR in the last 72 hours. ?CMP  ?   ?Component Value Date/Time  ? NA 132 (L) 12/28/2021 0447  ? K 3.6 12/28/2021 0447  ? CL 103 12/28/2021 0447  ? CO2 23 12/28/2021 0447  ? GLUCOSE 86 12/28/2021 0447  ? BUN 16 12/28/2021 0447  ? CREATININE 1.17 12/28/2021 0447  ? CALCIUM 8.3 (L) 12/28/2021 0447  ? PROT 7.2 12/14/2021 1443  ? ALBUMIN 4.0 12/14/2021 1443  ? AST 20 12/14/2021 1443  ? ALT 14 12/14/2021 1443  ? ALKPHOS 60 12/14/2021 1443  ? BILITOT 0.7 12/14/2021 1443  ? GFRNONAA 58 (L)  12/28/2021 0447  ? GFRAA >60 11/06/2015 0933  ? ?Lipase  ?   ?Component Value Date/Time  ? LIPASE 36 12/14/2021 1443  ? ? ?Studies/Results: ?CT HEAD WO CONTRAST (5MM) ? ?Result Date: 12/26/2021 ?CLINICAL DATA:  Head trauma, moderate-severe; Polytrauma, blunt. Fall, seizure EXAM: CT HEAD WITHOUT CONTRAST CT CERVICAL SPINE WITHOUT CONTRAST TECHNIQUE: Multidetector CT imaging of the head and cervical spine was performed following the standard protocol without intravenous contrast. Multiplanar CT image reconstructions of the cervical spine were also generated. RADIATION DOSE REDUCTION: This exam was performed according to the departmental dose-optimization program which includes automated exposure control, adjustment of the mA and/or kV according to patient size and/or use of iterative reconstruction technique. COMPARISON:  09/23/2015 FINDINGS: CT HEAD FINDINGS Brain: Normal anatomic configuration. Parenchymal volume loss is commensurate with the patient's age. Moderate periventricular white matter changes are present likely reflecting the sequela of small vessel ischemia, stable since prior examination. Remote lacunar infarct again noted within the right insular cortex no abnormal intra or extra-axial mass lesion or fluid collection. No abnormal mass effect or midline shift. No evidence of acute intracranial hemorrhage or infarct. Stable borderline ventriculomegaly, proportional to the degree of parenchymal atrophy and  likely representing ex vacuo dilation. Cerebellum unremarkable. Vascular: No asymmetric hyperdense vasculature at the skull base. Skull: Intact Sinuses/Orbits: Paranasal sinuses are clear. Ocular lenses have been removed. Orbits are otherwise unremarkable. Other: Mastoid air cells and middle ear cavities are clear. CT CERVICAL SPINE FINDINGS Alignment: 2 mm anterolisthesis C4-5 is likely degenerative in nature. Otherwise normal cervical lordosis. Skull base and vertebrae: Craniocervical alignment is  normal. Atlantodental interval is not widened. No acute fracture of the cervical spine. Vertebral body height is preserved. Soft tissues and spinal canal: No prevertebral fluid or swelling. No visible canal hematoma. Disc levels: There is intervertebral disc space narrowing and endplate remodeling at H7-0 and C5-C7 in keeping with changes of advanced degenerative disc disease. Prevertebral soft tissues are not thickened. Spinal canal is widely patent. There is multilevel uncovertebral and facet arthrosis resulting in multilevel mild-to-moderate neuroforaminal narrowing, most severe bilaterally at C3-4. Upper chest: Left pneumothorax is partially visualized. Other: None IMPRESSION: No acute intracranial abnormality.  No calvarial fracture. Moderate senescent change. Remote right insular cortex lacunar infarct. These findings appears stable since prior examination of 09/23/2015. No acute fracture or listhesis of the cervical spine. Degenerative changes as outlined above. Left pneumothorax, incompletely visualized. Electronically Signed   By: Fidela Salisbury M.D.   On: 12/26/2021 21:55  ? ?CT Cervical Spine Wo Contrast ? ?Result Date: 12/26/2021 ?CLINICAL DATA:  Head trauma, moderate-severe; Polytrauma, blunt. Fall, seizure EXAM: CT HEAD WITHOUT CONTRAST CT CERVICAL SPINE WITHOUT CONTRAST TECHNIQUE: Multidetector CT imaging of the head and cervical spine was performed following the standard protocol without intravenous contrast. Multiplanar CT image reconstructions of the cervical spine were also generated. RADIATION DOSE REDUCTION: This exam was performed according to the departmental dose-optimization program which includes automated exposure control, adjustment of the mA and/or kV according to patient size and/or use of iterative reconstruction technique. COMPARISON:  09/23/2015 FINDINGS: CT HEAD FINDINGS Brain: Normal anatomic configuration. Parenchymal volume loss is commensurate with the patient's age. Moderate  periventricular white matter changes are present likely reflecting the sequela of small vessel ischemia, stable since prior examination. Remote lacunar infarct again noted within the right insular cortex no abnormal intra or extra-axial mass lesion or fluid collection. No abnormal mass effect or midline shift. No evidence of acute intracranial hemorrhage or infarct. Stable borderline ventriculomegaly, proportional to the degree of parenchymal atrophy and likely representing ex vacuo dilation. Cerebellum unremarkable. Vascular: No asymmetric hyperdense vasculature at the skull base. Skull: Intact Sinuses/Orbits: Paranasal sinuses are clear. Ocular lenses have been removed. Orbits are otherwise unremarkable. Other: Mastoid air cells and middle ear cavities are clear. CT CERVICAL SPINE FINDINGS Alignment: 2 mm anterolisthesis C4-5 is likely degenerative in nature. Otherwise normal cervical lordosis. Skull base and vertebrae: Craniocervical alignment is normal. Atlantodental interval is not widened. No acute fracture of the cervical spine. Vertebral body height is preserved. Soft tissues and spinal canal: No prevertebral fluid or swelling. No visible canal hematoma. Disc levels: There is intervertebral disc space narrowing and endplate remodeling at Y6-3 and C5-C7 in keeping with changes of advanced degenerative disc disease. Prevertebral soft tissues are not thickened. Spinal canal is widely patent. There is multilevel uncovertebral and facet arthrosis resulting in multilevel mild-to-moderate neuroforaminal narrowing, most severe bilaterally at C3-4. Upper chest: Left pneumothorax is partially visualized. Other: None IMPRESSION: No acute intracranial abnormality.  No calvarial fracture. Moderate senescent change. Remote right insular cortex lacunar infarct. These findings appears stable since prior examination of 09/23/2015. No acute fracture or listhesis of the cervical spine. Degenerative  changes as outlined above.  Left pneumothorax, incompletely visualized. Electronically Signed   By: Fidela Salisbury M.D.   On: 12/26/2021 21:55  ? ?DG Pelvis Portable ? ?Result Date: 12/26/2021 ?CLINICAL DATA:  Fall. EXAM: PORTABLE PELVIS 1-2

## 2021-12-28 NOTE — Progress Notes (Signed)
Patient has progressively become agitated and restless, insisting that he is at his home and not in the hospital. Patient making multiple attempts to get out of bed and refusing to follow commands. Patient also pulling at chest tube and removing condom cath. Patient trying to kick and punch staff. Bilateral soft wrist restraints placed for patient's safety. Night shit MD notified. Patient still aggressive and actively trying to get out of bed even after restraints applied.  ?

## 2021-12-28 NOTE — Assessment & Plan Note (Addendum)
On second night of admission, patient very agitated and trying to remove chest tube. ? ?Medications adjustment next day, and following night, slept through night and was in fact sedated sleepy all next day and night. ?Today now, he is alert, joking, interactiev and more approrpatie, just slightly disoriented.   ? ?- Continue reduced benzodiazepine dose (gets '2mg'$  PO at night at home) --> here only 1/2 mg ?- Avoid Haldol, increasing benzodiazepines ?- Continue Depakote for seizure prophylaxis, with secondary benefit of mood stabilization ?- if absolutely needed, could use low dose Seroquel ?- Routine delirium precautions ?

## 2021-12-28 NOTE — Progress Notes (Signed)
Neurology Progress Note ? ? ?S:// ?Seen and examined ?Extremely agitated overnight requiring restraints ?EEG completed overnight-normal ?MRI pending ? ?O:// ?Current vital signs: ?BP (!) 148/87   Pulse 80   Temp 97.9 ?F (36.6 ?C) (Oral)   Resp 18   Ht '5\' 7"'$  (1.702 m)   Wt 57.8 kg   SpO2 93%   BMI 19.96 kg/m?  ?Vital signs in last 24 hours: ?Temp:  [97.7 ?F (36.5 ?C)-98.5 ?F (36.9 ?C)] 97.9 ?F (36.6 ?C) (04/20 0730) ?Pulse Rate:  [62-80] 80 (04/20 0730) ?Resp:  [10-21] 18 (04/20 0730) ?BP: (104-148)/(58-87) 148/87 (04/20 0730) ?SpO2:  [90 %-98 %] 93 % (04/20 0730) ?Weight:  [57.8 kg] 57.8 kg (04/20 0500) ?General: Drowsy and combative ?HEENT: Normocephalic atraumatic ?CVS: Regular rate rhythm ?Respiratory: Breathing well saturating normally on room air ?Neurological exam ?Mental status: He is somewhat drowsy, opens eyes to voice and becomes extremely agitated and combative-does not make the restraints. ?On asking him his name, he is able to tell me his correct name, could not tell me his age correctly.  Was able to tell me that he is in the hospital, could not tell me the month.  Unable to provide current history today.  This is different than what my colleague had seen yesterday on his examination. ?Speech and language: Extremely dysarthric speech, no gross aphasia but only is able to follow simple commands inconsistently. ?Cranial nerves II to XII intact ?Motor examination: Decreased range of motion in the left arm but otherwise nonfocal symmetric antigravity strength in all fours ?Sensation is intact ?Cerebellar exam is difficult ? ?Medications ? ?Current Facility-Administered Medications:  ?  acetaminophen (TYLENOL) tablet 1,000 mg, 1,000 mg, Oral, Q6H, Kae Heller, Chelsea A, MD, 1,000 mg at 12/27/21 1724 ?  demeclocycline (DECLOMYCIN) tablet 150 mg, 150 mg, Oral, BID, Danford, Suann Larry, MD, 150 mg at 12/27/21 1953 ?  famotidine (PEPCID) tablet 20 mg, 20 mg, Oral, QPM, Danford, Suann Larry, MD, 20 mg at  12/27/21 1724 ?  fentaNYL (SUBLIMAZE) injection 12.5 mcg, 12.5 mcg, Intravenous, Q2H PRN, Romana Juniper A, MD, 12.5 mcg at 12/28/21 0206 ?  furosemide (LASIX) tablet 40 mg, 40 mg, Oral, Daily, Danford, Suann Larry, MD ?  heparin injection 5,000 Units, 5,000 Units, Subcutaneous, Q8H, Lenore Cordia, MD, 5,000 Units at 12/28/21 0541 ?  hydrOXYzine (ATARAX) tablet 25 mg, 25 mg, Oral, QHS PRN, Howerter, Justin B, DO ?  levETIRAcetam (KEPPRA) tablet 500 mg, 500 mg, Oral, BID, Danford, Suann Larry, MD, 500 mg at 12/27/21 1952 ?  lidocaine (LIDODERM) 5 % 1 patch, 1 patch, Transdermal, Q24H, Norm Parcel, PA-C, 1 patch at 12/27/21 1007 ?  LORazepam (ATIVAN) tablet 1 mg, 1 mg, Oral, QHS, Zada Finders R, MD, 1 mg at 12/27/21 1951 ?  methocarbamol (ROBAXIN) tablet 500 mg, 500 mg, Oral, Q6H PRN, Romana Juniper A, MD ?  metoprolol tartrate (LOPRESSOR) tablet 12.5 mg, 12.5 mg, Oral, BID, Zada Finders R, MD, 12.5 mg at 12/27/21 1951 ?  ondansetron (ZOFRAN) tablet 4 mg, 4 mg, Oral, Q6H PRN **OR** ondansetron (ZOFRAN) injection 4 mg, 4 mg, Intravenous, Q6H PRN, Zada Finders R, MD ?  oxyCODONE (Oxy IR/ROXICODONE) immediate release tablet 2.5-5 mg, 2.5-5 mg, Oral, Q4H PRN, Romana Juniper A, MD, 5 mg at 12/27/21 0013 ?  pantoprazole (PROTONIX) EC tablet 40 mg, 40 mg, Oral, Daily, Danford, Suann Larry, MD, 40 mg at 12/27/21 1544 ?  senna-docusate (Senokot-S) tablet 1 tablet, 1 tablet, Oral, QHS PRN, Lenore Cordia, MD ?  sodium  chloride flush (NS) 0.9 % injection 3 mL, 3 mL, Intravenous, Q12H, Zada Finders R, MD, 3 mL at 12/27/21 1953 ?  sodium chloride tablet 2 g, 2 g, Oral, BID WC, Danford, Suann Larry, MD, 2 g at 12/27/21 1544 ?  tamsulosin (FLOMAX) capsule 0.4 mg, 0.4 mg, Oral, QHS, Zada Finders R, MD, 0.4 mg at 12/27/21 1952 ?Labs ?CBC ?   ?Component Value Date/Time  ? WBC 8.2 12/27/2021 0446  ? RBC 3.76 (L) 12/27/2021 0446  ? HGB 11.2 (L) 12/27/2021 0446  ? HCT 34.5 (L) 12/27/2021 0446  ? PLT 172 12/27/2021  0446  ? MCV 91.8 12/27/2021 0446  ? MCV 96.2 08/03/2015 1518  ? MCH 29.8 12/27/2021 0446  ? MCHC 32.5 12/27/2021 0446  ? RDW 15.5 12/27/2021 0446  ? LYMPHSABS 3.9 12/26/2021 2052  ? MONOABS 1.1 (H) 12/26/2021 2052  ? EOSABS 0.3 12/26/2021 2052  ? BASOSABS 0.1 12/26/2021 2052  ? ? ?CMP  ?   ?Component Value Date/Time  ? NA 132 (L) 12/28/2021 0447  ? K 3.6 12/28/2021 0447  ? CL 103 12/28/2021 0447  ? CO2 23 12/28/2021 0447  ? GLUCOSE 86 12/28/2021 0447  ? BUN 16 12/28/2021 0447  ? CREATININE 1.17 12/28/2021 0447  ? CALCIUM 8.3 (L) 12/28/2021 0447  ? PROT 7.2 12/14/2021 1443  ? ALBUMIN 4.0 12/14/2021 1443  ? AST 20 12/14/2021 1443  ? ALT 14 12/14/2021 1443  ? ALKPHOS 60 12/14/2021 1443  ? BILITOT 0.7 12/14/2021 1443  ? GFRNONAA 58 (L) 12/28/2021 0447  ? GFRAA >60 11/06/2015 0933  ? ? ? ?Imaging ?I have reviewed images in epic and the results pertinent to this consultation are: ?CT head negative for acute process ?EEG-normal ?MRI brain without contrast pending ? ?Assessment:  ?88 year old male with a new onset seizure.  Has a history of short-term memory loss-I would suspect that he has some underlying moderate dementia and is having hospital associated delirium. ?I would recommend definitely getting the MRI since he is extremely dysarthric on exam today which is much different than his exam yesterday. ? ?Impression: ?New onset seizure ?Evaluate for stroke ?Outpatient work-up for dementia ? ?Recommendations: ?Continue Keppra 500 twice daily ?If he remains agitated, might consider switching Keppra to valproate as Keppra can sometimes have side effects of agitation ?EEG has been completed and is negative ?MRI brain without contrast would be helpful ?I will check B12, TSH ?Try to get him off of restraints and in chair ?Delirium precautions ?Discussed my plan with Dr. Loleta Books ?I will follow. ?-- ?Amie Portland, MD ?Neurologist ?[facility] ?Pager: [phone] ?

## 2021-12-28 NOTE — Assessment & Plan Note (Signed)
Hgb at baseline, no clinical bleeding ?

## 2021-12-28 NOTE — Progress Notes (Signed)
TR[facility] night cross cover note: ? ?I was notified by RN that the patient appears anxious and agitated in spite of receipt of scheduled Ativan 1 mg p.o. at [facility] on 12/27/2021.  Included in this, the patient has refused his previously ordered MRI brain.  Subsequently, there has been progression of his agitation that has been refractory to efforts at verbal redirection, with the patient reportedly now pulling at medical equipment, including at his chest tube.  As the patient's actions are interfering with provision of his medical care, and potentially harmful to himself on this basis, I have placed order for soft bilateral wrist restraints.  Per additional chart review, it appears that he takes up to 2 mg of scheduled Ativan on a nightly basis as an outpatient.  Consequently, I ordered an additional 1 mg of Ativan x 1 dose to be administered intravenously at this time.  ? ? ? ?Babs Bertin, DO ?Hospitalist  ?

## 2021-12-28 NOTE — Evaluation (Signed)
Clinical/Bedside Swallow Evaluation ?Patient Details  ?Name: Adam Swanson. ?MRN: [id number] ?Date of Birth: Aug 06, 1928 ? ?Today's Date: 12/28/2021 ?Time: SLP Start Time (ACUTE ONLY): 2353 SLP Stop Time (ACUTE ONLY): 1450 ?SLP Time Calculation (min) (ACUTE ONLY): 25 min ? ?Past Medical History:  ?Past Medical History:  ?Diagnosis Date  ? Anxiety   ? Colon polyp   ? hyperplastic  ? Diverticulosis of colon (without mention of hemorrhage)   ? Esophageal stricture   ? GERD (gastroesophageal reflux disease)   ? Hiatal hernia   ? Lower extremity weakness   ? OCD (obsessive compulsive disorder)   ? Viral hepatitis 08/1983  ? ?Past Surgical History:  ?Past Surgical History:  ?Procedure Laterality Date  ? COLONOSCOPY    ? HEMORRHOID SURGERY    ? 1965  ? ?HPI:  ?88yo male admitted 12/26/21 after a fall and questionable seizure activity. Admission complicated by agitation, confusion and progressive dysarthria. PMH: baseline confusion and STM issues. CKD, BPH, GERD, anxiety, OCD, anxiety, esophageal stricture, hiatal hernia. MRI pending. CXR = interval improvement in aeration of BLL suggesting resolving atelectasis/PNA  ?  ?Assessment / Plan / Recommendation  ?Clinical Impression ? Pt seen at bedside for assessment of swallow function and safety. RN approved PO intake for evaluation. RN reports pt appeared to tolerate solid textures earlier, but exhibited coughing with liquids. Pt was awake and cooperative, but confused. He presents with adequate natural dentition. CN exam is unremarkable. Pt accepted trials of puree, thin liquid, solids and nectar thick liquid. Puree and nectar thick liquids were tolerated without obvious oral difficulty or overt s/s aspiration. Pt exhibited cough response after thin liquids and cracker. He seemed to have difficulty with biting off a piece of the cracker, and had oral residue after the swallow. At this time, a puree/nectar thick liquid diet is recommended to minimize aspiration risk and  conserve energy. Meds crushed in puree. Will proceed with instrumental study tomorrow (Friday 12/29/21) to assess swallow physiology and identify least restrictive diet. Safe swallow precautions placed at [facility]. RN and MD informed of BSE results and recommendations. ? ?SLP Visit Diagnosis: Dysphagia, unspecified (R13.10) ?   ?Aspiration Risk ? Moderate aspiration risk;Risk for inadequate nutrition/hydration  ?  ?Diet Recommendation Nectar-thick liquid;Dysphagia 1 (Puree)  ? ?Liquid Administration via: Straw ?Medication Administration: Crushed with puree ?Supervision: Full supervision/cueing for compensatory strategies;Staff to assist with self feeding ?Compensations: Minimize environmental distractions;Slow rate;Small sips/bites ?Postural Changes: Seated upright at 90 degrees;Remain upright for at least 30 minutes after po intake  ?  ?Other  Recommendations Oral Care Recommendations: Oral care BID ?Other Recommendations: Have oral suction available   ? ?Recommendations for follow up therapy are one component of a multi-disciplinary discharge planning process, led by the attending physician.  Recommendations may be updated based on patient status, additional functional criteria and insurance authorization. ? ?Follow up Recommendations Follow physician's recommendations for discharge plan and follow up therapies  ? ? ?  ?Assistance Recommended at Discharge Frequent or constant Supervision/Assistance  ?Functional Status Assessment Patient has had a recent decline in their functional status and/or demonstrates limited ability to make significant improvements in function in a reasonable and predictable amount of time  ?Frequency and Duration min 1 x/week  ?2 weeks ?  ?   ? ?Prognosis Prognosis for Safe Diet Advancement: Fair ?Barriers to Reach Goals: Cognitive deficits  ? ?  ? ?Swallow Study   ?General Date of Onset: 12/28/21 ?HPI: 88yo male admitted 12/26/21 after a fall and questionable seizure activity.  Admission  complicated by agitation, confusion and progressive dysarthria. PMH: baseline confusion and STM issues. CKD, BPH, GERD, anxiety, OCD, anxiety, esophageal stricture, hiatal hernia. MRI pending. CXR = interval improvement in aeration of BLL suggesting resolving atelectasis/PNA ?Type of Study: Bedside Swallow Evaluation ?Previous Swallow Assessment: none ?Diet Prior to this Study: Regular;Thin liquids ?Temperature Spikes Noted: No ?Respiratory Status: Room air ?History of Recent Intubation: No ?Behavior/Cognition: Alert;Cooperative;Confused;Pleasant mood;Distractible ?Oral Cavity Assessment: Dry ?Oral Care Completed by SLP: No ?Oral Cavity - Dentition: Adequate natural dentition ?Vision: Functional for self-feeding ?Self-Feeding Abilities: Total assist ?Patient Positioning: Upright in bed ?Baseline Vocal Quality: Normal ?Volitional Cough: Cognitively unable to elicit ?Volitional Swallow: Unable to elicit  ?  ?Oral/Motor/Sensory Function Overall Oral Motor/Sensory Function: Within functional limits   ?Ice Chips Ice chips: Within functional limits ?Presentation: Spoon   ?Thin Liquid Thin Liquid: Impaired ?Presentation: Straw ?Pharyngeal  Phase Impairments: Cough - Immediate;Suspected delayed Swallow  ?  ?Nectar Thick Nectar Thick Liquid: Within functional limits ?Presentation: Straw   ?Honey Thick Honey Thick Liquid: Not tested   ?Puree Puree: Within functional limits ?Presentation: Spoon   ?Solid ? ? ?  Solid: Impaired ?Oral Phase Impairments: Impaired mastication;Poor awareness of bolus ?Oral Phase Functional Implications: Prolonged oral transit;Impaired mastication;Oral residue ?Pharyngeal Phase Impairments: Cough - Immediate  ? ?  ?Aldine Chakraborty B. Kaelah Hayashi, MSP, CCC-SLP ?Speech Language Pathologist ?Office: [phone] ? ?Shonna Chock ?12/28/2021,3:08 PM ? ? ? ?

## 2021-12-29 ENCOUNTER — Inpatient Hospital Stay (HOSPITAL_COMMUNITY): Payer: Medicare Other

## 2021-12-29 DIAGNOSIS — N189 Chronic kidney disease, unspecified: Secondary | ICD-10-CM | POA: Diagnosis not present

## 2021-12-29 DIAGNOSIS — F419 Anxiety disorder, unspecified: Secondary | ICD-10-CM | POA: Diagnosis not present

## 2021-12-29 DIAGNOSIS — S2242XA Multiple fractures of ribs, left side, initial encounter for closed fracture: Secondary | ICD-10-CM | POA: Diagnosis not present

## 2021-12-29 DIAGNOSIS — R569 Unspecified convulsions: Secondary | ICD-10-CM | POA: Diagnosis not present

## 2021-12-29 DIAGNOSIS — R410 Disorientation, unspecified: Secondary | ICD-10-CM | POA: Diagnosis not present

## 2021-12-29 DIAGNOSIS — I4891 Unspecified atrial fibrillation: Secondary | ICD-10-CM | POA: Diagnosis not present

## 2021-12-29 DIAGNOSIS — Z7189 Other specified counseling: Secondary | ICD-10-CM

## 2021-12-29 DIAGNOSIS — N1831 Chronic kidney disease, stage 3a: Secondary | ICD-10-CM | POA: Diagnosis not present

## 2021-12-29 MED ORDER — VALPROATE SODIUM 100 MG/ML IV SOLN
500.0000 mg | Freq: Two times a day (BID) | INTRAVENOUS | Status: DC
  Administered 2021-12-29 – 2021-12-31 (×4): 500 mg via INTRAVENOUS
  Filled 2021-12-29 (×5): qty 5

## 2021-12-29 MED ORDER — SODIUM CHLORIDE 0.9 % IV SOLN: INTRAVENOUS | Status: DC

## 2021-12-29 MED ORDER — ORAL CARE MOUTH RINSE
15.0000 mL | Freq: Two times a day (BID) | OROMUCOSAL | Status: DC
  Administered 2021-12-29 – 2022-01-02 (×7): 15 mL via OROMUCOSAL

## 2021-12-29 MED ORDER — VALPROATE SODIUM 100 MG/ML IV SOLN
1500.0000 mg | Freq: Once | INTRAVENOUS | Status: AC
  Administered 2021-12-29: 1500 mg via INTRAVENOUS
  Filled 2021-12-29: qty 15

## 2021-12-29 NOTE — Plan of Care (Incomplete)
?  Problem: Clinical Measurements: ?Goal: Ability to maintain clinical measurements within normal limits will improve ?12/29/2021 0231 by Frederico Hamman, RN ?Outcome: Progressing ?12/29/2021 0230 by Frederico Hamman, RN ?Outcome: Progressing ?  ?Problem: Clinical Measurements: ?Goal: Respiratory complications will improve ?12/29/2021 0231 by Frederico Hamman, RN ?Outcome: Progressing ?12/29/2021 0230 by Frederico Hamman, RN ?Outcome: Progressing ?  ?Problem: Clinical Measurements: ?Goal: Diagnostic test results will improve ?Outcome: Progressing ?  ?Problem: Clinical Measurements: ?Goal: Cardiovascular complication will be avoided ?12/29/2021 0231 by Frederico Hamman, RN ?Outcome: Progressing ?12/29/2021 0230 by Frederico Hamman, RN ?Outcome: Progressing ?  ?Problem: Pain Managment: ?Goal: General experience of comfort will improve ?12/29/2021 0231 by Frederico Hamman, RN ?Outcome: Progressing ?12/29/2021 0230 by Frederico Hamman, RN ?Outcome: Progressing ?  ?Problem: Safety: ?Goal: Ability to remain free from injury will improve ?12/29/2021 0231 by Frederico Hamman, RN ?Outcome: Progressing ?12/29/2021 0230 by Frederico Hamman, RN ?Outcome: Progressing ?  ?Problem: Skin Integrity: ?Goal: Risk for impaired skin integrity will decrease ?12/29/2021 0231 by Frederico Hamman, RN ?Outcome: Progressing ?12/29/2021 0230 by Frederico Hamman, RN ?Outcome: Progressing ?  ?Problem: Safety: ?Goal: Non-violent Restraint(s) ?12/29/2021 0231 by Frederico Hamman, RN ?Outcome: Not Progressing ?12/29/2021 0230 by Frederico Hamman, RN ?Outcome: Not Progressing ?  ?

## 2021-12-29 NOTE — Progress Notes (Signed)
Modified Barium Swallow Progress Note ? ?Patient Details  ?Name: Adam Swanson. ?MRN: [id number] ?Date of Birth: 07-Feb-1928 ? ?Today's Date: 12/29/2021 ? ?Modified Barium Swallow completed.  Full report located under Chart Review in the Imaging Section. ? ?Brief recommendations include the following: ? ?Clinical Impression ? Pt presents with a mod-severe dysphagia marked by generalized weakness that is impacting all aspects of the swallow.  Demonstrated immediate aspiration of thin and nectar thick liquids due to delayed laryngeal closure.  There was poor pharyngeal and tongue-base contraction, leading to residue throughout the pharynx that spilled into the airway after the swallow. Pt was drowsy during study; had low energy and difficulty following commands. Unfortunately, given today's performance and aspiration of all trials, the safest option is to remain NPO.  Critical meds could be crushed and given in puree - he is at risk for aspirating this as well.  If aligned with pt's wishes, one could consider a cortrak as a temporary measure until his mental status improves.  D/W MD and RN. ?  ?Swallow Evaluation Recommendations ? ?   ? ? SLP Diet Recommendations: Alternative means - temporary;NPO ? ?   ? ? Medication Administration: Crushed with puree ? ?   ? ?   ? ?   ? ? Oral Care Recommendations: Oral care QID ? ? Other Recommendations: Have oral suction available ? ? ?Herson Prichard L. Arturo Freundlich, MA CCC/SLP ?Acute Rehabilitation Services ?Office number [phone] ?Pager [phone] ?Juan Quam Laurice ?12/29/2021,9:40 AM ?

## 2021-12-29 NOTE — Progress Notes (Signed)
? ?Progress Note ? ?   ?Subjective: ?Patient confused some this AM, in wrist restraints and being assisted with breakfast.  ? ?Objective: ?Vital signs in last 24 hours: ?Temp:  [97.6 ?F (36.4 ?C)-98.6 ?F (37 ?C)] 98.6 ?F (37 ?C) (04/21 4097) ?Pulse Rate:  [87-94] 87 (04/21 3532) ?Resp:  [16-20] 16 (04/21 9924) ?BP: (110-132)/(80-93) 120/80 (04/21 2683) ?SpO2:  [92 %-95 %] 93 % (04/21 4196) ?Weight:  [54.4 kg] 54.4 kg (04/21 2229) ?Last BM Date :  (pta) ? ?Intake/Output from previous day: ?04/20 0701 - 04/21 0700 ?In: 360 [P.O.:360] ?Out: 2780 [Urine:2650; Chest Tube:130] ?Intake/Output this shift: ?Total I/O ?In: 118 [P.O.:118] ?Out: -  ? ?PE: ?Gen:  Alert, NAD, pleasant ?Card:  Reg rate ?Pulm:  CTAB, no W/R/R, effort normal. L CT in place. 810 SS output since placement, 160/24 hours. No air leak. On WS ?Abd: Soft, ND, NT, +BS ?Ext:  MAE's. ? ? ?Lab Results:  ?Recent Labs  ?  12/26/21 ?2052 12/26/21 ?2057 12/27/21 ?0446  ?WBC 9.9  --  8.2  ?HGB 12.9* 13.9 11.2*  ?HCT 40.2 41.0 34.5*  ?PLT 218  --  172  ? ?BMET ?Recent Labs  ?  12/27/21 ?0446 12/28/21 ?7989  ?NA 137 132*  ?K 4.4 3.6  ?CL 107 103  ?CO2 24 23  ?GLUCOSE 137* 86  ?BUN 23 16  ?CREATININE 1.07 1.17  ?CALCIUM 8.6* 8.3*  ? ?PT/INR ?No results for input(s): LABPROT, INR in the last 72 hours. ?CMP  ?   ?Component Value Date/Time  ? NA 132 (L) 12/28/2021 0447  ? K 3.6 12/28/2021 0447  ? CL 103 12/28/2021 0447  ? CO2 23 12/28/2021 0447  ? GLUCOSE 86 12/28/2021 0447  ? BUN 16 12/28/2021 0447  ? CREATININE 1.17 12/28/2021 0447  ? CALCIUM 8.3 (L) 12/28/2021 0447  ? PROT 7.2 12/14/2021 1443  ? ALBUMIN 4.0 12/14/2021 1443  ? AST 20 12/14/2021 1443  ? ALT 14 12/14/2021 1443  ? ALKPHOS 60 12/14/2021 1443  ? BILITOT 0.7 12/14/2021 1443  ? GFRNONAA 58 (L) 12/28/2021 0447  ? GFRAA >60 11/06/2015 0933  ? ?Lipase  ?   ?Component Value Date/Time  ? LIPASE 36 12/14/2021 1443  ? ? ? ? ? ?Studies/Results: ?DG CHEST PORT 1 VIEW ? ?Result Date: 12/29/2021 ?CLINICAL DATA:   88 year old male with history of pneumothorax. EXAM: PORTABLE CHEST - 1 VIEW COMPARISON:  12/28/2021, 12/27/2021 FINDINGS: The mediastinal contours are within normal limits. No cardiomegaly. Similar position of left mid apical pigtail thoracostomy tube. No evidence of residual pneumothorax. Persistent but smaller left lower lobe pulmonary opacity. No evidence of significant pleural effusion. No acute osseous abnormality. IMPRESSION: 1. No evidence of pneumothorax, similar position of left pigtail thoracostomy tube. 2. Continued improvement in left lower lobe pulmonary opacity. Electronically Signed   By: Ruthann Cancer M.D.   On: 12/29/2021 08:10  ? ?DG CHEST PORT 1 VIEW ? ?Result Date: 12/28/2021 ?CLINICAL DATA:  Left pneumothorax EXAM: PORTABLE CHEST 1 VIEW COMPARISON:  Previous studies including the examination of 12/27/2021 FINDINGS: Left chest tube is noted. There is no demonstrable pneumothorax. There is interval decrease in subcutaneous emphysema in the left chest wall. There is marked elevation of left hemidiaphragm. There is interval improvement in aeration of both lower lung fields. Degenerative changes are noted in both shoulders. IMPRESSION: There is no pneumothorax. Left chest tube is noted in place. There is interval improvement in aeration of lower lung fields suggesting resolving atelectasis/pneumonia. Electronically Signed   By:  Elmer Picker M.D.   On: 12/28/2021 08:54  ? ?EEG adult ? ?Result Date: 12/27/2021 ?Lora Havens, MD     12/27/2021 10:16 PM Patient Name: Adam Swanson. MRN: [id number] Epilepsy Attending: Lora Havens Referring Physician/Provider: Edwin Dada, MD Date: 12/27/2021 Duration: 22.36 mins Patient history: 88 year old male with new onset seizure. EEG to evaluate for seizure Level of alertness: Awake AEDs during EEG study: LEV Technical aspects: This EEG study was done with scalp electrodes positioned according to the 10-20 International system of  electrode placement. Electrical activity was acquired at a sampling rate of '500Hz'$  and reviewed with a high frequency filter of '70Hz'$  and a low frequency filter of '1Hz'$ . EEG data were recorded continuously and digitally stored. Description: The posterior dominant rhythm consists of 8 Hz activity of moderate voltage (25-35 uV) seen predominantly in posterior head regions, symmetric and reactive to eye opening and eye closing. Hyperventilation and photic stimulation were not performed.   IMPRESSION: This study is within normal limits. No seizures or epileptiform discharges were seen throughout the recording. Priyanka Barbra Sarks  ? ?ECHOCARDIOGRAM COMPLETE ? ?Result Date: 12/27/2021 ?   ECHOCARDIOGRAM REPORT   Patient Name:   Adam Swanson. Date of Exam: 12/27/2021 Medical Rec #:  [id number]           Height:       67.0 in Accession #:    [id number]          Weight:       134.5 lb Date of Birth:  02-09-1928            BSA:          1.708 m? Patient Age:    4 years            BP:           124/82 mmHg Patient Gender: M                   HR:           65 bpm. Exam Location:  Inpatient Procedure: 2D Echo, Cardiac Doppler and Color Doppler Indications:    Afib  History:        Patient has no prior history of Echocardiogram examinations.  Sonographer:    Jyl Heinz Referring Phys: [id number] [place]  1. Septal and inferior apical hypokinesis . Left ventricular ejection fraction, by estimation, is 50 to 55%. The left ventricle has low normal function. The left ventricle has no regional wall motion abnormalities. The left ventricular internal cavity size was mildly dilated. Left ventricular diastolic parameters were normal.  2. Right ventricular systolic function is normal. The right ventricular size is normal.  3. The mitral valve is abnormal. Trivial mitral valve regurgitation. No evidence of mitral stenosis.  4. The aortic valve is tricuspid. There is moderate calcification of the aortic valve. There is  moderate thickening of the aortic valve. Aortic valve regurgitation is not visualized. Aortic valve sclerosis/calcification is present, without any evidence of aortic stenosis.  5. The inferior vena cava is normal in size with greater than 50% respiratory variability, suggesting right atrial pressure of 3 mmHg. FINDINGS  Left Ventricle: Septal and inferior apical hypokinesis. Left ventricular ejection fraction, by estimation, is 50 to 55%. The left ventricle has low normal function. The left ventricle has no regional wall motion abnormalities. The left ventricular internal cavity size was mildly dilated. There is no left ventricular hypertrophy. Left ventricular diastolic  parameters were normal. Right Ventricle: The right ventricular size is normal. No increase in right ventricular wall thickness. Right ventricular systolic function is normal. Left Atrium: Left atrial size was normal in size. Right Atrium: Right atrial size was normal in size. Pericardium: There is no evidence of pericardial effusion. Mitral Valve: The mitral valve is abnormal. There is mild thickening of the mitral valve leaflet(s). Trivial mitral valve regurgitation. No evidence of mitral valve stenosis. Tricuspid Valve: The tricuspid valve is normal in structure. Tricuspid valve regurgitation is mild . No evidence of tricuspid stenosis. Aortic Valve: The aortic valve is tricuspid. There is moderate calcification of the aortic valve. There is moderate thickening of the aortic valve. Aortic valve regurgitation is not visualized. Aortic valve sclerosis/calcification is present, without any  evidence of aortic stenosis. Aortic valve peak gradient measures 10.6 mmHg. Pulmonic Valve: The pulmonic valve was normal in structure. Pulmonic valve regurgitation is not visualized. No evidence of pulmonic stenosis. Aorta: The aortic root is normal in size and structure. Venous: The inferior vena cava is normal in size with greater than 50% respiratory  variability, suggesting right atrial pressure of 3 mmHg. IAS/Shunts: No atrial level shunt detected by color flow Doppler.  LEFT VENTRICLE PLAX 2D LVIDd:         3.40 cm     Diastology LVIDs:         2.20 cm     LV e' medial:

## 2021-12-29 NOTE — Consult Note (Signed)
? ?                                                                                ?Consultation Note ?Date: 12/29/2021  ? ?Patient Name: Adam Swanson.  ?DOB: 1927/12/06  MRN: [id number]  Age / Sex: 88 y.o., male  ?PCP: Hulan Fess, MD ?Referring Physician: Edwin Dada, * ? ?Reason for Consultation: Establishing goals of care ? ?HPI/Patient Profile: 88 y.o. male with past medical history of CKD stage IIIa, GERD, BPH, anxiety admitted on 12/26/2021 with seizure after a fall.  ? ?Patient presents from [place] with left 6th-9th rib fractures with large left pneumothorax s/p chest tube placement. Also with new onset A-fib with RVR. Patient hospitalization has also been complicated with agitated delirium. PMT has been consulted to assist with goals of care conversation.  ? ?Clinical Assessment and Goals of Care: ? ?I have reviewed medical records including EPIC notes, labs and imaging, assessed the patient and then met at the bedside along with patient's son [name] to discuss diagnosis prognosis, St[place], EOL wishes, disposition and options. ? ?I introduced Palliative Medicine as specialized medical care for people living with serious illness. It focuses on providing relief from the symptoms and stress of a serious illness. The goal is to improve quality of life for both the patient and the family. ? ?We discussed a brief life review of the patient and then focused on their current illness. The natural disease trajectory and expectations at EOL were discussed. ? ?I attempted to elicit values and goals of care important to the patient.   ? ?Medical History Review and Understanding: ?Reviewed patient's fall, seizure, rib fractures, pneumothorax, and dysphagia. Patient's son Rosina Lowenstein his understanding. Discussed his history of depression and anxiety and relationship to risk for hospital-related delirium. ? ?Social History: ?Patient is widowed, his wife of 10 years died 21 years ago. He has two sons. 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 Many of his friends have passed away at advanced ages. He has lived at [facility] for 10 years. ? ?Functional and Nutritional State: ?Patient has been gradually declining at a steady pace since 2019, worsened by COVID. He has required use of a walker since this time and has fallen in the past. He was doing quite well in terms of cognition and nutrition prior to admission. ? ?Palliative Symptoms: ?Agitation, lethargy   ? ?Advance Directives: ?A detailed discussion regarding advanced directives was had. Patient's son reports he is HCPOA. ? ? ?Code Status: ?Concepts specific to code status, artifical feeding and hydration, and rehospitalization were considered and discussed. ? ?Discussion: ?Patient's son [name] shares that the patient has not been the same since his wife's death 11 years ago. He misses her daily. He has said on several occasions that he wants to "go on" and is ready to die. We discussed family's experience with the loss of patient's wife. Patient has never expressed his own preferences for end of life or his goals of care if he were to decline. He is described as a Research officer, trade union, high energy and very talkative. He is also known to be depressed.  Patient would never want to be dependent on machines. His son is understandably emotional after updating on MBS results. He wants to "do the right thing" and ultimately does not want patient to suffer. He speaks to his gratitude about the many years he has had with the patient and tradition of weekly lunches together. [name] shares that patient may have said he would agree with CPR in the past, but he is unsure. We discussed how things can change and that it is important to consider whether patient would desire aggressive measures in his current condition. Patient's son Wille Glaser arrives from [place] tomorrow and plans to leave Sunday - [name] will inform PMT of a time and date for ongoing [place]  discussion. ? ? ? ?The difference between aggressive medical intervention and comfort care was considered in light of the patient's goals of care. Hospice and Palliative Care services outpatient were explained and offered.  ? ?Discussed the importance of continued conversation with family and the medical providers regarding overall plan of care and treatment options, ensuring decisions are within the context of the patient?s values and GOCs.  ? ?Questions and concerns were addressed.  Hard Choices booklet left for review. The family was encouraged to call with questions or concerns.  PMT will continue to support holistically.  ? ?  ?SUMMARY OF RECOMMENDATIONS   ?-Continue current care, full code status ?-Patient's son/HCPOA [name] will coordinate with his brother for family meeting to discuss goals of care further this weekend ?-Psychosocial and emotional support provided ?-Spiritual care consult declined ?-PMT will continue to follow and support ? ? ?Prognosis:  ?Unable to determine ? ?Discharge Planning: To Be Determined  ? ?  ? ?Primary Diagnoses: ?Present on Admission: ? Atrial fibrillation with rapid ventricular response ([place]) ? Traumatic fracture of ribs with pneumothorax, left, closed, initial encounter ? Anxiety ? Chronic kidney disease, stage 3a ([place]) ? Hyponatremia ? ? ?I have reviewed the medical record, interviewed the patient and family, and examined the patient. The following aspects are pertinent. ? ?Past Medical History:  ?Diagnosis Date  ? Anxiety   ? Colon polyp   ? hyperplastic  ? Diverticulosis of colon (without mention of hemorrhage)   ? Esophageal stricture   ? GERD (gastroesophageal reflux disease)   ? Hiatal hernia   ? Lower extremity weakness   ? OCD (obsessive compulsive disorder)   ? Viral hepatitis 08/1983  ? ?Social History  ? ?Socioeconomic History  ? Marital status: Widowed  ?  Spouse name: Not on file  ? Number of children: 2  ? Years of education: HS  ? Highest education level: Not on  file  ?Occupational History  ? Occupation: Retired  ?Tobacco Use  ? Smoking status: Never  ? Smokeless tobacco: Never  ?Vaping Use  ? Vaping Use: Never used  ?Substance and Sexual Activity  ? Alcohol use: Yes  ?  Alcohol/week: 7.0 standard drinks  ?  Types: 7 Standard drinks or equivalent per week  ?  Comment: occasional  ? Drug use: No  ? Sexual activity: Not on file  ?Other Topics Concern  ? Not on file  ?Social History Narrative  ? Lives at home alone.  ? Right-handed.  ? No caffeine use.  ? ?Social Determinants of Health  ? ?Financial Resource Strain: Not on file  ?Food Insecurity: Not on file  ?Transportation Needs: Not on file  ?Physical Activity: Not on file  ?Stress: Not on file  ?Social Connections: Not on file  ? ?Family History  ?  Problem Relation Age of Onset  ? Diverticulosis Mother   ? Melanoma Mother   ? Lung cancer Father   ? Colon cancer Other   ?     1st cousion.  ? Allergic rhinitis Neg Hx   ? Angioedema Neg Hx   ? Asthma Neg Hx   ? Eczema Neg Hx   ? Immunodeficiency Neg Hx   ? Urticaria Neg Hx   ? ?Scheduled Meds: ? acetaminophen  1,000 mg Oral Q6H  ? demeclocycline  150 mg Oral BID  ? heparin  5,000 Units Subcutaneous Q8H  ? lidocaine  1 patch Transdermal Q24H  ? LORazepam  0.5 mg Oral QHS  ? mouth rinse  15 mL Mouth Rinse BID  ? metoprolol tartrate  12.5 mg Oral BID  ? sodium chloride flush  3 mL Intravenous Q12H  ? sodium chloride  2 g Oral BID WC  ? ?Continuous Infusions: ? sodium chloride 125 mL/hr at 12/29/21 1051  ? valproate sodium    ? ?PRN Meds:.ondansetron **OR** ondansetron (ZOFRAN) IV, oxyCODONE, senna-docusate ?Medications Prior to Admission:  ?Prior to Admission medications   ?Medication Sig Start Date End Date Taking? Authorizing Provider  ?demeclocycline (DECLOMYCIN) 150 MG tablet Take 1 tablet (150 mg total) by mouth 2 (two) times daily. ?Patient taking differently: Take 150 mg by mouth 2 (two) times daily. Continuously 08/21/19  Yes Renato Shin, MD  ?EPINEPHrine 0.3 mg/0.3 mL  IJ SOAJ injection Inject 0.3 mg into the muscle as needed for anaphylaxis. 11/03/21  Yes [provider]  ?esomeprazole (NEXIUM) 20 MG capsule Take 20 mg by mouth daily.   Yes [provider]  ?famotidine

## 2021-12-29 NOTE — Care Management Important Message (Signed)
Important Message ? ?Patient Details  ?Name: Adam Swanson. ?MRN: [id number] ?Date of Birth: September 22, 1927 ? ? ?Medicare Important Message Given:  Yes ? ? ? ? ?[name] ?12/29/2021, 9:34 AM ?

## 2021-12-29 NOTE — NC FL2 (Addendum)
?[place] MEDICAID FL2 LEVEL OF CARE SCREENING TOOL  ?  ? ?IDENTIFICATION  ?Patient Name: ?Adam Swanson. Birthdate: November 02, 1927 Sex: male Admission Date (Current Location): ?12/26/2021  ?[place] and [place] Number: ? [place] ?  Facility and Address:  ?[facility], [place] [address], [place], [place] [address] ?     Provider Number: ?[id number]  ?Attending Physician Name and Address:  ?Edwin Dada, * ? Relative Name and Phone Number:  ?Mausolf,John (Son)   [phone] ?   ?Current Level of Care: ?Hospital Recommended Level of Care: ?[place] Prior Approval Number: ?  ? ?Date Approved/Denied: ?  PASRR Number: ?[id number] A ? ?Discharge Plan: ?SNF ?  ? ?Current Diagnoses: ?Patient Active Problem List  ? Diagnosis Date Noted  ? Delirium 12/28/2021  ? Anemia in chronic kidney disease (CKD) 12/27/2021  ? Dementia without behavioral disturbance ([place]) 12/27/2021  ? Hypokalemia 12/27/2021  ? Atrial fibrillation with rapid ventricular response ([place]) 12/26/2021  ? Traumatic fracture of ribs with pneumothorax, left, closed, initial encounter 12/26/2021  ? Seizure-like activity ([place]) 12/26/2021  ? Anxiety   ? Chronic kidney disease, stage 3a ([place])   ? Gait abnormality 01/30/2017  ? Weakness 01/28/2017  ? Low back pain without sciatica 01/28/2017  ? Bronchopneumonia 09/24/2015  ? Hyponatremia 09/24/2015  ? Abnormal ECG 09/24/2015  ? HCAP (healthcare-associated pneumonia) 09/24/2015  ? Heart murmur 09/24/2015  ? 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 Benign neoplasm of colon 04/09/2011  ? Unspecified constipation 04/09/2011  ? Constipation 01/18/2011  ? Change in bowel function 01/18/2011  ? 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 General symptom  01/18/2011  ? Oropharyngeal dysphagia 01/18/2011  ? DIVERTICULOSIS, COLON 05/14/2007  ? HIATAL HERNIA 03/19/2003  ? ? ?Orientation RESPIRATION BLADDER Height & Weight   ?  ?Self, Time ?    Incontinent, External catheter Weight: 119 lb 14.9 oz (54.4 kg) ?Height:  '5\' 7"'$  (170.2 cm)  ?BEHAVIORAL SYMPTOMS/MOOD NEUROLOGICAL BOWEL NUTRITION STATUS  ?    Continent Diet (See DC Summary)  ?AMBULATORY STATUS COMMUNICATION OF NEEDS Skin   ?Extensive Assist Verbally Normal ?  ?  ?  ?    ?     ?     ? ? ?Personal Care Assistance Level of Assistance  ?Bathing, Feeding, Dressing Bathing Assistance: Maximum assistance ?Feeding assistance: Independent ?Dressing Assistance: Maximum assistance ?   ? ?Functional Limitations Info  ?Sight, Hearing, Speech Sight Info: Impaired ?Hearing Info: Adequate ?Speech Info: Adequate  ? ? ?SPECIAL CARE FACTORS FREQUENCY  ?PT (By licensed PT), OT (By licensed OT)   ?  ?PT Frequency: 5x a week ?OT Frequency: 5x a week ?  ?  ?  ?   ? ? ?Contractures Contractures Info: Not present  ? ? ?Additional Factors Info  ?Code Status, Allergies Code Status Info: Full ?Allergies Info: Bee Venom, Nsaids ?  ?  ?  ?   ? ?Current Medications (12/29/2021):  This is the current hospital active medication list ?Current Facility-Administered Medications  ?Medication Dose Route Frequency Provider Last Rate Last Admin  ? 0.9 %  sodium chloride infusion   Intravenous Continuous Edwin Dada, MD 125 mL/hr at 12/29/21 1051 New Bag at 12/29/21 1051  ? acetaminophen (TYLENOL) tablet 1,000 mg  1,000 mg Oral Q6H Romana Juniper A, MD   1,000 mg at 12/29/21 1052  ? demeclocycline (DECLOMYCIN) tablet 150 mg  150 mg Oral BID Danford, Suann Larry, MD   150 mg at  12/29/21 1052  ? heparin injection 5,000 Units  5,000 Units Subcutaneous Q8H Lenore Cordia, MD   5,000 Units at 12/29/21 1941  ? lidocaine (LIDODERM) 5 % 1 patch  1 patch Transdermal Q24H Norm Parcel, PA-C   1 patch at 12/29/21 7408  ? LORazepam (ATIVAN) tablet 0.5 mg  0.5 mg Oral QHS Edwin Dada, MD   0.5 mg at 12/28/21 2154  ? MEDLINE mouth rinse  15 mL Mouth Rinse BID Edwin Dada, MD   15 mL at 12/29/21 1054  ?  metoprolol tartrate (LOPRESSOR) tablet 12.5 mg  12.5 mg Oral BID Zada Finders R, MD   12.5 mg at 12/28/21 2155  ? ondansetron (ZOFRAN) tablet 4 mg  4 mg Oral Q6H PRN Lenore Cordia, MD      ? Or  ? ondansetron (ZOFRAN) injection 4 mg  4 mg Intravenous Q6H PRN Lenore Cordia, MD      ? oxyCODONE (Oxy IR/ROXICODONE) immediate release tablet 2.5-5 mg  2.5-5 mg Oral Q4H PRN Clovis Riley, MD   5 mg at 12/27/21 0013  ? senna-docusate (Senokot-S) tablet 1 tablet  1 tablet Oral QHS PRN Zada Finders R, MD      ? sodium chloride flush (NS) 0.9 % injection 3 mL  3 mL Intravenous Q12H Lenore Cordia, MD   3 mL at 12/28/21 2155  ? sodium chloride tablet 2 g  2 g Oral BID WC Danford, Suann Larry, MD   2 g at 12/29/21 1056  ? valproate (DEPACON) 500 mg in dextrose 5 % 50 mL IVPB  500 mg Intravenous Q12H August Albino, NP      ? ? ? ?Discharge Medications: ?Please see discharge summary for a list of discharge medications. ? ?Relevant Imaging Results: ? ?Relevant Lab Results: ? ? ?Additional Information ?SSN: [id number] ? ?Nicloe Frontera Renold Don, LCSWA ? ? ? ? ?

## 2021-12-29 NOTE — Progress Notes (Signed)
?Progress Note ? ? ?Patient: Adam Swanson. HYW:[id number] DOB: [birth year] DOA: 12/26/2021     3 ?DOS: the patient was seen and examined on 12/29/2021 at 11:39A ?  ? ? ? ?Brief hospital course: ?Adam Swanson is a 88 y.o. M with CKD stage IIIa, chronic hyponatremia, GERD, BPH, anxiety who presented after a fall and seizure-like activity at his [facility]. ? ?Int he ER, found to have left 6th-9th rib fractures with large left pneumothorax s/p chest tube placement.  Also with new onset A-fib with RVR.    ? ? ?4/18: Admitted, chest tube placed, HR controlled with diltiazem, Neurology consulted for seizure, Trauma consulted for rib fx and PTX ?4/19: Delirious at night ?4/20: Remains delirious ?4/21: Central acting agents adjusted, more sleepy today, not agitated ? ? ? ? ?Assessment and Plan: ?* Traumatic fracture of ribs with pneumothorax, left, closed, initial encounter ?- Chest tube and pulmonary toilet per Trauma ?- Plan for chest film after CT removal and also repeat in 2-3 weeks  ? ? ? ?Delirium ?On second night of admission, apteitn very agitated and trying to remove chest tube. ? ?Medications adjustment yesterday, and Last night, more sleepy with change to depakote and addition of Haldol. ?Today again quite sedated with new medication regimen. ?- Reduce benzodiazepine dose (gets '2mg'$  PO at night at home) ?- Stop Haldol ?-Continue Depakote for seizure prophylaxis, with secondary benefit of mood stabilization ?- if absolutely needed, will use low dose Seroquel ?- Routine delirium precautions ? ?- SLP consult ?- IV fluids until able to take PO safely ? ? ? ?Seizure-like activity (Adam Swanson) ?- Consult Neurology ?-Continue new Depakote  ?- Obtain MRI brain ? ?Atrial fibrillation with rapid ventricular response (Adam Swanson) ?New onset  ?Treated with IV Diltiazem in the ER and spontaneously converted to NSR ?Remains in NSR ?Echo with EF 50%, normal valves. TSH normal.  ?CHA2DS2-Vasc only 2, due to age. ?TSH normal, troponin low and flat,  doubt ACS, no further ischemic work up needed ?- Continue metoprolol ?- Hold anticoagulation for now ?  ? ?Hyponatremia ?Na mildly low, at baseline ?-Continue home salt tabs and demeclocycline as able to take PO ?- Trend Na ? ? ? ? ? ? ? ? ? ?Subjective: Patient is very sleepy today, no fever overnight, no respiratory distress.  No nursing concerns. ? ? ? ? ?Physical Exam: ?Vitals:  ? 12/28/21 2300 12/29/21 0000 12/29/21 0621 12/29/21 1053  ?BP:   120/80 (!) 87/61  ?Pulse: 94  87 85  ?Resp:   16   ?Temp:   98.6 ?F (37 ?C)   ?TempSrc:   Oral   ?SpO2: 95% 93% 93%   ?Weight:   54.4 kg   ?Height:      ? ?Elderly adult male, lying in bed, in restraints, very sleepy.  Opens eyes to voice, mumbles something incoherent, then falls back asleep. ?RRR, no murmurs, no peripheral edema ?Lung sounds diminished, respiratory effort shallow due to sleepiness, no rales or wheezes appreciated ?Negative grimace to palpation, no masses or rigidity appreciated ?Generalized weakness, symmetric strength, overall encephalopathy ? ?Data Reviewed: ?This with neurology, general surgery.  Nursing notes reviewed, vital signs reviewed. ?Echocardiogram report reviewed, EF 50%, no valve disease ?Chest x-ray personally reviewed, shows no worsening of the pneumothorax. ? ?Family Communication:  ? ? ? ?Disposition: ?Status is: Inpatient ?The patient presented with multiple rib fractures, pneumothorax requiring chest tube. ? ?Hospital stay has been complicated by severe delirium. ? ?We will need to obtain an MRI to  rule out stroke, and remove the chest tube, hopefully after the chest tube is removed, his delirium will clear, we will be able to transition to rehab within a day or 2 after that ? ? ? ? ? ? ? ?Author: ?Adam Dada, MD ?12/29/2021 12:25 PM ? ?For on call review [email or web address].  ? ? ?

## 2021-12-29 NOTE — TOC Initial Note (Signed)
Transition of Care (TOC) - Initial/Assessment Note  ? ? ?Patient Details  ?Name: Adam Swanson. ?MRN: [id number] ?Date of Birth: 07-Mar-1928 ? ?Transition of Care (TOC) CM/SW Contact:    ?Reece Agar, LCSWA ?Phone Number: ?12/29/2021, 2:55 PM ? ?Clinical Narrative:                 ?Pt is from [facility], [place] contacted [place] at [facility] to confirm pt bed when medically stable. No asnwer CSW left a VM CSW then contacted pt son since is only oriented to person and time., no answer CSW left a VM. Pt is full medicare and when medically stable pt will be able to DC without auth.  ? ?  ?  ? ? ?Patient Goals and CMS Choice ?  ?  ?  ? ?Expected Discharge Plan and Services ?  ?  ?  ?  ?  ?                ?  ?  ?  ?  ?  ?  ?  ?  ?  ?  ? ?Prior Living Arrangements/Services ?  ?  ?  ?       ?  ?  ?  ?  ? ?Activities of Daily Living ?  ?  ? ?Permission Sought/Granted ?  ?  ?   ?   ?   ?   ? ?Emotional Assessment ?  ?  ?  ?  ?  ?  ? ?Admission diagnosis:  Seizure ([place]) [R56.9] ?Atrial fibrillation with rapid ventricular response ([place]) [I48.91] ?Atrial fibrillation with RVR ([place]) [I48.91] ?Traumatic pneumothorax, initial encounter [S27.0XXA] ?Closed fracture of multiple ribs of left side, initial encounter [S22.42XA] ?Patient Active Problem List  ? Diagnosis Date Noted  ? Delirium 12/28/2021  ? Anemia in chronic kidney disease (CKD) 12/27/2021  ? Dementia without behavioral disturbance ([place]) 12/27/2021  ? Hypokalemia 12/27/2021  ? Atrial fibrillation with rapid ventricular response ([place]) 12/26/2021  ? Traumatic fracture of ribs with pneumothorax, left, closed, initial encounter 12/26/2021  ? Seizure-like activity ([place]) 12/26/2021  ? Anxiety   ? Chronic kidney disease, stage 3a ([place])   ? Gait abnormality 01/30/2017  ? Weakness 01/28/2017  ? Low back pain without sciatica 01/28/2017  ? Bronchopneumonia 09/24/2015  ? Hyponatremia 09/24/2015  ? Abnormal ECG 09/24/2015  ? HCAP (healthcare-associated pneumonia) 09/24/2015  ?  Heart murmur 09/24/2015  ? 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 Benign neoplasm of colon 04/09/2011  ? Unspecified constipation 04/09/2011  ? Constipation 01/18/2011  ? Change in bowel function 01/18/2011  ? 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 General symptom  01/18/2011  ? Oropharyngeal dysphagia 01/18/2011  ? DIVERTICULOSIS, COLON 05/14/2007  ? HIATAL HERNIA 03/19/2003  ? ?PCP:  Hulan Fess, MD ?Pharmacy:   ?[facility] [place], [address] ?[phone] ?[place] [address] ?Phone: [phone] Fax: [phone] ? ? ? ? ?Social Determinants of Health (SDOH) Interventions ?  ? ?Readmission Risk Interventions ?   ? View : No data to display.  ?  ?  ?  ? ? ? ?

## 2021-12-29 NOTE — Progress Notes (Signed)
Neurology Progress Note ? ? ?S:// ?Patient laying in bed, asleep in bilateral restraints. He awakens easily to voice, gets very agitated with minimal stimulation. He is oriented to self and moves all extremities spontaneously. No family at bedside. He is going down for a swallow eval now. ? ?O:// ?Current vital signs: ?BP 120/80 (BP Location: Right Arm)   Pulse 87   Temp 98.6 ?F (37 ?C) (Oral)   Resp 16   Ht '5\' 7"'$  (1.702 m)   Wt 54.4 kg   SpO2 93%   BMI 18.78 kg/m?  ?Vital signs in last 24 hours: ?Temp:  [97.6 ?F (36.4 ?C)-98.6 ?F (37 ?C)] 98.6 ?F (37 ?C) (04/21 2836) ?Pulse Rate:  [87-94] 87 (04/21 6294) ?Resp:  [16-20] 16 (04/21 7654) ?BP: (110-132)/(80-93) 120/80 (04/21 6503) ?SpO2:  [92 %-95 %] 93 % (04/21 5465) ?Weight:  [54.4 kg] 54.4 kg (04/21 6812) ? ?General: Drowsy and easily agitated  ?HEENT: Normocephalic atraumatic ?CVS: Regular rate rhythm ?Respiratory: Breathing well saturating normally on room air ? ?Neurological exam ?Mental status: He is drowsy, opens eyes to voice and becomes easily agitated. Able to follow simple commands ?He is able to tell me his correct name. States age as "88". ?Speech and language: dysarthric ?Cranial nerves II to XII intact ?Motor examination: Decreased range of motion in the left arm but otherwise nonfocal symmetric antigravity strength in all fours ?Sensation is intact ?Cerebellar exam unable to assess ? ?Medications ? ?Current Facility-Administered Medications:  ?  acetaminophen (TYLENOL) tablet 1,000 mg, 1,000 mg, Oral, Q6H, Kae Heller, Chelsea A, MD, 1,000 mg at 12/29/21 0001 ?  demeclocycline (DECLOMYCIN) tablet 150 mg, 150 mg, Oral, BID, Danford, Suann Larry, MD, 150 mg at 12/28/21 2153 ?  famotidine (PEPCID) tablet 20 mg, 20 mg, Oral, QPM, Danford, Suann Larry, MD, 20 mg at 12/27/21 1724 ?  haloperidol (HALDOL) tablet 2 mg, 2 mg, Oral, QHS, Danford, Suann Larry, MD, 2 mg at 12/28/21 2154 ?  heparin injection 5,000 Units, 5,000 Units, Subcutaneous, Q8H,  Zada Finders R, MD, 5,000 Units at 12/29/21 7517 ?  hydrOXYzine (ATARAX) tablet 25 mg, 25 mg, Oral, QHS PRN, Howerter, Justin B, DO ?  lidocaine (LIDODERM) 5 % 1 patch, 1 patch, Transdermal, Q24H, Norm Parcel, PA-C, 1 patch at 12/29/21 0017 ?  LORazepam (ATIVAN) tablet 0.5 mg, 0.5 mg, Oral, QHS, Danford, Suann Larry, MD, 0.5 mg at 12/28/21 2154 ?  MEDLINE mouth rinse, 15 mL, Mouth Rinse, BID, Danford, Suann Larry, MD, 15 mL at 12/29/21 0640 ?  metoprolol tartrate (LOPRESSOR) tablet 12.5 mg, 12.5 mg, Oral, BID, Zada Finders R, MD, 12.5 mg at 12/28/21 2155 ?  ondansetron (ZOFRAN) tablet 4 mg, 4 mg, Oral, Q6H PRN **OR** ondansetron (ZOFRAN) injection 4 mg, 4 mg, Intravenous, Q6H PRN, Zada Finders R, MD ?  oxyCODONE (Oxy IR/ROXICODONE) immediate release tablet 2.5-5 mg, 2.5-5 mg, Oral, Q4H PRN, Romana Juniper A, MD, 5 mg at 12/27/21 0013 ?  pantoprazole (PROTONIX) EC tablet 40 mg, 40 mg, Oral, Daily, Danford, Suann Larry, MD, 40 mg at 12/28/21 4944 ?  senna-docusate (Senokot-S) tablet 1 tablet, 1 tablet, Oral, QHS PRN, Zada Finders R, MD ?  sodium chloride flush (NS) 0.9 % injection 3 mL, 3 mL, Intravenous, Q12H, Zada Finders R, MD, 3 mL at 12/28/21 2155 ?  sodium chloride tablet 2 g, 2 g, Oral, BID WC, Danford, Suann Larry, MD, 2 g at 12/28/21 1612 ?  tamsulosin (FLOMAX) capsule 0.4 mg, 0.4 mg, Oral, QHS, Patel, Vishal R, MD, 0.4 mg  at 12/27/21 1952 ?  valproate (DEPACON) 1,500 mg in dextrose 5 % 50 mL IVPB, 1,500 mg, Intravenous, Once **FOLLOWED BY** valproate (DEPACON) 500 mg in dextrose 5 % 50 mL IVPB, 500 mg, Intravenous, Q12H, August Albino, NP ? ? ? ? ?Imaging ?I have reviewed images in epic and the results pertinent to this consultation are: ?CT head negative for acute process ?EEG-normal ?MRI brain without contrast pending ? ?Assessment:  ?88 year old male with a new onset seizure.  Has a history of short-term memory loss-I would suspect that he has some underlying moderate dementia and is  having hospital associated delirium. ?I would recommend definitely getting the MRI since he is extremely dysarthric on exam  ? ?Impression: ?New onset seizure ?Evaluate for stroke ?Outpatient work-up for dementia ? ?Recommendations: ?-Keppra d/c'd. With continued agitation Will change to Valproate  (ordered) ?-Valproate load '1500mg'$  x1 then 500 mg BID (ordered) ?- VPA level in am   ?- MRI brain without contrast ordered  ?- Delirium precautions.  ?- Can use seroquel for agitation - 12.5 mg qHS prn (not ordered) ?-Would recommend discontinuing hydroxyzine-can cause drowsiness, dizziness, slurred speech side effects. (Ordered dc) ?- - Neurology will follow ? ?Beulah Gandy DNP, ACNPC-AG ? ?Attending Neurohospitalist Addendum ?Patient seen and examined with APP/Resident. ?Agree with the history and physical as documented above. ?Agree with the plan as documented, which I helped formulate. ?I have independently reviewed the chart, obtained history, review of systems and examined the patient.I have personally reviewed pertinent head/neck/spine imaging (CT/MRI). ?Plan relayed to Dr. Loleta Books ?Please feel free to call with any questions. ? ?-- ?Amie Portland, MD ?Neurologist ?[facility] ?Pager: [phone] ? ? ?

## 2021-12-30 ENCOUNTER — Inpatient Hospital Stay (HOSPITAL_COMMUNITY): Payer: Medicare Other

## 2021-12-30 DIAGNOSIS — F419 Anxiety disorder, unspecified: Secondary | ICD-10-CM | POA: Diagnosis not present

## 2021-12-30 DIAGNOSIS — Z7189 Other specified counseling: Secondary | ICD-10-CM | POA: Diagnosis not present

## 2021-12-30 DIAGNOSIS — S2242XA Multiple fractures of ribs, left side, initial encounter for closed fracture: Secondary | ICD-10-CM | POA: Diagnosis not present

## 2021-12-30 DIAGNOSIS — R410 Disorientation, unspecified: Secondary | ICD-10-CM | POA: Diagnosis not present

## 2021-12-30 DIAGNOSIS — I639 Cerebral infarction, unspecified: Secondary | ICD-10-CM

## 2021-12-30 DIAGNOSIS — I4891 Unspecified atrial fibrillation: Secondary | ICD-10-CM | POA: Diagnosis not present

## 2021-12-30 DIAGNOSIS — N189 Chronic kidney disease, unspecified: Secondary | ICD-10-CM | POA: Diagnosis not present

## 2021-12-30 DIAGNOSIS — R569 Unspecified convulsions: Secondary | ICD-10-CM | POA: Diagnosis not present

## 2021-12-30 DIAGNOSIS — N1831 Chronic kidney disease, stage 3a: Secondary | ICD-10-CM | POA: Diagnosis not present

## 2021-12-30 LAB — BASIC METABOLIC PANEL
Anion gap: 7 (ref 5–15)
BUN: 25 mg/dL — ABNORMAL HIGH (ref 8–23)
CO2: 23 mmol/L (ref 22–32)
Calcium: 7.9 mg/dL — ABNORMAL LOW (ref 8.9–10.3)
Chloride: 109 mmol/L (ref 98–111)
Creatinine, Ser: 1.19 mg/dL (ref 0.61–1.24)
GFR, Estimated: 57 mL/min — ABNORMAL LOW (ref >60)
Glucose, Bld: 89 mg/dL (ref 70–99)
Potassium: 3.6 mmol/L (ref 3.5–5.1)
Sodium: 139 mmol/L (ref 135–145)

## 2021-12-30 LAB — VALPROIC ACID LEVEL: Valproic Acid Lvl: 54 ug/mL (ref 50.0–100.0)

## 2021-12-30 LAB — CBC
HCT: 33.9 % — ABNORMAL LOW (ref 39.0–52.0)
Hemoglobin: 11 g/dL — ABNORMAL LOW (ref 13.0–17.0)
MCH: 29.1 pg (ref 26.0–34.0)
MCHC: 32.4 g/dL (ref 30.0–36.0)
MCV: 89.7 fL (ref 80.0–100.0)
Platelets: 188 10*3/uL (ref 150–400)
RBC: 3.78 MIL/uL — ABNORMAL LOW (ref 4.22–5.81)
RDW: 15.5 % (ref 11.5–15.5)
WBC: 7.6 10*3/uL (ref 4.0–10.5)
nRBC: 0 % (ref 0.0–0.2)

## 2021-12-30 MED ORDER — POLYETHYLENE GLYCOL 3350 17 G PO PACK
17.0000 g | PACK | Freq: Every day | ORAL | Status: DC
  Administered 2021-12-30 – 2022-01-02 (×3): 17 g via ORAL
  Filled 2021-12-30 (×4): qty 1

## 2021-12-30 MED ORDER — BISACODYL 10 MG RE SUPP
10.0000 mg | Freq: Once | RECTAL | Status: AC
Start: 2021-12-30 — End: 2021-12-30
  Administered 2021-12-30: 10 mg via RECTAL
  Filled 2021-12-30: qty 1

## 2021-12-30 NOTE — Progress Notes (Signed)
?Progress Note ? ? ?Patient: Adam Swanson. ZCH:[id number] DOB: 09-14-1927 DOA: 12/26/2021     4 ?DOS: the patient was seen and examined on 12/30/2021 at 11:20AM ?  ? ? ? ?Brief hospital course: ?Adam Swanson is a 88 y.o. M with CKD stage IIIa, chronic hyponatremia, GERD, BPH, anxiety who presented after a fall and seizure-like activity at his [facility]. ? ?Int he ER, found to have left 6th-9th rib fractures with large left pneumothorax s/p chest tube placement.  Also with new onset A-fib with RVR.    ? ? ?4/18: Admitted, chest tube placed, HR controlled with diltiazem, Neurology consulted for seizure, Trauma consulted for rib fx and PTX ?4/19: Delirious at night ?4/20: Remains delirious ?4/21: Central acting agents adjusted, more sleepy today, not agitated ?4/22: Now alert, MRI brain shows stroke ? ? ? ? ? ? ?Assessment and Plan: ?* Traumatic fracture of ribs with pneumothorax, left, closed, initial encounter ?Chest tube removed today ?-Follow-up chest tube this afternoon ?- Plan for chest filmrepeat in 2-3 weeks  ? ?Delirium ?On second night of admission, patient very agitated and trying to remove chest tube. ? ?Medications adjustment next day, and following night, slept through night and was in fact sedated sleepy all next day and night. ?Today now, he is alert, joking, interactiev and more approrpatie, just slightly disoriented.   ? ?- Continue reduced benzodiazepine dose (gets '2mg'$  PO at night at home) --> here only 1/2 mg ?- Avoid Haldol, increasing benzodiazepines ?- Continue Depakote for seizure prophylaxis, with secondary benefit of mood stabilization ?- if absolutely needed, could use low dose Seroquel ?- Routine delirium precautions ? ?Seizure (Adam Swanson) ?- Consult Neurology ?- Continue new Depakote  ? ? ?Atrial fibrillation with RVR (Adam Swanson) ?New onset  ?Treated with IV Diltiazem in the ER and spontaneously converted to NSR ?Echo with EF 50%, normal valves. TSH normal.  ?CHA2DS2-Vasc 4 ?TSH normal, troponin low and flat,  doubt ACS, no further ischemic work up needed ?- Continue metoprolol ?- Start Eliquis ? ?Stroke Adam Swanson) ?Small subcortical right precentral gyrus infarct. ?-Echocardiogram showed no cardiogenic source of embolism ?-Obtain Carotid imaging  ?-Check Lipids ?-Atrial fibrillation: new onset, start Eliquis ?-tPA not given because outside window ?-PT eval ordered: recommending SNF ?-Smoking cessation: not pertinent  ?  ? ?Hyponatremia ?Na mildly low, at baseline ?-Continue home salt tabs and demeclocycline ? ? ? ? ? ? ? ? ? ?Subjective: Has dry mouth, constipation.  No dyspnea, fever. ? ? ? ? ?Physical Exam: ?Vitals:  ? 12/29/21 2235 12/30/21 2878 12/30/21 0453 12/30/21 0617  ?BP: 107/75 113/73 92/64   ?Pulse: 82 61 84   ?Resp:  20 16   ?Temp:   98.6 ?F (37 ?C)   ?TempSrc:   Axillary   ?SpO2: 92% 94% 93%   ?Weight:    55.8 kg  ?Height:      ? ?Thin elderly male, lying in bed, interactive ?Respond to questions appropriately ?Respiratory effort normal, lungs clear without rales or wheezes, overall slightly diminished ?Heart rate normal, regular rhythm, no murmurs, no peripheral edema ?Abdomen soft no tenderness palpation or guarding ?Attention seems normal, affect, irritable, moves upper extremities with generalized weakness but symmetric strength, speech no longer dysarthric ? ? ? ? ? ?Data Reviewed: ?Discussed with neurology, nursing notes reviewed, vital signs reviewed, speech therapy notes reviewed. ?MRI brain results reviewed, complete blood count shows normal white count and hemoglobin and platelets ?Basic metabolic panel shows stable creatinine, sodium back up to 139 ?Chest x-ray shows  no worsening of pneumothorax, does show stool in the colon ? ?Family Communication:   ? ? ? ?Disposition: ?Status is: Inpatient ? ? ? ? ? ? ? ? ?Author: ?Adam Dada, MD ?12/30/2021 1:50 PM ? ?For on call review [email or web address].  ? ? ?

## 2021-12-30 NOTE — Progress Notes (Signed)
? ?Progress Note ? ?   ?Subjective: ?Pt complaining of feeling thirsty and constipated this AM. He is somewhat confused but was oriented to self, place and time for RN. Chest tube removed at bedside without immediate complication.  ? ?Objective: ?Vital signs in last 24 hours: ?Temp:  [97.4 ?F (36.3 ?C)-98.6 ?F (37 ?C)] 98.6 ?F (37 ?C) (04/22 0453) ?Pulse Rate:  [60-128] 84 (04/22 0453) ?Resp:  [15-20] 16 (04/22 0453) ?BP: (87-118)/(61-84) 92/64 (04/22 0453) ?SpO2:  [91 %-94 %] 93 % (04/22 0453) ?Weight:  [55.8 kg] 55.8 kg (04/22 0617) ?Last BM Date :  (pta) ? ?Intake/Output from previous day: ?04/21 0701 - 04/22 0700 ?In: 996.4 [P.O.:236; I.V.:698.4; IV Piggyback:62] ?Out: 410 [Urine:350; Chest Tube:60] ?Intake/Output this shift: ?No intake/output data recorded. ? ?PE: ?Gen:  Alert, NAD, pleasant ?Card:  Reg rate ?Pulm:  CTAB, no W/R/R, effort normal. L CT in place. 870 SS output since placement, 70/24 hours. No air leak. On WS. Chest tube removed and lungs CTAB s/p removal  ?Abd: Soft, ND, NT, +BS ?Ext:  MAE's. ? ? ?Lab Results:  ?No results for input(s): WBC, HGB, HCT, PLT in the last 72 hours. ?BMET ?Recent Labs  ?  12/28/21 ?7616  ?NA 132*  ?K 3.6  ?CL 103  ?CO2 23  ?GLUCOSE 86  ?BUN 16  ?CREATININE 1.17  ?CALCIUM 8.3*  ? ?PT/INR ?No results for input(s): LABPROT, INR in the last 72 hours. ?CMP  ?   ?Component Value Date/Time  ? NA 132 (L) 12/28/2021 0447  ? K 3.6 12/28/2021 0447  ? CL 103 12/28/2021 0447  ? CO2 23 12/28/2021 0447  ? GLUCOSE 86 12/28/2021 0447  ? BUN 16 12/28/2021 0447  ? CREATININE 1.17 12/28/2021 0447  ? CALCIUM 8.3 (L) 12/28/2021 0447  ? PROT 7.2 12/14/2021 1443  ? ALBUMIN 4.0 12/14/2021 1443  ? AST 20 12/14/2021 1443  ? ALT 14 12/14/2021 1443  ? ALKPHOS 60 12/14/2021 1443  ? BILITOT 0.7 12/14/2021 1443  ? GFRNONAA 58 (L) 12/28/2021 0447  ? GFRAA >60 11/06/2015 0933  ? ?Lipase  ?   ?Component Value Date/Time  ? LIPASE 36 12/14/2021 1443  ? ? ? ? ? ?Studies/Results: ?DG CHEST PORT 1  VIEW ? ?Result Date: 12/29/2021 ?CLINICAL DATA:  88 year old male with history of pneumothorax. EXAM: PORTABLE CHEST - 1 VIEW COMPARISON:  12/28/2021, 12/27/2021 FINDINGS: The mediastinal contours are within normal limits. No cardiomegaly. Similar position of left mid apical pigtail thoracostomy tube. No evidence of residual pneumothorax. Persistent but smaller left lower lobe pulmonary opacity. No evidence of significant pleural effusion. No acute osseous abnormality. IMPRESSION: 1. No evidence of pneumothorax, similar position of left pigtail thoracostomy tube. 2. Continued improvement in left lower lobe pulmonary opacity. Electronically Signed   By: Ruthann Cancer M.D.   On: 12/29/2021 08:10  ? ?DG CHEST PORT 1 VIEW ? ?Result Date: 12/28/2021 ?CLINICAL DATA:  Left pneumothorax EXAM: PORTABLE CHEST 1 VIEW COMPARISON:  Previous studies including the examination of 12/27/2021 FINDINGS: Left chest tube is noted. There is no demonstrable pneumothorax. There is interval decrease in subcutaneous emphysema in the left chest wall. There is marked elevation of left hemidiaphragm. There is interval improvement in aeration of both lower lung fields. Degenerative changes are noted in both shoulders. IMPRESSION: There is no pneumothorax. Left chest tube is noted in place. There is interval improvement in aeration of lower lung fields suggesting resolving atelectasis/pneumonia. Electronically Signed   By: Elmer Picker M.D.   On: 12/28/2021  08:54  ? ?DG Swallowing Func-Speech Pathology ? ?Result Date: 12/29/2021 ?Table formatting from the original result was not included. Images from the original result were not included. Objective Swallowing Evaluation: Type of Study: MBS-Modified Barium Swallow Study  Patient Details Name: Adam Swanson. MRN: [id number] Date of Birth: [birth year] Today's Date: 12/29/2021 Time: SLP Start Time (ACUTE ONLY): 4196 -SLP Stop Time (ACUTE ONLY): 2229 SLP Time Calculation (min) (ACUTE ONLY): 20 min  Past Medical History: Past Medical History: Diagnosis Date  Anxiety   Colon polyp   hyperplastic  Diverticulosis of colon (without mention of hemorrhage)   Esophageal stricture   GERD (gastroesophageal reflux disease)   Hiatal hernia   Lower extremity weakness   OCD (obsessive compulsive disorder)   Viral hepatitis 08/1983 Past Surgical History: Past Surgical History: Procedure Laterality Date  COLONOSCOPY    HEMORRHOID SURGERY    1965 HPI: 88yo male admitted 12/26/21 after a fall and questionable seizure activity. Admission complicated by agitation, confusion and progressive dysarthria.  Sustained 6th-9th rib fractures with large left pneumothorax s/p chest tube placement. PMH: baseline confusion and STM issues. CKD, BPH, GERD, anxiety, OCD, anxiety, esophageal stricture, hiatal hernia. MRI pending. CXR = interval improvement in aeration of BLL suggesting resolving atelectasis/PNA  Subjective: lethargic  Recommendations for follow up therapy are one component of a multi-disciplinary discharge planning process, led by the attending physician.  Recommendations may be updated based on patient status, additional functional criteria and insurance authorization. Assessment / Plan / Recommendation   12/29/2021   9:00 AM Clinical Impressions Clinical Impression Pt presents with a mod-severe dysphagia marked by generalized weakness that is impacting all aspects of the swallow.  Demonstrated immediate aspiration of thin and nectar thick liquids due to delayed laryngeal closure.  There was poor pharyngeal and tongue-base contraction, leading to residue throughout the pharynx that spilled into the airway after the swallow. Pt was drowsy during study; had low energy and difficulty following commands. Unfortunately, given today's performance and aspiration of all trials, the safest option is to remain NPO.  Critical meds could be crushed and given in puree - he is at risk for aspirating this as well.  If aligned with pt's wishes,  one could consider a cortrak as a temporary measure until his mental status improves.  D/W MD and RN. SLP Visit Diagnosis Dysphagia, oropharyngeal phase (R13.12) Impact on safety and function Severe aspiration risk     12/29/2021   9:00 AM Treatment Recommendations Treatment Recommendations Therapy as outlined in treatment plan below     12/29/2021   9:00 AM Prognosis Prognosis for Safe Diet Advancement Fair Barriers to Reach Goals Cognitive deficits   12/29/2021   9:00 AM Diet Recommendations SLP Diet Recommendations Alternative means - temporary;NPO Medication Administration Crushed with puree     12/29/2021   9:00 AM Other Recommendations Oral Care Recommendations Oral care QID Other Recommendations Have oral suction available Follow Up Recommendations Follow physician's recommendations for discharge plan and follow up therapies Assistance recommended at discharge Frequent or constant Supervision/Assistance Functional Status Assessment Patient has had a recent decline in their functional status and/or demonstrates limited ability to make significant improvements in function in a reasonable and predictable amount of time   12/29/2021   9:00 AM Frequency and Duration  Speech Therapy Frequency (ACUTE ONLY) min 1 x/week Treatment Duration 2 weeks     12/29/2021   9:00 AM Oral Phase Oral Phase Impaired Oral - Nectar Teaspoon NT Oral - Nectar Cup Weak lingual manipulation;Premature spillage  Oral - Nectar Straw Weak lingual manipulation;Premature spillage Oral - Thin Teaspoon NT Oral - Thin Cup Weak lingual manipulation;Premature spillage Oral - Thin Straw Weak lingual manipulation;Premature spillage Oral - Puree Weak lingual manipulation Oral - Mech Soft NT Oral - Regular NT Oral - Multi-Consistency NT Oral - Pill NT    12/29/2021   9:00 AM Pharyngeal Phase Pharyngeal Phase Impaired Pharyngeal- Honey Teaspoon Delayed swallow initiation-pyriform sinuses;Reduced pharyngeal peristalsis;Reduced tongue base  retraction;Penetration/Apiration after swallow;Pharyngeal residue - valleculae;Pharyngeal residue - pyriform;Trace aspiration Pharyngeal Material enters airway, passes BELOW cords and not ejected out despite cough attempt by patien

## 2021-12-30 NOTE — Progress Notes (Addendum)
Neurology Progress Note ? ? ?S:// ?Patient is sitting up in bed working with Speech Therapist. He is calm and conversant, much improved from yesterday. He is slightly confused but can answer orientation questions correctly and appropriately. His CT was removed this morning and c/o chest pain at insertion site. Tells [place] he has a collapsed lung. No family at bedside  ? ?O:// ?Current vital signs: ?BP 92/64 (BP Location: Left Arm)   Pulse 84   Temp 98.6 ?F (37 ?C) (Axillary)   Resp 16   Ht '5\' 7"'$  (1.702 m)   Wt 55.8 kg   SpO2 93%   BMI 19.27 kg/m?  ?Vital signs in last 24 hours: ?Temp:  [97.4 ?F (36.3 ?C)-98.6 ?F (37 ?C)] 98.6 ?F (37 ?C) (04/22 0453) ?Pulse Rate:  [60-128] 84 (04/22 0453) ?Resp:  [15-20] 16 (04/22 0453) ?BP: (87-118)/(61-84) 92/64 (04/22 0453) ?SpO2:  [91 %-94 %] 93 % (04/22 0453) ?Weight:  [55.8 kg] 55.8 kg (04/22 0617) ? ?General: elderly, awake in NAD ?HEENT: Normocephalic atraumatic ?CVS: Regular rate rhythm ?Respiratory: Breathing well saturating normally on room air ? ?Neurological exam ?Mental status: He is alert and awake. Oriented to person, place and time, but appears slightly confused at times. ?Speech and language: clear ?Cranial nerves II to XII intact ?Motor examination: Decreased range of motion in the left arm but otherwise nonfocal symmetric, states he thinks he fell on that shoulder. antigravity strength in all fours ?Sensation is intact ?Cerebellar exam unable to assess ? ?Medications ? ?Current Facility-Administered Medications:  ?  0.9 %  sodium chloride infusion, , Intravenous, Continuous, Danford, Suann Larry, MD, Last Rate: 125 mL/hr at 12/30/21 0455, New Bag at 12/30/21 0455 ?  acetaminophen (TYLENOL) tablet 1,000 mg, 1,000 mg, Oral, Q6H, Romana Juniper A, MD, 1,000 mg at 12/29/21 1849 ?  bisacodyl (DULCOLAX) suppository 10 mg, 10 mg, Rectal, Once, Barkley Boards R, PA-C ?  demeclocycline (DECLOMYCIN) tablet 150 mg, 150 mg, Oral, BID, Danford, Suann Larry, MD, 150 mg  at 12/29/21 2142 ?  heparin injection 5,000 Units, 5,000 Units, Subcutaneous, Q8H, Lenore Cordia, MD, 5,000 Units at 12/30/21 0612 ?  lidocaine (LIDODERM) 5 % 1 patch, 1 patch, Transdermal, Q24H, Norm Parcel, PA-C, 1 patch at 12/29/21 6468 ?  LORazepam (ATIVAN) tablet 0.5 mg, 0.5 mg, Oral, QHS, Danford, Suann Larry, MD, 0.5 mg at 12/29/21 2142 ?  MEDLINE mouth rinse, 15 mL, Mouth Rinse, BID, Danford, Suann Larry, MD, 15 mL at 12/29/21 2127 ?  metoprolol tartrate (LOPRESSOR) tablet 12.5 mg, 12.5 mg, Oral, BID, Zada Finders R, MD, 12.5 mg at 12/28/21 2155 ?  ondansetron (ZOFRAN) tablet 4 mg, 4 mg, Oral, Q6H PRN **OR** ondansetron (ZOFRAN) injection 4 mg, 4 mg, Intravenous, Q6H PRN, Zada Finders R, MD ?  oxyCODONE (Oxy IR/ROXICODONE) immediate release tablet 2.5-5 mg, 2.5-5 mg, Oral, Q4H PRN, Romana Juniper A, MD, 5 mg at 12/27/21 0013 ?  polyethylene glycol (MIRALAX / GLYCOLAX) packet 17 g, 17 g, Oral, Daily, Johnson, [employer], PA-C ?  senna-docusate (Senokot-S) tablet 1 tablet, 1 tablet, Oral, QHS PRN, Zada Finders R, MD ?  sodium chloride flush (NS) 0.9 % injection 3 mL, 3 mL, Intravenous, Q12H, Zada Finders R, MD, 3 mL at 12/28/21 2155 ?  sodium chloride tablet 2 g, 2 g, Oral, BID WC, Danford, Suann Larry, MD, 2 g at 12/29/21 1849 ?  [COMPLETED] valproate (DEPACON) 1,500 mg in dextrose 5 % 50 mL IVPB, 1,500 mg, Intravenous, Once, Last Rate: 65 mL/hr at 12/29/21 1100,  1,500 mg at 12/29/21 1100 **FOLLOWED BY** valproate (DEPACON) 500 mg in dextrose 5 % 50 mL IVPB, 500 mg, Intravenous, Q12H, Beulah Gandy A, NP, Last Rate: 55 mL/hr at 12/29/21 2222, 500 mg at 12/29/21 2222 ? ? ? ? ?Imaging ?I have reviewed images in epic and the results pertinent to this consultation are: ?CT head negative for acute process ?EEG-normal ?MRI brain without contrast pending ? ?Assessment:  ?88 year old male with a new onset seizure.  Has a history of short-term memory loss-I would suspect that he has some underlying  moderate dementia and is having hospital associated delirium. ?Past 2 days he was very dysarthric and I had recommended MRI to rule out a stroke. ?His exam looks much improved today. ?I suspect that this was all some sort of toxic metabolic encephalopathy versus hospital associated delirium versus side effect of medications. ?Clinically he does not appear to be having a stroke, and if he is reluctant for an MRI, I will would not push him. ? ?Impression: ?New onset seizure ?Evaluate for stroke ?Outpatient work-up for dementia ? ?Recommendations: ?-Continue Valproate 500 mg BID  ?- VPA level 54 ?- MRI brain without contrast -ordered-if he can comfortably complete, that would be great but if not, I would not push for it. ?- Delirium precautions.  ?Inpatient neurology will be available as needed.  Please call with questions. ? ?Beulah Gandy DNP, ACNPC-AG ? ? ?Attending Neurohospitalist Addendum ?Patient seen and examined with APP/Resident. ?Agree with the history and physical as documented above. ?Agree with the plan as documented, which I helped formulate. ?I have independently reviewed the chart, obtained history, review of systems and examined the patient.I have personally reviewed pertinent head/neck/spine imaging (CT/MRI). ?Discussed with Dr. Loleta Books ?Please feel free to call with any questions. ? ?-- ?Amie Portland, MD ?Neurologist ?[facility] ?Pager: [phone] ? ? ?Addendum ?MRI brain reviewed ?There is a question of a punctate area of restricted diffusion in the right hemisphere in the precentral gyrus.  I think this is either chronic stroke or artifactual finding and has no bearing on the patient's current clinical presentation.  I would not pursue any stroke work-up. ?The concerning findings on the brain MRI are parasagittal areas of susceptibility weighted signal which likely reflects old injury versus a vascular malformation. ?I will follow this further with a CTA head and neck. ?Discussed my  plan with Dr. Loleta Books. ? ?-- ?Amie Portland, MD ?Neurologist ?[facility] ?Pager: [phone] ? ?

## 2021-12-30 NOTE — Progress Notes (Addendum)
Around 2215, pt rolled side to side in bed to get on /off bedpan, noted HR 120s on pulse oximetry. Pt not on telemetry. HR stayed 120s for about 10-15 minutes (asymptomatic) then went back to 80s. Night MD notified and order received to place pt back on tele. Initially SR with frequent PACs HR 90s-100s. Then for a couple hours fluctuated between NSR and episodes of afib 110-120s. Then converted to SR and stayed in SR the rest of the night.   ? ?At start of shift, pt oriented to self and place only and delirious. After ativan and valproate given, pt went to sleep. Room made dark and quiet. Pt sleeping more soundly than previous night and is not restless or pulling at lines. Sats fluctuating 89-92% RA when asleep - placed on 1L oxygen and sats >92%. Pt slept well until about 0600. ?

## 2021-12-30 NOTE — Assessment & Plan Note (Addendum)
Small subcortical right precentral gyrus infarct. ?Discussed with Neurology, timing unclear but likely subacute, not symptomatic.  Echo done, and angiogram showed no focal disease, no stenosis in carotids. Nonsmoker.  Dysphagia screening done. ?- Start Eliquis on discharge for new Afib ?- Check lipids ? ? ?

## 2021-12-30 NOTE — Progress Notes (Signed)
Speech Language Pathology Treatment: Dysphagia  ?Patient Details ?Name: Adam Swanson. ?MRN: [id number] ?DOB: Jan 24, 1928 ?Today's Date: 12/30/2021 ?Time: 0340-3524 ?SLP Time Calculation (min) (ACUTE ONLY): 28 min ? ?Assessment / Plan / Recommendation ?Clinical Impression ? Mr. Reppond is more alert and interactive today.  He was participatory, joking with neurologist and asking questions.  Trials of POs were handled better - he was more attentive and followed commands.  Episodes of likely aspiration, leading to explosive coughing, were still present but occurred with less frequency (approximately 25% of time).  Unfortunately, the coughing causes a lot of pain and exhausts him, and it takes him several minutes to recover.  Aspiration was more likely with thin liquids but coughing still occurred with purees and nectars (c/w performance on yesterday's MBS).  Mr. Usman wants to eat and drink - it seems reasonable to resume a dysphagia 1 diet with nectar-thick liquids, exercising caution and assisting with all PO intake. If he coughs, please stop feeding until he recovers.   ? ?Palliative Care met with Mr. Langan' son [name] yesterday; a f/u meeting with both sons is planned.  It is unlikely that his dysphagia will resolve - function may ebb and flow, but risk of aspiration and its adverse consequences will likely remain a part of his overall medical picture. ? ?SLP will follow. D/W RN. ?  ?HPI HPI: 88yo male admitted 12/26/21 after a fall and questionable seizure activity. Admission complicated by agitation, confusion and progressive dysarthria.  Sustained 6th-9th rib fractures with large left pneumothorax s/p chest tube placement, then removed 4/22. PMH: baseline confusion and STM issues. CKD, BPH, GERD, anxiety, OCD, anxiety, esophageal stricture, hiatal hernia. MRI pending. CXR = interval improvement in aeration of BLL suggesting resolving atelectasis/PNA ?  ?   ?SLP Plan ? Continue with current plan of care ? ?  ?   ?Recommendations for follow up therapy are one component of a multi-disciplinary discharge planning process, led by the attending physician.  Recommendations may be updated based on patient status, additional functional criteria and insurance authorization. ?  ? ?Recommendations  ?Diet recommendations: Dysphagia 1 (puree);Nectar-thick liquid ?Liquids provided via: Cup;Straw ?Medication Administration: Crushed with puree ?Supervision: Staff to assist with self feeding ?Compensations: Minimize environmental distractions;Slow rate;Small sips/bites ?Postural Changes and/or Swallow Maneuvers: Seated upright 90 degrees;Upright 30-60 min after meal  ?   ?    ?   ? ? ? ? Oral Care Recommendations: Oral care BID ?Follow Up Recommendations: Follow physician's recommendations for discharge plan and follow up therapies ?Plan: Continue with current plan of care ? ? ? ? ?  ?  ? ?Wylie Russon L. Areal Cochrane, MA CCC/SLP ?Acute Rehabilitation Services ?Office number [phone] ?Pager [phone] ? ?Juan Quam Laurice ? ?12/30/2021, 8:53 AM ?

## 2021-12-30 NOTE — Progress Notes (Signed)
? ?                                                                                                                                                     ?                                                   ?Daily Progress Note  ? ?Patient Name: Adam Swanson.       Date: 12/30/2021 ?DOB: 20-May-1928  Age: 88 y.o. MRN#: [id number] ?Attending Physician: Edwin Dada, * ?Primary Care Physician: Hulan Fess, MD ?Admit Date: 12/26/2021 ? ?Reason for Consultation/Follow-up: Establishing goals of care ? ?Subjective: ?Medical records reviewed. Patient assessed at the bedside. Discussed with RN. He is more alert and talkative today. His two sons, daughter-in-law, and son's girlfriend are present for scheduled family meeting. Dr. Loleta Books was also present during the [place] discussion. ? ?Dr. Loleta Books provided a thorough overview of patient's current illness and chances for recovery with additional interventions including SNF and ongoing medical management. Patient states "I don't know what to do" in regards to next steps to meet his goals of care. Created space and opportunity for patient's thoughts and feelings on his current illness, as he has said both that he wishes to live as long as possible and wants to rest and focus on his comfort rather than participate in PT. He states that his priority is living as long as possible and following medical recommendations to the best of his ability. He understands that he will need additional support moving forward and is agreeable to PT/OT/SLP therapies. Patient does feel that he wishes to feed himself and it is important for his independence and dignity to resume this as soon as possible. He understands the risk of aspiration and importance of cautious and slow advancement of diet to meet his goals of care. ? ?We discussed possibility of decline and whether patient would desire aggressive interventions such as CPR, mechanical ventilation, feeding tube. Patient does not want to  be hand fed and certainly does not want to have a feeding tube. If he is unable to swallow and feed himself on his own, he would not want to prolong his life. We reviewed the option of comfort feeds and hospice philosophy as an alternative at that time. We reviewed outpatient palliative care referral for ongoing support and family is agreeable. A MOST form was introduced and patient states he is too tired to make any further decisions. ? ?Questions and concerns addressed. PMT will continue to support holistically.  ? ?Length of Stay: 4 ? ?Current Medications: ?Scheduled Meds:  ? acetaminophen  1,000 mg Oral Q6H  ? demeclocycline  150  mg Oral BID  ? heparin  5,000 Units Subcutaneous Q8H  ? lidocaine  1 patch Transdermal Q24H  ? LORazepam  0.5 mg Oral QHS  ? mouth rinse  15 mL Mouth Rinse BID  ? metoprolol tartrate  12.5 mg Oral BID  ? polyethylene glycol  17 g Oral Daily  ? sodium chloride flush  3 mL Intravenous Q12H  ? sodium chloride  2 g Oral BID WC  ? ? ?Continuous Infusions: ? valproate sodium 500 mg (12/30/21 1146)  ? ? ?PRN Meds: ?ondansetron **OR** ondansetron (ZOFRAN) IV, oxyCODONE, senna-docusate ? ?Physical Exam ?Vitals and nursing note reviewed.  ?Constitutional:   ?   General: He is not in acute distress. ?Cardiovascular:  ?   Rate and Rhythm: Normal rate.  ?   Pulses: Normal pulses.  ?Skin: ?   General: Skin is warm and dry.  ?Neurological:  ?   Mental Status: He is alert. Mental status is at baseline.  ?Psychiatric:     ?   Mood and Affect: Mood normal.  ?         ? ?Vital Signs: BP 92/64 (BP Location: Left Arm)   Pulse 84   Temp 98.6 ?F (37 ?C) (Axillary)   Resp 16   Ht '5\' 7"'$  (1.702 m)   Wt 55.8 kg   SpO2 93%   BMI 19.27 kg/m?  ?SpO2: SpO2: 93 % ?O2 Device: O2 Device: Nasal Cannula ?O2 Flow Rate: O2 Flow Rate (L/min): 1 L/min ? ?Intake/output summary:  ?Intake/Output Summary (Last 24 hours) at 12/30/2021 1420 ?Last data filed at 12/30/2021 0800 ?Gross per 24 hour  ?Intake 978.35 ml  ?Output 610  ml  ?Net 368.35 ml  ? ?LBM: Last BM Date :  (PTA) ?Baseline Weight: Weight: 61 kg ?Most recent weight: Weight: 55.8 kg ? ?     ?Palliative Assessment/Data: 30% ? ? ? ? ? ?Patient Active Problem List  ? Diagnosis Date Noted  ? Stroke [facility]) 12/30/2021  ? Goals of care, counseling/discussion   ? Delirium 12/28/2021  ? Anemia in chronic kidney disease (CKD) 12/27/2021  ? Dementia without behavioral disturbance ([place]) 12/27/2021  ? Hypokalemia 12/27/2021  ? Atrial fibrillation with RVR ([place]) 12/26/2021  ? Traumatic fracture of ribs with pneumothorax, left, closed, initial encounter 12/26/2021  ? Seizure ([place]) 12/26/2021  ? Anxiety   ? Chronic kidney disease, stage 3a ([place])   ? Gait abnormality 01/30/2017  ? Weakness 01/28/2017  ? Low back pain without sciatica 01/28/2017  ? Bronchopneumonia 09/24/2015  ? Hyponatremia 09/24/2015  ? Abnormal ECG 09/24/2015  ? HCAP (healthcare-associated pneumonia) 09/24/2015  ? Heart murmur 09/24/2015  ? 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 Benign neoplasm of colon 04/09/2011  ? Unspecified constipation 04/09/2011  ? Constipation 01/18/2011  ? Change in bowel function 01/18/2011  ? 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 General symptom  01/18/2011  ? Oropharyngeal dysphagia 01/18/2011  ? DIVERTICULOSIS, COLON 05/14/2007  ? HIATAL HERNIA 03/19/2003  ? ? ?Palliative Care Assessment & Plan  ? ?Patient Profile: ?88 y.o. male with past medical history of CKD stage IIIa, GERD, BPH, anxiety admitted on 12/26/2021 with seizure after a fall.  ?  ?Patient presents from [place] with left 6th-9th rib fractures with large left pneumothorax s/p chest tube placement. Also with new onset A-fib with RVR. Patient hospitalization has also been complicated with agitated delirium. PMT has been consulted to assist with goals of care conversation.  ? ?Assessment: ?Fall ?Rib fractures ?Pneumothorax s/p  CT removal ?Seizure ?Delirium, resolved ?Goals of care  conversation ? ?Recommendations/Plan: ?Full code, patient does not want mechanical ventilation and is uncertain about CPR however he is not ready to update code status today ?Continue current care - patient's goal is for rehabilitation and strengthening to prolong his life ?Patient defers completion of MOST form at this time ?Psychosocial and emotional support provided ?Patient and family agreeable to outpatient palliative care referral, TOC assistance appreciated ?PMT will continue to follow ? ? ?Prognosis: ? Guarded ? ?Discharge Planning: ?[place] for rehab with Palliative care service follow-up ? ? ?Total time: ?I spent 90 minutes in the care of the patient today in the above activities and documenting the encounter. ? ?Prolonged time: Yes ? ?Dorthy Cooler, PA-C ?Palliative Medicine Team ?Team phone # [phone] ? ?Thank you for allowing the Palliative Medicine Team to assist in the care of this patient. Please utilize secure chat with additional questions, if there is no response within 30 minutes please call the above phone number. ? ?Palliative Medicine Team providers are available by phone from 7am to 7pm daily and can be reached through the team cell phone.  ?Should this patient require assistance outside of these hours, please call the patient's attending physician.  ? ? ? ?

## 2021-12-30 NOTE — Progress Notes (Signed)
TRH night cross cover note: ? ?I was notified by RN of patient's current blood pressure 100/67, with request for clarification on whether or not the patient's evening metoprolol should be held, noting that morning dose of metoprolol was held for similar blood pressure, although slightly lower than the above value.  Patient noted to have paroxysmal atrial fibrillation with current heart rates in the 70s.  Not currently on telemetry.  Given the above information, I conveyed that it was okay to hold this evening's dose of metoprolol, with plan to resume this medication as scheduled in the morning.  However, given that two of the patient's beta-blocker doses have been held over the last 24 hours, also asked that tele be resumed overnight for close monitoring in setting of his paroxysmal atrial fibrillation after noting a transient episode of tachycardia this evening with heart rates into the 120s while the patient was up to use the bathroom.  Subsequently, heart rates back into the 80s, , and patient without any acute complaints at this time. Ensuing resumption of telemetry reportedly shows sinus rhythm with intermittent PACs.  ? ? ? ? ?Adam Bertin, DO ?Hospitalist ? ?

## 2021-12-31 ENCOUNTER — Inpatient Hospital Stay (HOSPITAL_COMMUNITY): Payer: Medicare Other

## 2021-12-31 DIAGNOSIS — R410 Disorientation, unspecified: Secondary | ICD-10-CM | POA: Diagnosis not present

## 2021-12-31 DIAGNOSIS — F419 Anxiety disorder, unspecified: Secondary | ICD-10-CM | POA: Diagnosis not present

## 2021-12-31 DIAGNOSIS — R569 Unspecified convulsions: Secondary | ICD-10-CM | POA: Diagnosis not present

## 2021-12-31 DIAGNOSIS — F0390 Unspecified dementia without behavioral disturbance: Secondary | ICD-10-CM | POA: Diagnosis not present

## 2021-12-31 DIAGNOSIS — I4891 Unspecified atrial fibrillation: Secondary | ICD-10-CM | POA: Diagnosis not present

## 2021-12-31 DIAGNOSIS — S270XXA Traumatic pneumothorax, initial encounter: Secondary | ICD-10-CM

## 2021-12-31 DIAGNOSIS — S2242XA Multiple fractures of ribs, left side, initial encounter for closed fracture: Secondary | ICD-10-CM | POA: Diagnosis not present

## 2021-12-31 DIAGNOSIS — Z7189 Other specified counseling: Secondary | ICD-10-CM | POA: Diagnosis not present

## 2021-12-31 DIAGNOSIS — N189 Chronic kidney disease, unspecified: Secondary | ICD-10-CM | POA: Diagnosis not present

## 2021-12-31 MED ORDER — IOHEXOL 350 MG/ML SOLN
100.0000 mL | Freq: Once | INTRAVENOUS | Status: AC | PRN
  Administered 2021-12-31: 100 mL via INTRAVENOUS

## 2021-12-31 MED ORDER — QUETIAPINE FUMARATE 25 MG PO TABS
25.0000 mg | ORAL_TABLET | Freq: Three times a day (TID) | ORAL | Status: DC | PRN
  Administered 2021-12-31 – 2022-01-02 (×2): 25 mg via ORAL
  Filled 2021-12-31 (×3): qty 1

## 2021-12-31 MED ORDER — VALPROATE SODIUM 100 MG/ML IV SOLN
500.0000 mg | Freq: Every day | INTRAVENOUS | Status: DC
  Administered 2022-01-01: 500 mg via INTRAVENOUS
  Filled 2021-12-31: qty 5

## 2021-12-31 MED ORDER — LAMOTRIGINE 25 MG PO TABS
25.0000 mg | ORAL_TABLET | Freq: Every day | ORAL | Status: DC
  Administered 2021-12-31 – 2022-01-01 (×2): 25 mg via ORAL
  Filled 2021-12-31 (×3): qty 1

## 2021-12-31 MED ORDER — HALOPERIDOL LACTATE 5 MG/ML IJ SOLN
5.0000 mg | Freq: Once | INTRAMUSCULAR | Status: DC | PRN
  Filled 2021-12-31: qty 1

## 2021-12-31 NOTE — Progress Notes (Signed)
?  Progress Note ? ? ?Patient: Adam Swanson. KTG:[id number] DOB: 1928-01-09 DOA: 12/26/2021     5 ?DOS: the patient was seen and examined on 12/31/2021 at 10:30AM and 4:30 PM ?  ? ? ? ?Brief hospital course: ?Mr. Aird is a 88 y.o. M with CKD stage IIIa, chronic hyponatremia, GERD, BPH, anxiety who presented after a fall and seizure-like activity at his [facility]. ? ?Int he ER, found to have left 6th-9th rib fractures with large left pneumothorax s/p chest tube placement.  Also with new onset A-fib with RVR.    ? ? ?4/18: Admitted, chest tube placed, HR controlled with diltiazem, Neurology consulted for seizure, Trauma consulted for rib fx and PTX ?4/19: Delirious at night ?4/20: Remains delirious ?4/21: Central acting agents adjusted, more sleepy today, not agitated ?4/22: Now alert, MRI brain shows stroke ?4/23: Pneumothorax reoccurred, and delirious again ? ? ? ? ?Assessment and Plan: ?* Traumatic fracture of ribs with pneumothorax, left, closed, initial encounter ?Chest tube removed yesterday. ?Today, imaging showed recurrence. ?Placed on O2 and encouraged IS and repeat this afternoon shows smaller  ?- Consult Trauma ?- Conservative Mgmt for now ?- Repeat CXR tomorrow AM  ?- Plan for chest film repeat in 2-3 weeks  ? ? ? ?Delirium ?Delirious again ?- Continue reduced benzodiazepine dose (gets '2mg'$  PO at night at home) --> here only 1/2 mg ?- AVOID HALDOL ?- DO NOT INCREASE BENZODIAZEPINES ?- Seroquel 25 mg PRN for agitation ?- Routine delirium precautions ? ?Seizure ([place]) ?- Consult Neurology ?- Transition to lamotrigine ? ?Atrial fibrillation with RVR ([place]) ?- Continue metoprolol ?- Plan to start Eliquis on discharge ? ?Stroke [facility]) ?Small subcortical right precentral gyrus infarct. ?- Start Eliquis on discharge ? ? ? ? ? ? ? ? ?Subjective: This morning feeling well, no complaints, disoriented as to time, eating well.  This afternoon he is delirious and throwing drinks onto the floor. ? ? ? ? ? ? ? ? ?Physical  Exam: ?Vitals:  ? 12/31/21 0600 12/31/21 1219 12/31/21 1221 12/31/21 1229  ?BP: (!) 141/85  (!) 144/86   ?Pulse:   64   ?Resp:   18   ?Temp:   97.9 ?F (36.6 ?C)   ?TempSrc:   Oral   ?SpO2:  90% 93% 96%  ?Weight: 57.7 kg     ?Height:      ? ?Elderly adult male, lying in bed, no acute distress, appears tired ?RRR, no murmurs, no peripheral edema ?Respiratory rate normal, lungs clear without rales or wheezes ?Abdomen soft no tenderness palpation or guarding ?Attention normal, affect blunted, judgment and insight appear mildly impaired ? ?On return later in the afternoon, the patient is restless, confused, pulling out IVs ? ? ? ? ?Data Reviewed: ?Discussed with neurology, nursing notes reviewed, vital signs reviewed ?CT angiogram reviewed ?Labs notable for BMP and complete blood count yesterday normal ?Chest x-ray later this afternoon shows smaller pneumothorax ?Depakote level yesterday normal ? ?Family Communication: Son by phone ? ? ? ?Disposition: ?Status is: Inpatient ? ? ? ? ? ? ? ? ?Author: ?Edwin Dada, MD ?12/31/2021 4:47 PM ? ?For on call review [email or web address].  ? ? ?

## 2021-12-31 NOTE — Progress Notes (Signed)
? ?                                                                                                                                                     ?                                                   ?Daily Progress Note  ? ?Patient Name: Adam Swanson.       Date: 12/31/2021 ?DOB: Aug 13, 1928  Age: 88 y.o. MRN#: [id number] ?Attending Physician: Edwin Dada, * ?Primary Care Physician: Hulan Fess, MD ?Admit Date: 12/26/2021 ? ?Reason for Consultation/Follow-up: Establishing goals of care ? ?Subjective: ?Medical records reviewed. Patient assessed at the bedside. Discussed with RN. He is confused again with mittens in place after pulling out his IV and attempted to get OOB. He tells me he is hungry and does well with this PA assisting with bites of magic cup. Consumed approximately 50% before slight cough was noted. Patient calms significantly and agrees to remain in bed for a nap after this PA assisted with phone call to his son [name]. Tells me he is ashamed and apologetic after learning he threw drinks onto the floor. Reassurance, therapeutic listening and emotional support provided. Assisted with repositioning in bed and dimming lights for a nap. ? ?Provided John with updates on patient's condition today including delirium, CTA results with PTX reoccurring. John verbalizes his understanding and appreciation. Continues to remain hopeful that patient will improve as he did with recent delirium and PTX. Patient would like family to defer visitation until tomorrow and family confirms he usually expresses this when having a difficult day. Patient shares "this is the most unbelievable thing I have ever gone through." ? ?Questions and concerns addressed. PMT will continue to support holistically.  ? ?Length of Stay: 5 ? ?Current Medications: ?Scheduled Meds:  ? acetaminophen  1,000 mg Oral Q6H  ? demeclocycline  150 mg Oral BID  ? heparin  5,000 Units Subcutaneous Q8H  ? lidocaine  1 patch Transdermal Q24H   ? LORazepam  0.5 mg Oral QHS  ? mouth rinse  15 mL Mouth Rinse BID  ? metoprolol tartrate  12.5 mg Oral BID  ? polyethylene glycol  17 g Oral Daily  ? sodium chloride flush  3 mL Intravenous Q12H  ? sodium chloride  2 g Oral BID WC  ? ? ?Continuous Infusions: ? valproate sodium 500 mg (12/31/21 0956)  ? ? ?PRN Meds: ?haloperidol lactate, ondansetron **OR** ondansetron (ZOFRAN) IV, oxyCODONE, senna-docusate ? ?Physical Exam ?Vitals and nursing note reviewed.  ?Constitutional:   ?   General: He is not in acute distress. ?Cardiovascular:  ?  Rate and Rhythm: Normal rate.  ?   Pulses: Normal pulses.  ?Pulmonary:  ?   Effort: Pulmonary effort is normal.  ?Skin: ?   General: Skin is warm and dry.  ?Neurological:  ?   Mental Status: He is alert.  ?   Comments: Delirious   ?Psychiatric:     ?   Judgment: Judgment is impulsive.  ?         ? ?Vital Signs: BP (!) 144/86 (BP Location: Left Arm)   Pulse 64   Temp 97.9 ?F (36.6 ?C) (Oral)   Resp 18   Ht '5\' 7"'$  (1.702 m)   Wt 57.7 kg   SpO2 96%   BMI 19.92 kg/m?  ?SpO2: SpO2: 96 % ?O2 Device: O2 Device: Nasal Cannula ?O2 Flow Rate: O2 Flow Rate (L/min): 3 L/min ? ?Intake/output summary:  ?Intake/Output Summary (Last 24 hours) at 12/31/2021 1512 ?Last data filed at 12/31/2021 1250 ?Gross per 24 hour  ?Intake 290 ml  ?Output 1075 ml  ?Net -785 ml  ? ? ?LBM: Last BM Date :  (PTA) ?Baseline Weight: Weight: 61 kg ?Most recent weight: Weight: 57.7 kg ? ?     ?Palliative Assessment/Data: 30% ? ? ? ? ? ?Patient Active Problem List  ? Diagnosis Date Noted  ? Stroke [facility]) 12/30/2021  ? Goals of care, counseling/discussion   ? Delirium 12/28/2021  ? Anemia in chronic kidney disease (CKD) 12/27/2021  ? Dementia without behavioral disturbance ([place]) 12/27/2021  ? Hypokalemia 12/27/2021  ? Atrial fibrillation with RVR ([place]) 12/26/2021  ? Traumatic fracture of ribs with pneumothorax, left, closed, initial encounter 12/26/2021  ? Seizure ([place]) 12/26/2021  ? Anxiety   ? Chronic kidney  disease, stage 3a ([place])   ? Gait abnormality 01/30/2017  ? Weakness 01/28/2017  ? Low back pain without sciatica 01/28/2017  ? Bronchopneumonia 09/24/2015  ? Hyponatremia 09/24/2015  ? Abnormal ECG 09/24/2015  ? HCAP (healthcare-associated pneumonia) 09/24/2015  ? Heart murmur 09/24/2015  ? 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 Benign neoplasm of colon 04/09/2011  ? Unspecified constipation 04/09/2011  ? Constipation 01/18/2011  ? Change in bowel function 01/18/2011  ? 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 General symptom  01/18/2011  ? Oropharyngeal dysphagia 01/18/2011  ? DIVERTICULOSIS, COLON 05/14/2007  ? HIATAL HERNIA 03/19/2003  ? ? ?Palliative Care Assessment & Plan  ? ?Patient Profile: ?88 y.o. male with past medical history of CKD stage IIIa, GERD, BPH, anxiety admitted on 12/26/2021 with seizure after a fall.  ?  ?Patient presents from [place] with left 6th-9th rib fractures with large left pneumothorax s/p chest tube placement. Also with new onset A-fib with RVR. Patient hospitalization has also been complicated with agitated delirium. PMT has been consulted to assist with goals of care conversation.  ? ?Assessment: ?Fall ?Rib fractures ?Pneumothorax s/p CT removal ?Seizure ?Delirium, resolved ?Goals of care conversation ? ?Recommendations/Plan: ?Full code/full scope treatment ?Will attempt to assist with MOST form completion as able given delirium noted today, goal continues to be for improvement  ?Psychosocial and emotional support provided ?PMT will continue to follow ? ? ?Prognosis: ? Guarded ? ?Discharge Planning: ?[place] for rehab with Palliative care service follow-up ? ? ?Total time: ?I spent 65 minutes in the care of the patient today in the above activities and documenting the encounter. ? ?Prolonged time: Yes ? ?Dorthy Cooler, PA-C ?Palliative Medicine Team ?Team phone # [phone] ? ?Thank you for allowing the Palliative Medicine  Team  to assist in the care of this patient. Please utilize secure chat with additional questions, if there is no response within 30 minutes please call the above phone number. ? ?Palliative Medicine Team providers are available by phone from 7am to 7pm daily and can be reached through the team cell phone.  ?Should this patient require assistance outside of these hours, please call the patient's attending physician.  ? ?Dorthy Cooler, PA-C ?Palliative Medicine Team ?Team phone # [phone] ? ?Thank you for allowing the Palliative Medicine Team to assist in the care of this patient. Please utilize secure chat with additional questions, if there is no response within 30 minutes please call the above phone number. ? ?Palliative Medicine Team providers are available by phone from 7am to 7pm daily and can be reached through the team cell phone.  ?Should this patient require assistance outside of these hours, please call the patient's attending physician.  ? ? ? ?

## 2021-12-31 NOTE — Progress Notes (Addendum)
Neurology Progress Note ? ? ?S:// ?Patient is sitting up in bed drinking coffee. Son and family at bedside, images shown to family & updated by Dr. Rory Percy. Patient had episodes of agitation and confusion last night which seems to have improved today. Still confused by able to answer questions appropriately. MRI was done yesterday and revealed Subacute or remote superficial hemorrhage along the parasagittal ?right frontal parietal convexity. CTA head ordered to evaluate for vessel malformation and did reveal likely AV fistula. Some concern for increased weakness and fatigue towards the end of the day, with patient also endorsing double vision in the evenings. With that and the bulbar weakness he is experiencing will check MG labs today.  ?Also discussed with family that he should have outpatient neurology do a neurocognitive eval to assess for underlying dementia.  ?All questions by family answered and they voiced understanding  ? ?O:// ?Current vital signs: ?BP (!) 141/85   Pulse 74   Temp 97.6 ?F (36.4 ?C) (Oral)   Resp 18   Ht '5\' 7"'$  (1.702 m)   Wt 57.7 kg   SpO2 95%   BMI 19.92 kg/m?  ?Vital signs in last 24 hours: ?Temp:  [97.6 ?F (36.4 ?C)-98 ?F (36.7 ?C)] 97.6 ?F (36.4 ?C) (04/22 2025) ?Pulse Rate:  [66-74] 74 (04/22 2025) ?Resp:  [15-18] 18 (04/22 2025) ?BP: (120-141)/(78-85) 141/85 (04/23 0600) ?SpO2:  [95 %-96 %] 95 % (04/22 2025) ?Weight:  [57.7 kg] 57.7 kg (04/23 0600) ? ?General: elderly, awake in NAD ?HEENT: Normocephalic atraumatic ?CVS: Regular rate rhythm ?Respiratory: Breathing well saturating normally on room air ? ?Neurological exam ?Mental status: He is alert and awake. Oriented to person, place and time, but appears slightly confused at times. ?Speech and language: clear ?Cranial nerves II to XII intact ?Motor examination: Decreased range of motion in the left arm but otherwise nonfocal symmetric, antigravity strength in all fours ?Sensation is intact ?Cerebellar exam unable to  assess ? ?Medications ? ?Current Facility-Administered Medications:  ?  acetaminophen (TYLENOL) tablet 1,000 mg, 1,000 mg, Oral, Q6H, Kae Heller, Chelsea A, MD, 1,000 mg at 12/30/21 1144 ?  demeclocycline (DECLOMYCIN) tablet 150 mg, 150 mg, Oral, BID, Danford, Suann Larry, MD, 150 mg at 12/30/21 2021 ?  haloperidol lactate (HALDOL) injection 5 mg, 5 mg, Intravenous, Once PRN, Howerter, Justin B, DO ?  heparin injection 5,000 Units, 5,000 Units, Subcutaneous, Q8H, Lenore Cordia, MD, 5,000 Units at 12/31/21 0551 ?  lidocaine (LIDODERM) 5 % 1 patch, 1 patch, Transdermal, Q24H, Norm Parcel, PA-C, 1 patch at 12/31/21 4680 ?  LORazepam (ATIVAN) tablet 0.5 mg, 0.5 mg, Oral, QHS, Danford, Suann Larry, MD, 0.5 mg at 12/30/21 2021 ?  MEDLINE mouth rinse, 15 mL, Mouth Rinse, BID, Danford, Suann Larry, MD, 15 mL at 12/30/21 1147 ?  metoprolol tartrate (LOPRESSOR) tablet 12.5 mg, 12.5 mg, Oral, BID, Zada Finders R, MD, 12.5 mg at 12/30/21 2021 ?  ondansetron (ZOFRAN) tablet 4 mg, 4 mg, Oral, Q6H PRN **OR** ondansetron (ZOFRAN) injection 4 mg, 4 mg, Intravenous, Q6H PRN, Zada Finders R, MD ?  oxyCODONE (Oxy IR/ROXICODONE) immediate release tablet 2.5-5 mg, 2.5-5 mg, Oral, Q4H PRN, Romana Juniper A, MD, 5 mg at 12/27/21 0013 ?  polyethylene glycol (MIRALAX / GLYCOLAX) packet 17 g, 17 g, Oral, Daily, Norm Parcel, PA-C, 17 g at 12/30/21 1154 ?  senna-docusate (Senokot-S) tablet 1 tablet, 1 tablet, Oral, QHS PRN, Zada Finders R, MD ?  sodium chloride flush (NS) 0.9 % injection 3 mL, 3 mL,  Intravenous, Q12H, Zada Finders R, MD, 3 mL at 12/30/21 2130 ?  sodium chloride tablet 2 g, 2 g, Oral, BID WC, Danford, Suann Larry, MD, 2 g at 12/31/21 0549 ?  [COMPLETED] valproate (DEPACON) 1,500 mg in dextrose 5 % 50 mL IVPB, 1,500 mg, Intravenous, Once, Last Rate: 65 mL/hr at 12/29/21 1100, 1,500 mg at 12/29/21 1100 **FOLLOWED BY** valproate (DEPACON) 500 mg in dextrose 5 % 50 mL IVPB, 500 mg, Intravenous, Q12H, Beulah Gandy A, NP, Last Rate: 55 mL/hr at 12/30/21 2021, 500 mg at 12/30/21 2021 ? ? ?Imaging ?I have reviewed images in epic and the results pertinent to this consultation are: ?CT head negative for acute process ?EEG-normal ?MRI Brain 4/22: ?1. Suspect small acute or subacute infarct in the subcortical right ?precentral gyrus. ?2. Subacute or remote superficial hemorrhage along the parasagittal ?right frontal parietal convexity where there is a large cortical ?vessel question underlying vascular malformation. ?3. Atrophy and extensive chronic small vessel ischemia. ?4. Truncated study due to patient condition. ? ?CTA head 4/23: ? 1. Left apical pneumothorax. Recent left chest tube but greater than ?expected for radiograph yesterday, recommend follow-up radiograph. Layering pleural effusions. ?2. No emergent vascular finding. Mild for age atherosclerosis ?without flow limiting stenosis of major vessels. ?3. Abnormal cortical vessels in the region of chronic blood products ?(by prior MRI) in the parasagittal right parietal region, 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. ? ?Assessment:  ?88 year old male with a new onset seizure.  Has a history of short-term memory loss-I would suspect that he has some underlying moderate dementia and is having hospital associated delirium. ?Past 2 days he was very dysarthric and I had recommended MRI to rule out a stroke. ?Concern for small punctate stroke on MRI-which I think is incidental and cannot explain the extent of his dysphagia.  Noticed small area of chronic blood products and on follow-up CT angiography looks like a small AV fistula. ?That might have been the nidus for his seizure. ?That said, dysphagia still is out of proportion to anything on scans that can explain the dysphagia.  Reports fatigability towards the end of the day and also diplopia at the end of the day.  Concern for myasthenia. ?Also has new onset atrial fibrillation ? ?Impression: ?New onset seizure ?Cerebral AV  fistula ?Evaluate for myasthenia gravis due to dysphagia ?New onset A-fib ?Possible underlying dementia unmasked during the hospitalization ? ?Recommendations: ?[facility] already orderd-treatment for myasthenia if labs come back positive ?-He will need to be on anticoagulation for his atrial fibrillation.  I have discussed in detail with his son [name] that putting him on anticoagulation will have higher chances of bleed given his MRI findings above.  I also discussed with them that Depakote has an interaction with most of the DOACs and we will have to switch his antiepileptics. ?-I would recommend lamotrigine as the antiepileptic for him-titration per the schedule below: ?Lamotrigine Morning Evening  ?Week 1  '25mg'$   ?Week 2 '25mg'$  '25mg'$   ?Week 3 '50mg'$  '25mg'$   ?Week 4 '50mg'$  '50mg'$   ?Week 5 '75mg'$  '50mg'$   ?Week 6 '75mg'$  '75mg'$   ?Week 7 '100mg'$  '75mg'$   ?Week 8 '100mg'$  '100mg'$   ?-TAPER depakote with 500 daily for 3 days and then stop. ?- Delirium precautions.  ?- Can consider low dose seroquel 12.'5mg'$  HS for agitation  ?- Will need outpatient follow up with neurology for follow-up and formal neurocognitive eval  ? ?Beulah Gandy DNP, ACNPC-AG ? ? ?Attending Neurohospitalist Addendum ?Patient seen and  examined with APP/Resident. ?Agree with the history and physical as documented above. ?Agree with the plan as documented, which I helped formulate. ?I have independently reviewed the chart, obtained history, review of systems and examined the patient.I have personally reviewed pertinent head/neck/spine imaging (CT/MRI). ?Discussed my plan in detail over the phone with Dr. Loleta Books and with patient's sons Zerick Prevette at bedside earlier in the morning and Jerrit Horen over the phone. ?Please feel free to call with any questions. ? ?-- ?Amie Portland, MD ?Neurologist ?[facility] ?Pager: [phone] ? ? ?

## 2021-12-31 NOTE — Progress Notes (Addendum)
Follow up chest x-ray yesterday afternoon s/p chest tube removal stable without pneumothorax. Pain from rib fractures has been well controlled. Patient clear for discharge from a trauma standpoint when otherwise medically cleared. No other recommendations from a trauma standpoint, trauma will sign off. Order for outpatient chest x-ray in 3-4 weeks already placed. Please let us know if we can be of further assistance.  ? ?Norm Parcel, PA-C ?[facility] ?12/31/2021, 7:42 AM ?Please see Amion for pager number during day hours 7:00am-4:30pm ? ? ? ?Progress Note ? ?   ?Subjective: ?Notified by primary attending of findings of PTX on CTA neck this AM. Reviewed follow up CXR, final read still pending but PTX does appear to be present on prelim read. Patient examined and is not having labored breathing and does not appear to be SOB. He was trying to get up to use the restroom with NT. No IS present in room.  ? ?Objective: ?Vital signs in last 24 hours: ?Temp:  [97.6 ?F (36.4 ?C)-98 ?F (36.7 ?C)] 97.6 ?F (36.4 ?C) (04/22 2025) ?Pulse Rate:  [66-74] 74 (04/22 2025) ?Resp:  [15-18] 18 (04/22 2025) ?BP: (120-141)/(78-85) 141/85 (04/23 0600) ?SpO2:  [95 %-96 %] 95 % (04/22 2025) ?Weight:  [57.7 kg] 57.7 kg (04/23 0600) ?Last BM Date :  (PTA) ? ?Intake/Output from previous day: ?04/22 0701 - 04/23 0700 ?In: 1011.1 [P.O.:100; I.V.:798.2; IV Piggyback:112.9] ?Out: 776 [Urine:775; Stool:1] ?Intake/Output this shift: ?Total I/O ?In: 120 [P.O.:120] ?Out: -  ? ?PE: ?Gen:  Alert, NAD, pleasant ?Card:  Reg rate ?Pulm: lung sounds slightly diminished but audible in apex, effort normal. Ecchymosis of left chest/flank. Occlusive dressing to L chest wall intact  ?Abd: Soft, ND, NT, +BS ?Ext:  MAE's. ? ? ?Lab Results:  ?Recent Labs  ?  12/30/21 ?0731  ?WBC 7.6  ?HGB 11.0*  ?HCT 33.9*  ?PLT 188  ? ?BMET ?Recent Labs  ?  12/30/21 ?0731  ?NA 139  ?K 3.6  ?CL 109  ?CO2 23  ?GLUCOSE 89  ?BUN 25*  ?CREATININE 1.19  ?CALCIUM 7.9*   ? ?PT/INR ?No results for input(s): LABPROT, INR in the last 72 hours. ?CMP  ?   ?Component Value Date/Time  ? NA 139 12/30/2021 0731  ? K 3.6 12/30/2021 0731  ? CL 109 12/30/2021 0731  ? CO2 23 12/30/2021 0731  ? GLUCOSE 89 12/30/2021 0731  ? BUN 25 (H) 12/30/2021 0731  ? CREATININE 1.19 12/30/2021 0731  ? CALCIUM 7.9 (L) 12/30/2021 0731  ? PROT 7.2 12/14/2021 1443  ? ALBUMIN 4.0 12/14/2021 1443  ? AST 20 12/14/2021 1443  ? ALT 14 12/14/2021 1443  ? ALKPHOS 60 12/14/2021 1443  ? BILITOT 0.7 12/14/2021 1443  ? GFRNONAA 57 (L) 12/30/2021 0731  ? GFRAA >60 11/06/2015 0933  ? ?Lipase  ?   ?Component Value Date/Time  ? LIPASE 36 12/14/2021 1443  ? ? ? ? ? ?Studies/Results: ?CT ANGIO HEAD NECK W WO CM ? ?Result Date: 12/31/2021 ?CLINICAL DATA:  Neuro deficit with acute stroke suspected EXAM: CT ANGIOGRAPHY HEAD AND NECK TECHNIQUE: Multidetector CT imaging of the head and neck was performed using the standard protocol during bolus administration of intravenous contrast. Multiplanar CT image reconstructions and MIPs were obtained to evaluate the vascular anatomy. Carotid stenosis measurements (when applicable) are obtained utilizing NASCET criteria, using the distal internal carotid diameter as the denominator. RADIATION DOSE REDUCTION: This exam was performed according to the departmental dose-optimization program which includes automated exposure control, adjustment of  the mA and/or kV according to patient size and/or use of iterative reconstruction technique. CONTRAST:  163m OMNIPAQUE IOHEXOL 350 MG/ML SOLN COMPARISON:  Brain MRI from yesterday FINDINGS: CT HEAD FINDINGS Brain: No visible acute infarct by CT. Extensive chronic small vessel ischemia in the hemispheric white matter. No acute hemorrhage, hydrocephalus, or masslike finding Vascular: No hyperdense vessel or unexpected calcification. Skull: Normal. Negative for fracture or focal lesion. Sinuses: Negative Orbits: Bilateral cataract resection Review of the MIP  images confirms the above findings CTA NECK FINDINGS Aortic arch: Atheromatous plaque with 3 vessel branching. Right carotid system: Mild for age atheromatous plaque without stenosis or ulceration Left carotid system: Limited atheromatous changes without stenosis or ulceration. Vertebral arteries: Proximal subclavian atherosclerosis without stenosis. Codominant vertebral arteries that are smoothly contoured and widely patent to the dura. Skeleton: No acute finding Other neck: No acute finding Upper chest: Left pneumothorax which is small or moderate, incompletely covered. Layering pleural effusions. Recent left chest tube although no left pneumothorax with seen on radiograph yesterday. Findings relate to clinical team in epic chat and chest x-ray follow-up will be obtained. Review of the MIP images confirms the above findings CTA HEAD FINDINGS Anterior circulation: No significant stenosis, proximal occlusion, or aneurysm. At site of chronic blood products in the parasagittal right parietal region there is accentuated cortical vessels, 1 being mildly dilated and seemingly connected to both the superior sagittal sinus and arterial cortex branches. Negative for aneurysm. Posterior circulation: The carotid and vertebral arteries are smoothly contoured and diffusely patent. No branch occlusion, beading, or aneurysm. Venous sinuses: Unremarkable for contrast phase. Borderline early enhancement at the superior sagittal sinus near the previously described vessel. Anatomic variants: None significant Review of the MIP images confirms the above findings IMPRESSION: 1. Left apical pneumothorax. Recent left chest tube but greater than expected for radiograph yesterday, recommend follow-up radiograph. Layering pleural effusions. 2. No emergent vascular finding. Mild for age atherosclerosis without flow limiting stenosis of major vessels. 3. Abnormal cortical vessels in the region of chronic blood products (by prior MRI) in the  parasagittal right parietal region, 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. Electronically Signed   By: JJorje GuildM.D.   On: 12/31/2021 09:05  ? ?MR BRAIN WO CONTRAST ? ?Result Date: 12/30/2021 ?CLINICAL DATA:  New onset seizure. EXAM: MRI HEAD WITHOUT CONTRAST TECHNIQUE: Multiplanar, multiecho pulse sequences of the brain and surrounding structures were obtained without intravenous contrast. COMPARISON:  Head CT from 4 days ago FINDINGS: Truncated study due to patient condition. Small area of posterior right frontal subcortical diffusion hyperintensity on 2:43. Prominent area of hypointensity along the parasagittal right posterior frontal and parietal cortex which has a roughly serpentine appearance. There is an abnormally large sulcal vessel at this level which is chronic. Mild adjacent diffusion alteration at this level is likely related to susceptibility artifact. No associated swelling or detected gliosis. Generalized brain atrophy. No hydrocephalus or collection. Confluent chronic small vessel ischemia in the deep white matter. Major flow voids are preserved. Negative sinuses.  Bilateral cataract resection. IMPRESSION: 1. Suspect small acute or subacute infarct in the subcortical right precentral gyrus. 2. Subacute or remote superficial hemorrhage along the parasagittal right frontal parietal convexity where there is a large cortical vessel question underlying vascular malformation. 3. Atrophy and extensive chronic small vessel ischemia. 4. Truncated study due to patient condition. Electronically Signed   By: JJorje GuildM.D.   On: 12/30/2021 10:40  ? ?DG CHEST PORT 1 VIEW ? ?Result Date: 12/30/2021 ?CLINICAL DATA:  Fall. EXAM: PORTABLE CHEST 1 VIEW COMPARISON:  AP chest 12/30/2021, 12/29/2021, 12/28/2021 FINDINGS: Cardiac silhouette is again mildly enlarged. Mediastinal contours are grossly within normal limits. Moderately decreased lung volumes. Interval removal of the prior small bore left chest tube. Left basilar  heterogeneous airspace opacities unchanged. Probable small left and trace right pleural effusions are unchanged. No pneumothorax. 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 O

## 2021-12-31 NOTE — Progress Notes (Signed)
TRH night cross cover note: ? ?I was notified by RN of the patient's increasing confusion over the course of this night shift, with the patient frequently getting out of bed and attempting to ambulate in his room.  At this time, verbal redirection has been effective.  I have also placed an order for Haldol 5 mg IV once prn for agitation should ensuing verbal redirection be less effective. ? ? ? ? ?Babs Bertin, DO ?Hospitalist ? ?

## 2021-12-31 NOTE — Progress Notes (Signed)
Pt threw drinks on the floor and asking to call his son.  ?Nurse dialed sons number [name]) and gave to pt.  ?Floor cleaned. Bed alarm and call bell within reach.  ? ?

## 2021-12-31 NOTE — Progress Notes (Signed)
Pt removed peripheral iv and attempting to get out of bed. Pt removed oxygen [place] and oximeter. Dressing placed.   ?This nurse placed  and oximeter back.  ?Sit up pt to eat lunch with bed alarm in place and call bell within reach. ? ?

## 2022-01-01 ENCOUNTER — Inpatient Hospital Stay (HOSPITAL_COMMUNITY): Payer: Medicare Other

## 2022-01-01 DIAGNOSIS — F419 Anxiety disorder, unspecified: Secondary | ICD-10-CM | POA: Diagnosis not present

## 2022-01-01 DIAGNOSIS — N189 Chronic kidney disease, unspecified: Secondary | ICD-10-CM | POA: Diagnosis not present

## 2022-01-01 DIAGNOSIS — I4891 Unspecified atrial fibrillation: Secondary | ICD-10-CM | POA: Diagnosis not present

## 2022-01-01 DIAGNOSIS — S2242XA Multiple fractures of ribs, left side, initial encounter for closed fracture: Secondary | ICD-10-CM | POA: Diagnosis not present

## 2022-01-01 DIAGNOSIS — S270XXA Traumatic pneumothorax, initial encounter: Secondary | ICD-10-CM | POA: Diagnosis not present

## 2022-01-01 LAB — ACETYLCHOLINE RECEPTOR, BINDING: Acety choline binding ab: <0.03 nmol/L (ref 0.00–0.24)

## 2022-01-01 MED ORDER — LORAZEPAM 0.5 MG PO TABS
0.2500 mg | ORAL_TABLET | Freq: Every day | ORAL | Status: DC
  Administered 2022-01-01: 0.25 mg via ORAL
  Filled 2022-01-01: qty 1

## 2022-01-01 MED ORDER — DIVALPROEX SODIUM ER 500 MG PO TB24: 500.0000 mg | ORAL_TABLET | Freq: Every day | ORAL | Status: DC

## 2022-01-01 MED ORDER — DIVALPROEX SODIUM 125 MG PO CSDR
500.0000 mg | DELAYED_RELEASE_CAPSULE | Freq: Every day | ORAL | Status: DC
  Administered 2022-01-01: 500 mg via ORAL
  Filled 2022-01-01: qty 4

## 2022-01-01 MED ORDER — DIVALPROEX SODIUM ER 500 MG PO TB24
500.0000 mg | ORAL_TABLET | Freq: Every day | ORAL | Status: DC
Start: 2022-01-01 — End: 2022-01-01

## 2022-01-01 NOTE — TOC Progression Note (Addendum)
Transition of Care (TOC) - Progression Note  ? ? ?Patient Details  ?Name: Adam Swanson. ?MRN: [id number] ?Date of Birth: 05/09/1928 ? ?Transition of Care (TOC) CM/SW Contact  ?Latria Mccarron Renold Don, LCSWA ?Phone Number: ?01/01/2022, 12:54 PM ? ?Clinical Narrative:    ?Pt has a bed at [facility], pt is not medically stable for DC today. CSW contacted pt son to provide update on pt dc status. CSW will continue to follow for any DC needs. ? ? ?  ?  ? ?Expected Discharge Plan and Services ?  ?  ?  ?  ?  ?                ?  ?  ?  ?  ?  ?  ?  ?  ?  ?  ? ? ?Social Determinants of Health (SDOH) Interventions ?  ? ?Readmission Risk Interventions ?   ? View : No data to display.  ?  ?  ?  ? ? ?

## 2022-01-01 NOTE — Progress Notes (Addendum)
Neurology Progress Note ? ? ?Subjective ? Seen and examined during round this morning, awake, alert and cooperative with care.Patient is sitting up in bed eating breakfast with assistance from a nursing student at bedside.  Patient was able to answer questions appropriately as to his name, place and when asked how old he is, he replied "too old-89". RN at bedside to administer his medication and she reported that patient slept well through the night without any agitation reported. And that the Seroquel 25 mg po as needed for agitation is working per [employer]. ?MRI was done on 12/30/2021 revealed Subacute or remote superficial hemorrhage along the parasagittal right frontal parietal convexity where there is a large cortical ?vessel question underlying vascular malformation. ? CT Angiogram Head/Neck  ordered to evaluate for vessel malformation and did reveal likely  small AV fistula.  ?Updated patient and RN at bedside on plan of care which will include tapering Ativan dose  to 0.25 mg at bedtime with a goa of weaning him off prior to discharge given the fact that Ativan can increase agitation, confusion and increase the risk of falls in some elderly and since Seroquel is  working well, we may put him on scheduled dose of Seroquel '25mg'$  po at bedtime.And '25mg'$  po BID as needed for agitation and dose  ?I called and spoke with patient's son Adam Swanson ) over the phone and gave him update on plan of care including weaning patient off Ativan and continue on Seroquel . ?  ? ?Obejective ?Current vital signs: ?BP (!) 127/91 (BP Location: Left Wrist)   Pulse 71   Temp 98.7 ?F (37.1 ?C) (Oral)   Resp 16   Ht '5\' 7"'$  (1.702 m)   Wt 58.4 kg   SpO2 94%   BMI 20.16 kg/m?  ?Vital signs in last 24 hours: ?Temp:  [97.9 ?F (36.6 ?C)-98.7 ?F (37.1 ?C)] 98.7 ?F (37.1 ?C) (04/24 0600) ?Pulse Rate:  [64-79] 71 (04/24 0600) ?Resp:  [16-18] 16 (04/24 0600) ?BP: (127-148)/(86-106) 127/91 (04/24 0600) ?SpO2:  [90 %-96 %] 94 % (04/24  0600) ?Weight:  [58.4 kg] 58.4 kg (04/24 0500) ? ?General: elderly, awake in NAD ?HEENT: Normocephalic atraumatic ?CVS: Regular rate rhythm ?Respiratory: Breathing well saturating normally on 2L/[place] ? ?Neurological exam ?Mental status: He is alert and awake. Oriented to person, place and time. Good informant, no confusion or agitation noted ?Speech and language: clear, no dysarthria ?Cranial nerves II to XII intact ?Motor examination: Decreased range of motion in the left arm but otherwise nonfocal symmetric, antigravity strength in all fours ?Sensation is intact ?Cerebellar exam unable to assess ? ?Medications ? ?Current Facility-Administered Medications:  ?  acetaminophen (TYLENOL) tablet 1,000 mg, 1,000 mg, Oral, Q6H, Kae Heller, Chelsea A, MD, 1,000 mg at 01/01/22 5009 ?  demeclocycline (DECLOMYCIN) tablet 150 mg, 150 mg, Oral, BID, Danford, Suann Larry, MD, 150 mg at 01/01/22 3818 ?  heparin injection 5,000 Units, 5,000 Units, Subcutaneous, Q8H, Lenore Cordia, MD, 5,000 Units at 01/01/22 [id number] ?  lamoTRIgine (LAMICTAL) tablet 25 mg, 25 mg, Oral, QHS, Amie Portland, MD, 25 mg at 12/31/21 2027 ?  lidocaine (LIDODERM) 5 % 1 patch, 1 patch, Transdermal, Q24H, Norm Parcel, PA-C, 1 patch at 01/01/22 7169 ?  LORazepam (ATIVAN) tablet 0.5 mg, 0.5 mg, Oral, QHS, Danford, Suann Larry, MD, 0.5 mg at 12/31/21 2028 ?  MEDLINE mouth rinse, 15 mL, Mouth Rinse, BID, Danford, Suann Larry, MD, 15 mL at 01/01/22 0902 ?  metoprolol tartrate (LOPRESSOR) tablet 12.5 mg, 12.5  mg, Oral, BID, Zada Finders R, MD, 12.5 mg at 01/01/22 3614 ?  ondansetron (ZOFRAN) tablet 4 mg, 4 mg, Oral, Q6H PRN **OR** ondansetron (ZOFRAN) injection 4 mg, 4 mg, Intravenous, Q6H PRN, Zada Finders R, MD ?  oxyCODONE (Oxy IR/ROXICODONE) immediate release tablet 2.5-5 mg, 2.5-5 mg, Oral, Q4H PRN, Romana Juniper A, MD, 5 mg at 12/27/21 0013 ?  polyethylene glycol (MIRALAX / GLYCOLAX) packet 17 g, 17 g, Oral, Daily, Norm Parcel, PA-C, 17 g at  01/01/22 4315 ?  QUEtiapine (SEROQUEL) tablet 25 mg, 25 mg, Oral, TID PRN, Edwin Dada, MD, 25 mg at 12/31/21 2028 ?  senna-docusate (Senokot-S) tablet 1 tablet, 1 tablet, Oral, QHS PRN, Zada Finders R, MD ?  sodium chloride flush (NS) 0.9 % injection 3 mL, 3 mL, Intravenous, Q12H, Lenore Cordia, MD, 3 mL at 01/01/22 0853 ?  sodium chloride tablet 2 g, 2 g, Oral, BID WC, Danford, Suann Larry, MD, 2 g at 01/01/22 4008 ?  valproate (DEPACON) 500 mg in dextrose 5 % 50 mL IVPB, 500 mg, Intravenous, Daily, Amie Portland, MD, Last Rate: 55 mL/hr at 01/01/22 0855, 500 mg at 01/01/22 0855 ? ? ?Imaging ?I have reviewed images in epic and the results pertinent to this consultation are: ?CT head negative for acute process ?EEG ON 12/27/2021 demonstrates no seizures or epileptiform discharges were seen throughout the recording ? ?MRI Brain 4/22: Personally reviewed by attending MD ?1. Suspect small acute or subacute infarct in the subcortical right ?precentral gyrus. ?2. Subacute or remote superficial hemorrhage along the parasagittal ?right frontal parietal convexity where there is a large cortical ?vessel question underlying vascular malformation. ?3. Atrophy and extensive chronic small vessel ischemia. ?4. Truncated study due to patient condition. ? ?CTA head 4/23: Personally reviewed by attending MD ? 1. Left apical pneumothorax. Recent left chest tube but greater than ?expected for radiograph yesterday, recommend follow-up radiograph. Layering pleural effusions. ?2. No emergent vascular finding. Mild for age atherosclerosis ?without flow limiting stenosis of major vessels. ?3. Abnormal cortical vessels in the region of chronic blood products ?(by prior MRI) in the parasagittal right parietal region, 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. ? ?Assessment:  ? Per chart review -88 year old male with  History of mild short-term memory loss ( per son ) who fell the evening of his admission.Patient has been on Ativan 1-'2mg'$  po bid  at home for some years for anxiety and per patient, he lives alone. When asked what brought him to the hospital,Per patient, " I just fainted". ?Neurology service was consulted for new onset seizure. ?  Per chart, reported 2 days of dysarthria, Brain MRI done with concern for small punctate stroke and small area of chronic blood products and on follow-up CT angiography looks like a small AV fistula. ?That might have been the nidus for his seizure. ?That said, dysphagia still is out of proportion to anything on scans that can explain the dysphagia. Reports fatigability towards the end of the day and also diplopia at the end of the day.  Concern for myasthenia. ?Also has new onset atrial fibrillation ? ?Impression: ?New onset seizure ?Cerebral AV fistula ?Increase risk for falls ?New onset A-fib ?Possible underlying dementia unmasked during the hospitalization ? ?Recommendations: ?- [facility]- treatment for myasthenia if labs come back positive ?- He will need to be on anticoagulation for his atrial fibrillation. Given his increase risk for falls and given the AVM findings seen on his MRI he will  be at risk for bleeding. (  Discussed this with his son over the phone ).  At this time we will hold off on initiating anticoagulation, with further discussion on risks and benefits on an outpatient basis in shared decision making with family. ?- Since Ativan can increase agitation, confusion and risk of falls in some elderly, will try to taper his dose to 0.25 mg at bedtime with a goal to wean him off prior to discharge since Seroquel is working well for him as reported by Therapist, sports. ?- Since Seroquel is  working well, we may put him on scheduled dose of Seroquel '25mg'$  po at bedtime.And '25mg'$  po BID as needed for agitation and dose may be titrated up if need be. Check QTc on EKG. ?- Depakote has an interaction with most of the DOACs and we will have to switch his antiepileptics. ?- Continue on  lamotrigine as the antiepileptic for him-titration per the schedule below: ?Lamotrigine Morning Evening  ?Week 1  '25mg'$   ?Week 2 '25mg'$  '25mg'$   ?Week 3 '50mg'$  '25mg'$   ?Week 4 '50mg'$  '50mg'$   ?Week 5 '75mg'$  '50mg'$   ?Week 6 '75mg'$  '75mg'$   ?Week 7 100

## 2022-01-01 NOTE — Plan of Care (Signed)
?  Problem: Clinical Measurements: ?Goal: Ability to maintain clinical measurements within normal limits will improve ?Outcome: Progressing ?Goal: Will remain free from infection ?Outcome: Progressing ?Goal: Diagnostic test results will improve ?Outcome: Progressing ?  ?

## 2022-01-01 NOTE — Progress Notes (Signed)
Speech Language Pathology Treatment: Dysphagia  ?Patient Details ?Name: Adam Swanson. ?MRN: [id number] ?DOB: Dec 07, 1927 ?Today's Date: 01/01/2022 ?Time: 3354-5625 ?SLP Time Calculation (min) (ACUTE ONLY): 15 min ? ?Assessment / Plan / Recommendation ?Clinical Impression ? Pt was seen for dysphagia f/u.  Breakfast tray at bedside, partially eaten.  Adam Swanson was confused but calm; receptive to eating a bit more.  HOB elevated; he was fed several sips of nectar and spoons of pureed pancake.  There was no coughing; encouraged to dry swallow between bites to reduce residue and potential for spillover into airway. He followed instructions.  Declined further POs - sleepy and asking to rest.  ? ?Performance is consistent with that which was observed on Saturday -no deterioration but no marked improvement.  Recommend continuing diet of dysphagia 1/nectars with known aspiration risk.  Palliative care is following closely. ? ?SLP will continue to follow.  ?  ?HPI HPI: 88yo male admitted 12/26/21 after a fall and questionable seizure activity. Admission complicated by agitation, confusion and progressive dysarthria.  Sustained 6th-9th rib fractures with large left pneumothorax s/p chest tube placement, then removed 4/22. PMH: baseline confusion and STM issues. CKD, BPH, GERD, anxiety, OCD, anxiety, esophageal stricture, hiatal hernia. MRI 4/22: small acute or subacute infarct in the subcortical right  precentral gyrus.  2. Subacute or remote superficial hemorrhage along the parasagittal  right frontal parietal convexity where there is a large cortical  vessel question underlying vascular malformation.  3. Atrophy and extensive chronic small vessel ischemia. ?  ?   ?SLP Plan ? Continue with current plan of care ? ?  ?  ?Recommendations for follow up therapy are one component of a multi-disciplinary discharge planning process, led by the attending physician.  Recommendations may be updated based on patient status, additional  functional criteria and insurance authorization. ?  ? ?Recommendations  ?Diet recommendations: Dysphagia 1 (puree);Nectar-thick liquid ?Liquids provided via: Cup;Straw ?Medication Administration: Crushed with puree ?Supervision: Staff to assist with self feeding ?Compensations: Minimize environmental distractions;Slow rate;Small sips/bites ?Postural Changes and/or Swallow Maneuvers: Seated upright 90 degrees;Upright 30-60 min after meal  ?   ?    ?   ? ? ? ? Oral Care Recommendations: Oral care BID ?Follow Up Recommendations: Follow physician's recommendations for discharge plan and follow up therapies ?Assistance recommended at discharge: Frequent or constant Supervision/Assistance ?SLP Visit Diagnosis: Dysphagia, oropharyngeal phase (R13.12) ?Plan: Continue with current plan of care ? ? ? ? ?  ?  ?Adam Swanson L. Tyriana Helmkamp, MA CCC/SLP ?Acute Rehabilitation Services ?Office number [phone] ?Pager [phone] ? ? ?Adam Swanson ? ?01/01/2022, 9:21 AM ?

## 2022-01-01 NOTE — Progress Notes (Signed)
Physical Therapy Treatment ?Patient Details ?Name: Adam Swanson. ?MRN: [id number] ?DOB: 1928-07-10 ?Today's Date: 01/01/2022 ? ? ?History of Present Illness Adam Swanson. is a 88 y.o. male admitted 4/18 after an unwitnessed fall and questionable seizure activity. Admission complicated by agitation, confusion and progressive dysarthria. EEG negative for seizures. MRI pending. PMH: CKD, BPH, GERD, anxiety. ? ?  ?PT Comments  ? ? The pt presents with improved ability to follow commands which allowed for greater participation in therapy session this afternoon. He was able to complete bed mobility with minA, and completed multiple sit-stand transfers with assist as well as steps to [facility] and then to recliner. He was limited by urgent need to use the [facility] and fatigue after short bout of exertion, but is eager to continue mobilizing. He continues to need max cues for safety and sequencing, and demos poor memory of past sessions or SLP session from earlier this morning. Continue to recommend SNF rehab at d/c given current need for increased assist and supervision for safety due to cognitive deficits.  ?   ?Recommendations for follow up therapy are one component of a multi-disciplinary discharge planning process, led by the attending physician.  Recommendations may be updated based on patient status, additional functional criteria and insurance authorization. ? ?Follow Up Recommendations ? Skilled nursing-short term rehab (<3 hours/day) ?  ?  ?Assistance Recommended at Discharge Frequent or constant Supervision/Assistance  ?Patient can return home with the following Two people to help with walking and/or transfers;A lot of help with bathing/dressing/bathroom;Assistance with feeding;Direct supervision/assist for medications management;Direct supervision/assist for financial management;Assist for transportation;Help with stairs or ramp for entrance ?  ?Equipment Recommendations ? None recommended by PT  ?   ?Recommendations for Other Services   ? ? ?  ?Precautions / Restrictions Precautions ?Precautions: Fall;Other (comment) ?Precaution Comments: delirium ?Restrictions ?Weight Bearing Restrictions: No  ?  ? ?Mobility ? Bed Mobility ?Overal bed mobility: Needs Assistance ?Bed Mobility: Supine to Sit ?  ?  ?Supine to sit: Min assist ?  ?  ?General bed mobility comments: minA to guide and cues to sequence, pt assisting well. needs use of bed rails ?  ? ?Transfers ?Overall transfer level: Needs assistance ?Equipment used: Rolling walker (2 wheels) ?Transfers: Sit to/from Stand, Bed to chair/wheelchair/BSC ?Sit to Stand: Mod assist, Min assist ?  ?Step pivot transfers: Min assist, +2 safety/equipment ?  ?  ?  ?General transfer comment: pt able to power up to standing with modA initially but progressed to minA, then able to take steps to [facility] with minA and RW, cues for hands on RW until ready to sit ?  ? ?Ambulation/Gait ?Ambulation/Gait assistance: Min assist, +2 safety/equipment ?Gait Distance (Feet): 4 Feet ?Assistive device: Rolling walker (2 wheels) ?Gait Pattern/deviations: Step-to pattern, Decreased stride length, Shuffle, Trunk flexed ?Gait velocity: decreased ?Gait velocity interpretation: <1.31 ft/sec, indicative of household ambulator ?  ?General Gait Details: cues to keep RW close, pt withtendency to let go of RW prior to reaching chair. Assist to steady in stance and help direct/navigate RW. ? ? ? ?  ?Balance Overall balance assessment: Needs assistance ?Sitting-balance support: Bilateral upper extremity supported, Feet supported ?Sitting balance-Leahy Scale: Fair ?Sitting balance - Comments: minG, UE support with static sitting EOB ?  ?Standing balance support: Bilateral upper extremity supported, During functional activity ?Standing balance-Leahy Scale: Poor ?Standing balance comment: BUE support + minA ?  ?  ?  ?  ?  ?  ?  ?  ?  ?  ?  ?  ? ?  ?  Cognition Arousal/Alertness: Awake/alert ?Behavior During Therapy:  Flat affect, Restless ?Overall Cognitive Status: No family/caregiver present to determine baseline cognitive functioning ?Area of Impairment: Orientation, Safety/judgement, Awareness, Problem solving ?  ?  ?  ?  ?  ?  ?  ?  ?Orientation Level: Disoriented to, Place (pt initially answering hospital, later saying his son "lives here too and come into his room if needed") ?  ?  ?  ?Safety/Judgement: Decreased awareness of safety, Decreased awareness of deficits ?Awareness: Intellectual ?Problem Solving: Slow processing, Decreased initiation, Difficulty sequencing, Requires verbal cues ?General Comments: pt with hx of STm deficits, able to follow simple cues more consistently today but remains intermittently confused and needing redirection. abel to voice needs such as going to the bathroom ?  ?  ? ?  ?Exercises   ? ?  ?General Comments General comments (skin integrity, edema, etc.): VSS on 3L, able to progress to 1L with SpO2 > 96% ?  ?  ? ?Pertinent Vitals/Pain Pain Assessment ?Pain Assessment: Faces ?Faces Pain Scale: Hurts a little bit ?Pain Location: back ?Pain Descriptors / Indicators: Grimacing, Guarding, Moaning ?Pain Intervention(s): Limited activity within patient's tolerance, Monitored during session, Repositioned  ? ? ?Home Living Family/patient expects to be discharged to:: Other (Comment) ?  ?  ?  ?  ?  ?  ?  ?  ?  ?Additional Comments: From [place]  ?  ?Prior Function    ?  ?  ?   ? ?PT Goals (current goals can now be found in the care plan section) Acute Rehab PT Goals ?Patient Stated Goal: to go to the bathroom and get warm ?PT Goal Formulation: With patient ?Time For Goal Achievement: 01/11/22 ?Potential to Achieve Goals: Fair ?Progress towards PT goals: Progressing toward goals ? ?  ?Frequency ? ? ? Min 3X/week ? ? ? ?  ?PT Plan Current plan remains appropriate  ? ? ?   ?AM-PAC PT "6 Clicks" Mobility   ?Outcome Measure ? Help needed turning from your back to your side while in a flat bed  without using bedrails?: A Little ?Help needed moving from lying on your back to sitting on the side of a flat bed without using bedrails?: A Little ?Help needed moving to and from a bed to a chair (including a wheelchair)?: A Lot ?Help needed standing up from a chair using your arms (e.g., wheelchair or bedside chair)?: A Lot ?Help needed to walk in hospital room?: Total ?Help needed climbing 3-5 steps with a railing? : Total ?6 Click Score: 12 ? ?  ?End of Session Equipment Utilized During Treatment: Gait belt;Oxygen ?Activity Tolerance: Patient tolerated treatment well ?Patient left: in chair;with call bell/phone within reach;with chair alarm set;with nursing/sitter in room ?Nurse Communication: Mobility status ?PT Visit Diagnosis: Other abnormalities of gait and mobility (R26.89);Unsteadiness on feet (R26.81);Muscle weakness (generalized) (M62.81);History of falling (Z91.81) ?  ? ? ?Time: 4128-7867 ?PT Time Calculation (min) (ACUTE ONLY): 30 min ? ?Charges:  $Therapeutic Exercise: 8-22 mins ?$Therapeutic Activity: 8-22 mins          ?          ? ?West Carbo, PT, DPT  ? ?Acute Rehabilitation Department ?Pager #: [phone] - [phone] ? ? ?Sandra Cockayne ?01/01/2022, 1:40 PM ? ?

## 2022-01-01 NOTE — Progress Notes (Signed)
AM chest x ray reviewed with slightly decreased left apical PTX. No further interventions recommended and no further follow up chest xray acutely unless clinical change.  Follow up has been arranged.  ?

## 2022-01-01 NOTE — Progress Notes (Signed)
?Progress Note ? ? ?Patient: Gurdeep Keesey. CHY:[id number] DOB: 08/27/1928 DOA: 12/26/2021     6 ?DOS: the patient was seen and examined on 01/01/2022 at 9:38A ?  ? ? ? ?Brief hospital course: ?Mr. Davidow is a 88 y.o. M with CKD stage IIIa, chronic hyponatremia, GERD, BPH, anxiety who presented after a fall and seizure-like activity at his [facility]. ? ?Int he ER, found to have left 6th-9th rib fractures with large left pneumothorax s/p chest tube placement.  Also with new onset A-fib with RVR.    ? ? ?4/18: Admitted, chest tube placed, HR controlled with diltiazem, Neurology consulted for seizure, Trauma consulted for rib fx and PTX ?4/19: Delirious at night ?4/20: Remains delirious ?4/21: Central acting agents adjusted, more sleepy today, not agitated ?4/22: Now alert, MRI brain shows stroke ?4/23: Pneumothorax reoccurred, and delirious again ?4/24: PTX improving spontaneously, mentation better ? ? ? ? ?Assessment and Plan: ?* Traumatic fracture of ribs with pneumothorax, left, closed, initial encounter ?Chest tube removed two days ago. ?yesterday, imaging showed recurrence, so placed on O2, given IS and yesterday evening was improved, this morning, CXR shows improvement again, small PTX only. ? ?I agree with Trauma, likely resolved, no furhter CXR unless symptoms of pain/dyspnea or hypoxia.   ?- Plan for chest film repeat in 2-3 weeks  ? ? ? ?Delirium ?Still disoriented today. ?- Continue reduced benzodiazepine dose (gets '2mg'$  PO at night at home) --> here only 1/2 mg ?- Avoid Haldol, increasing benzodiazepines ?- Low dose seroquel while in hospital at night ?- Routine delirium precautions ? ?Seizure ([place]) ?- Transition to lamotrigine ?- Taper Depakote ? ? ?Atrial fibrillation with RVR ([place]) ?- Continue metoprolol ?- Plan to start Eliquis when depakote discontinued ? ?Stroke [facility]) ?- Start Eliquis on discharge for new Afib ?- Check lipids ? ?Hyponatremia ?-Continue home salt tabs and  demeclocycline ? ? ? ? ? ? ? ? ? ?Subjective: Patient is irritable about having mitts on his hands, he denies headache, chest pain, dyspnea.  He had no fever, respiratory distress, seizures.  He still confused and somewhat disoriented. ? ? ? ? ?Physical Exam: ?Vitals:  ? 12/31/21 1229 12/31/21 2035 01/01/22 0500 01/01/22 0600  ?BP:  (!) 148/106  (!) 127/91  ?Pulse:  79  71  ?Resp:  18  16  ?Temp:  98.3 ?F (36.8 ?C)  98.7 ?F (37.1 ?C)  ?TempSrc:  Axillary  Oral  ?SpO2: 96% 94%  94%  ?Weight:   58.4 kg   ?Height:      ? ?Elderly adult male, lying in bed, covered in blankets, mitts on his hands.  When these meds were removed, the patient is not agitated or fidgeting with his IV.  He is oriented to hospital, self, but forgetful in our conversation, does not remember at the end of our conversation who took his mitts off. ?RRR, no murmurs, no peripheral edema ?Lungs clear without rales or wheezes, lung sounds symmetric, no rales or wheezes ?Abdomen soft without tenderness palpation or guarding ?Attention diminished, affect blunted, judgment insight appear impaired by delirium, moves both extremities with normal strength and coordination, speech fluent, face with right eyelid droop at baseline ? ? ? ? ? ? ?Data Reviewed: ?Discussed with neurology, surgery notes reviewed, nursing notes reviewed, vital signs reviewed ?Chest x-ray personally reviewed, shows improvement in his left apical pneumothorax, slight increased opacity in the left base. ? ? ? ? ? ?Family Communication:  ? ? ? ?Disposition: ?Status is: Inpatient ?Patient  was admitted with multiple rib fractures, associated pneumothorax after a fall.  Also found to have a seizure and new A-fib. ? ?Management of his new seizur stabilized, neurology are tapering off Depakote and plan for long-term treatment with Lamictal. ? ?Management of his A-fib is stabilized, his heart rate is controlled we will transition to Eliquis once his Depakote has been stopped. ? ?Lastly his  pneumothorax appears to have resolved, there is some concern yesterday that did not occurred, but read serial chest x-ray showed that this is stable and improving with conservative measures. ? ?At this point the patient's questions to be able to go back to his rehab facility.  He has delirium which is going to delay discharge, but hopefully by tomorrow he will be stable for discharge ? ? ? ? ? ? ? ?Author: ?Edwin Dada, MD ?01/01/2022 12:32 PM ? ?For on call review [email or web address].  ? ? ?

## 2022-01-02 DIAGNOSIS — I639 Cerebral infarction, unspecified: Secondary | ICD-10-CM | POA: Diagnosis not present

## 2022-01-02 DIAGNOSIS — M6281 Muscle weakness (generalized): Secondary | ICD-10-CM | POA: Diagnosis not present

## 2022-01-02 DIAGNOSIS — Y838 Other surgical procedures as the cause of abnormal reaction of the patient, or of later complication, without mention of misadventure at the time of the procedure: Secondary | ICD-10-CM | POA: Diagnosis present

## 2022-01-02 DIAGNOSIS — J95811 Postprocedural pneumothorax: Secondary | ICD-10-CM | POA: Diagnosis present

## 2022-01-02 DIAGNOSIS — J939 Pneumothorax, unspecified: Secondary | ICD-10-CM | POA: Diagnosis not present

## 2022-01-02 DIAGNOSIS — J811 Chronic pulmonary edema: Secondary | ICD-10-CM | POA: Diagnosis not present

## 2022-01-02 DIAGNOSIS — D631 Anemia in chronic kidney disease: Secondary | ICD-10-CM | POA: Diagnosis not present

## 2022-01-02 DIAGNOSIS — F0390 Unspecified dementia without behavioral disturbance: Secondary | ICD-10-CM | POA: Diagnosis not present

## 2022-01-02 DIAGNOSIS — R2681 Unsteadiness on feet: Secondary | ICD-10-CM | POA: Diagnosis not present

## 2022-01-02 DIAGNOSIS — M150 Primary generalized (osteo)arthritis: Secondary | ICD-10-CM | POA: Diagnosis not present

## 2022-01-02 DIAGNOSIS — J984 Other disorders of lung: Secondary | ICD-10-CM | POA: Diagnosis not present

## 2022-01-02 DIAGNOSIS — J180 Bronchopneumonia, unspecified organism: Secondary | ICD-10-CM | POA: Diagnosis not present

## 2022-01-02 DIAGNOSIS — R4182 Altered mental status, unspecified: Secondary | ICD-10-CM | POA: Diagnosis not present

## 2022-01-02 DIAGNOSIS — N1831 Chronic kidney disease, stage 3a: Secondary | ICD-10-CM | POA: Diagnosis not present

## 2022-01-02 DIAGNOSIS — E222 Syndrome of inappropriate secretion of antidiuretic hormone: Secondary | ICD-10-CM | POA: Diagnosis present

## 2022-01-02 DIAGNOSIS — S270XXD Traumatic pneumothorax, subsequent encounter: Secondary | ICD-10-CM | POA: Diagnosis not present

## 2022-01-02 DIAGNOSIS — R0600 Dyspnea, unspecified: Secondary | ICD-10-CM | POA: Diagnosis not present

## 2022-01-02 DIAGNOSIS — Z515 Encounter for palliative care: Secondary | ICD-10-CM | POA: Diagnosis not present

## 2022-01-02 DIAGNOSIS — R569 Unspecified convulsions: Secondary | ICD-10-CM | POA: Diagnosis not present

## 2022-01-02 DIAGNOSIS — K219 Gastro-esophageal reflux disease without esophagitis: Secondary | ICD-10-CM | POA: Diagnosis present

## 2022-01-02 DIAGNOSIS — I4891 Unspecified atrial fibrillation: Secondary | ICD-10-CM | POA: Diagnosis not present

## 2022-01-02 DIAGNOSIS — M4000 Postural kyphosis, site unspecified: Secondary | ICD-10-CM | POA: Diagnosis not present

## 2022-01-02 DIAGNOSIS — Z743 Need for continuous supervision: Secondary | ICD-10-CM | POA: Diagnosis not present

## 2022-01-02 DIAGNOSIS — J9811 Atelectasis: Secondary | ICD-10-CM | POA: Diagnosis not present

## 2022-01-02 DIAGNOSIS — R531 Weakness: Secondary | ICD-10-CM | POA: Diagnosis not present

## 2022-01-02 DIAGNOSIS — F419 Anxiety disorder, unspecified: Secondary | ICD-10-CM | POA: Diagnosis not present

## 2022-01-02 DIAGNOSIS — E871 Hypo-osmolality and hyponatremia: Secondary | ICD-10-CM | POA: Diagnosis not present

## 2022-01-02 DIAGNOSIS — R279 Unspecified lack of coordination: Secondary | ICD-10-CM | POA: Diagnosis not present

## 2022-01-02 DIAGNOSIS — R0602 Shortness of breath: Secondary | ICD-10-CM | POA: Diagnosis not present

## 2022-01-02 DIAGNOSIS — Z886 Allergy status to analgesic agent status: Secondary | ICD-10-CM | POA: Diagnosis not present

## 2022-01-02 DIAGNOSIS — R410 Disorientation, unspecified: Secondary | ICD-10-CM | POA: Diagnosis not present

## 2022-01-02 DIAGNOSIS — J189 Pneumonia, unspecified organism: Secondary | ICD-10-CM | POA: Diagnosis not present

## 2022-01-02 DIAGNOSIS — R296 Repeated falls: Secondary | ICD-10-CM | POA: Diagnosis not present

## 2022-01-02 DIAGNOSIS — J90 Pleural effusion, not elsewhere classified: Secondary | ICD-10-CM | POA: Diagnosis not present

## 2022-01-02 DIAGNOSIS — Z79899 Other long term (current) drug therapy: Secondary | ICD-10-CM | POA: Diagnosis not present

## 2022-01-02 DIAGNOSIS — R627 Adult failure to thrive: Secondary | ICD-10-CM | POA: Diagnosis not present

## 2022-01-02 DIAGNOSIS — R41841 Cognitive communication deficit: Secondary | ICD-10-CM | POA: Diagnosis not present

## 2022-01-02 DIAGNOSIS — Z86010 Personal history of colonic polyps: Secondary | ICD-10-CM | POA: Diagnosis not present

## 2022-01-02 DIAGNOSIS — Z91030 Bee allergy status: Secondary | ICD-10-CM | POA: Diagnosis not present

## 2022-01-02 DIAGNOSIS — Z9181 History of falling: Secondary | ICD-10-CM | POA: Diagnosis not present

## 2022-01-02 DIAGNOSIS — E785 Hyperlipidemia, unspecified: Secondary | ICD-10-CM | POA: Diagnosis not present

## 2022-01-02 DIAGNOSIS — S2242XA Multiple fractures of ribs, left side, initial encounter for closed fracture: Secondary | ICD-10-CM | POA: Diagnosis not present

## 2022-01-02 DIAGNOSIS — J159 Unspecified bacterial pneumonia: Secondary | ICD-10-CM | POA: Diagnosis not present

## 2022-01-02 DIAGNOSIS — S270XXA Traumatic pneumothorax, initial encounter: Secondary | ICD-10-CM | POA: Diagnosis not present

## 2022-01-02 DIAGNOSIS — N189 Chronic kidney disease, unspecified: Secondary | ICD-10-CM | POA: Diagnosis not present

## 2022-01-02 DIAGNOSIS — W19XXXA Unspecified fall, initial encounter: Secondary | ICD-10-CM | POA: Diagnosis not present

## 2022-01-02 DIAGNOSIS — W010XXA Fall on same level from slipping, tripping and stumbling without subsequent striking against object, initial encounter: Secondary | ICD-10-CM | POA: Diagnosis not present

## 2022-01-02 DIAGNOSIS — F03A11 Unspecified dementia, mild, with agitation: Secondary | ICD-10-CM | POA: Diagnosis present

## 2022-01-02 DIAGNOSIS — R2689 Other abnormalities of gait and mobility: Secondary | ICD-10-CM | POA: Diagnosis not present

## 2022-01-02 LAB — BASIC METABOLIC PANEL
Anion gap: 8 (ref 5–15)
BUN: 18 mg/dL (ref 8–23)
CO2: 24 mmol/L (ref 22–32)
Calcium: 8.6 mg/dL — ABNORMAL LOW (ref 8.9–10.3)
Chloride: 108 mmol/L (ref 98–111)
Creatinine, Ser: 1.01 mg/dL (ref 0.61–1.24)
GFR, Estimated: >60 mL/min (ref >60)
Glucose, Bld: 95 mg/dL (ref 70–99)
Potassium: 3.9 mmol/L (ref 3.5–5.1)
Sodium: 140 mmol/L (ref 135–145)

## 2022-01-02 LAB — LIPID PANEL
Cholesterol: 163 mg/dL (ref 0–200)
HDL: 39 mg/dL — ABNORMAL LOW (ref >40)
LDL Cholesterol: 112 mg/dL — ABNORMAL HIGH (ref 0–99)
Total CHOL/HDL Ratio: 4.2 RATIO
Triglycerides: 60 mg/dL (ref <150)
VLDL: 12 mg/dL (ref 0–40)

## 2022-01-02 MED ORDER — LAMOTRIGINE 25 MG PO TABS
ORAL_TABLET | ORAL | Status: DC
Start: 2022-01-02 — End: 2022-05-15

## 2022-01-02 MED ORDER — LORAZEPAM 0.5 MG PO TABS: 0.2500 mg | ORAL_TABLET | Freq: Every day | ORAL | 0 refills | Status: DC

## 2022-01-02 MED ORDER — QUETIAPINE FUMARATE 25 MG PO TABS: 25.0000 mg | ORAL_TABLET | Freq: Every day | ORAL | Status: DC

## 2022-01-02 MED ORDER — ACETAMINOPHEN 500 MG PO TABS: 1000.0000 mg | ORAL_TABLET | Freq: Two times a day (BID) | ORAL | 0 refills | Status: AC

## 2022-01-02 MED ORDER — DIVALPROEX SODIUM 125 MG PO CSDR: 500.0000 mg | DELAYED_RELEASE_CAPSULE | Freq: Every day | ORAL | Status: DC

## 2022-01-02 MED ORDER — LIDOCAINE 5 % EX PTCH: 1.0000 | MEDICATED_PATCH | CUTANEOUS | 0 refills | Status: DC

## 2022-01-02 MED ORDER — ROSUVASTATIN CALCIUM 5 MG PO TABS: 5.0000 mg | ORAL_TABLET | Freq: Every day | ORAL | Status: DC

## 2022-01-02 MED ORDER — METOPROLOL TARTRATE 25 MG PO TABS: 12.5000 mg | ORAL_TABLET | Freq: Two times a day (BID) | ORAL | Status: DC

## 2022-01-02 NOTE — TOC Transition Note (Signed)
Transition of Care (TOC) - CM/SW Discharge Note ? ? ?Patient Details  ?Name: Adam Swanson. ?MRN: [id number] ?Date of Birth: 21-Nov-1927 ? ?Transition of Care (TOC) CM/SW Contact:  ?Reece Agar, LCSWA ?Phone Number: ?01/02/2022, 9:39 AM ? ? ?Clinical Narrative:    ?Patient will DC to: [facility] ?Anticipated DC date: 01/02/2022 ?Family notified: Pt Son ?Transport by: Corey Harold ? ? ?Per MD patient ready for DC to [facility]. RN to call report prior to discharge [phone]). RN, patient, patient's family, and facility notified of DC. Discharge Summary and FL2 sent to facility. DC packet on chart. Ambulance transport requested for patient.  ? ?CSW will sign off for now as social work intervention is no longer needed. Please consult [place] again if new needs arise. ?  ? ? ?  ?  ? ? ?Patient Goals and CMS Choice ?  ?  ?  ? ?Discharge Placement ?  ?           ?  ?  ?  ?  ? ?Discharge Plan and Services ?  ?  ?           ?  ?  ?  ?  ?  ?  ?  ?  ?  ?  ? ?Social Determinants of Health (SDOH) Interventions ?  ? ? ?Readmission Risk Interventions ?   ? View : No data to display.  ?  ?  ?  ? ? ? ? ? ?

## 2022-01-02 NOTE — Progress Notes (Signed)
Physical Therapy Treatment ?Patient Details ?Name: Adam Swanson. ?MRN: [id number] ?DOB: December 10, 1927 ?Today's Date: 01/02/2022 ? ? ?History of Present Illness Adam Swanson. is a 88 y.o. male admitted 4/18 after an unwitnessed fall and questionable seizure activity. Admission complicated by agitation, confusion and progressive dysarthria. EEG negative for seizures. PMH: CKD, BPH, GERD, anxiety. ? ?  ?PT Comments  ? ? Patient progressing well towards PT goals. Reports not sleeping well last night and hurting. Session focused on progressive ambulation and functional mobility/transfers. Requires Mod A progressing to min A for standing and min A for gait training with chair follow and use of rollator. Pt does not like using RW due to fear of falling. VSS on RA with Sp02 >93%. Continues to be a fall risk. Will follow to address strength, mobility and balance. ?  ?Recommendations for follow up therapy are one component of a multi-disciplinary discharge planning process, led by the attending physician.  Recommendations may be updated based on patient status, additional functional criteria and insurance authorization. ? ?Follow Up Recommendations ? Skilled nursing-short term rehab (<3 hours/day) ?  ?  ?Assistance Recommended at Discharge Frequent or constant Supervision/Assistance  ?Patient can return home with the following Two people to help with walking and/or transfers;A lot of help with bathing/dressing/bathroom;Assistance with feeding;Direct supervision/assist for medications management;Direct supervision/assist for financial management;Assist for transportation;Help with stairs or ramp for entrance ?  ?Equipment Recommendations ? None recommended by PT  ?  ?Recommendations for Other Services   ? ? ?  ?Precautions / Restrictions Precautions ?Precautions: Fall;Other (comment) ?Precaution Comments: delirium ?Restrictions ?Weight Bearing Restrictions: No  ?  ? ?Mobility ? Bed Mobility ?  ?  ?  ?  ?  ?  ?  ?General  bed mobility comments: Up in chair upon PT arrival. ?  ? ?Transfers ?Overall transfer level: Needs assistance ?Equipment used: Rollator (4 wheels), Rolling walker (2 wheels) ?Transfers: Sit to/from Stand ?Sit to Stand: Min assist, Mod assist ?  ?  ?  ?  ?  ?General transfer comment: Mod A to power to standing progressing to Min A from chair x4 with cues for hand placement. Flexed at hips/trunk at baseline. ?  ? ?Ambulation/Gait ?Ambulation/Gait assistance: Min assist, +2 safety/equipment ?Gait Distance (Feet): 95 Feet ?Assistive device: Rollator (4 wheels) ?Gait Pattern/deviations: Step-through pattern, Decreased step length - right, Decreased step length - left, Decreased stride length, Shuffle ?Gait velocity: decreased ?  ?  ?General Gait Details: Slow, shuffling like gait with flexed trunk/hips with a few standing rest breaks, assist for balance and navigate rollator. ? ? ?Stairs ?  ?  ?  ?  ?  ? ? ?Wheelchair Mobility ?  ? ?Modified Rankin (Stroke Patients Only) ?  ? ? ?  ?Balance Overall balance assessment: Needs assistance ?Sitting-balance support: Feet supported, No upper extremity supported ?Sitting balance-Leahy Scale: Fair ?Sitting balance - Comments: Does better with back support ?  ?Standing balance support: During functional activity ?Standing balance-Leahy Scale: Poor ?Standing balance comment: BUE support + minA ?  ?  ?  ?  ?  ?  ?  ?  ?  ?  ?  ?  ? ?  ?Cognition Arousal/Alertness: Awake/alert ?Behavior During Therapy: Flat affect ?Overall Cognitive Status: No family/caregiver present to determine baseline cognitive functioning ?Area of Impairment: Memory, Safety/judgement ?  ?  ?  ?  ?  ?  ?  ?  ?  ?  ?Memory: Decreased short-term memory ?  ?Safety/Judgement: Decreased awareness of  safety, Decreased awareness of deficits ?  ?  ?General Comments: pt with hx of STM deficits, able to follow simple cues more consistently today but remains intermittently confused and needing redirection. Repeating self  worried about where his pants are. "I am leaving here today and worried about that." ?  ?  ? ?  ?Exercises   ? ?  ?General Comments General comments (skin integrity, edema, etc.): VSS on RA, Sp02 >93% on RA ?  ?  ? ?Pertinent Vitals/Pain Pain Assessment ?Pain Assessment: Faces ?Faces Pain Scale: Hurts little more ?Pain Location: back, left side ?Pain Descriptors / Indicators: Grimacing, Guarding, Moaning ?Pain Intervention(s): Monitored during session, Repositioned  ? ? ?Home Living   ?  ?  ?  ?  ?  ?  ?  ?  ?  ?   ?  ?Prior Function    ?  ?  ?   ? ?PT Goals (current goals can now be found in the care plan section) Progress towards PT goals: Progressing toward goals ? ?  ?Frequency ? ? ? Min 3X/week ? ? ? ?  ?PT Plan Current plan remains appropriate  ? ? ?Co-evaluation   ?  ?  ?  ?  ? ?  ?AM-PAC PT "6 Clicks" Mobility   ?Outcome Measure ? Help needed turning from your back to your side while in a flat bed without using bedrails?: A Little ?Help needed moving from lying on your back to sitting on the side of a flat bed without using bedrails?: A Little ?Help needed moving to and from a bed to a chair (including a wheelchair)?: A Lot ?Help needed standing up from a chair using your arms (e.g., wheelchair or bedside chair)?: A Lot ?Help needed to walk in hospital room?: A Little ?Help needed climbing 3-5 steps with a railing? : Total ?6 Click Score: 14 ? ?  ?End of Session Equipment Utilized During Treatment: Gait belt ?Activity Tolerance: Patient tolerated treatment well ?Patient left: in chair;with call bell/phone within reach;with chair alarm set ?Nurse Communication: Mobility status ?PT Visit Diagnosis: Other abnormalities of gait and mobility (R26.89);Unsteadiness on feet (R26.81);Muscle weakness (generalized) (M62.81);History of falling (Z91.81) ?  ? ? ?Time: 7829-5621 ?PT Time Calculation (min) (ACUTE ONLY): 24 min ? ?Charges:  $Gait Training: 23-37 mins          ?          ? ?Marisa Severin, PT, DPT ?Acute  Rehabilitation Services ?Secure chat preferred ?Office [phone] ? ? ? ? ? ?[place] ?01/02/2022, 11:10 AM ? ?

## 2022-01-02 NOTE — Care Management Important Message (Signed)
Important Message ? ?Patient Details  ?Name: Adam Swanson. ?MRN: [id number] ?Date of Birth: [birth year] ? ? ?Medicare Important Message Given:  Yes ? ? ? ? ?[name] ?01/02/2022, 8:16 AM ?

## 2022-01-02 NOTE — Progress Notes (Signed)
Patient discharged to SNF. IV removed. Reports called to facility [facility]). Discharge packet will be given to driver. ?

## 2022-01-02 NOTE — Plan of Care (Signed)
?  Problem: Education: ?Goal: Knowledge of General Education information will improve ?Description: Including pain rating scale, medication(s)/side effects and non-pharmacologic comfort measures ?Outcome: Adequate for Discharge ?  ?Problem: Health Behavior/Discharge Planning: ?Goal: Ability to manage health-related needs will improve ?Outcome: Adequate for Discharge ?  ?Problem: Clinical Measurements: ?Goal: Ability to maintain clinical measurements within normal limits will improve ?Outcome: Adequate for Discharge ?Goal: Will remain free from infection ?Outcome: Adequate for Discharge ?Goal: Diagnostic test results will improve ?Outcome: Adequate for Discharge ?Goal: Respiratory complications will improve ?Outcome: Adequate for Discharge ?Goal: Cardiovascular complication will be avoided ?Outcome: Adequate for Discharge ?  ?Problem: Activity: ?Goal: Risk for activity intolerance will decrease ?Outcome: Adequate for Discharge ?  ?Problem: Nutrition: ?Goal: Adequate nutrition will be maintained ?Outcome: Adequate for Discharge ?  ?Problem: Coping: ?Goal: Level of anxiety will decrease ?Outcome: Adequate for Discharge ?  ?Problem: Elimination: ?Goal: Will not experience complications related to bowel motility ?Outcome: Adequate for Discharge ?Goal: Will not experience complications related to urinary retention ?Outcome: Adequate for Discharge ?  ?Problem: Pain Managment: ?Goal: General experience of comfort will improve ?Outcome: Adequate for Discharge ?  ?Problem: Safety: ?Goal: Ability to remain free from injury will improve ?Outcome: Adequate for Discharge ?  ?Problem: Skin Integrity: ?Goal: Risk for impaired skin integrity will decrease ?Outcome: Adequate for Discharge ?  ?Problem: Education: ?Goal: Expressions of having a comfortable level of knowledge regarding the disease process will increase ?Outcome: Adequate for Discharge ?  ?Problem: Coping: ?Goal: Ability to adjust to condition or change in health will  improve ?Outcome: Adequate for Discharge ?Goal: Ability to identify appropriate support needs will improve ?Outcome: Adequate for Discharge ?  ?Problem: Health Behavior/Discharge Planning: ?Goal: Compliance with prescribed medication regimen will improve ?Outcome: Adequate for Discharge ?  ?Problem: Medication: ?Goal: Risk for medication side effects will decrease ?Outcome: Adequate for Discharge ?  ?Problem: Clinical Measurements: ?Goal: Complications related to the disease process, condition or treatment will be avoided or minimized ?Outcome: Adequate for Discharge ?Goal: Diagnostic test results will improve ?Outcome: Adequate for Discharge ?  ?Problem: Safety: ?Goal: Verbalization of understanding the information provided will improve ?Outcome: Adequate for Discharge ?  ?Problem: Self-Concept: ?Goal: Level of anxiety will decrease ?Outcome: Adequate for Discharge ?Goal: Ability to verbalize feelings about condition will improve ?Outcome: Adequate for Discharge ?  ?Problem: Safety: ?Goal: Non-violent Restraint(s) ?Outcome: Adequate for Discharge ?  ?

## 2022-01-02 NOTE — Discharge Summary (Signed)
?Physician Discharge Summary ?  ?Patient: Adam Swanson. MRN: [id number] DOB: 01-21-1928  ?Admit date:     12/26/2021  ?Discharge date: 01/02/22  ?Discharge Physician: Adam Swanson  ? ?PCP: Adam Fess, MD  ? ? ? ?Recommendations at discharge:  ?Follow up with [facility]rology in 4-6 weeks for seizure, continued taper of Lamictal, stroke, neurocognitive evaluation ?For the rib fractures: ?- Incentive spirometry three times daily ?- Acetaminophen 1000 mg twice daily for 5 days then stop ?- Lidocaine patch for 1 week, then re-evaluate ? ?3. For the seizures: ? - Give Depakote 500 mg nightly for 3 more days, last dose 4/27 ? - Taper up lamotrigine over 8 weeks as below ? ?4. For the delirium: ? - Taper lorazepam, give 0.25 mg nightly for 4 more days then stop ? - Avoid benzodiazepines ? - Given quetiapine/Seroquel 25 mg nightly  ? - Re-evaluate Seroquel use within 2 weeks and stop if able ? - PT evaluation ? ?5. For the atrial fibrillation: ? - Start Eliquis on Friday when Depakote is stopped ? ?6. For the aspiration risk: ? - Patient discharged here on pureed diet, this appeared to be largely due to delirium, not difficulty with swallowing, seems to be improving rapidly, please evaluate by Speech Therapy at [facility] as soon as able and upgrade diet when safe ? ? ? ? ? ? ?Discharge Diagnoses: ?Principal Problem: ?  Traumatic fracture of ribs with pneumothorax, left, closed, initial encounter ?Active Problems: ?  Pneumothorax, LEFT ?  Seizure ([place]) ?  Delirium ?  Atrial fibrillation with RVR ([place]) ?  Hyponatremia ?  Anxiety ?  Chronic kidney disease, stage 3a ([place]) ?  Anemia in chronic kidney disease (CKD) ?  Dementia   ?  Hypokalemia ?  Stroke [facility]) ? ? ? ? ?Hospital Course: ?Adam Swanson is a 88 y.o. M with CKD stage IIIa, chronic hyponatremia, GERD, BPH, anxiety who presented after a fall and seizure-like activity at his [facility]. ? ?Int he ER, found to have left 6th-9th rib fractures with large left  pneumothorax s/p chest tube placement.  Also with new onset A-fib with RVR.    ? ? ?  ? ? ? ?* Traumatic fracture of ribs with pneumothorax, left, closed, initial encounter ?Patient admitted and chest tube placed.  Trauma consulted.   ? ?Serial chest x-rays showed resolution of PTX, so chest tube removed.  Subsequent day saw small recurrence of PTX on imaging, but this was followed again with serial x-rays (without chest tube this time) supplemental O2 and IS and decreased in size.   ? ?Discussed with General Surgery who recommend no repeat chest tube.  If hypoxic, chest pain or dyspneic, repeat CXR. ? ?Otherwise, repeat CXR in 2 weeks to document resolution.   ? ? ? ? ? ?Seizure ([place]) ?Neurology consulted.  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  Neurology suspect this old hemorrhage led to cortical irritability, especially in the setting of Ativan use, and provoked the seizure. ? ?No further seizures in the hospital.  Started on Lamictal.  Continue on lamotrigine as the antiepileptic for him-titration per the schedule below: ?Lamotrigine Morning Evening  ?Week 1 - 4/24 - 4/30   '25mg'$   ?Week 2 - 5/1 - 5/7 '25mg'$  '25mg'$   ?Week 3 - 5/8 - 5/14 '50mg'$  '25mg'$   ?Week 4 - 5/15 - 5/21 '50mg'$  '50mg'$   ?Week 5 - 5/22 - 5/28 '75mg'$  '50mg'$   ?Week 6 - 5/29 - 6/4  $'75mg's$  '75mg'$   ?Week 7 - 6/5 - 6/11 '100mg'$  '75mg'$   ?Week 8 - 6/12 - 6/18 '100mg'$  '100mg'$   ?TAPER depakote with 500 QHS for 3 days and then stop  ? ?Follow up with Neurology in 4-6 weeks ? ? ? ? ?Atrial fibrillation with RVR ([place]) ?New onset.  Treated with IV Diltiazem in the ER and spontaneously converted to NSR. Echo with EF 50%, normal valves. TSH normal.  ?CHA2DS2-Vasc 6 (age, new stroke).   ? ?Start Eliquis when depakote discontinued ? ? ?Stroke [facility]) ?Small subcortical right precentral gyrus infarct. ?Discussed with Neurology, timing unclear but likely subacute, not symptomatic.  Echo done, and angiogram showed no focal disease, no stenosis in carotids.  Nonsmoker.  Dysphagia screening done. Lipids ordered, started on atorvastatin. ? ?Delirium ?Patient with persistent delirium in the setting of dementia, chest tube, new antiepileptics. ? ?Unfortunately, patient is prescribed Ativan at a high dose nightly for the last several months. ? ?Ativan tapered here.  Recommend taper off as above. ? ?Replaced with Seroquel, would recommend attempts to wean off this obviously as well.   Lamictal has mood stabilizing benefit, and will hopefully improve this. ? ?At this point, recommend mobilization, PT, return to home setting. ? ? ? ?  ? ? ? ? ? ?The [facility] Controlled Substances Registry was reviewed for this patient prior to discharge.  ? ?Consultants: neurology, Trauma service ?Procedures performed: CT head, MRI brain, CTA chest, chest tube placement, echo  ?Disposition: Skilled nursing facility ?Diet recommendation: Pureed diet with nectar thick liquids ? ? ?DISCHARGE MEDICATION: ?Allergies as of 01/02/2022   ? ?   Reactions  ? Bee Venom Anaphylaxis  ? Other reaction(s): Other (See Comments) ?Loss of consciousness  ? Nsaids   ? Other reaction(s): intol  ? Other Other (See Comments)  ? ?  ? ?  ?Medication List  ?  ? ?TAKE these medications   ? ?acetaminophen 500 MG tablet ?Commonly known as: TYLENOL ?Take 2 tablets (1,000 mg total) by mouth 2 (two) times daily for 5 days. ?  ?demeclocycline 150 MG tablet ?Commonly known as: DECLOMYCIN ?Take 1 tablet (150 mg total) by mouth 2 (two) times daily. ?What changed: additional instructions ?  ?divalproex 125 MG capsule ?Commonly known as: DEPAKOTE SPRINKLE ?Take 4 capsules (500 mg total) by mouth at bedtime for 2 days. ?  ?EPINEPHrine 0.3 mg/0.3 mL Soaj injection ?Commonly known as: EPI-PEN ?Inject 0.3 mg into the muscle as needed for anaphylaxis. ?  ?esomeprazole 20 MG capsule ?Commonly known as: [place] ?Take 20 mg by mouth daily. ?  ?famotidine 20 MG tablet ?Commonly known as: PEPCID ?Take 20 mg by mouth every evening. ?   ?furosemide 20 MG tablet ?Commonly known as: LASIX ?TAKE 2 TABLET = 40 MG BY MOUTH ONCE DAILY ?What changed: See the new instructions. ?  ?lamoTRIgine 25 MG tablet ?Commonly known as: LAMICTAL ?Take 25 mg nightly for 6 more days, then take 25 mg twice daily for 1 week, then take 50 mg in AM and 25 mg in PM for 1 week then continue taper up as outlined in discharge summary.  25 mg increase per week over 8 weeks with goal 100 BID ?  ?lidocaine 5 % ?Commonly known as: LIDODERM ?Place 1 patch onto the skin daily. Remove & Discard patch within 12 hours or as directed by MD ?Start taking on: January 03, 2022 ?  ?LORazepam 0.5 MG tablet ?Commonly known as: ATIVAN ?Take 0.5 tablets (0.25 mg total) by mouth at bedtime  for 4 days. ?What changed:  ?medication strength ?how much to take ?when to take this ?additional instructions ?  ?metoprolol tartrate 25 MG tablet ?Commonly known as: LOPRESSOR ?Take 0.5 tablets (12.5 mg total) by mouth 2 (two) times daily. ?  ?QUEtiapine 25 MG tablet ?Commonly known as: SEROQUEL ?Take 1 tablet (25 mg total) by mouth at bedtime. ?  ?rosuvastatin 5 MG tablet ?Commonly known as: Crestor ?Take 1 tablet (5 mg total) by mouth at bedtime. ?  ?sodium chloride 1 g tablet ?TAKE 4 TABLETS = 4 GM BY MOUTH ONCE DAILY ?What changed: See the new instructions. ?  ?tamsulosin 0.4 MG Caps capsule ?Commonly known as: FLOMAX ?Take 0.4 mg by mouth at bedtime. ?  ? ?  ? ? Follow-up Information   ? ? [facility] Follow up.   ?Specialty: Neurology ?Contact information: ?[address] ?[phone] ? ?  ?  ? ?  ?  ? ?  ? ? ?Discharge Instructions   ? ? Ambulatory referral to Neurology   Complete by: As directed ?  ? An appointment is requested in approximately: 4 weeks  ? Discharge instructions   Complete by: As directed ?  ? From Dr. Loleta Books: ?Your father was admitted for a combination of things.   ?He had a fall and subsequently was observed to have a  seizure. ?When he arrived to the hospital he had: ?1. Chest x-ray showing large left pneumothorax and acute left sixth through ninth rib fractures ?2. EKG showing atrial fibrillation ? ?He had a chest tube placed for th

## 2022-01-02 NOTE — Social Work (Signed)
RE: Adam Swanson ?Date of Birth: [birth year] ?Date: 01/02/2022 ? ?Please be advised that the above-named patient will require a short-term nursing home stay - anticipated 30 days or less for rehabilitation and strengthening.  The plan is for return home.  ?

## 2022-01-03 DIAGNOSIS — R627 Adult failure to thrive: Secondary | ICD-10-CM | POA: Diagnosis not present

## 2022-01-03 DIAGNOSIS — R296 Repeated falls: Secondary | ICD-10-CM | POA: Diagnosis not present

## 2022-01-03 DIAGNOSIS — M6281 Muscle weakness (generalized): Secondary | ICD-10-CM | POA: Diagnosis not present

## 2022-01-03 DIAGNOSIS — F0390 Unspecified dementia without behavioral disturbance: Secondary | ICD-10-CM | POA: Diagnosis not present

## 2022-01-05 DIAGNOSIS — W19XXXA Unspecified fall, initial encounter: Secondary | ICD-10-CM | POA: Diagnosis not present

## 2022-01-05 DIAGNOSIS — Z9181 History of falling: Secondary | ICD-10-CM | POA: Diagnosis not present

## 2022-01-05 DIAGNOSIS — R2689 Other abnormalities of gait and mobility: Secondary | ICD-10-CM | POA: Diagnosis not present

## 2022-01-05 DIAGNOSIS — M6281 Muscle weakness (generalized): Secondary | ICD-10-CM | POA: Diagnosis not present

## 2022-01-07 LAB — MUSK ANTIBODIES: MuSK Antibodies: <1 U/mL

## 2022-01-08 ENCOUNTER — Emergency Department (HOSPITAL_COMMUNITY): Payer: Medicare Other

## 2022-01-08 ENCOUNTER — Inpatient Hospital Stay (HOSPITAL_COMMUNITY)
Admission: EM | Admit: 2022-01-08 | Discharge: 2022-01-12 | DRG: 200 | Disposition: A | Discharge status: Skilled Nursing Facility | Payer: Medicare Other | Attending: Family Medicine | Admitting: Family Medicine

## 2022-01-08 ENCOUNTER — Observation Stay (HOSPITAL_COMMUNITY): Payer: Medicare Other

## 2022-01-08 DIAGNOSIS — J180 Bronchopneumonia, unspecified organism: Secondary | ICD-10-CM | POA: Diagnosis not present

## 2022-01-08 DIAGNOSIS — I517 Cardiomegaly: Secondary | ICD-10-CM | POA: Diagnosis not present

## 2022-01-08 DIAGNOSIS — Y838 Other surgical procedures as the cause of abnormal reaction of the patient, or of later complication, without mention of misadventure at the time of the procedure: Secondary | ICD-10-CM | POA: Diagnosis present

## 2022-01-08 DIAGNOSIS — Z886 Allergy status to analgesic agent status: Secondary | ICD-10-CM | POA: Diagnosis not present

## 2022-01-08 DIAGNOSIS — I4891 Unspecified atrial fibrillation: Secondary | ICD-10-CM | POA: Diagnosis present

## 2022-01-08 DIAGNOSIS — M150 Primary generalized (osteo)arthritis: Secondary | ICD-10-CM | POA: Diagnosis not present

## 2022-01-08 DIAGNOSIS — Z9181 History of falling: Secondary | ICD-10-CM | POA: Diagnosis not present

## 2022-01-08 DIAGNOSIS — J811 Chronic pulmonary edema: Secondary | ICD-10-CM | POA: Diagnosis not present

## 2022-01-08 DIAGNOSIS — Z79899 Other long term (current) drug therapy: Secondary | ICD-10-CM

## 2022-01-08 DIAGNOSIS — Z91030 Bee allergy status: Secondary | ICD-10-CM | POA: Diagnosis not present

## 2022-01-08 DIAGNOSIS — J984 Other disorders of lung: Secondary | ICD-10-CM | POA: Diagnosis not present

## 2022-01-08 DIAGNOSIS — I639 Cerebral infarction, unspecified: Secondary | ICD-10-CM | POA: Diagnosis not present

## 2022-01-08 DIAGNOSIS — R2681 Unsteadiness on feet: Secondary | ICD-10-CM | POA: Diagnosis not present

## 2022-01-08 DIAGNOSIS — N1831 Chronic kidney disease, stage 3a: Secondary | ICD-10-CM | POA: Diagnosis present

## 2022-01-08 DIAGNOSIS — R531 Weakness: Secondary | ICD-10-CM | POA: Diagnosis not present

## 2022-01-08 DIAGNOSIS — F03A11 Unspecified dementia, mild, with agitation: Secondary | ICD-10-CM | POA: Diagnosis present

## 2022-01-08 DIAGNOSIS — E871 Hypo-osmolality and hyponatremia: Secondary | ICD-10-CM | POA: Diagnosis not present

## 2022-01-08 DIAGNOSIS — I700 Atherosclerosis of aorta: Secondary | ICD-10-CM | POA: Diagnosis not present

## 2022-01-08 DIAGNOSIS — J189 Pneumonia, unspecified organism: Secondary | ICD-10-CM | POA: Diagnosis not present

## 2022-01-08 DIAGNOSIS — F0390 Unspecified dementia without behavioral disturbance: Secondary | ICD-10-CM | POA: Diagnosis not present

## 2022-01-08 DIAGNOSIS — Z4682 Encounter for fitting and adjustment of non-vascular catheter: Secondary | ICD-10-CM | POA: Diagnosis not present

## 2022-01-08 DIAGNOSIS — J939 Pneumothorax, unspecified: Principal | ICD-10-CM

## 2022-01-08 DIAGNOSIS — S270XXA Traumatic pneumothorax, initial encounter: Secondary | ICD-10-CM | POA: Diagnosis not present

## 2022-01-08 DIAGNOSIS — Z8639 Personal history of other endocrine, nutritional and metabolic disease: Secondary | ICD-10-CM | POA: Diagnosis not present

## 2022-01-08 DIAGNOSIS — R2689 Other abnormalities of gait and mobility: Secondary | ICD-10-CM | POA: Diagnosis not present

## 2022-01-08 DIAGNOSIS — S270XXD Traumatic pneumothorax, subsequent encounter: Secondary | ICD-10-CM | POA: Diagnosis not present

## 2022-01-08 DIAGNOSIS — K219 Gastro-esophageal reflux disease without esophagitis: Secondary | ICD-10-CM | POA: Diagnosis present

## 2022-01-08 DIAGNOSIS — R410 Disorientation, unspecified: Secondary | ICD-10-CM | POA: Diagnosis not present

## 2022-01-08 DIAGNOSIS — J159 Unspecified bacterial pneumonia: Secondary | ICD-10-CM | POA: Diagnosis not present

## 2022-01-08 DIAGNOSIS — R0602 Shortness of breath: Secondary | ICD-10-CM | POA: Diagnosis not present

## 2022-01-08 DIAGNOSIS — Z7401 Bed confinement status: Secondary | ICD-10-CM | POA: Diagnosis not present

## 2022-01-08 DIAGNOSIS — Z86010 Personal history of colonic polyps: Secondary | ICD-10-CM | POA: Diagnosis not present

## 2022-01-08 DIAGNOSIS — R296 Repeated falls: Secondary | ICD-10-CM | POA: Diagnosis not present

## 2022-01-08 DIAGNOSIS — S270XXS Traumatic pneumothorax, sequela: Secondary | ICD-10-CM | POA: Diagnosis not present

## 2022-01-08 DIAGNOSIS — J95811 Postprocedural pneumothorax: Principal | ICD-10-CM | POA: Diagnosis present

## 2022-01-08 DIAGNOSIS — W010XXA Fall on same level from slipping, tripping and stumbling without subsequent striking against object, initial encounter: Secondary | ICD-10-CM | POA: Diagnosis not present

## 2022-01-08 DIAGNOSIS — I480 Paroxysmal atrial fibrillation: Secondary | ICD-10-CM | POA: Diagnosis not present

## 2022-01-08 DIAGNOSIS — E222 Syndrome of inappropriate secretion of antidiuretic hormone: Secondary | ICD-10-CM | POA: Diagnosis not present

## 2022-01-08 DIAGNOSIS — M6281 Muscle weakness (generalized): Secondary | ICD-10-CM | POA: Diagnosis not present

## 2022-01-08 DIAGNOSIS — R4182 Altered mental status, unspecified: Secondary | ICD-10-CM | POA: Diagnosis not present

## 2022-01-08 DIAGNOSIS — S2242XS Multiple fractures of ribs, left side, sequela: Secondary | ICD-10-CM

## 2022-01-08 DIAGNOSIS — M4000 Postural kyphosis, site unspecified: Secondary | ICD-10-CM | POA: Diagnosis not present

## 2022-01-08 DIAGNOSIS — Z515 Encounter for palliative care: Secondary | ICD-10-CM | POA: Diagnosis not present

## 2022-01-08 DIAGNOSIS — M2550 Pain in unspecified joint: Secondary | ICD-10-CM | POA: Diagnosis not present

## 2022-01-08 DIAGNOSIS — R0600 Dyspnea, unspecified: Secondary | ICD-10-CM | POA: Diagnosis not present

## 2022-01-08 DIAGNOSIS — R569 Unspecified convulsions: Secondary | ICD-10-CM | POA: Diagnosis not present

## 2022-01-08 DIAGNOSIS — S2242XA Multiple fractures of ribs, left side, initial encounter for closed fracture: Secondary | ICD-10-CM | POA: Diagnosis not present

## 2022-01-08 DIAGNOSIS — J90 Pleural effusion, not elsewhere classified: Secondary | ICD-10-CM | POA: Diagnosis not present

## 2022-01-08 DIAGNOSIS — E785 Hyperlipidemia, unspecified: Secondary | ICD-10-CM | POA: Diagnosis not present

## 2022-01-08 DIAGNOSIS — R41841 Cognitive communication deficit: Secondary | ICD-10-CM | POA: Diagnosis not present

## 2022-01-08 DIAGNOSIS — J9811 Atelectasis: Secondary | ICD-10-CM | POA: Diagnosis not present

## 2022-01-08 LAB — BASIC METABOLIC PANEL
Anion gap: 8 (ref 5–15)
BUN: 14 mg/dL (ref 8–23)
CO2: 26 mmol/L (ref 22–32)
Calcium: 8.9 mg/dL (ref 8.9–10.3)
Chloride: 109 mmol/L (ref 98–111)
Creatinine, Ser: 1.19 mg/dL (ref 0.61–1.24)
GFR, Estimated: 57 mL/min — ABNORMAL LOW (ref >60)
Glucose, Bld: 117 mg/dL — ABNORMAL HIGH (ref 70–99)
Potassium: 3.7 mmol/L (ref 3.5–5.1)
Sodium: 143 mmol/L (ref 135–145)

## 2022-01-08 LAB — CBC WITH DIFFERENTIAL/PLATELET
Abs Immature Granulocytes: 0.04 10*3/uL (ref 0.00–0.07)
Basophils Absolute: 0 10*3/uL (ref 0.0–0.1)
Basophils Relative: 1 %
Eosinophils Absolute: 0.2 10*3/uL (ref 0.0–0.5)
Eosinophils Relative: 2 %
HCT: 37.6 % — ABNORMAL LOW (ref 39.0–52.0)
Hemoglobin: 12.4 g/dL — ABNORMAL LOW (ref 13.0–17.0)
Immature Granulocytes: 1 %
Lymphocytes Relative: 18 %
Lymphs Abs: 1.5 10*3/uL (ref 0.7–4.0)
MCH: 30.8 pg (ref 26.0–34.0)
MCHC: 33 g/dL (ref 30.0–36.0)
MCV: 93.5 fL (ref 80.0–100.0)
Monocytes Absolute: 1.3 10*3/uL — ABNORMAL HIGH (ref 0.1–1.0)
Monocytes Relative: 15 %
Neutro Abs: 5.4 10*3/uL (ref 1.7–7.7)
Neutrophils Relative %: 63 %
Platelets: 283 10*3/uL (ref 150–400)
RBC: 4.02 MIL/uL — ABNORMAL LOW (ref 4.22–5.81)
RDW: 17 % — ABNORMAL HIGH (ref 11.5–15.5)
WBC: 8.6 10*3/uL (ref 4.0–10.5)
nRBC: 0 % (ref 0.0–0.2)

## 2022-01-08 NOTE — ED Triage Notes (Signed)
Pt presents w/ a L pneumothorax and L pleural effusions on outside imaging s/p fall on 4/18. No complaints from pt. ?

## 2022-01-08 NOTE — ED Provider Notes (Signed)
?[facility] ?Provider Note ? ? ?CSN: [id number] ?Arrival date & time: 01/08/22  2134 ? ?  ? ?History ? ?Chief Complaint  ?Patient presents with  ? Chest Injury  ? ? ?Adam Swanson. is a 88 y.o. male. ? ?88 yo M with a chief complaints of having a chest x-ray performed today that was concerning for a hydropneumothorax.  The patient was recently in the hospital for a fall and suffered multiple broken ribs on the left side had a pneumothorax and had a chest tube placed.  This had improved and the tube was removed and the patient was discharged back to his facility.  He had a repeat x-ray that was done today that showed worsening was sent here for evaluation.  The patient denies any complaints currently.  He does tell me that is a bit sore on his left side after some further discussion with him. ? ? ? ?  ? ?Home Medications ?Prior to Admission medications   ?Medication Sig Start Date End Date Taking? Authorizing Provider  ?demeclocycline (DECLOMYCIN) 150 MG tablet Take 1 tablet (150 mg total) by mouth 2 (two) times daily. ?Patient taking differently: Take 150 mg by mouth 2 (two) times daily. Continuously 08/21/19   Renato Shin, MD  ?divalproex (DEPAKOTE SPRINKLE) 125 MG capsule Take 4 capsules (500 mg total) by mouth at bedtime for 2 days. 01/02/22 01/04/22  Edwin Dada, MD  ?EPINEPHrine 0.3 mg/0.3 mL IJ SOAJ injection Inject 0.3 mg into the muscle as needed for anaphylaxis. 11/03/21   [provider]  ?esomeprazole (NEXIUM) 20 MG capsule Take 20 mg by mouth daily.    [provider]  ?famotidine (PEPCID) 20 MG tablet Take 20 mg by mouth every evening.    [provider]  ?furosemide (LASIX) 20 MG tablet TAKE 2 TABLET = 40 MG BY MOUTH ONCE DAILY ?Patient taking differently: Take 40 mg by mouth daily. 05/17/21   Renato Shin, MD  ?lamoTRIgine (LAMICTAL) 25 MG tablet Take 25 mg nightly for 6 more days, then take 25 mg twice daily for 1 week,  then take 50 mg in AM and 25 mg in PM for 1 week then continue taper up as outlined in discharge summary.  25 mg increase per week over 8 weeks with goal 100 BID 01/02/22   Danford, Suann Larry, MD  ?lidocaine (LIDODERM) 5 % Place 1 patch onto the skin daily. Remove & Discard patch within 12 hours or as directed by MD 01/03/22   Danford, Suann Larry, MD  ?LORazepam (ATIVAN) 0.5 MG tablet Take 0.5 tablets (0.25 mg total) by mouth at bedtime for 4 days. 01/02/22 01/06/22  Danford, Suann Larry, MD  ?metoprolol tartrate (LOPRESSOR) 25 MG tablet Take 0.5 tablets (12.5 mg total) by mouth 2 (two) times daily. 01/02/22   Danford, Suann Larry, MD  ?QUEtiapine (SEROQUEL) 25 MG tablet Take 1 tablet (25 mg total) by mouth at bedtime. 01/02/22   Danford, Suann Larry, MD  ?rosuvastatin (CRESTOR) 5 MG tablet Take 1 tablet (5 mg total) by mouth at bedtime. 01/02/22 01/02/23  Danford, Suann Larry, MD  ?sodium chloride 1 g tablet TAKE 4 TABLETS = 4 GM BY MOUTH ONCE DAILY ?Patient taking differently: Take 2 g by mouth 2 (two) times daily with a meal. 02/20/21   Renato Shin, MD  ?tamsulosin (FLOMAX) 0.4 MG CAPS capsule Take 0.4 mg by mouth at bedtime.    [provider]  ?   ? ?Allergies    ?  Bee venom, Nsaids, and Other   ? ?Review of Systems   ?Review of Systems ? ?Physical Exam ?Updated Vital Signs ?BP 95/76   Pulse 70   Resp 17   SpO2 93%  ?Physical Exam ?Vitals and nursing note reviewed.  ?Constitutional:   ?   Appearance: He is well-developed.  ?HENT:  ?   Head: Normocephalic and atraumatic.  ?Eyes:  ?   Pupils: Pupils are equal, round, and reactive to light.  ?Neck:  ?   Vascular: No JVD.  ?Cardiovascular:  ?   Rate and Rhythm: Normal rate and regular rhythm.  ?   Heart sounds: No murmur heard. ?  No friction rub. No gallop.  ?Pulmonary:  ?   Effort: No respiratory distress.  ?   Breath sounds: No wheezing.  ?   Comments: Diminished breath sounds on the left.  Bruising to the left chest wall. ?Abdominal:  ?    General: There is no distension.  ?   Tenderness: There is no abdominal tenderness. There is no guarding or rebound.  ?Musculoskeletal:     ?   General: Normal range of motion.  ?   Cervical back: Normal range of motion and neck supple.  ?Skin: ?   Coloration: Skin is not pale.  ?   Findings: No rash.  ?Neurological:  ?   Mental Status: He is alert and oriented to person, place, and time.  ?Psychiatric:     ?   Behavior: Behavior normal.  ? ? ?ED Results / Procedures / Treatments   ?Labs ?(all labs ordered are listed, but only abnormal results are displayed) ?Labs Reviewed  ?CBC WITH DIFFERENTIAL/PLATELET - Abnormal; Notable for the following components:  ?    Result Value  ? RBC 4.02 (*)   ? Hemoglobin 12.4 (*)   ? HCT 37.6 (*)   ? RDW 17.0 (*)   ? Monocytes Absolute 1.3 (*)   ? All other components within normal limits  ?BASIC METABOLIC PANEL - Abnormal; Notable for the following components:  ? Glucose, Bld 117 (*)   ? GFR, Estimated 57 (*)   ? All other components within normal limits  ? ? ?EKG ?EKG Interpretation ? ?Date/Time:  Monday Jan 08 2022 21:52:19 EDT ?Ventricular Rate:  72 ?PR Interval:  147 ?QRS Duration: 117 ?QT Interval:  406 ?QTC Calculation: 445 ?R Axis:   -33 ?Text Interpretation: Sinus rhythm Nonspecific intraventricular conduction delay Probable anteroseptal infarct, old No significant change since last tracing Confirmed by Deno Etienne [phone]) on 01/08/2022 10:02:01 PM ? ?Radiology ?DG Chest Port 1 View ? ?Result Date: 01/08/2022 ?CLINICAL DATA:  Post fall with pneumothorax EXAM: PORTABLE CHEST 1 VIEW COMPARISON:  01/01/2022, 12/31/2021 FINDINGS: Increased left-sided pneumothorax now moderate to large in size, estimated at 50%. No midline shift. Left pleural effusion. Increased atelectasis and or airspace disease of the left lung. Enlarged cardiomediastinal silhouette. Multiple left rib fractures IMPRESSION: 1. Interim enlargement of left pneumothorax now moderate to large in size estimated at 50%.  No midline shift. 2. Pleural effusion and airspace disease in the left lung. 3. Cardiomegaly Critical Value/emergent results were called by telephone at the time of interpretation on 01/08/2022 at 10:36 pm to provider Chijioke Lasser , who verbally acknowledged these results. Electronically Signed   By: Donavan Foil M.D.   On: 01/08/2022 22:37   ? ?Procedures ?Procedures  ? ? ?Medications Ordered in ED ?Medications - No data to display ? ?ED Course/ Medical Decision Making/ A&P ?  ?                        ?  Medical Decision Making ?Amount and/or Complexity of Data Reviewed ?Labs: ordered. ?Radiology: ordered. ? ? ?88 yo M with a chief complaints of a left-sided pneumothorax.  The patient was recently seen in the hospital after a fall and suffered a left-sided pneumothorax.  Chest discomfort this had resolved after chest tube placement and he was discharged home.  The patient unfortunately appears to have had recurrence based on the documentation that he came with.  Chest x-ray here independently interpreted by me with recurrent pneumothorax.  Will discuss with trauma. ?I discussed case with Dr. Windle Guard, she will place the chest tube at bedside.  Requesting medical admission.  Will discuss with hospitalist. ? ?No significant anemia no significant electrolyte abnormality. ? ?The patients results and plan were reviewed and discussed.   ?Any x-rays performed were independently reviewed by myself.  ? ?Differential diagnosis were considered with the presenting HPI. ? ?Medications - No data to display ? ?Vitals:  ? 01/08/22 2200  ?BP: 95/76  ?Pulse: 70  ?Resp: 17  ?SpO2: 93%  ? ? ?Final diagnoses:  ?Pneumothorax, left  ? ? ?Admission/ observation were discussed with the admitting physician, patient and/or family and they are comfortable with the plan.  ? ? ? ? ? ? ? ?Final Clinical Impression(s) / ED Diagnoses ?Final diagnoses:  ?Pneumothorax, left  ? ? ?Rx / DC Orders ?ED Discharge Orders   ? ? None  ? ?  ? ? ?  ?Deno Etienne,  DO ?01/08/22 2250 ? ?

## 2022-01-08 NOTE — Procedures (Signed)
Chest Tube Insertion Procedure Note ? ?Indications:  Clinically significant Recurrent Pneumothorax ? ?Pre-operative Diagnosis: Pneumothorax ? ?Post-operative Diagnosis: Pneumothorax ? ?Procedure Details  ?Informed consent was obtained for the procedure.  Risks of lung perforation, hemorrhage, arrhythmia, and adverse drug reaction were discussed.  ? ?After sterile skin prep, using seldinger technique, a 14 French tube was placed in the left anterolateral 4th rib space.  ? ?Findings: ?Positive rush of air, positive air leak when connected to pleur-vac ? ?Estimated Blood Loss:  Minimal ?        ?Specimens:  None ?             ?Complications:  None; patient tolerated the procedure well. ?        ?Disposition:  Patient to be admitted for ongoing care ?        ?Condition: stable ? ?Attending Attestation: I performed the procedure. ?

## 2022-01-09 ENCOUNTER — Observation Stay (HOSPITAL_COMMUNITY): Payer: Medicare Other

## 2022-01-09 ENCOUNTER — Other Ambulatory Visit: Payer: Self-pay

## 2022-01-09 DIAGNOSIS — K219 Gastro-esophageal reflux disease without esophagitis: Secondary | ICD-10-CM | POA: Diagnosis not present

## 2022-01-09 DIAGNOSIS — Y838 Other surgical procedures as the cause of abnormal reaction of the patient, or of later complication, without mention of misadventure at the time of the procedure: Secondary | ICD-10-CM | POA: Diagnosis present

## 2022-01-09 DIAGNOSIS — Z515 Encounter for palliative care: Secondary | ICD-10-CM | POA: Diagnosis not present

## 2022-01-09 DIAGNOSIS — M6281 Muscle weakness (generalized): Secondary | ICD-10-CM | POA: Diagnosis not present

## 2022-01-09 DIAGNOSIS — S2242XA Multiple fractures of ribs, left side, initial encounter for closed fracture: Secondary | ICD-10-CM | POA: Diagnosis not present

## 2022-01-09 DIAGNOSIS — R41841 Cognitive communication deficit: Secondary | ICD-10-CM | POA: Diagnosis not present

## 2022-01-09 DIAGNOSIS — J939 Pneumothorax, unspecified: Secondary | ICD-10-CM | POA: Diagnosis not present

## 2022-01-09 DIAGNOSIS — R296 Repeated falls: Secondary | ICD-10-CM | POA: Diagnosis not present

## 2022-01-09 DIAGNOSIS — Z9181 History of falling: Secondary | ICD-10-CM | POA: Diagnosis not present

## 2022-01-09 DIAGNOSIS — I700 Atherosclerosis of aorta: Secondary | ICD-10-CM | POA: Diagnosis not present

## 2022-01-09 DIAGNOSIS — J180 Bronchopneumonia, unspecified organism: Secondary | ICD-10-CM | POA: Diagnosis not present

## 2022-01-09 DIAGNOSIS — F0390 Unspecified dementia without behavioral disturbance: Secondary | ICD-10-CM | POA: Diagnosis not present

## 2022-01-09 DIAGNOSIS — Z91030 Bee allergy status: Secondary | ICD-10-CM | POA: Diagnosis not present

## 2022-01-09 DIAGNOSIS — M2550 Pain in unspecified joint: Secondary | ICD-10-CM | POA: Diagnosis not present

## 2022-01-09 DIAGNOSIS — N1831 Chronic kidney disease, stage 3a: Secondary | ICD-10-CM | POA: Diagnosis not present

## 2022-01-09 DIAGNOSIS — I480 Paroxysmal atrial fibrillation: Secondary | ICD-10-CM | POA: Diagnosis not present

## 2022-01-09 DIAGNOSIS — S270XXS Traumatic pneumothorax, sequela: Secondary | ICD-10-CM

## 2022-01-09 DIAGNOSIS — J189 Pneumonia, unspecified organism: Secondary | ICD-10-CM | POA: Diagnosis not present

## 2022-01-09 DIAGNOSIS — R2689 Other abnormalities of gait and mobility: Secondary | ICD-10-CM | POA: Diagnosis not present

## 2022-01-09 DIAGNOSIS — S2242XS Multiple fractures of ribs, left side, sequela: Secondary | ICD-10-CM

## 2022-01-09 DIAGNOSIS — R4182 Altered mental status, unspecified: Secondary | ICD-10-CM | POA: Diagnosis not present

## 2022-01-09 DIAGNOSIS — E222 Syndrome of inappropriate secretion of antidiuretic hormone: Secondary | ICD-10-CM | POA: Diagnosis not present

## 2022-01-09 DIAGNOSIS — R2681 Unsteadiness on feet: Secondary | ICD-10-CM | POA: Diagnosis not present

## 2022-01-09 DIAGNOSIS — S270XXA Traumatic pneumothorax, initial encounter: Secondary | ICD-10-CM | POA: Diagnosis not present

## 2022-01-09 DIAGNOSIS — R569 Unspecified convulsions: Secondary | ICD-10-CM | POA: Diagnosis not present

## 2022-01-09 DIAGNOSIS — R410 Disorientation, unspecified: Secondary | ICD-10-CM | POA: Diagnosis not present

## 2022-01-09 DIAGNOSIS — Z7401 Bed confinement status: Secondary | ICD-10-CM | POA: Diagnosis not present

## 2022-01-09 DIAGNOSIS — J90 Pleural effusion, not elsewhere classified: Secondary | ICD-10-CM | POA: Diagnosis not present

## 2022-01-09 DIAGNOSIS — S270XXD Traumatic pneumothorax, subsequent encounter: Secondary | ICD-10-CM | POA: Diagnosis not present

## 2022-01-09 DIAGNOSIS — Z8639 Personal history of other endocrine, nutritional and metabolic disease: Secondary | ICD-10-CM

## 2022-01-09 DIAGNOSIS — F03A11 Unspecified dementia, mild, with agitation: Secondary | ICD-10-CM | POA: Diagnosis not present

## 2022-01-09 DIAGNOSIS — E871 Hypo-osmolality and hyponatremia: Secondary | ICD-10-CM | POA: Diagnosis not present

## 2022-01-09 DIAGNOSIS — W010XXA Fall on same level from slipping, tripping and stumbling without subsequent striking against object, initial encounter: Secondary | ICD-10-CM | POA: Diagnosis not present

## 2022-01-09 DIAGNOSIS — I639 Cerebral infarction, unspecified: Secondary | ICD-10-CM | POA: Diagnosis not present

## 2022-01-09 DIAGNOSIS — J95811 Postprocedural pneumothorax: Secondary | ICD-10-CM

## 2022-01-09 DIAGNOSIS — Z4682 Encounter for fitting and adjustment of non-vascular catheter: Secondary | ICD-10-CM | POA: Diagnosis not present

## 2022-01-09 DIAGNOSIS — J159 Unspecified bacterial pneumonia: Secondary | ICD-10-CM | POA: Diagnosis not present

## 2022-01-09 DIAGNOSIS — M4000 Postural kyphosis, site unspecified: Secondary | ICD-10-CM | POA: Diagnosis not present

## 2022-01-09 DIAGNOSIS — I517 Cardiomegaly: Secondary | ICD-10-CM | POA: Diagnosis not present

## 2022-01-09 DIAGNOSIS — R531 Weakness: Secondary | ICD-10-CM | POA: Diagnosis not present

## 2022-01-09 DIAGNOSIS — Z886 Allergy status to analgesic agent status: Secondary | ICD-10-CM | POA: Diagnosis not present

## 2022-01-09 DIAGNOSIS — Z86010 Personal history of colonic polyps: Secondary | ICD-10-CM | POA: Diagnosis not present

## 2022-01-09 DIAGNOSIS — I4891 Unspecified atrial fibrillation: Secondary | ICD-10-CM | POA: Diagnosis not present

## 2022-01-09 DIAGNOSIS — M150 Primary generalized (osteo)arthritis: Secondary | ICD-10-CM | POA: Diagnosis not present

## 2022-01-09 DIAGNOSIS — Z79899 Other long term (current) drug therapy: Secondary | ICD-10-CM | POA: Diagnosis not present

## 2022-01-09 DIAGNOSIS — E785 Hyperlipidemia, unspecified: Secondary | ICD-10-CM | POA: Diagnosis not present

## 2022-01-09 LAB — BASIC METABOLIC PANEL
Anion gap: 8 (ref 5–15)
BUN: 13 mg/dL (ref 8–23)
CO2: 27 mmol/L (ref 22–32)
Calcium: 8.8 mg/dL — ABNORMAL LOW (ref 8.9–10.3)
Chloride: 107 mmol/L (ref 98–111)
Creatinine, Ser: 1.17 mg/dL (ref 0.61–1.24)
GFR, Estimated: 58 mL/min — ABNORMAL LOW (ref >60)
Glucose, Bld: 98 mg/dL (ref 70–99)
Potassium: 3.3 mmol/L — ABNORMAL LOW (ref 3.5–5.1)
Sodium: 142 mmol/L (ref 135–145)

## 2022-01-09 LAB — CBC
HCT: 39.4 % (ref 39.0–52.0)
Hemoglobin: 12.5 g/dL — ABNORMAL LOW (ref 13.0–17.0)
MCH: 29.8 pg (ref 26.0–34.0)
MCHC: 31.7 g/dL (ref 30.0–36.0)
MCV: 94 fL (ref 80.0–100.0)
Platelets: 290 10*3/uL (ref 150–400)
RBC: 4.19 MIL/uL — ABNORMAL LOW (ref 4.22–5.81)
RDW: 17.1 % — ABNORMAL HIGH (ref 11.5–15.5)
WBC: 9.8 10*3/uL (ref 4.0–10.5)
nRBC: 0 % (ref 0.0–0.2)

## 2022-01-09 MED ORDER — ONDANSETRON HCL 4 MG PO TABS: 4.0000 mg | ORAL_TABLET | Freq: Four times a day (QID) | ORAL | Status: DC | PRN

## 2022-01-09 MED ORDER — ONDANSETRON HCL 4 MG/2ML IJ SOLN
4.0000 mg | Freq: Four times a day (QID) | INTRAMUSCULAR | Status: DC | PRN
  Administered 2022-01-10: 4 mg via INTRAVENOUS
  Filled 2022-01-09: qty 2

## 2022-01-09 MED ORDER — DEMECLOCYCLINE HCL 150 MG PO TABS
150.0000 mg | ORAL_TABLET | Freq: Two times a day (BID) | ORAL | Status: DC
  Administered 2022-01-09 – 2022-01-12 (×6): 150 mg via ORAL
  Filled 2022-01-09 (×10): qty 1

## 2022-01-09 MED ORDER — METOPROLOL TARTRATE 12.5 MG HALF TABLET
12.5000 mg | ORAL_TABLET | Freq: Two times a day (BID) | ORAL | Status: DC
  Administered 2022-01-09 (×2): 12.5 mg via ORAL
  Filled 2022-01-09 (×3): qty 1

## 2022-01-09 MED ORDER — TAMSULOSIN HCL 0.4 MG PO CAPS
0.4000 mg | ORAL_CAPSULE | Freq: Every day | ORAL | Status: DC
  Administered 2022-01-09 – 2022-01-11 (×3): 0.4 mg via ORAL
  Filled 2022-01-09 (×3): qty 1

## 2022-01-09 MED ORDER — QUETIAPINE FUMARATE 25 MG PO TABS
25.0000 mg | ORAL_TABLET | Freq: Every day | ORAL | Status: DC
  Administered 2022-01-09 – 2022-01-11 (×3): 25 mg via ORAL
  Filled 2022-01-09 (×3): qty 1

## 2022-01-09 MED ORDER — HYDROCODONE-ACETAMINOPHEN 5-325 MG PO TABS: 1.0000 | ORAL_TABLET | Freq: Four times a day (QID) | ORAL | Status: DC | PRN

## 2022-01-09 MED ORDER — SODIUM CHLORIDE 1 G PO TABS
4.0000 g | ORAL_TABLET | Freq: Every day | ORAL | Status: DC
  Administered 2022-01-09 – 2022-01-12 (×4): 4 g via ORAL
  Filled 2022-01-09 (×4): qty 4

## 2022-01-09 MED ORDER — PANTOPRAZOLE SODIUM 40 MG PO TBEC
40.0000 mg | DELAYED_RELEASE_TABLET | Freq: Every day | ORAL | Status: DC
  Administered 2022-01-09 – 2022-01-12 (×4): 40 mg via ORAL
  Filled 2022-01-09 (×4): qty 1

## 2022-01-09 MED ORDER — FUROSEMIDE 40 MG PO TABS
40.0000 mg | ORAL_TABLET | Freq: Every day | ORAL | Status: DC
  Administered 2022-01-09 – 2022-01-10 (×2): 40 mg via ORAL
  Filled 2022-01-09: qty 2
  Filled 2022-01-09: qty 1

## 2022-01-09 MED ORDER — FAMOTIDINE 20 MG PO TABS
20.0000 mg | ORAL_TABLET | Freq: Every evening | ORAL | Status: DC
  Administered 2022-01-09 – 2022-01-10 (×2): 20 mg via ORAL
  Filled 2022-01-09 (×2): qty 1

## 2022-01-09 MED ORDER — LAMOTRIGINE 25 MG PO TABS
25.0000 mg | ORAL_TABLET | Freq: Every day | ORAL | Status: DC
  Administered 2022-01-09 – 2022-01-11 (×3): 25 mg via ORAL
  Filled 2022-01-09 (×4): qty 1

## 2022-01-09 MED ORDER — ROSUVASTATIN CALCIUM 5 MG PO TABS
5.0000 mg | ORAL_TABLET | Freq: Every day | ORAL | Status: DC
  Administered 2022-01-09 – 2022-01-11 (×3): 5 mg via ORAL
  Filled 2022-01-09 (×3): qty 1

## 2022-01-09 MED ORDER — ENOXAPARIN SODIUM 40 MG/0.4ML IJ SOSY
40.0000 mg | PREFILLED_SYRINGE | INTRAMUSCULAR | Status: DC
  Administered 2022-01-09 – 2022-01-12 (×4): 40 mg via SUBCUTANEOUS
  Filled 2022-01-09 (×4): qty 0.4

## 2022-01-09 MED ORDER — ACETAMINOPHEN 325 MG PO TABS: 650.0000 mg | ORAL_TABLET | Freq: Four times a day (QID) | ORAL | Status: DC | PRN

## 2022-01-09 MED ORDER — POTASSIUM CHLORIDE 20 MEQ PO PACK
40.0000 meq | PACK | Freq: Once | ORAL | Status: AC
  Administered 2022-01-09: 40 meq via ORAL
  Filled 2022-01-09: qty 2

## 2022-01-09 MED ORDER — LORAZEPAM 0.5 MG PO TABS: 0.2500 mg | ORAL_TABLET | ORAL | Status: DC

## 2022-01-09 NOTE — Assessment & Plan Note (Signed)
Continue IS ?

## 2022-01-09 NOTE — Assessment & Plan Note (Addendum)
Looks like he was supposed to start eliquis on Friday after depakote stopped according to DC summary, but doesn't look like this was done. ?On the other hand, not clear that [facility] is a great idea now that he has rib fractures, recurrent pneumothorax, with effusion, recent trauma secondary to fall with seizure like activity. ?1.  Hold-off on starting eliquis, (due to deliriums, falls, rib fractures, pneumothorax, seizures) ?2. Discussed with patient signs regarding contraindications of anticoagulation, rate control for now..  And his risk for stroke and bleeds. ?3. Recommending further discussion in a later date with PCP once patient stable regarding chronic anticoagulation therapy ?4. Cont BID metoprolol ?5. Tele monitor - had A.Fib last admit. ?

## 2022-01-09 NOTE — Progress Notes (Signed)
? ?Progress Note ? ?   ?Subjective: ?88 year old male with medical history significant for dementia who was recently admitted to the hospital 4/18-4/25 after a fall at nursing facility which resulted in rib fractures with a pneumothorax.  He also had seizure-like activity and was admitted to hospitalist service.  Last admission his chest tube was ultimately removed and he was discharged back to skilled nursing.  Follow-up x-ray at skilled nursing facility showed recurrent PTX and pleural effusion.  He was sent back to the ED for further evaluation. ? ?Patient states he was not having any pulmonary symptoms prior to chest x-ray. He denies cough, shortness of breath, rib pain recently.  He does have some chest wall pain currently due to recent chest tube placement. ? ?Patient oriented to self, place, and situation. ? ?Objective: ?Vital signs in last 24 hours: ?Temp:  [97.6 ?F (36.4 ?C)-98.2 ?F (36.8 ?C)] 97.6 ?F (36.4 ?C) (05/02 0740) ?Pulse Rate:  [63-83] 74 (05/02 0900) ?Resp:  [15-24] 21 (05/02 0900) ?BP: (95-143)/(68-95) 118/88 (05/02 0900) ?SpO2:  [90 %-97 %] 96 % (05/02 0900) ?  ? ?Intake/Output from previous day: ?No intake/output data recorded. ?Intake/Output this shift: ?No intake/output data recorded. ? ?PE: ?General: pleasant, WD, male who is laying in bed in NAD ?HEENT: head is normocephalic, atraumatic.  Sclera are noninjected.  Pupils equal and round. EOMs intact.  Ears and nose without any masses or lesions.  Mouth is pink and moist ?Heart: regular, rate, and rhythm.  Normal s1,s2. No obvious murmurs, gallops, or rubs noted.  Palpable radial and pedal pulses bilaterally ?Lungs: CTAB, no wheezes, rhonchi, or rales noted.  Respiratory effort nonlabored on room air. CT in place with bandage c/d/I. To suction. No air leak. 40 ml SS fluid in cannister ?Abd: soft, NT, ND, +BS ?MSK: all 4 extremities are symmetrical with no cyanosis, clubbing, or edema. ?Skin: warm and dry with no masses, lesions, or  rashes ?Neuro: Cranial nerves 2-12 grossly intact, sensation is normal throughout ?Psych: A&Ox3 with an appropriate affect.  ? ? ?Lab Results:  ?Recent Labs  ?  01/08/22 ?2202 01/09/22 ?0503  ?WBC 8.6 9.8  ?HGB 12.4* 12.5*  ?HCT 37.6* 39.4  ?PLT 283 290  ? ?BMET ?Recent Labs  ?  01/08/22 ?2202 01/09/22 ?0503  ?NA 143 142  ?K 3.7 3.3*  ?CL 109 107  ?CO2 26 27  ?GLUCOSE 117* 98  ?BUN 14 13  ?CREATININE 1.19 1.17  ?CALCIUM 8.9 8.8*  ? ?PT/INR ?No results for input(s): LABPROT, INR in the last 72 hours. ?CMP  ?   ?Component Value Date/Time  ? NA 142 01/09/2022 0503  ? K 3.3 (L) 01/09/2022 0503  ? CL 107 01/09/2022 0503  ? CO2 27 01/09/2022 0503  ? GLUCOSE 98 01/09/2022 0503  ? BUN 13 01/09/2022 0503  ? CREATININE 1.17 01/09/2022 0503  ? CALCIUM 8.8 (L) 01/09/2022 0503  ? PROT 7.2 12/14/2021 1443  ? ALBUMIN 4.0 12/14/2021 1443  ? AST 20 12/14/2021 1443  ? ALT 14 12/14/2021 1443  ? ALKPHOS 60 12/14/2021 1443  ? BILITOT 0.7 12/14/2021 1443  ? GFRNONAA 58 (L) 01/09/2022 0503  ? GFRAA >60 11/06/2015 0933  ? ?Lipase  ?   ?Component Value Date/Time  ? LIPASE 36 12/14/2021 1443  ? ? ? ? ? ?Studies/Results: ?DG Chest Port 1 View ? ?Result Date: 01/09/2022 ?CLINICAL DATA:  Encounter for chest tube EXAM: PORTABLE CHEST 1 VIEW COMPARISON:  01/08/2022 FINDINGS: Left chest tube with retention loop at the apex.  No visible pneumothorax. Decrease in left pleural effusion. Left lower lobe aeration is also improved. Stable cardiac enlargement. IMPRESSION: 1. Stable left chest tube positioning.  No visible pneumothorax. 2. Improved left lower lobe aeration and decreased left pleural effusion. Electronically Signed   By: Jorje Guild M.D.   On: 01/09/2022 06:11  ? ?DG Chest Portable 1 View ? ?Result Date: 01/08/2022 ?CLINICAL DATA:  Chest tube placement EXAM: PORTABLE CHEST 1 VIEW COMPARISON:  01/08/2022 FINDINGS: A pigtail left chest tube has been placed. Tip is at the left lung apex. Left lung has re-expanded with mild pulmonary edema.  Small left pleural effusion. No residual pneumothorax. Right lung is clear. IMPRESSION: Placement of pigtail left chest tube with re-expansion of the left lung. Mild re-expansion pulmonary edema and small left pleural effusion. Electronically Signed   By: Ulyses Jarred M.D.   On: 01/08/2022 23:44  ? ?DG Chest Port 1 View ? ?Result Date: 01/08/2022 ?CLINICAL DATA:  Post fall with pneumothorax EXAM: PORTABLE CHEST 1 VIEW COMPARISON:  01/01/2022, 12/31/2021 FINDINGS: Increased left-sided pneumothorax now moderate to large in size, estimated at 50%. No midline shift. Left pleural effusion. Increased atelectasis and or airspace disease of the left lung. Enlarged cardiomediastinal silhouette. Multiple left rib fractures IMPRESSION: 1. Interim enlargement of left pneumothorax now moderate to large in size estimated at 50%. No midline shift. 2. Pleural effusion and airspace disease in the left lung. 3. Cardiomegaly Critical Value/emergent results were called by telephone at the time of interpretation on 01/08/2022 at 10:36 pm to provider DAN FLOYD , who verbally acknowledged these results. Electronically Signed   By: Donavan Foil M.D.   On: 01/08/2022 22:37   ? ?Anti-infectives: ?Anti-infectives (From admission, onward)  ? ? Start     Dose/Rate Route Frequency Ordered Stop  ? 01/09/22 0330  demeclocycline (DECLOMYCIN) tablet 150 mg       ?Note to Pharmacy: Patient taking differently: Continuously    ? 150 mg Oral 2 times daily 01/09/22 0329    ? ?  ? ? ? ?Assessment/Plan ?GLF at SNF ?L 6-9 rib fx with L PTX - recurrent PTX and effusion on xray 5/1 s/p CT placement in ED.  ?Continue CT to suction today and follow serial chest xrays. Consider clamping trial prior to removal. Recommend aggressive pulmonary toilet and IS ?New onset seizures - diagnosed last admission and neuro consulted, MR brain showed possible stroke. On lamictal ?A. Fib with RVR - per [facility]. Prev on eliquis. Now held ?CKD stage IIIa - per [facility], stable ?Delirium -  per [facility], prn haldol  ?  ?FEN: HH ?VTE: SCDs, SQH ?ID: no current abx ? ?I reviewed ED provider notes, hospitalist notes, last 24 h vitals and pain scores, last 48 h intake and output, last 24 h labs and trends, and last 24 h imaging results. ? ? LOS: 0 days  ? ?Winferd Humphrey, PA-C ?[facility] ?01/09/2022, 9:43 AM ?Please see Amion for pager number during day hours 7:00am-4:30pm ? ?

## 2022-01-09 NOTE — Assessment & Plan Note (Addendum)
Cont home meds: seroquel. ?Ativan recently tapered off and stopped due to delirium. ?

## 2022-01-09 NOTE — Assessment & Plan Note (Signed)
On Lamictal titrating up slowly. ?

## 2022-01-09 NOTE — Progress Notes (Signed)
Trauma Event Note ? ? ? ?Called to bedside by Dr. Kae Heller to assist with chest tube placement. Portable XRAY with left pneumothorax, hs of same in April after a fall - I assisted with his initial chest tube placement. Pigtail/Cook tube placed by Dr. Kae Heller, placed to [facility] on wall suction. Tube secured with sutures, Dr. Kae Heller placed dressing. Hospitalist to admit, trauma to follow for CT. ? ?Last imported Vital Signs ?BP 129/68   Pulse 63   Temp 98.2 ?F (36.8 ?C) (Oral)   Resp 17   SpO2 97%  ? ?Trending CBC ?Recent Labs  ?  01/08/22 ?2202  ?WBC 8.6  ?HGB 12.4*  ?HCT 37.6*  ?PLT 283  ? ? ?Trending Coag's ?No results for input(s): APTT, INR in the last 72 hours. ? ?Trending BMET ?Recent Labs  ?  01/08/22 ?2202  ?NA 143  ?K 3.7  ?CL 109  ?CO2 26  ?BUN 14  ?CREATININE 1.19  ?GLUCOSE 117*  ? ? ? ? ?[place]  ?Trauma Response RN ? ?Please call TRN at [phone] for further assistance. ? ? ?  ?

## 2022-01-09 NOTE — Assessment & Plan Note (Addendum)
Chest tube was placed on admission, was a spontaneously discontinued on 01/12/2022, followed with serial chest x-rays, -pneumothorax has resolved ?-Cleared by CT surgery to be discharged ? ? ?Pt with recurrent PTx, recent admission for same. ?1. Chest tube re-inserted by trauma surgery ?2. Defer management to them. ?3. Suspect non-operative management at this time. ?1. Will let patient eat. ?4. Norco PRN pain ordered ?1. Caution with opiates given recent delirium  ?

## 2022-01-09 NOTE — ED Notes (Signed)
Pt repeatedly asking for t shirt, RN attempted to educate pt on the inability to wear one r/t chest tube.  ?

## 2022-01-09 NOTE — TOC CAGE-AID Note (Signed)
Transition of Care (TOC) - CAGE-AID Screening ? ? ?Patient Details  ?Name: Adam Swanson. ?MRN: [id number] ?Date of Birth: [birth year] ? ?Transition of Care (TOC) CM/SW Contact:    ?Jessah Danser C Tarpley-[name], LCSWA ?Phone Number: ?01/09/2022, 11:50 AM ? ? ?Clinical Narrative: ?Pt is unable to participate in Cage Aid. ?Pt is experiencing Dementia. ? ?Passenger transport manager, MSW, LCSW-A ?Pronouns:  She/Her/Hers ?[facility] of [facility] ?Clinical Social Worker ?Direct Number:  [phone] ?[email or web address] ? ?CAGE-AID Screening: ?Substance Abuse Screening unable to be completed due to: : Patient unable to participate ? ?  ?  ?  ?  ?  ? ?  ? ?  ? ? ? ? ? ? ?

## 2022-01-09 NOTE — Assessment & Plan Note (Signed)
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 ?1. Continue chronic demeclocycline ?2. Continue salt tabs ?3. Sodium 143 today ?

## 2022-01-09 NOTE — Care Plan (Signed)
This 88 years old male with PMH significant for dementia who was recently admitted to the hospital on 4/18 -4/25 with seizure-like activity,  fall and rib fractures with pneumothorax.  Patient had chest tube insertion.  Hospital course complicated by delirium and new onset A-fib with RVR.  Patient was eventually discharged to SNF on 4/25 after chest tube removal.  Patient had follow-up chest x-ray at [facility] which demonstrated recurrence of pneumothorax as well as effusion.  Patient was sent to the ED and was evaluated by cardiothoracic surgery.  Patient underwent left chest tube insertion.  Tolerated well.  Patient was seen and examined at bedside. He reports feeling better.  Patient is admitted for recurrent pneumothorax requiring chest tube insertion.  Continue management as per CT surgery. ?

## 2022-01-09 NOTE — H&P (Signed)
?History and Physical  ? ? ?Patient: Adam Swanson. NKN:[id number] DOB: 06-20-1928 ?DOA: 01/08/2022 ?DOS: the patient was seen and examined on 01/09/2022 ?PCP: Hulan Fess, MD  ?Patient coming from: SNF ? ?Chief Complaint:  ?Chief Complaint  ?Patient presents with  ? Chest Injury  ? ?HPI: Adam Swanson. is a 88 y.o. male with medical history significant of dementia. ? ?Pt recently admitted to hospital 4/18-4/25 with seizure like activity, fall, rib fractures with PTx. ? ?Pt got chest tube.  Admit complicated by delirium and new onset a.fib RVR. ? ?Pt ultimately discharged to [facility] 4/25 after CT removal. ? ?Today at SNF follow up CXR demonstrated reoccurrence of the PTx as well as effusion.  Pt sent in to ED where CT was placed by trauma surgery. ? ?Pt is a bit sore on his left side (secondary to CT placement) and says he is hungry and wants to eat. ? ?No other complaints. ? ?Review of Systems: As mentioned in the history of present illness. All other systems reviewed and are negative. ?Past Medical History:  ?Diagnosis Date  ? Anxiety   ? Colon polyp   ? hyperplastic  ? Diverticulosis of colon (without mention of hemorrhage)   ? Esophageal stricture   ? GERD (gastroesophageal reflux disease)   ? Hiatal hernia   ? Lower extremity weakness   ? OCD (obsessive compulsive disorder)   ? Viral hepatitis 08/1983  ? ?Past Surgical History:  ?Procedure Laterality Date  ? COLONOSCOPY    ? HEMORRHOID SURGERY    ? 1965  ? ?Social History:  reports that he has never smoked. He has never used smokeless tobacco. He reports current alcohol use of about 7.0 standard drinks per week. He reports that he does not use drugs. ? ?Allergies  ?Allergen Reactions  ? Bee Venom Anaphylaxis  ?  Other reaction(s): Other (See Comments) ?Loss of consciousness  ? Nsaids   ?  Other reaction(s): intol  ? Other Other (See Comments)  ? ? ?Family History  ?Problem Relation Age of Onset  ? Diverticulosis Mother   ? Melanoma Mother   ? Lung cancer  Father   ? Colon cancer Other   ?     1st cousion.  ? Allergic rhinitis Neg Hx   ? Angioedema Neg Hx   ? Asthma Neg Hx   ? Eczema Neg Hx   ? Immunodeficiency Neg Hx   ? Urticaria Neg Hx   ? ? ?Prior to Admission medications   ?Medication Sig Start Date End Date Taking? Authorizing Provider  ?acetaminophen (TYLENOL) 325 MG tablet Take 650 mg by mouth every 6 (six) hours as needed for mild pain.   Yes [provider]  ?acetaminophen (TYLENOL) 500 MG tablet Take 1,000 mg by mouth in the morning and at bedtime.   Yes [provider]  ?demeclocycline (DECLOMYCIN) 150 MG tablet Take 1 tablet (150 mg total) by mouth 2 (two) times daily. ?Patient taking differently: Take 150 mg by mouth 2 (two) times daily. Continuously 08/21/19  Yes Renato Shin, MD  ?EPINEPHrine 0.3 mg/0.3 mL IJ SOAJ injection Inject 0.3 mg into the muscle as needed for anaphylaxis. 11/03/21  Yes [provider]  ?esomeprazole (NEXIUM) 20 MG capsule Take 20 mg by mouth daily.   Yes [provider]  ?famotidine (PEPCID) 20 MG tablet Take 20 mg by mouth every evening.   Yes [provider]  ?furosemide (LASIX) 20 MG tablet TAKE 2 TABLET = 40 MG BY MOUTH  ONCE DAILY ?Patient taking differently: Take 40 mg by mouth daily. 05/17/21  Yes Renato Shin, MD  ?lamoTRIgine (LAMICTAL) 25 MG tablet Take 25 mg nightly for 6 more days, then take 25 mg twice daily for 1 week, then take 50 mg in AM and 25 mg in PM for 1 week then continue taper up as outlined in discharge summary.  25 mg increase per week over 8 weeks with goal 100 BID ?Patient taking differently: Take 25 mg by mouth See admin instructions. 25 mg hs x 6 days, bid x 1 week, 50 mg in the morning and 25 mg hs x 1 week, increase by 25 mg per week over 8 weeks to reach goal of 100 mg bid 01/02/22  Yes Danford, Suann Larry, MD  ?lidocaine (LIDODERM) 5 % Place 1 patch onto the skin daily. Remove & Discard patch within 12 hours or as directed by MD 01/03/22  Yes Danford,  Suann Larry, MD  ?LORazepam (ATIVAN) 0.5 MG tablet Take 0.25 mg by mouth See admin instructions. At bedtime x 7 days for anxiety   Yes [provider]  ?metoprolol tartrate (LOPRESSOR) 25 MG tablet Take 0.5 tablets (12.5 mg total) by mouth 2 (two) times daily. 01/02/22  Yes Danford, Suann Larry, MD  ?QUEtiapine (SEROQUEL) 25 MG tablet Take 1 tablet (25 mg total) by mouth at bedtime. 01/02/22  Yes Danford, Suann Larry, MD  ?rosuvastatin (CRESTOR) 5 MG tablet Take 1 tablet (5 mg total) by mouth at bedtime. 01/02/22 01/02/23 Yes Danford, Suann Larry, MD  ?sodium chloride 1 g tablet TAKE 4 TABLETS = 4 GM BY MOUTH ONCE DAILY ?Patient taking differently: Take 4 g by mouth daily. 02/20/21  Yes Renato Shin, MD  ?tamsulosin (FLOMAX) 0.4 MG CAPS capsule Take 0.4 mg by mouth at bedtime.   Yes [provider]  ?divalproex (DEPAKOTE SPRINKLE) 125 MG capsule Take 4 capsules (500 mg total) by mouth at bedtime for 2 days. ?Patient not taking: Reported on 01/08/2022 01/02/22 01/04/22  Edwin Dada, MD  ?LORazepam (ATIVAN) 0.5 MG tablet Take 0.5 tablets (0.25 mg total) by mouth at bedtime for 4 days. ?Patient not taking: Reported on 01/08/2022 01/02/22 01/06/22  Edwin Dada, MD  ? ? ?Physical Exam: ?Vitals:  ? 01/09/22 0030 01/09/22 0045 01/09/22 0100 01/09/22 0130  ?BP: 128/77 118/81 129/68   ?Pulse: 70 70 63 63  ?Resp: '19 16 15 17  '$ ?Temp:   98.2 ?F (36.8 ?C)   ?TempSrc:   Oral   ?SpO2: 96% 94% 96% 97%  ? ?Constitutional: NAD, calm, comfortable ?Eyes: PERRL, lids and conjunctivae normal ?ENMT: Mucous membranes are moist. Posterior pharynx clear of any exudate or lesions.Normal dentition.  ?Neck: normal, supple, no masses, no thyromegaly ?Respiratory: CT in place on left. ?Cardiovascular: Regular rate and rhythm, no murmurs / rubs / gallops. No extremity edema. 2+ pedal pulses. No carotid bruits.  ?Abdomen: no tenderness, no masses palpated. No hepatosplenomegaly. Bowel sounds positive.   ?Musculoskeletal: no clubbing / cyanosis. No joint deformity upper and lower extremities. Good ROM, no contractures. Normal muscle tone.  ?Skin: no rashes, lesions, ulcers. No induration ?Neurologic: CN 2-12 grossly intact. Sensation intact, DTR normal. Strength 5/5 in all 4.  ?Psychiatric: Normal judgment and insight. Alert and oriented x 3. Normal mood. Fairly lucid at the moment.  Slight confusion about the timing of him being brought in to ED, how long ago he was discharged, etc. ? ?Data Reviewed: ? ?CXR showing recurrent PTx ? ?Assessment and Plan: ?*  Recurrent pneumothorax after chest tube removed ?Pt with recurrent PTx, recent admission for same. ?Chest tube re-inserted by trauma surgery ?Defer management to them. ?Suspect non-operative management at this time. ?Will let patient eat. ?Norco PRN pain ordered ?Caution with opiates given recent delirium during last admit. ? ?Traumatic fracture of ribs with pneumothorax, left, sequela ?Continue IS ? ?A-fib ([place]) ?Looks like he was supposed to start eliquis on Friday after depakote stopped according to DC summary, but doesn't look like this was done. ?On the other hand, not clear that [facility] is a great idea now that he has chest tube with effusion, recent trauma secondary to fall with seizure like activity. ?Im going to hold-off on starting eliquis, and just use DVT ppx lovenox for the moment. ?Cont BID metoprolol ?Tele monitor - had A.Fib last admit. ? ?History of SIADH ?Looks like pt has h/o SIADH, looks like endocrine has him on chronic BID demeclocycline and QID salt tabs. ?Continue chronic demeclocycline ?Continue salt tabs ?Sodium 143 today ? ?Dementia without behavioral disturbance ([place]) ?Cont home meds: seroquel. ?Ativan recently tapered off and stopped due to delirium. ? ?Seizure ([place]) ?On Lamictal titrating up slowly. ? ? ? ? ? Advance Care Planning:   Code Status: Full Code ? ?Consults: Trauma surgery ? ?Family Communication: No family in room ? ?Severity  of Illness: ?The appropriate patient status for this patient is OBSERVATION. Observation status is judged to be reasonable and necessary in order to provide the required intensity of service to ensure the patient's

## 2022-01-10 ENCOUNTER — Inpatient Hospital Stay (HOSPITAL_COMMUNITY): Payer: Medicare Other

## 2022-01-10 DIAGNOSIS — J95811 Postprocedural pneumothorax: Secondary | ICD-10-CM | POA: Diagnosis not present

## 2022-01-10 LAB — COMPREHENSIVE METABOLIC PANEL
ALT: 26 U/L (ref 0–44)
AST: 25 U/L (ref 15–41)
Albumin: 3.3 g/dL — ABNORMAL LOW (ref 3.5–5.0)
Alkaline Phosphatase: 111 U/L (ref 38–126)
Anion gap: 9 (ref 5–15)
BUN: 19 mg/dL (ref 8–23)
CO2: 25 mmol/L (ref 22–32)
Calcium: 9 mg/dL (ref 8.9–10.3)
Chloride: 109 mmol/L (ref 98–111)
Creatinine, Ser: 1.26 mg/dL — ABNORMAL HIGH (ref 0.61–1.24)
GFR, Estimated: 54 mL/min — ABNORMAL LOW (ref >60)
Glucose, Bld: 96 mg/dL (ref 70–99)
Potassium: 3.9 mmol/L (ref 3.5–5.1)
Sodium: 143 mmol/L (ref 135–145)
Total Bilirubin: 1.4 mg/dL — ABNORMAL HIGH (ref 0.3–1.2)
Total Protein: 6.9 g/dL (ref 6.5–8.1)

## 2022-01-10 LAB — CBC
HCT: 40.9 % (ref 39.0–52.0)
Hemoglobin: 13.2 g/dL (ref 13.0–17.0)
MCH: 30.1 pg (ref 26.0–34.0)
MCHC: 32.3 g/dL (ref 30.0–36.0)
MCV: 93.4 fL (ref 80.0–100.0)
Platelets: 281 10*3/uL (ref 150–400)
RBC: 4.38 MIL/uL (ref 4.22–5.81)
RDW: 17 % — ABNORMAL HIGH (ref 11.5–15.5)
WBC: 8.7 10*3/uL (ref 4.0–10.5)
nRBC: 0 % (ref 0.0–0.2)

## 2022-01-10 LAB — PHOSPHORUS: Phosphorus: 4.1 mg/dL (ref 2.5–4.6)

## 2022-01-10 LAB — MAGNESIUM: Magnesium: 2.3 mg/dL (ref 1.7–2.4)

## 2022-01-10 MED ORDER — DILTIAZEM HCL-DEXTROSE 125-5 MG/125ML-% IV SOLN (PREMIX)
5.0000 mg/h | INTRAVENOUS | Status: DC
  Administered 2022-01-10: 5 mg/h via INTRAVENOUS
  Filled 2022-01-10: qty 125

## 2022-01-10 MED ORDER — SODIUM CHLORIDE 0.9 % IV SOLN
INTRAVENOUS | Status: AC
Start: 2022-01-10 — End: 2022-01-11

## 2022-01-10 MED ORDER — AMIODARONE HCL IN DEXTROSE 360-4.14 MG/200ML-% IV SOLN
60.0000 mg/h | INTRAVENOUS | Status: DC
  Filled 2022-01-10: qty 200

## 2022-01-10 MED ORDER — POTASSIUM CHLORIDE 10 MEQ/100ML IV SOLN
10.0000 meq | INTRAVENOUS | Status: AC
  Administered 2022-01-10 (×2): 10 meq via INTRAVENOUS
  Filled 2022-01-10 (×2): qty 100

## 2022-01-10 MED ORDER — AMIODARONE HCL IN DEXTROSE 360-4.14 MG/200ML-% IV SOLN
30.0000 mg/h | INTRAVENOUS | Status: DC
Start: 2022-01-10 — End: 2022-01-10

## 2022-01-10 MED ORDER — AMIODARONE LOAD VIA INFUSION
150.0000 mg | Freq: Once | INTRAVENOUS | Status: DC
Start: 2022-01-10 — End: 2022-01-10
  Filled 2022-01-10: qty 83.34

## 2022-01-10 MED ORDER — METOPROLOL TARTRATE 25 MG PO TABS
25.0000 mg | ORAL_TABLET | Freq: Two times a day (BID) | ORAL | Status: DC
  Administered 2022-01-10 – 2022-01-12 (×4): 25 mg via ORAL
  Filled 2022-01-10 (×5): qty 1

## 2022-01-10 MED ORDER — MAGNESIUM SULFATE 2 GM/50ML IV SOLN
2.0000 g | Freq: Once | INTRAVENOUS | Status: AC
  Administered 2022-01-10: 2 g via INTRAVENOUS
  Filled 2022-01-10: qty 50

## 2022-01-10 NOTE — Progress Notes (Signed)
?PROGRESS NOTE ? ? ? ?[name].  VOJ:[id number] DOB: 1927/11/20 DOA: 01/08/2022 ?PCP: Hulan Fess, MD  ?89/M  years old male with PMH significant for mild dementia, recently admitted to t [facility] 4/18 -4/25 with seizure-like activity,  fall and rib fractures with pneumothorax. -Had a chest tube placed by trauma surgery, hospital course complicated by delirium and new onset A-fib with RVR.  He was seen by palliative care, patient was eventually discharged to SNF on 4/25 after chest tube removal.  ?-Follow-up chest x-ray at SNF which demonstrated recurrence of pneumothorax as well as effusion.  Patient was sent back to the ED ?-Left chest tube was placed by Dr. Windle Guard, trauma surgery 5/1 ?-Overnight 5/2: Developed rapid A-fib, was started on a Cardizem gtt. ? ? ?Subjective: Angry, wants mittens taken off, wants to eat something, has not eaten any thing in 2 days since being in the ED ? ? ?Assessment and Plan: ? ?Recurrent pneumothorax after chest tube removed ?-recent admission for same, developed traumatic pneumothorax related to rib fractures, had a chest tube which was removed prior to discharge to SNF ?-Chest tube reinserted by Dr. Windle Guard in the ED- 5/1 ?-Improvement noted on repeat x-rays, continue chest tube management per trauma surgery ?-Pain control ? ?A-fib RVR ?-Overnight had rapid A-fib, required Cardizem gtt.,, overnight MD also ordered amnio drip however heart rate has improved, this morning off Cardizem, amnio gtt. has not started yet ?-Hold off on amiodarone if heart rate remains stable, continue metoprolol with increased dose ?-Not on anticoagulation since last admission in setting of rib fractures, recurrent pneumothorax ? ?Traumatic fracture of ribs with pneumothorax, left, sequela ?Continue IS ? ?History of SIADH ?-Followed by endocrinology on chronic BID demeclocycline and QID salt tabs. ?-Sodium is in normal range today, continue demeclocycline ? ?Dementia without behavioral disturbance  ([place]) ?Cont home meds: seroquel. ?Ativan recently tapered off and stopped due to delirium. ? ?Seizure ([place]) ?-Recent seizures, Depakote discontinued on 4/27 ?-On a tapering regimen of Lamictal per neurology recommendations, see discharge summary per Dr. Loleta Books 4 /25, this week he is supposed to be on 25 Mg twice daily, to be increased to 50 Mg twice daily from 5/8 ? ? ? ? ?DVT prophylaxis: Lovenox ?Code Status: Full code ?Family Communication: Discussed with patient in detail, no family at bedside ?Disposition Plan: Back to SNF pending resolution of PTX ? ?Consultants:  ?Trauma surgery ? ?Procedures: Chest tube 5/1 ? ?Antimicrobials:  ? ? ?Objective: ?Vitals:  ? 01/10/22 0534 01/10/22 0600 01/10/22 0845 01/10/22 1024  ?BP: (!) 120/99 (!) 82/64 107/75 98/66  ?Pulse: 94 94 97 91  ?Resp: (!) '21 15 20   '$ ?Temp:   98.4 ?F (36.9 ?C)   ?TempSrc:   Oral   ?SpO2: 95% 93%    ? ? ?Intake/Output Summary (Last 24 hours) at 01/10/2022 1136 ?Last data filed at 01/10/2022 1000 ?Gross per 24 hour  ?Intake 240 ml  ?Output 350 ml  ?Net -110 ml  ? ?There were no vitals filed for this visit. ? ?Examination: ? ?General exam: Chronically ill elderly male sitting up in bed, awake alert oriented to self and place, partly to time ?HEENT: No JVD ?CVS: S1-S2, regular rhythm ?Lungs: Poor air movement bilaterally, chest tube to waterseal ?Abdomen: Soft, nontender, bowel sounds present ?Extremities: No edema, mittens on both hands  ?Skin: No rashes ?Psychiatry: Angry and irritable ? ? ? ?Data Reviewed:  ? ?CBC: ?Recent Labs  ?Lab 01/08/22 ?2202 01/09/22 ?0503 01/10/22 ?0244  ?WBC 8.6 9.8  8.7  ?NEUTROABS 5.4  --   --   ?HGB 12.4* 12.5* 13.2  ?HCT 37.6* 39.4 40.9  ?MCV 93.5 94.0 93.4  ?PLT 283 290 281  ? ?Basic Metabolic Panel: ?Recent Labs  ?Lab 01/08/22 ?2202 01/09/22 ?0503 01/10/22 ?0244  ?NA 143 142 143  ?K 3.7 3.3* 3.9  ?CL 109 107 109  ?CO2 '26 27 25  '$ ?GLUCOSE 117* 98 96  ?BUN '14 13 19  '$ ?CREATININE 1.19 1.17 1.26*  ?CALCIUM 8.9 8.8* 9.0  ?MG  --    --  2.3  ?PHOS  --   --  4.1  ? ?GFR: ?Estimated Creatinine Clearance: 30.2 mL/min (A) (by C-G formula based on SCr of 1.26 mg/dL (H)). ?Liver Function Tests: ?Recent Labs  ?Lab 01/10/22 ?4696  ?AST 25  ?ALT 26  ?ALKPHOS 111  ?BILITOT 1.4*  ?PROT 6.9  ?ALBUMIN 3.3*  ? ?No results for input(s): LIPASE, AMYLASE in the last 168 hours. ?No results for input(s): AMMONIA in the last 168 hours. ?Coagulation Profile: ?No results for input(s): INR, PROTIME in the last 168 hours. ?Cardiac Enzymes: ?No results for input(s): CKTOTAL, CKMB, CKMBINDEX, TROPONINI in the last 168 hours. ?BNP (last 3 results) ?No results for input(s): PROBNP in the last 8760 hours. ?HbA1C: ?No results for input(s): HGBA1C in the last 72 hours. ?CBG: ?No results for input(s): GLUCAP in the last 168 hours. ?Lipid Profile: ?No results for input(s): CHOL, HDL, LDLCALC, TRIG, CHOLHDL, LDLDIRECT in the last 72 hours. ?Thyroid Function Tests: ?No results for input(s): TSH, T4TOTAL, FREET4, T3FREE, THYROIDAB in the last 72 hours. ?Anemia Panel: ?No results for input(s): VITAMINB12, FOLATE, FERRITIN, TIBC, IRON, RETICCTPCT in the last 72 hours. ?Urine analysis: ?   ?Component Value Date/Time  ? COLORURINE YELLOW 12/14/2021 1443  ? APPEARANCEUR CLEAR 12/14/2021 1443  ? LABSPEC 1.016 12/14/2021 1443  ? PHURINE 5.5 12/14/2021 1443  ? GLUCOSEU NEGATIVE 12/14/2021 1443  ? GLUCOSEU NEGATIVE 11/12/2017 1110  ? HGBUR NEGATIVE 12/14/2021 1443  ? BILIRUBINUR NEGATIVE 12/14/2021 1443  ? BILIRUBINUR negative 01/21/2017 1438  ? KETONESUR NEGATIVE 12/14/2021 1443  ? PROTEINUR NEGATIVE 12/14/2021 1443  ? UROBILINOGEN 1.0 11/12/2017 1110  ? NITRITE NEGATIVE 12/14/2021 1443  ? LEUKOCYTESUR NEGATIVE 12/14/2021 1443  ? ?Sepsis Labs: ?'@LABRCNTIP'$ (procalcitonin:4,lacticidven:4) ? ?)No results found for this or any previous visit (from the past 240 hour(s)).  ? ?Radiology Studies: ?DG CHEST PORT 1 VIEW ? ?Result Date: 01/10/2022 ?CLINICAL DATA:  Pneumothorax EXAM: PORTABLE CHEST 1  VIEW COMPARISON:  Chest radiograph 01/09/2022 FINDINGS: The patient is significantly rotated to the right. The pigtail catheter coiled in the left apex is unchanged. The cardiomediastinal silhouette is grossly stable allowing for rightward patient rotation. Residual left pleural fluid is similar to the prior study. There is confluent opacity in the left lower lobe which is increased in conspicuity compared to the prior study, likely at least in part due to differences in patient rotation. There is no appreciable pneumothorax. Aeration of the right lung is unchanged, with no new or worsening focal airspace disease. There is a probable hiatal hernia. There is no acute osseous abnormality. IMPRESSION: 1. Stable left-sided pigtail catheter with no appreciable pneumothorax. Residual left pleural fluid is not significantly changed. 2. Confluent opacity in the left lower lobe is increased in conspicuity compared to the prior study, likely at least in part due to patient positioning. This may reflect a combination of airspace disease and/or hiatal hernia. Electronically Signed   By: Valetta Mole M.D.   On: 01/10/2022 08:39  ? ?  DG Chest Port 1 View ? ?Result Date: 01/09/2022 ?CLINICAL DATA:  Encounter for chest tube EXAM: PORTABLE CHEST 1 VIEW COMPARISON:  01/08/2022 FINDINGS: Left chest tube with retention loop at the apex. No visible pneumothorax. Decrease in left pleural effusion. Left lower lobe aeration is also improved. Stable cardiac enlargement. IMPRESSION: 1. Stable left chest tube positioning.  No visible pneumothorax. 2. Improved left lower lobe aeration and decreased left pleural effusion. Electronically Signed   By: Jorje Guild M.D.   On: 01/09/2022 06:11  ? ?DG Chest Portable 1 View ? ?Result Date: 01/08/2022 ?CLINICAL DATA:  Chest tube placement EXAM: PORTABLE CHEST 1 VIEW COMPARISON:  01/08/2022 FINDINGS: A pigtail left chest tube has been placed. Tip is at the left lung apex. Left lung has re-expanded with  mild pulmonary edema. Small left pleural effusion. No residual pneumothorax. Right lung is clear. IMPRESSION: Placement of pigtail left chest tube with re-expansion of the left lung. Mild re-expansion pulmo

## 2022-01-10 NOTE — Progress Notes (Signed)
? ?Progress Note ? ?   ?Subjective: ?He is very fixated on calling his son. He denies respiratory complaints. He denies pain. He is feeling a little nauseas and no BM in 2 days ? ?Patient oriented to self, place, and situation. ? ?Objective: ?Vital signs in last 24 hours: ?Temp:  [97.7 ?F (36.5 ?C)-98.4 ?F (36.9 ?C)] 98.4 ?F (36.9 ?C) (05/03 0845) ?Pulse Rate:  [30-97] 91 (05/03 1024) ?Resp:  [14-24] 20 (05/03 0845) ?BP: (82-153)/(64-99) 98/66 (05/03 1024) ?SpO2:  [90 %-100 %] 93 % (05/03 0600) ?Last BM Date :  (PTA) ? ?Intake/Output from previous day: ?05/02 0701 - 05/03 0700 ?In: -  ?Out: 350 [Urine:200; Chest Tube:150] ?Intake/Output this shift: ?No intake/output data recorded. ? ?PE: ?General: pleasant, WD, male who is laying in bed in NAD ?Heart: regular, rate, and rhythm  ?Lungs: CTAB, no wheezes, rhonchi, or rales noted.  Respiratory effort nonlabored on room air. CT in place with bandage c/d/I. To suction. No air leak. 200 ml SS fluid in cannister ?Abd: soft, NT, ND ?MSK: all 4 extremities are symmetrical with no cyanosis, clubbing, or edema. ?Skin: warm and dry with no masses, lesions, or rashes ? ? ?Lab Results:  ?Recent Labs  ?  01/09/22 ?0503 01/10/22 ?0244  ?WBC 9.8 8.7  ?HGB 12.5* 13.2  ?HCT 39.4 40.9  ?PLT 290 281  ? ? ?BMET ?Recent Labs  ?  01/09/22 ?0503 01/10/22 ?0244  ?NA 142 143  ?K 3.3* 3.9  ?CL 107 109  ?CO2 27 25  ?GLUCOSE 98 96  ?BUN 13 19  ?CREATININE 1.17 1.26*  ?CALCIUM 8.8* 9.0  ? ? ?PT/INR ?No results for input(s): LABPROT, INR in the last 72 hours. ?CMP  ?   ?Component Value Date/Time  ? NA 143 01/10/2022 0244  ? K 3.9 01/10/2022 0244  ? CL 109 01/10/2022 0244  ? CO2 25 01/10/2022 0244  ? GLUCOSE 96 01/10/2022 0244  ? BUN 19 01/10/2022 0244  ? CREATININE 1.26 (H) 01/10/2022 0244  ? CALCIUM 9.0 01/10/2022 0244  ? PROT 6.9 01/10/2022 0244  ? ALBUMIN 3.3 (L) 01/10/2022 0244  ? AST 25 01/10/2022 0244  ? ALT 26 01/10/2022 0244  ? ALKPHOS 111 01/10/2022 0244  ? BILITOT 1.4 (H) 01/10/2022  0244  ? GFRNONAA 54 (L) 01/10/2022 0244  ? GFRAA >60 11/06/2015 0933  ? ?Lipase  ?   ?Component Value Date/Time  ? LIPASE 36 12/14/2021 1443  ? ? ? ? ? ?Studies/Results: ?DG CHEST PORT 1 VIEW ? ?Result Date: 01/10/2022 ?CLINICAL DATA:  Pneumothorax EXAM: PORTABLE CHEST 1 VIEW COMPARISON:  Chest radiograph 01/09/2022 FINDINGS: The patient is significantly rotated to the right. The pigtail catheter coiled in the left apex is unchanged. The cardiomediastinal silhouette is grossly stable allowing for rightward patient rotation. Residual left pleural fluid is similar to the prior study. There is confluent opacity in the left lower lobe which is increased in conspicuity compared to the prior study, likely at least in part due to differences in patient rotation. There is no appreciable pneumothorax. Aeration of the right lung is unchanged, with no new or worsening focal airspace disease. There is a probable hiatal hernia. There is no acute osseous abnormality. IMPRESSION: 1. Stable left-sided pigtail catheter with no appreciable pneumothorax. Residual left pleural fluid is not significantly changed. 2. Confluent opacity in the left lower lobe is increased in conspicuity compared to the prior study, likely at least in part due to patient positioning. This may reflect a combination of airspace disease  and/or hiatal hernia. Electronically Signed   By: Valetta Mole M.D.   On: 01/10/2022 08:39  ? ?DG Chest Port 1 View ? ?Result Date: 01/09/2022 ?CLINICAL DATA:  Encounter for chest tube EXAM: PORTABLE CHEST 1 VIEW COMPARISON:  01/08/2022 FINDINGS: Left chest tube with retention loop at the apex. No visible pneumothorax. Decrease in left pleural effusion. Left lower lobe aeration is also improved. Stable cardiac enlargement. IMPRESSION: 1. Stable left chest tube positioning.  No visible pneumothorax. 2. Improved left lower lobe aeration and decreased left pleural effusion. Electronically Signed   By: Jorje Guild M.D.   On:  01/09/2022 06:11  ? ?DG Chest Portable 1 View ? ?Result Date: 01/08/2022 ?CLINICAL DATA:  Chest tube placement EXAM: PORTABLE CHEST 1 VIEW COMPARISON:  01/08/2022 FINDINGS: A pigtail left chest tube has been placed. Tip is at the left lung apex. Left lung has re-expanded with mild pulmonary edema. Small left pleural effusion. No residual pneumothorax. Right lung is clear. IMPRESSION: Placement of pigtail left chest tube with re-expansion of the left lung. Mild re-expansion pulmonary edema and small left pleural effusion. Electronically Signed   By: Ulyses Jarred M.D.   On: 01/08/2022 23:44  ? ?DG Chest Port 1 View ? ?Result Date: 01/08/2022 ?CLINICAL DATA:  Post fall with pneumothorax EXAM: PORTABLE CHEST 1 VIEW COMPARISON:  01/01/2022, 12/31/2021 FINDINGS: Increased left-sided pneumothorax now moderate to large in size, estimated at 50%. No midline shift. Left pleural effusion. Increased atelectasis and or airspace disease of the left lung. Enlarged cardiomediastinal silhouette. Multiple left rib fractures IMPRESSION: 1. Interim enlargement of left pneumothorax now moderate to large in size estimated at 50%. No midline shift. 2. Pleural effusion and airspace disease in the left lung. 3. Cardiomegaly Critical Value/emergent results were called by telephone at the time of interpretation on 01/08/2022 at 10:36 pm to provider DAN FLOYD , who verbally acknowledged these results. Electronically Signed   By: Donavan Foil M.D.   On: 01/08/2022 22:37   ? ?Anti-infectives: ?Anti-infectives (From admission, onward)  ? ? Start     Dose/Rate Route Frequency Ordered Stop  ? 01/09/22 0330  demeclocycline (DECLOMYCIN) tablet 150 mg       ?Note to Pharmacy: Patient taking differently: Continuously    ? 150 mg Oral 2 times daily 01/09/22 0329    ? ?  ? ? ? ?Assessment/Plan ?GLF at SNF ?L 6-9 rib fx with L PTX - recurrent PTX and effusion on xray 5/1 s/p CT placement in ED 5/1. Am cxr with stable effusion. CT to [facility] today. Repeat am  tomorrow CXR Recommend aggressive pulmonary toilet and IS ?New onset seizures - diagnosed last admission and neuro consulted, MR brain showed possible stroke. On lamictal ?A. Fib with RVR - per TRH. Prev on eliquis. Now held ?CKD stage IIIa - per TRH, stable ?Delirium - per TRH, prn haldol  ?  ?FEN: HH ?VTE: SCDs, SQH ?ID: no current abx ? ?I reviewed hospitalist notes, last 24 h vitals and pain scores, last 48 h intake and output, last 24 h labs and trends, and last 24 h imaging results. ? ? LOS: 1 day  ? ?Winferd Humphrey, PA-C ?[facility] ?01/10/2022, 10:39 AM ?Please see Amion for pager number during day hours 7:00am-4:30pm ? ?

## 2022-01-10 NOTE — Progress Notes (Signed)
?  X-cover Note: ?Called to bedside. Pt remains in rapid afib. When cardizem gts increased to 10 mg/hr, pt's SBP <80.  Gtts decreased to 5 mg/hr. HR still in the 140-150s. ? ?Pt agitated and states he wants to go to sleep. ? ?In reviewing his EKG, was in afib on 12-26-2021.  Echo showed normal LVEF.  01-08-2022 ekg showed NSR.  Reportedly this is new onset. Pt was suppose to start Eliquis this Friday. Unclear if he did start it or not. ? ?Will start amiodarone gtts, knowing that it has a higher rate of conversion to NSR. Pt has not been on systemic anticoagulation. But given his tachycardia of 150s at age 88, cannot leave him this tachycardic for much longer. ? ?Considered IV digoxin, but given his age and Scr of 1.26, not a great option. ? ?Other option is cardioversion. Not emergent at this point. Will need cardiology to see him. ? ?Kristopher Oppenheim, DO ?[facility] ? ?

## 2022-01-10 NOTE — Progress Notes (Signed)
?  Transition of Care (TOC) Screening Note ? ? ?Patient Details  ?Name: Adam Swanson. ?Date of Birth: 18-Sep-1927 ? ? ?Transition of Care (TOC) CM/SW Contact:    ?Cyndi Bender, RN ?Phone Number: ?01/10/2022, 9:18 AM ? ? ? ?Transition of Care Department [facility]) has reviewed patient and no TOC needs have been identified at this time. We will continue to monitor patient advancement through interdisciplinary progression rounds. If new patient transition needs arise, please place a TOC consult. ? ? ?

## 2022-01-10 NOTE — Plan of Care (Signed)
?  Problem: Education: ?Goal: Knowledge of General Education information will improve ?Description: Including pain rating scale, medication(s)/side effects and non-pharmacologic comfort measures ?Outcome: Progressing ?  ?Problem: Health Behavior/Discharge Planning: ?Goal: Ability to manage health-related needs will improve ?Outcome: Progressing ?  ?Problem: Clinical Measurements: ?Goal: Ability to maintain clinical measurements within normal limits will improve ?Outcome: Progressing ?Goal: Will remain free from infection ?Outcome: Progressing ?Goal: Diagnostic test results will improve ?Outcome: Progressing ?Goal: Respiratory complications will improve ?Outcome: Progressing ?Goal: Cardiovascular complication will be avoided ?Outcome: Progressing ?  ?Problem: Activity: ?Goal: Risk for activity intolerance will decrease ?Outcome: Progressing ?  ?Problem: Nutrition: ?Goal: Adequate nutrition will be maintained ?Outcome: Progressing ?  ?Problem: Coping: ?Goal: Level of anxiety will decrease ?Outcome: Progressing ?  ?Problem: Elimination: ?Goal: Will not experience complications related to bowel motility ?Outcome: Progressing ?Goal: Will not experience complications related to urinary retention ?Outcome: Progressing ?  ?Problem: Pain Managment: ?Goal: General experience of comfort will improve ?Outcome: Progressing ?  ?Problem: Safety: ?Goal: Ability to remain free from injury will improve ?Outcome: Progressing ?  ?Problem: Skin Integrity: ?Goal: Risk for impaired skin integrity will decrease ?Outcome: Progressing ?  ?Spoke with patients son [name] earlier is shift, updated. ?

## 2022-01-10 NOTE — Progress Notes (Signed)
?  X-cover Note: ?RN reports pt now in rapid afib. HR 140. Will start cardizem. Given new chest tube, may need to hold off systemic anticoagulation for now. ? ?Get EKG ?Replete K and Mag ? ?Kristopher Oppenheim, DO ?[facility] ? ?

## 2022-01-11 ENCOUNTER — Inpatient Hospital Stay (HOSPITAL_COMMUNITY): Payer: Medicare Other

## 2022-01-11 DIAGNOSIS — J95811 Postprocedural pneumothorax: Secondary | ICD-10-CM | POA: Diagnosis not present

## 2022-01-11 MED ORDER — SODIUM CHLORIDE 0.9 % IV SOLN: INTRAVENOUS | Status: AC

## 2022-01-11 MED ORDER — FUROSEMIDE 40 MG PO TABS
40.0000 mg | ORAL_TABLET | Freq: Every day | ORAL | Status: DC
  Administered 2022-01-12: 40 mg via ORAL
  Filled 2022-01-11: qty 1

## 2022-01-11 NOTE — Evaluation (Signed)
Physical Therapy Evaluation ?Patient Details ?Name: Adam Swanson. ?MRN: [id number] ?DOB: 05/31/1928 ?Today's Date: 01/11/2022 ? ?History of Present Illness ? The pt is a 88 yo male presenting 5/1 with L pneumothorax and L pleural effusion after fall on 4/18 (pt had been admitted 4/18 and d/c to [facility] 4/25). Chest tube placed 5/2, pt developed rapid afib. PMH includes: dementia, fall 4/18 with L rib fx 6-9, CKD III, GERD, and anxiety. ?  ?Clinical Impression ? Pt in bed upon arrival of PT, agreeable to evaluation at this time. Prior to admission the pt was mobilizing with use of RW, working with therapy at [facility]. He was unable to give consistent answers to questions regarding PLOF or assist needed for ADLs at the SNF. The pt now presents with limitations in functional mobility, stability, strength, endurance, and safety awareness due to above dx, and will continue to benefit from skilled PT to address these deficits. The pt was able to complete initial sit-stand transfers with modA due to poor balance, anterior wt shift, and strength in BLE, but was then able to ambulate short distances in the room with minA and use of RW. The pt demos poor memory of education and instructions provided within the session, returned to bed with alarm and mitts for safety. The pt will continue to benefit from skilled PT acutely to progress endurance and dynamic stability, recommend return to SNF when medically stable for d/c.  ?   ?   ? ?Recommendations for follow up therapy are one component of a multi-disciplinary discharge planning process, led by the attending physician.  Recommendations may be updated based on patient status, additional functional criteria and insurance authorization. ? ?Follow Up Recommendations Skilled nursing-short term rehab (<3 hours/day) ? ?  ?Assistance Recommended at Discharge Frequent or constant Supervision/Assistance  ?Patient can return home with the following ? A lot of help with walking and/or  transfers;A lot of help with bathing/dressing/bathroom;Assistance with cooking/housework;Direct supervision/assist for medications management;Direct supervision/assist for financial management;Assist for transportation;Help with stairs or ramp for entrance ? ?  ?Equipment Recommendations None recommended by PT  ?Recommendations for Other Services ?    ?  ?Functional Status Assessment Patient has had a recent decline in their functional status and demonstrates the ability to make significant improvements in function in a reasonable and predictable amount of time.  ? ?  ?Precautions / Restrictions Precautions ?Precautions: Fall;Other (comment) ?Precaution Comments: L chest tube, delirious last admission ?Restrictions ?Weight Bearing Restrictions: No  ? ?  ? ?Mobility ? Bed Mobility ?Overal bed mobility: Needs Assistance ?Bed Mobility: Supine to Sit, Sit to Supine ?  ?  ?Supine to sit: Min assist ?Sit to supine: Min assist ?  ?General bed mobility comments: minA to complete movement and for line management. minA to reposition and boost in bed ?  ? ?Transfers ?Overall transfer level: Needs assistance ?Equipment used: Rolling walker (2 wheels) ?Transfers: Sit to/from Stand, Bed to chair/wheelchair/BSC ?Sit to Stand: Mod assist ?  ?Step pivot transfers: Min assist ?  ?  ?  ?General transfer comment: pt initially needing modA to power up, but progressed with continued reps, max cues for hand placement with poor power up through BLE ?  ? ?Ambulation/Gait ?Ambulation/Gait assistance: Min assist ?Gait Distance (Feet): 15 Feet (+ 18 ft) ?Assistive device: Rolling walker (2 wheels) ?Gait Pattern/deviations: Step-through pattern, Decreased stride length, Knee flexed in stance - right, Knee flexed in stance - left, Shuffle, Trunk flexed, Narrow base of support ?Gait velocity: decreased ?  ?  ?  General Gait Details: max cues for slowing down, cues for posture in RW and maintaining closer proximity. ? ?  ? ?Balance Overall balance  assessment: Needs assistance ?Sitting-balance support: Feet supported, No upper extremity supported ?Sitting balance-Leahy Scale: Fair ?  ?  ?Standing balance support: Bilateral upper extremity supported, During functional activity ?Standing balance-Leahy Scale: Poor ?Standing balance comment: dependent on BUE support and minA to steady ?  ?  ?  ?  ?  ?  ?  ?  ?  ?  ?  ?   ? ? ? ?Pertinent Vitals/Pain Pain Assessment ?Pain Assessment: No/denies pain ?Pain Intervention(s): Monitored during session  ? ? ?Home Living Family/patient expects to be discharged to:: Skilled nursing facility ?  ?  ?  ?  ?  ?  ?  ?  ?  ?Additional Comments: From [place]  ?  ?Prior Function Prior Level of Function : Patient poor historian/Family not available ?  ?  ?  ?  ?  ?  ?Mobility Comments: per chart, ambulatory with walker. when asked directly, pt sometimes answers walker and sometimes answers "without anything". unable to give reliable answers about other PLOF ?ADLs Comments: pt unable to provide info from SNF, pt previous from ILF and therefore likely independent ?  ? ? ?Hand Dominance  ? Dominant Hand: Right ? ?  ?Extremity/Trunk Assessment  ? Upper Extremity Assessment ?Upper Extremity Assessment: Generalized weakness ?  ? ?Lower Extremity Assessment ?Lower Extremity Assessment: Generalized weakness ?  ? ?Cervical / Trunk Assessment ?Cervical / Trunk Assessment: Kyphotic  ?Communication  ? Communication: Other (comment) (dysarthric)  ?Cognition Arousal/Alertness: Awake/alert ?Behavior During Therapy: Restless, Impulsive ?Overall Cognitive Status: No family/caregiver present to determine baseline cognitive functioning ?Area of Impairment: Attention, Memory, Safety/judgement, Awareness, Problem solving ?  ?  ?  ?  ?  ?  ?  ?  ?  ?Current Attention Level: Focused ?Memory: Decreased short-term memory ?  ?Safety/Judgement: Decreased awareness of safety, Decreased awareness of deficits ?Awareness: Intellectual ?Problem Solving:  Slow processing, Decreased initiation, Difficulty sequencing, Requires verbal cues ?General Comments: pt with hx of STM deficits and episodes of delirium during admissions. Pt restless and attempting to get out of bed upon my arrival, unable to recal instructions about caling for staff assist despite numerous attempts to re-orient and repeated instructions. needing significant repetition of cues, instructions. pt asking "what do I do now?" every ~30 seconds. ?  ?  ? ?  ?General Comments General comments (skin integrity, edema, etc.): SpO2 90% on RA, BP stable with all activity. pt in mitts ? ?  ?   ? ?Assessment/Plan  ?  ?PT Assessment Patient needs continued PT services  ?PT Problem List Decreased strength;Decreased range of motion;Decreased activity tolerance;Decreased balance;Decreased mobility;Decreased coordination;Decreased cognition;Decreased safety awareness ? ?   ?  ?PT Treatment Interventions DME instruction;Gait training;Stair training;Functional mobility training;Therapeutic activities;Therapeutic exercise;Balance training;Patient/family education   ? ?PT Goals (Current goals can be found in the Care Plan section)  ?Acute Rehab PT Goals ?Patient Stated Goal: to use the bathroom ?PT Goal Formulation: With patient ?Time For Goal Achievement: 01/25/22 ?Potential to Achieve Goals: Fair ? ?  ?Frequency Min 3X/week ?  ? ? ?   ?AM-PAC PT "6 Clicks" Mobility  ?Outcome Measure Help needed turning from your back to your side while in a flat bed without using bedrails?: A Little ?Help needed moving from lying on your back to sitting on the side of a flat bed without using bedrails?: A Little ?Help  needed moving to and from a bed to a chair (including a wheelchair)?: A Lot ?Help needed standing up from a chair using your arms (e.g., wheelchair or bedside chair)?: A Little ?Help needed to walk in hospital room?: A Lot ?Help needed climbing 3-5 steps with a railing? : A Lot ?6 Click Score: 15 ? ?  ?End of Session  Equipment Utilized During Treatment: Gait belt ?Activity Tolerance: Patient tolerated treatment well ?Patient left: in bed;with call bell/phone within reach;with bed alarm set;with restraints reapplied ?Nurse [employer]

## 2022-01-11 NOTE — Progress Notes (Signed)
Patients son Adam Swanson at desk and upset that no doctors or specialists have reached out to him for an update. Message via secure messaging and page via [place] left for Dr. Roger Shelter to reach out to patients son Adam Swanson for update. Patient remains alert with confusion and in no distress. ?

## 2022-01-11 NOTE — Progress Notes (Signed)
?PROGRESS NOTE ? ? ? ?Adam Swanson.  WCB:[id number] DOB: 21-Aug-1928 DOA: 01/08/2022 ?PCP: Hulan Fess, MD  ?72/M  years old male with PMH significant for mild dementia, recently admitted to t [facility] 4/18 -4/25 with seizure-like activity,  fall and rib fractures with pneumothorax. -Had a chest tube placed by trauma surgery, hospital course complicated by delirium and new onset A-fib with RVR.  He was seen by palliative care, patient was eventually discharged to SNF on 4/25 after chest tube removal.  ?-Follow-up chest x-ray at SNF which demonstrated recurrence of pneumothorax as well as effusion.  Patient was sent back to the ED ?-Left chest tube was placed by Dr. Windle Guard, trauma surgery 5/1 ?-Overnight 5/2: Developed rapid A-fib, was started on a Cardizem gtt. ? ? ?Subjective:  ?The patient was seen and examined this morning, awake alert, anxious, upset ?Requesting nurse assistant to assist with urination but has a condom cath in place ? ?Stable, chest tube still in place complaining of discomfort of chest tube at the site. ? ? ?Assessment and Plan: ? ?Recurrent pneumothorax after chest tube removed ?-For air entry left greater than right, satting 96% on room air ?-recent admission for same, developed traumatic pneumothorax related to rib fractures, had a chest tube which was removed prior to discharge to SNF ?-Chest tube reinserted by Dr. Windle Guard in the ED- 5/1 ?-Improvement noted on repeat x-rays, continue chest tube management per trauma surgery ?-Pain control ? ?A-fib RVR ?-Stable denies any chest pain ?-Paroxysmal A-fib, A-fib with RVR on the night of 01/10/2022 status post Cardizem drip, amiodarone >>> episode resolved ->> sinus rhythm now heart rate 57 ? ? ? ?-Hold off on amiodarone if heart rate remains stable, continue metoprolol with increased dose ?-Not on anticoagulation since last admission in setting of rib fractures, recurrent pneumothorax ? ?Traumatic fracture of ribs with pneumothorax, left,  sequela ?Continue IS ? ? ?History of SIADH ?-Followed by endocrinology on chronic BID demeclocycline and QID salt tabs. ?-Sodium is in normal range today, continue demeclocycline ?-Electrolytes within normal limits sodium 142, 142, 143 ?-Potassium 3.3, 3.9 today ? ?Dementia without behavioral disturbance ([place]) ?Cont home meds: seroquel. ?Ativan recently tapered off and stopped due to delirium. ? ?Seizure ([place]) ?-Recent seizures, Depakote discontinued on 4/27 ?-On a tapering regimen of Lamictal per neurology recommendations, see discharge summary per Dr. Loleta Books 4 /25, this week he is supposed to be on 25 Mg twice daily, to be increased to 50 Mg twice daily from 5/8 ? ? ? ? ?DVT prophylaxis: Lovenox ?Code Status: Full code ?Family Communication: Discussed with patient in detail, no family at bedside ?Disposition Plan: Back to SNF pending resolution of PTX ? ?Consultants:  ?Trauma surgery ? ?Procedures: Chest tube 5/1 ? ?Antimicrobials:  ? ? ?Objective: ?Vitals:  ? 01/10/22 2300 01/11/22 0300 01/11/22 0900 01/11/22 1246  ?BP: (!) 109/96 104/78 (!) 121/98 110/79  ?Pulse: 67 60 64 (!) 57  ?Resp: '13 13 17 19  '$ ?Temp: 98.1 ?F (36.7 ?C) 98 ?F (36.7 ?C) 97.8 ?F (36.6 ?C) 97.6 ?F (36.4 ?C)  ?TempSrc: Oral Oral Oral Oral  ?SpO2: 90% 97% 91% 96%  ? ? ?Intake/Output Summary (Last 24 hours) at 01/11/2022 1323 ?Last data filed at 01/11/2022 1033 ?Gross per 24 hour  ?Intake 1084.31 ml  ?Output 830 ml  ?Net 254.31 ml  ? ?There were no vitals filed for this visit. ? ? ? ?Physical Exam: ?  ?General:  AAO x 3,  cooperative, no distress;   ?HEENT:  Normocephalic, PERRL,  otherwise with in Normal limits   ?Neuro:  CNII-XII intact. , normal motor and sensation, reflexes intact   ?Lungs:   Chest tube in place left side, poor air entry left greater than right  ? chest tube to waterseal ?Respirations unlabored,  ?No wheezes / crackles  ?Cardio:    S1/S2, RRR, No murmure, No Rubs or Gallops   ?Abdomen:  Soft, non-tender, bowel sounds active all  four quadrants, ?no guarding or peritoneal signs.  ?Muscular  ?skeletal:  Limited exam -global generalized weaknesses ?- in bed, able to move all 4 extremities,   ?2+ pulses,  symmetric, No pitting edema  ?Skin:  Dry, warm to touch, negative for any Rashes,  ?Wounds: Please see nursing documentation ?   ? ? ?  ? ?Data Reviewed:  ? ?CBC: ?Recent Labs  ?Lab 01/08/22 ?2202 01/09/22 ?0503 01/10/22 ?0244  ?WBC 8.6 9.8 8.7  ?NEUTROABS 5.4  --   --   ?HGB 12.4* 12.5* 13.2  ?HCT 37.6* 39.4 40.9  ?MCV 93.5 94.0 93.4  ?PLT 283 290 281  ? ?Basic Metabolic Panel: ?Recent Labs  ?Lab 01/08/22 ?2202 01/09/22 ?0503 01/10/22 ?0244  ?NA 143 142 143  ?K 3.7 3.3* 3.9  ?CL 109 107 109  ?CO2 '26 27 25  '$ ?GLUCOSE 117* 98 96  ?BUN '14 13 19  '$ ?CREATININE 1.19 1.17 1.26*  ?CALCIUM 8.9 8.8* 9.0  ?MG  --   --  2.3  ?PHOS  --   --  4.1  ? ?GFR: ?Estimated Creatinine Clearance: 30.2 mL/min (A) (by C-G formula based on SCr of 1.26 mg/dL (H)). ?Liver Function Tests: ?Recent Labs  ?Lab 01/10/22 ?1610  ?AST 25  ?ALT 26  ?ALKPHOS 111  ?BILITOT 1.4*  ?PROT 6.9  ?ALBUMIN 3.3*  ? ?No results for input(s): LIPASE, AMYLASE in the last 168 hours. ?No results for input(s): AMMONIA in the last 168 hours. ?Coagulation Profile: ?No results for input(s): INR, PROTIME in the last 168 hours. ?Cardiac Enzymes: ?No results for input(s): CKTOTAL, CKMB, CKMBINDEX, TROPONINI in the last 168 hours. ?BNP (last 3 results) ?No results for input(s): PROBNP in the last 8760 hours. ?HbA1C: ?No results for input(s): HGBA1C in the last 72 hours. ?CBG: ?No results for input(s): GLUCAP in the last 168 hours. ?Lipid Profile: ?No results for input(s): CHOL, HDL, LDLCALC, TRIG, CHOLHDL, LDLDIRECT in the last 72 hours. ?Thyroid Function Tests: ?No results for input(s): TSH, T4TOTAL, FREET4, T3FREE, THYROIDAB in the last 72 hours. ?Anemia Panel: ?No results for input(s): VITAMINB12, FOLATE, FERRITIN, TIBC, IRON, RETICCTPCT in the last 72 hours. ?Urine analysis: ?   ?Component Value  Date/Time  ? COLORURINE YELLOW 12/14/2021 1443  ? APPEARANCEUR CLEAR 12/14/2021 1443  ? LABSPEC 1.016 12/14/2021 1443  ? PHURINE 5.5 12/14/2021 1443  ? GLUCOSEU NEGATIVE 12/14/2021 1443  ? GLUCOSEU NEGATIVE 11/12/2017 1110  ? HGBUR NEGATIVE 12/14/2021 1443  ? BILIRUBINUR NEGATIVE 12/14/2021 1443  ? BILIRUBINUR negative 01/21/2017 1438  ? KETONESUR NEGATIVE 12/14/2021 1443  ? PROTEINUR NEGATIVE 12/14/2021 1443  ? UROBILINOGEN 1.0 11/12/2017 1110  ? NITRITE NEGATIVE 12/14/2021 1443  ? LEUKOCYTESUR NEGATIVE 12/14/2021 1443  ? ?Sepsis Labs: ?'@LABRCNTIP'$ (procalcitonin:4,lacticidven:4) ? ?)No results found for this or any previous visit (from the past 240 hour(s)).  ? ?Radiology Studies: ?DG CHEST PORT 1 VIEW ? ?Result Date: 01/11/2022 ?CLINICAL DATA:  Pneumothorax. EXAM: PORTABLE CHEST 1 VIEW COMPARISON:  Jan 10, 2022. FINDINGS: Stable cardiomegaly. Left-sided chest tube is unchanged in position. No pneumothorax is noted. Right lung is clear. Stable left basilar  opacity is noted which may represent atelectasis, infiltrate or possible hiatal hernia. Bony thorax is unremarkable. IMPRESSION: Stable position of left-sided chest tube without pneumothorax. Stable left basilar opacity as described above. Electronically Signed   By: Marijo Conception M.D.   On: 01/11/2022 08:02  ? ?DG CHEST PORT 1 VIEW ? ?Result Date: 01/10/2022 ?CLINICAL DATA:  Pneumothorax EXAM: PORTABLE CHEST 1 VIEW COMPARISON:  Chest radiograph 01/09/2022 FINDINGS: The patient is significantly rotated to the right. The pigtail catheter coiled in the left apex is unchanged. The cardiomediastinal silhouette is grossly stable allowing for rightward patient rotation. Residual left pleural fluid is similar to the prior study. There is confluent opacity in the left lower lobe which is increased in conspicuity compared to the prior study, likely at least in part due to differences in patient rotation. There is no appreciable pneumothorax. Aeration of the right lung is  unchanged, with no new or worsening focal airspace disease. There is a probable hiatal hernia. There is no acute osseous abnormality. IMPRESSION: 1. Stable left-sided pigtail catheter with no appreciable pneumothorax. Res

## 2022-01-11 NOTE — Progress Notes (Signed)
To all Disciplines please update son Adam Swanson daily at [phone] ?

## 2022-01-11 NOTE — Progress Notes (Signed)
Dr. Roger Shelter did return call to this nurse and stated that he is in a meeting and would update patients son [name] first thing in the a.m. (Called from personal cell). ?

## 2022-01-11 NOTE — Progress Notes (Signed)
? ?Progress Note ? ?   ?Subjective: ?A little agitated this morning and fixated on eating breakfast. NT and RN in room - he has been pulling on his lines including chest tube. He denies any complaints including respiratory complaints except for being hungry ? ?Objective: ?Vital signs in last 24 hours: ?Temp:  [97.4 ?F (36.3 ?C)-98.2 ?F (36.8 ?C)] 97.8 ?F (36.6 ?C) (05/04 0900) ?Pulse Rate:  [60-94] 64 (05/04 0900) ?Resp:  [13-17] 17 (05/04 0900) ?BP: (82-121)/(49-98) 121/98 (05/04 0900) ?SpO2:  [90 %-97 %] 91 % (05/04 0900) ?Last BM Date :  (PTA) ? ?Intake/Output from previous day: ?05/03 0701 - 05/04 0700 ?In: 1444.3 [P.O.:720; I.V.:724.3] ?Out: 830 [Urine:800; Chest Tube:30] ?Intake/Output this shift: ?No intake/output data recorded. ? ?PE: ?General: pleasant, WD, male who is sitting up in bed in NAD ?Heart: regular, rate, and rhythm  ?Lungs: CTAB.  Respiratory effort nonlabored on room air. CT in place with bandage c/d/I. To WS. No air leak. SS fluid in cannister ?Abd: soft, NT, ND ?MSK: all 4 extremities are symmetrical with no cyanosis, clubbing, or edema. ?Skin: warm and dry with no masses, lesions, or rashes ? ? ?Lab Results:  ?Recent Labs  ?  01/09/22 ?0503 01/10/22 ?0244  ?WBC 9.8 8.7  ?HGB 12.5* 13.2  ?HCT 39.4 40.9  ?PLT 290 281  ? ? ?BMET ?Recent Labs  ?  01/09/22 ?0503 01/10/22 ?0244  ?NA 142 143  ?K 3.3* 3.9  ?CL 107 109  ?CO2 27 25  ?GLUCOSE 98 96  ?BUN 13 19  ?CREATININE 1.17 1.26*  ?CALCIUM 8.8* 9.0  ? ? ?PT/INR ?No results for input(s): LABPROT, INR in the last 72 hours. ?CMP  ?   ?Component Value Date/Time  ? NA 143 01/10/2022 0244  ? K 3.9 01/10/2022 0244  ? CL 109 01/10/2022 0244  ? CO2 25 01/10/2022 0244  ? GLUCOSE 96 01/10/2022 0244  ? BUN 19 01/10/2022 0244  ? CREATININE 1.26 (H) 01/10/2022 0244  ? CALCIUM 9.0 01/10/2022 0244  ? PROT 6.9 01/10/2022 0244  ? ALBUMIN 3.3 (L) 01/10/2022 0244  ? AST 25 01/10/2022 0244  ? ALT 26 01/10/2022 0244  ? ALKPHOS 111 01/10/2022 0244  ? BILITOT 1.4 (H)  01/10/2022 0244  ? GFRNONAA 54 (L) 01/10/2022 0244  ? GFRAA >60 11/06/2015 0933  ? ?Lipase  ?   ?Component Value Date/Time  ? LIPASE 36 12/14/2021 1443  ? ? ? ? ? ?Studies/Results: ?DG CHEST PORT 1 VIEW ? ?Result Date: 01/11/2022 ?CLINICAL DATA:  Pneumothorax. EXAM: PORTABLE CHEST 1 VIEW COMPARISON:  Jan 10, 2022. FINDINGS: Stable cardiomegaly. Left-sided chest tube is unchanged in position. No pneumothorax is noted. Right lung is clear. Stable left basilar opacity is noted which may represent atelectasis, infiltrate or possible hiatal hernia. Bony thorax is unremarkable. IMPRESSION: Stable position of left-sided chest tube without pneumothorax. Stable left basilar opacity as described above. Electronically Signed   By: Marijo Conception M.D.   On: 01/11/2022 08:02  ? ?DG CHEST PORT 1 VIEW ? ?Result Date: 01/10/2022 ?CLINICAL DATA:  Pneumothorax EXAM: PORTABLE CHEST 1 VIEW COMPARISON:  Chest radiograph 01/09/2022 FINDINGS: The patient is significantly rotated to the right. The pigtail catheter coiled in the left apex is unchanged. The cardiomediastinal silhouette is grossly stable allowing for rightward patient rotation. Residual left pleural fluid is similar to the prior study. There is confluent opacity in the left lower lobe which is increased in conspicuity compared to the prior study, likely at least in part due  to differences in patient rotation. There is no appreciable pneumothorax. Aeration of the right lung is unchanged, with no new or worsening focal airspace disease. There is a probable hiatal hernia. There is no acute osseous abnormality. IMPRESSION: 1. Stable left-sided pigtail catheter with no appreciable pneumothorax. Residual left pleural fluid is not significantly changed. 2. Confluent opacity in the left lower lobe is increased in conspicuity compared to the prior study, likely at least in part due to patient positioning. This may reflect a combination of airspace disease and/or hiatal hernia.  Electronically Signed   By: Valetta Mole M.D.   On: 01/10/2022 08:39   ? ?Anti-infectives: ?Anti-infectives (From admission, onward)  ? ? Start     Dose/Rate Route Frequency Ordered Stop  ? 01/09/22 0330  demeclocycline (DECLOMYCIN) tablet 150 mg       ?Note to Pharmacy: Patient taking differently: Continuously    ? 150 mg Oral 2 times daily 01/09/22 0329    ? ?  ? ? ? ?Assessment/Plan ?GLF at SNF ?L 6-9 rib fx with L PTX - recurrent PTX and effusion on xray 5/1 s/p CT placement in ED 5/1. Am cxr 5/4 with stable effusion and 30cc/24h output recorded with another 50-60cc in tubing this am. Continue CT to [facility] today. Recommend aggressive pulmonary toilet and IS ?New onset seizures - diagnosed last admission and neuro consulted, MR brain showed possible stroke. On lamictal ?A. Fib with RVR - per TRH. Previously on eliquis. Now held ?H/o SIADH - per TRH ?CKD stage IIIa - per TRH, stable ?Delirium - per TRH, prn haldol  ?  ?FEN: HH ?VTE: SCDs, SQH ?ID: no current abx ? ?I reviewed hospitalist notes, last 24 h vitals and pain scores, last 48 h intake and output, last 24 h labs and trends, and last 24 h imaging results. ? ? LOS: 2 days  ? ?Winferd Humphrey, PA-C ?[facility] ?01/11/2022, 9:46 AM ?Please see Amion for pager number during day hours 7:00am-4:30pm ? ?

## 2022-01-12 ENCOUNTER — Inpatient Hospital Stay (HOSPITAL_COMMUNITY): Payer: Medicare Other

## 2022-01-12 DIAGNOSIS — Z91030 Bee allergy status: Secondary | ICD-10-CM | POA: Diagnosis not present

## 2022-01-12 DIAGNOSIS — R451 Restlessness and agitation: Secondary | ICD-10-CM | POA: Diagnosis not present

## 2022-01-12 DIAGNOSIS — Q2730 Arteriovenous malformation, site unspecified: Secondary | ICD-10-CM | POA: Diagnosis not present

## 2022-01-12 DIAGNOSIS — W19XXXA Unspecified fall, initial encounter: Secondary | ICD-10-CM | POA: Diagnosis not present

## 2022-01-12 DIAGNOSIS — F410 Panic disorder [episodic paroxysmal anxiety] without agoraphobia: Secondary | ICD-10-CM | POA: Diagnosis not present

## 2022-01-12 DIAGNOSIS — F0394 Unspecified dementia, unspecified severity, with anxiety: Secondary | ICD-10-CM | POA: Diagnosis present

## 2022-01-12 DIAGNOSIS — E871 Hypo-osmolality and hyponatremia: Secondary | ICD-10-CM | POA: Diagnosis not present

## 2022-01-12 DIAGNOSIS — R269 Unspecified abnormalities of gait and mobility: Secondary | ICD-10-CM | POA: Diagnosis not present

## 2022-01-12 DIAGNOSIS — R410 Disorientation, unspecified: Secondary | ICD-10-CM | POA: Diagnosis not present

## 2022-01-12 DIAGNOSIS — B379 Candidiasis, unspecified: Secondary | ICD-10-CM | POA: Diagnosis not present

## 2022-01-12 DIAGNOSIS — R001 Bradycardia, unspecified: Secondary | ICD-10-CM | POA: Diagnosis not present

## 2022-01-12 DIAGNOSIS — J95811 Postprocedural pneumothorax: Secondary | ICD-10-CM | POA: Diagnosis not present

## 2022-01-12 DIAGNOSIS — M6281 Muscle weakness (generalized): Secondary | ICD-10-CM | POA: Diagnosis not present

## 2022-01-12 DIAGNOSIS — G40909 Epilepsy, unspecified, not intractable, without status epilepticus: Secondary | ICD-10-CM | POA: Diagnosis not present

## 2022-01-12 DIAGNOSIS — M4856XA Collapsed vertebra, not elsewhere classified, lumbar region, initial encounter for fracture: Secondary | ICD-10-CM | POA: Diagnosis not present

## 2022-01-12 DIAGNOSIS — M4854XA Collapsed vertebra, not elsewhere classified, thoracic region, initial encounter for fracture: Secondary | ICD-10-CM | POA: Diagnosis not present

## 2022-01-12 DIAGNOSIS — I63421 Cerebral infarction due to embolism of right anterior cerebral artery: Secondary | ICD-10-CM | POA: Diagnosis not present

## 2022-01-12 DIAGNOSIS — S20222A Contusion of left back wall of thorax, initial encounter: Secondary | ICD-10-CM | POA: Diagnosis not present

## 2022-01-12 DIAGNOSIS — E222 Syndrome of inappropriate secretion of antidiuretic hormone: Secondary | ICD-10-CM | POA: Diagnosis present

## 2022-01-12 DIAGNOSIS — F331 Major depressive disorder, recurrent, moderate: Secondary | ICD-10-CM | POA: Diagnosis not present

## 2022-01-12 DIAGNOSIS — M1611 Unilateral primary osteoarthritis, right hip: Secondary | ICD-10-CM | POA: Diagnosis not present

## 2022-01-12 DIAGNOSIS — R2689 Other abnormalities of gait and mobility: Secondary | ICD-10-CM | POA: Diagnosis not present

## 2022-01-12 DIAGNOSIS — Z4682 Encounter for fitting and adjustment of non-vascular catheter: Secondary | ICD-10-CM | POA: Diagnosis not present

## 2022-01-12 DIAGNOSIS — R413 Other amnesia: Secondary | ICD-10-CM | POA: Diagnosis not present

## 2022-01-12 DIAGNOSIS — F02B4 Dementia in other diseases classified elsewhere, moderate, with anxiety: Secondary | ICD-10-CM | POA: Diagnosis not present

## 2022-01-12 DIAGNOSIS — I6782 Cerebral ischemia: Secondary | ICD-10-CM | POA: Diagnosis not present

## 2022-01-12 DIAGNOSIS — J159 Unspecified bacterial pneumonia: Secondary | ICD-10-CM | POA: Diagnosis not present

## 2022-01-12 DIAGNOSIS — Z0000 Encounter for general adult medical examination without abnormal findings: Secondary | ICD-10-CM | POA: Diagnosis not present

## 2022-01-12 DIAGNOSIS — R52 Pain, unspecified: Secondary | ICD-10-CM | POA: Diagnosis not present

## 2022-01-12 DIAGNOSIS — G319 Degenerative disease of nervous system, unspecified: Secondary | ICD-10-CM | POA: Diagnosis not present

## 2022-01-12 DIAGNOSIS — Z79899 Other long term (current) drug therapy: Secondary | ICD-10-CM | POA: Diagnosis not present

## 2022-01-12 DIAGNOSIS — I619 Nontraumatic intracerebral hemorrhage, unspecified: Secondary | ICD-10-CM | POA: Diagnosis not present

## 2022-01-12 DIAGNOSIS — K219 Gastro-esophageal reflux disease without esophagitis: Secondary | ICD-10-CM | POA: Diagnosis present

## 2022-01-12 DIAGNOSIS — I639 Cerebral infarction, unspecified: Secondary | ICD-10-CM | POA: Diagnosis not present

## 2022-01-12 DIAGNOSIS — I770 Arteriovenous fistula, acquired: Secondary | ICD-10-CM | POA: Diagnosis present

## 2022-01-12 DIAGNOSIS — I4891 Unspecified atrial fibrillation: Secondary | ICD-10-CM | POA: Diagnosis present

## 2022-01-12 DIAGNOSIS — R531 Weakness: Secondary | ICD-10-CM | POA: Diagnosis not present

## 2022-01-12 DIAGNOSIS — R9431 Abnormal electrocardiogram [ECG] [EKG]: Secondary | ICD-10-CM | POA: Diagnosis not present

## 2022-01-12 DIAGNOSIS — N1831 Chronic kidney disease, stage 3a: Secondary | ICD-10-CM | POA: Diagnosis not present

## 2022-01-12 DIAGNOSIS — R29898 Other symptoms and signs involving the musculoskeletal system: Secondary | ICD-10-CM | POA: Diagnosis not present

## 2022-01-12 DIAGNOSIS — S2242XA Multiple fractures of ribs, left side, initial encounter for closed fracture: Secondary | ICD-10-CM | POA: Diagnosis not present

## 2022-01-12 DIAGNOSIS — R296 Repeated falls: Secondary | ICD-10-CM | POA: Diagnosis not present

## 2022-01-12 DIAGNOSIS — J180 Bronchopneumonia, unspecified organism: Secondary | ICD-10-CM | POA: Diagnosis not present

## 2022-01-12 DIAGNOSIS — E785 Hyperlipidemia, unspecified: Secondary | ICD-10-CM | POA: Diagnosis not present

## 2022-01-12 DIAGNOSIS — M2550 Pain in unspecified joint: Secondary | ICD-10-CM | POA: Diagnosis not present

## 2022-01-12 DIAGNOSIS — R41841 Cognitive communication deficit: Secondary | ICD-10-CM | POA: Diagnosis not present

## 2022-01-12 DIAGNOSIS — I700 Atherosclerosis of aorta: Secondary | ICD-10-CM | POA: Diagnosis not present

## 2022-01-12 DIAGNOSIS — R2681 Unsteadiness on feet: Secondary | ICD-10-CM | POA: Diagnosis not present

## 2022-01-12 DIAGNOSIS — I959 Hypotension, unspecified: Secondary | ICD-10-CM | POA: Diagnosis present

## 2022-01-12 DIAGNOSIS — F064 Anxiety disorder due to known physiological condition: Secondary | ICD-10-CM | POA: Diagnosis not present

## 2022-01-12 DIAGNOSIS — F03C18 Unspecified dementia, severe, with other behavioral disturbance: Secondary | ICD-10-CM | POA: Diagnosis not present

## 2022-01-12 DIAGNOSIS — R251 Tremor, unspecified: Secondary | ICD-10-CM | POA: Diagnosis not present

## 2022-01-12 DIAGNOSIS — R300 Dysuria: Secondary | ICD-10-CM | POA: Diagnosis not present

## 2022-01-12 DIAGNOSIS — M4000 Postural kyphosis, site unspecified: Secondary | ICD-10-CM | POA: Diagnosis not present

## 2022-01-12 DIAGNOSIS — R262 Difficulty in walking, not elsewhere classified: Secondary | ICD-10-CM | POA: Diagnosis not present

## 2022-01-12 DIAGNOSIS — Z8673 Personal history of transient ischemic attack (TIA), and cerebral infarction without residual deficits: Secondary | ICD-10-CM | POA: Diagnosis not present

## 2022-01-12 DIAGNOSIS — F03C4 Unspecified dementia, severe, with anxiety: Secondary | ICD-10-CM | POA: Diagnosis not present

## 2022-01-12 DIAGNOSIS — J189 Pneumonia, unspecified organism: Secondary | ICD-10-CM | POA: Diagnosis not present

## 2022-01-12 DIAGNOSIS — G301 Alzheimer's disease with late onset: Secondary | ICD-10-CM | POA: Diagnosis not present

## 2022-01-12 DIAGNOSIS — Z23 Encounter for immunization: Secondary | ICD-10-CM | POA: Diagnosis not present

## 2022-01-12 DIAGNOSIS — F429 Obsessive-compulsive disorder, unspecified: Secondary | ICD-10-CM | POA: Diagnosis present

## 2022-01-12 DIAGNOSIS — R112 Nausea with vomiting, unspecified: Secondary | ICD-10-CM | POA: Diagnosis not present

## 2022-01-12 DIAGNOSIS — R259 Unspecified abnormal involuntary movements: Secondary | ICD-10-CM | POA: Diagnosis not present

## 2022-01-12 DIAGNOSIS — F0390 Unspecified dementia without behavioral disturbance: Secondary | ICD-10-CM | POA: Diagnosis not present

## 2022-01-12 DIAGNOSIS — K5900 Constipation, unspecified: Secondary | ICD-10-CM | POA: Diagnosis not present

## 2022-01-12 DIAGNOSIS — W010XXA Fall on same level from slipping, tripping and stumbling without subsequent striking against object, initial encounter: Secondary | ICD-10-CM | POA: Diagnosis not present

## 2022-01-12 DIAGNOSIS — R4182 Altered mental status, unspecified: Secondary | ICD-10-CM | POA: Diagnosis not present

## 2022-01-12 DIAGNOSIS — D649 Anemia, unspecified: Secondary | ICD-10-CM | POA: Diagnosis not present

## 2022-01-12 DIAGNOSIS — Z7689 Persons encountering health services in other specified circumstances: Secondary | ICD-10-CM | POA: Diagnosis not present

## 2022-01-12 DIAGNOSIS — Z7401 Bed confinement status: Secondary | ICD-10-CM | POA: Diagnosis not present

## 2022-01-12 DIAGNOSIS — Z9181 History of falling: Secondary | ICD-10-CM | POA: Diagnosis not present

## 2022-01-12 DIAGNOSIS — G4700 Insomnia, unspecified: Secondary | ICD-10-CM | POA: Diagnosis not present

## 2022-01-12 DIAGNOSIS — F02C4 Dementia in other diseases classified elsewhere, severe, with anxiety: Secondary | ICD-10-CM | POA: Diagnosis not present

## 2022-01-12 DIAGNOSIS — R209 Unspecified disturbances of skin sensation: Secondary | ICD-10-CM | POA: Diagnosis not present

## 2022-01-12 DIAGNOSIS — R569 Unspecified convulsions: Secondary | ICD-10-CM | POA: Diagnosis not present

## 2022-01-12 DIAGNOSIS — S270XXA Traumatic pneumothorax, initial encounter: Secondary | ICD-10-CM | POA: Diagnosis not present

## 2022-01-12 DIAGNOSIS — M150 Primary generalized (osteo)arthritis: Secondary | ICD-10-CM | POA: Diagnosis not present

## 2022-01-12 DIAGNOSIS — S270XXD Traumatic pneumothorax, subsequent encounter: Secondary | ICD-10-CM | POA: Diagnosis not present

## 2022-01-12 DIAGNOSIS — I609 Nontraumatic subarachnoid hemorrhage, unspecified: Secondary | ICD-10-CM | POA: Diagnosis present

## 2022-01-12 DIAGNOSIS — J3489 Other specified disorders of nose and nasal sinuses: Secondary | ICD-10-CM | POA: Diagnosis not present

## 2022-01-12 DIAGNOSIS — E860 Dehydration: Secondary | ICD-10-CM | POA: Diagnosis not present

## 2022-01-12 DIAGNOSIS — N182 Chronic kidney disease, stage 2 (mild): Secondary | ICD-10-CM | POA: Diagnosis not present

## 2022-01-12 DIAGNOSIS — F330 Major depressive disorder, recurrent, mild: Secondary | ICD-10-CM | POA: Diagnosis not present

## 2022-01-12 DIAGNOSIS — F419 Anxiety disorder, unspecified: Secondary | ICD-10-CM | POA: Diagnosis not present

## 2022-01-12 DIAGNOSIS — Z886 Allergy status to analgesic agent status: Secondary | ICD-10-CM | POA: Diagnosis not present

## 2022-01-12 DIAGNOSIS — I618 Other nontraumatic intracerebral hemorrhage: Secondary | ICD-10-CM | POA: Diagnosis not present

## 2022-01-12 DIAGNOSIS — R42 Dizziness and giddiness: Secondary | ICD-10-CM | POA: Diagnosis not present

## 2022-01-12 MED ORDER — CLONAZEPAM 0.5 MG PO TABS
0.5000 mg | ORAL_TABLET | Freq: Two times a day (BID) | ORAL | Status: DC
  Administered 2022-01-12: 0.5 mg via ORAL
  Filled 2022-01-12: qty 1

## 2022-01-12 MED ORDER — HALOPERIDOL LACTATE 5 MG/ML IJ SOLN
2.0000 mg | Freq: Once | INTRAMUSCULAR | Status: DC
  Filled 2022-01-12: qty 1

## 2022-01-12 MED ORDER — BISACODYL 10 MG RE SUPP
10.0000 mg | Freq: Once | RECTAL | Status: AC
  Administered 2022-01-12: 10 mg via RECTAL
  Filled 2022-01-12: qty 1

## 2022-01-12 MED ORDER — METOPROLOL TARTRATE 25 MG PO TABS
25.0000 mg | ORAL_TABLET | Freq: Two times a day (BID) | ORAL | 2 refills | Status: AC
Start: 2022-01-12 — End: 2022-09-04

## 2022-01-12 NOTE — Progress Notes (Signed)
Central telemetry notified this RN that patient pulled out tele.  On arriving at bedside this RN found telemetry on the floor.  Patient started using profanity stating that RN should cut it out.  This RN explained to patient that it is needed to monitor his heart but he insisted that it be cut off.  Tele was removed and patient repositioned in bed.  Shortly after that my new patient arrived and while I was admitting the new patient, another RN called for help in room 33.  I rushed in to find patient on bedside commode with chest tube out.  Attempted to get patient back in bed and he refused saying that he needed to have BM. He had a small ball of hard stool and he reported that it is stuck uu in his butt and needed help to get it out.  MD notified and at bedside.  New order chest x-ray and suppository received. ?

## 2022-01-12 NOTE — [place] FL2 (Addendum)
?[place] MEDICAID FL2 LEVEL OF CARE SCREENING TOOL  ?  ? ?IDENTIFICATION  ?Patient Name: ?Adam Swanson. Birthdate: 12/29/1927 Sex: male Admission Date (Current Location): ?01/08/2022  ?[place] and [place] Number: ? [place] ?  Facility and Address:  ?[facility] [place] [address] [place], [place] [address] ?     Provider Number: ?[id number]  ?Attending Physician Name and Address:  ?Deatra James, MD ? Relative Name and Phone Number:  ?  ?   ?Current Level of Care: ?Hospital Recommended Level of Care: ?[place] Prior Approval Number: ?  ? ?Date Approved/Denied: ?  PASRR Number: ?[id number] A ? ?Discharge Plan: ?SNF ?  ? ?Current Diagnoses: ?Patient Active Problem List  ? Diagnosis Date Noted  ? 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  ? Recurrent pneumothorax after chest tube removed 01/08/2022  ? Pneumothorax, traumatic   ? Stroke [facility]) 12/30/2021  ? Goals of care, counseling/discussion   ? Delirium 12/28/2021  ? Anemia in chronic kidney disease (CKD) 12/27/2021  ? Dementia without behavioral disturbance ([place]) 12/27/2021  ? Hypokalemia 12/27/2021  ? A-fib ([place]) 12/26/2021  ? Traumatic fracture of ribs with pneumothorax, left, sequela 12/26/2021  ? Seizure ([place]) 12/26/2021  ? Anxiety   ? Chronic kidney disease, stage 3a ([place])   ? Gait abnormality 01/30/2017  ? Weakness 01/28/2017  ? Low back pain without sciatica 01/28/2017  ? Bronchopneumonia 09/24/2015  ? Hyponatremia 09/24/2015  ? Abnormal ECG 09/24/2015  ? HCAP (healthcare-associated pneumonia) 09/24/2015  ? Heart murmur 09/24/2015  ? 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 Benign neoplasm of colon 04/09/2011  ? Unspecified constipation 04/09/2011  ? Constipation 01/18/2011  ? Change in bowel function 01/18/2011  ? 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 General symptom  01/18/2011  ? Oropharyngeal dysphagia 01/18/2011  ? DIVERTICULOSIS, COLON 05/14/2007  ? HIATAL HERNIA 03/19/2003   ? ? ?Orientation RESPIRATION BLADDER Height & Weight   ?  ?Self ? Normal Continent, External catheter Weight:   ?Height:     ?BEHAVIORAL SYMPTOMS/MOOD NEUROLOGICAL BOWEL NUTRITION STATUS  ?    Continent Diet (See DC Summary)  ?AMBULATORY STATUS COMMUNICATION OF NEEDS Skin   ?Extensive Assist Verbally Normal ?  ?  ?  ?    ?     ?     ? ? ?Personal Care Assistance Level of Assistance  ?Bathing, Feeding, Dressing Bathing Assistance: Maximum assistance ?Feeding assistance: Limited assistance ?Dressing Assistance: Limited assistance ?   ? ?Functional Limitations Info  ?Sight, Hearing Sight Info: Impaired ?Hearing Info: Impaired ?   ? ? ?SPECIAL CARE FACTORS FREQUENCY  ?PT (By licensed PT), OT (By licensed OT)   ?  ?PT Frequency: 5x/week ?OT Frequency: 5x/week ?  ?  ?  ?   ? ? ?Contractures Contractures Info: Not present  ? ? ?Additional Factors Info  ?Code Status, Allergies, Psychotropic Code Status Info: Full ?Allergies Info: Bee Venom, Nsaids, Other ?Psychotropic Info: Ativan, seroquel ?  ?  ?   ? ?Current Medications (01/12/2022):  This is the current hospital active medication list ?Current Facility-Administered Medications  ?Medication Dose Route Frequency Provider Last Rate Last Admin  ? acetaminophen (TYLENOL) tablet 650 mg  650 mg Oral Q6H PRN Etta Quill, DO      ? clonazePAM Bobbye [place]) tablet 0.5 mg  0.5 mg Oral BID Skipper Cliche A, MD   0.5 mg at 01/12/22 1128  ? demeclocycline (DECLOMYCIN) tablet 150 mg  150 mg Oral BID Jennette Kettle  M, DO   150 mg at 01/12/22 1131  ? enoxaparin (LOVENOX) injection 40 mg  40 mg Subcutaneous Q24H Jennette Kettle M, DO   40 mg at 01/12/22 1129  ? furosemide (LASIX) tablet 40 mg  40 mg Oral Daily Shahmehdi, Seyed A, MD   40 mg at 01/12/22 1129  ? haloperidol lactate (HALDOL) injection 2 mg  2 mg Intravenous Once Shahmehdi, Seyed A, MD      ? HYDROcodone-acetaminophen (NORCO/VICODIN) 5-325 MG per tablet 1 tablet  1 tablet Oral Q6H PRN Etta Quill, DO      ?  lamoTRIgine (LAMICTAL) tablet 25 mg  25 mg Oral QHS Jennette Kettle M, DO   25 mg at 01/11/22 2138  ? metoprolol tartrate (LOPRESSOR) tablet 25 mg  25 mg Oral BID Domenic Polite, MD   25 mg at 01/12/22 1129  ? ondansetron (ZOFRAN) tablet 4 mg  4 mg Oral Q6H PRN Etta Quill, DO      ? Or  ? ondansetron (ZOFRAN) injection 4 mg  4 mg Intravenous Q6H PRN Etta Quill, DO   4 mg at 01/10/22 1018  ? pantoprazole (PROTONIX) EC tablet 40 mg  40 mg Oral Daily Jennette Kettle M, DO   40 mg at 01/12/22 1129  ? QUEtiapine (SEROQUEL) tablet 25 mg  25 mg Oral QHS Jennette Kettle M, DO   25 mg at 01/11/22 2138  ? rosuvastatin (CRESTOR) tablet 5 mg  5 mg Oral QHS Jennette Kettle M, DO   5 mg at 01/11/22 2138  ? sodium chloride tablet 4 g  4 g Oral Daily Jennette Kettle M, DO   4 g at 01/12/22 1128  ? tamsulosin (FLOMAX) capsule 0.4 mg  0.4 mg Oral QHS Jennette Kettle M, DO   0.4 mg at 01/11/22 2138  ? ? ? ?Discharge Medications: ?Please see discharge summary for a list of discharge medications. ? ?Relevant Imaging Results: ? ?Relevant Lab Results: ? ? ?Additional Information ?SSN: [id number]. Add palliative care at [facility].  ? ?Benard Halsted, LCSW ? ? ? ? ?

## 2022-01-12 NOTE — Progress Notes (Signed)
Pt sleeping at this time, did not want to be bothered. No IV medications due. RN made aware. ?

## 2022-01-12 NOTE — Progress Notes (Signed)
Palliative- ? ?Consult received- noted patient is planned for discharge back to facility today.  ?Recommend outpatient Palliative followup at SNF.  ? ?Mariana Kaufman, AGNP-C ?Palliative Medicine ? ?No charge ?

## 2022-01-12 NOTE — Care Management Important Message (Signed)
Important Message ? ?Patient Details  ?Name: Adam Swanson. ?MRN: [id number] ?Date of Birth: 06/24/1928 ? ? ?Medicare Important Message Given:  Yes ? ? ? ? ?Yoceline Bazar ?01/12/2022, 4:33 PM ?

## 2022-01-12 NOTE — Progress Notes (Signed)
? ?Progress Note ? ?   ?Subjective: ?Very agitated this morning. CT pulled out overnight.  Patient cussing at me and demanded I get out of his room and will not allow me to examine him. ? ?Objective: ?Vital signs in last 24 hours: ?Temp:  [97.6 ?F (36.4 ?C)-97.8 ?F (36.6 ?C)] 97.6 ?F (36.4 ?C) (05/05 0200) ?Pulse Rate:  [57-67] 60 (05/05 0200) ?Resp:  [14-19] 15 (05/05 0200) ?BP: (96-121)/(74-98) 99/74 (05/05 0200) ?SpO2:  [90 %-99 %] 99 % (05/05 0200) ?Last BM Date : 01/11/22 ? ?Intake/Output from previous day: ?05/04 0701 - 05/05 0700 ?In: 1475.3 [P.O.:1080; I.V.:395.3] ?Out: -  ?Intake/Output this shift: ?No intake/output data recorded. ? ?PE: ?General: angry and agitated ?Unable to perform any other part of exam as he adamantly refuses for me to exam him or really even speak to him. ? ? ?Lab Results:  ?Recent Labs  ?  01/10/22 ?0244  ?WBC 8.7  ?HGB 13.2  ?HCT 40.9  ?PLT 281  ? ?BMET ?Recent Labs  ?  01/10/22 ?0244  ?NA 143  ?K 3.9  ?CL 109  ?CO2 25  ?GLUCOSE 96  ?BUN 19  ?CREATININE 1.26*  ?CALCIUM 9.0  ? ?PT/INR ?No results for input(s): LABPROT, INR in the last 72 hours. ?CMP  ?   ?Component Value Date/Time  ? NA 143 01/10/2022 0244  ? K 3.9 01/10/2022 0244  ? CL 109 01/10/2022 0244  ? CO2 25 01/10/2022 0244  ? GLUCOSE 96 01/10/2022 0244  ? BUN 19 01/10/2022 0244  ? CREATININE 1.26 (H) 01/10/2022 0244  ? CALCIUM 9.0 01/10/2022 0244  ? PROT 6.9 01/10/2022 0244  ? ALBUMIN 3.3 (L) 01/10/2022 0244  ? AST 25 01/10/2022 0244  ? ALT 26 01/10/2022 0244  ? ALKPHOS 111 01/10/2022 0244  ? BILITOT 1.4 (H) 01/10/2022 0244  ? GFRNONAA 54 (L) 01/10/2022 0244  ? GFRAA >60 11/06/2015 0933  ? ?Lipase  ?   ?Component Value Date/Time  ? LIPASE 36 12/14/2021 1443  ? ? ? ? ? ?Studies/Results: ?DG Chest 1 View ? ?Result Date: 01/12/2022 ?CLINICAL DATA:  Recent chest tube removal EXAM: PORTABLE CHEST 1 VIEW COMPARISON:  01/11/2022 FINDINGS: Cardiac shadow is within normal limits. Aortic calcifications are noted. The lungs are well  aerated bilaterally. Pigtail catheter has been removed on the left. No pneumothorax is seen. No bony abnormality is noted. IMPRESSION: No pneumothorax following chest tube removal on the left. Electronically Signed   By: Inez Catalina M.D.   On: 01/12/2022 02:07  ? ?DG CHEST PORT 1 VIEW ? ?Result Date: 01/11/2022 ?CLINICAL DATA:  Pneumothorax. EXAM: PORTABLE CHEST 1 VIEW COMPARISON:  Jan 10, 2022. FINDINGS: Stable cardiomegaly. Left-sided chest tube is unchanged in position. No pneumothorax is noted. Right lung is clear. Stable left basilar opacity is noted which may represent atelectasis, infiltrate or possible hiatal hernia. Bony thorax is unremarkable. IMPRESSION: Stable position of left-sided chest tube without pneumothorax. Stable left basilar opacity as described above. Electronically Signed   By: Marijo Conception M.D.   On: 01/11/2022 08:02   ? ?Anti-infectives: ?Anti-infectives (From admission, onward)  ? ? Start     Dose/Rate Route Frequency Ordered Stop  ? 01/09/22 0330  demeclocycline (DECLOMYCIN) tablet 150 mg       ?Note to Pharmacy: Patient taking differently: Continuously    ? 150 mg Oral 2 times daily 01/09/22 0329    ? ?  ? ? ? ?Assessment/Plan ?GLF at SNF ?L 6-9 rib fx with L PTX -  recurrent PTX and effusion on xray 5/1 s/p CT placement in ED 5/1. Pulled CT out overnight.  Immediate repeat CXR with no PTX. Ideally he needs a CXR at least 4 hrs after removal to assure stability.  I will order this but the patient cussed me and told me he would not participate in that.  I think he will likely refuse.  At this point, beyond this film (if he allows it) as long as he is asymptomatic, at his age, etc, I would defer any further chest x-rays being done unless he becomes symptomatic. ?New onset seizures - diagnosed last admission and neuro consulted, MR brain showed possible stroke. On lamictal ?A. Fib with RVR - per TRH. Previously on eliquis. Now held ?H/o SIADH - per TRH ?CKD stage IIIa - per TRH,  stable ?Delirium - per TRH, prn haldol  ?  ?FEN: HH ?VTE: SCDs, SQH ?ID: no current abx ? ?I reviewed hospitalist notes, last 24 h vitals and pain scores, last 48 h intake and output, last 24 h labs and trends, and last 24 h imaging results. ? ? LOS: 3 days  ? ?Henreitta Cea, PA-C ?[facility] ?01/12/2022, 8:34 AM ?Please see Amion for pager number during day hours 7:00am-4:30pm ? ?

## 2022-01-12 NOTE — Progress Notes (Signed)
Notified Dr. Grandville Silos of removal of chest tube by patient.  He advice RN to watch patient carefully.  Patient is in bed resting and in no acute distress.  Vitals signs are stable and O2 sat is 100% on room air.  Will continue to monitor. ?

## 2022-01-12 NOTE — TOC Transition Note (Signed)
Transition of Care (TOC) - CM/SW Discharge Note ? ? ?Patient Details  ?Name: Adam Swanson. ?MRN: [id number] ?Date of Birth: 1928/04/21 ? ?Transition of Care (TOC) CM/SW Contact:  ?Benard Halsted, LCSW ?Phone Number: ?01/12/2022, 1:35 PM ? ? ?Clinical Narrative:    ?Patient will DC to: [facility] ?Anticipated DC date: 01/12/22 ?Family notified: Son, [name] ?Transport by: Corey Harold ? ? ?Per MD patient ready for DC to [facility]. RN to call report prior to discharge ([phone]). RN, patient, patient's family, and facility notified of DC. Discharge Summary and FL2 sent to facility. DC packet on chart. Ambulance transport requested for patient.  ? ?CSW will sign off for now as social work intervention is no longer needed. Please consult [place] again if new needs arise. ? ? ? ? ?Final next level of care: [place] ?Barriers to Discharge: No Barriers Identified ? ? ?Patient Goals and CMS Choice ?Patient states their goals for this hospitalization and ongoing recovery are:: Return to snf ?CMS Medicare.gov Compare Post Acute Care list provided to:: Patient Represenative (must comment) ?Choice offered to / list presented to : Adult Children ? ?Discharge Placement ?  ?Existing PASRR number confirmed : 01/12/22          ?Patient chooses bed at: [facility] ?Patient to be transferred to facility by: [facility] ?Name of family member notified: Son ?Patient and family notified of of transfer: 01/12/22 ? ?Discharge Plan and Services ?In-house Referral: Clinical Social Work ?  ?Post Acute Care Choice: [place] [facility]          ?  ?  ?  ?  ?  ?  ?  ?  ?  ?  ? ?Social Determinants of Health (SDOH) Interventions ?  ? ? ?Readmission Risk Interventions ?   ? View : No data to display.  ?  ?  ?  ? ? ? ? ? ?

## 2022-01-12 NOTE — Progress Notes (Signed)
Physical Therapy Treatment ?Patient Details ?Name: Adam Swanson. ?MRN: [id number] ?DOB: 1927-11-13 ?Today's Date: 01/12/2022 ? ? ?History of Present Illness The pt is a 88 yo male presenting 5/1 with L pneumothorax and L pleural effusion after fall on 4/18 (pt had been admitted 4/18 and d/c to [facility] 4/25). Chest tube placed 5/2, pt developed rapid afib. Pt removed chest tube on 5/4; was not reinserted. PMH includes: dementia, fall 4/18 with L rib fx 6-9, CKD III, GERD, and anxiety. ? ?  ?PT Comments  ? ? Pt is reluctant to work with PT initially, but is agreeable after PT offers to take pt to brush his teeth (a task he is anxious to complete).  Once up with PT, pt is agreeable to do more activity, including amb out to hallway.  Pt is forgetful, but does respond well to VC's to improve safety with use of RW.  Overall, pt demos imrpoved activity tolerance and strength, performing sit > stand with dec assist and amb inc distance without complaint.  Pt remains appropriate for SNF placement to continue to work on safety awareness and functional mobility.   ?Recommendations for follow up therapy are one component of a multi-disciplinary discharge planning process, led by the attending physician.  Recommendations may be updated based on patient status, additional functional criteria and insurance authorization. ? ?Follow Up Recommendations ? Skilled nursing-short term rehab (<3 hours/day) ?  ?  ?Assistance Recommended at Discharge Intermittent Supervision/Assistance  ?Patient can return home with the following A little help with walking and/or transfers;A little help with bathing/dressing/bathroom;Assistance with cooking/housework;Assist for transportation;Direct supervision/assist for financial management;Help with stairs or ramp for entrance;Direct supervision/assist for medications management ?  ?Equipment Recommendations ? Rolling walker (2 wheels)  ?  ?Recommendations for Other Services   ? ? ?  ?Precautions /  Restrictions Precautions ?Precautions: Fall  ?  ? ?Mobility ? Bed Mobility ?Overal bed mobility: Needs Assistance ?Bed Mobility: Supine to Sit ?  ?  ?Supine to sit: Min assist ?  ?  ?General bed mobility comments: Pt req's HHA to transfer sup > sitting.  Demos good sitting balance but has difficulty initially scooting foreward req'ing use of chuck pad for assistance.  Once a little closer to EOB, able to scoot the remaining way independently. ?Patient Response: Cooperative ? ?Transfers ?Overall transfer level: Needs assistance ?Equipment used: Rolling walker (2 wheels) ?Transfers: Sit to/from Stand ?Sit to Stand: Min assist ?  ?  ?  ?  ?  ?General transfer comment: Pt completes sit > stand x 2 with min A.  1st attempt pt pulls up on RW, 2nd attempt pt pushes up from bedrail.  Able to transfer into chair at end of tx session with VCs to back all the way up to chair and reach back for handles. ?  ? ?Ambulation/Gait ?Ambulation/Gait assistance: Min assist ?Gait Distance (Feet): 55 Feet ?Assistive device: Rolling walker (2 wheels) ?Gait Pattern/deviations: Shuffle ?  ?  ?  ?General Gait Details: Pt amb 5 ft to sink to brush teeth while standing and then 5 ft back to bed where he takes seated rest break.  Afterwards, pt amb 20 ft out to hall and 25 ft back to chair.  Pt has tendency to push RW too far out in front and attempts to amb with very quick cadence.  Needs frequent VCs to slow down and keep RW close to body. Able to follow VCs provided by PT. ? ? ?Stairs ?  ?  ?  ?  ?  ? ? ?  Wheelchair Mobility ?  ? ?Modified Rankin (Stroke Patients Only) ?  ? ? ?  ?Balance Overall balance assessment: Needs assistance ?  ?  ?  ?  ?Standing balance support: Bilateral upper extremity supported, During functional activity ?Standing balance-Leahy Scale: Fair ?  ?  ?  ?  ?  ?  ?  ?  ?  ?  ?  ?  ?  ? ?  ?Cognition Arousal/Alertness: Awake/alert ?Behavior During Therapy: [facility] for tasks assessed/performed ?Overall Cognitive Status:  Difficult to assess ?Area of Impairment: Memory (Pt forgetful, often asks "what are we doing now?" after he was already given instructions) ?  ?  ?  ?  ?  ?  ?  ?  ?  ?  ?  ?  ?  ?  ?  ?General Comments: Pt is having IV placed when PT arrives.  Before PT can introduce herself, pt states "I'm not doing any therapy today".  Pt does report wanting to get his teeth brushed and is agreeable to amb to sink to brush teeth. ?  ?  ? ?  ?Exercises   ? ?  ?General Comments   ?  ?  ? ?Pertinent Vitals/Pain Pain Assessment ?Pain Assessment: Faces ?Faces Pain Scale: Hurts a little bit ?Pain Location: Pt states "I feel like crap" but does not elaborate ?Pain Intervention(s): Limited activity within patient's tolerance, Monitored during session, Repositioned  ? ? ?Home Living   ?  ?  ?  ?  ?  ?  ?  ?  ?  ?   ?  ?Prior Function    ?  ?  ?   ? ?PT Goals (current goals can now be found in the care plan section) Progress towards PT goals: Progressing toward goals ? ?  ?Frequency ? ? ? Min 3X/week ? ? ? ?  ?PT Plan Current plan remains appropriate  ? ? ?Co-evaluation   ?  ?  ?  ?  ? ?  ?AM-PAC PT "6 Clicks" Mobility   ?Outcome Measure ? Help needed turning from your back to your side while in a flat bed without using bedrails?: None ?Help needed moving from lying on your back to sitting on the side of a flat bed without using bedrails?: A Little ?Help needed moving to and from a bed to a chair (including a wheelchair)?: A Little ?Help needed standing up from a chair using your arms (e.g., wheelchair or bedside chair)?: A Little ?Help needed to walk in hospital room?: A Little ?Help needed climbing 3-5 steps with a railing? : A Lot ?6 Click Score: 18 ? ?  ?End of Session Equipment Utilized During Treatment: Gait belt ?Activity Tolerance: Patient tolerated treatment well ?Patient left: in chair;with call bell/phone within reach ?Nurse Communication: Mobility status ?  ?  ? ? ?Time: 6063-0160 ?PT Time Calculation (min) (ACUTE ONLY): 21  min ? ?Charges:  $Gait Training: 8-22 mins          ?          ? ?Ruthanna Macchia A. Unnamed Zeien, PT, DPT ?Acute Rehabilitation Services ?Office: [phone]  ? ? ?[place] ?01/12/2022, 11:58 AM ? ?

## 2022-01-12 NOTE — Discharge Summary (Signed)
?Physician Discharge Summary ?  ?Patient: Adam Swanson. MRN: [id number] DOB: 1928/04/10  ?Admit date:     01/08/2022  ?Discharge date: 01/12/22  ?Discharge Physician: Valeria Batman Yani Coventry  ? ?PCP: Hulan Fess, MD  ? ?Recommendations at discharge:  ? ?Follow with a PCP within 1 week, ?Repeat chest x-ray if develop symptoms of shortness of breath, hypoxia ?To return to ED immediately if noted for pneumothorax reoccurring ? ?Discharge Diagnoses: ?Principal Problem: ?  Recurrent pneumothorax after chest tube removed ?Active Problems: ?  A-fib [facility]) ?  Traumatic fracture of ribs with pneumothorax, left, sequela ?  Seizure ([place]) ?  Dementia without behavioral disturbance ([place]) ?  History of SIADH ? ?Resolved Problems: ?  * No resolved hospital problems. * ? ?Hospital Course: ?No notes on file ? ?Assessment and Plan: ?* Recurrent pneumothorax after chest tube removed ?Chest tube was placed on admission, was a spontaneously discontinued on 01/12/2022, followed with serial chest x-rays, -pneumothorax has resolved ?-Cleared by CT surgery to be discharged ? ? ?Pt with recurrent PTx, recent admission for same. ?Chest tube re-inserted by trauma surgery ?Defer management to them. ?Suspect non-operative management at this time. ?Will let patient eat. ?Norco PRN pain ordered ?Caution with opiates given recent delirium  ? ?Traumatic fracture of ribs with pneumothorax, left, sequela ?Continue IS ? ?A-fib ([place]) ?Looks like he was supposed to start eliquis on Friday after depakote stopped according to DC summary, but doesn't look like this was done. ?On the other hand, not clear that [facility] is a great idea now that he has rib fractures, recurrent pneumothorax, with effusion, recent trauma secondary to fall with seizure like activity. ? Hold-off on starting eliquis, (due to deliriums, falls, rib fractures, pneumothorax, seizures) ?Discussed with patient signs regarding contraindications of anticoagulation, rate control for now..  And his  risk for stroke and bleeds. ?Recommending further discussion in a later date with PCP once patient stable regarding chronic anticoagulation therapy ?Cont BID metoprolol ?Tele monitor - had A.Fib last admit. ? ?History of SIADH ?Looks like pt has h/o SIADH, looks like endocrine has him on chronic BID demeclocycline and QID salt tabs. ?Continue chronic demeclocycline ?Continue salt tabs ?Sodium 143 today ? ?Dementia without behavioral disturbance ([place]) ?Cont home meds: seroquel. ?Ativan recently tapered off and stopped due to delirium. ? ?Seizure ([place]) ?On Lamictal titrating up slowly. ? ? ? ? ? ?Consultants: CT surgery ?Procedures performed: Chest tube placement and discontinuing chest tube on 01/12/2022 ?Disposition: Skilled nursing facility ?Diet recommendation:  ?Discharge Diet Orders (From admission, onward)  ? ?  Start     Ordered  ? 01/12/22 0000  Diet - low sodium heart healthy       ? 01/12/22 1222  ? ?  ?  ? ?  ? ?Cardiac diet ?DISCHARGE MEDICATION: ?Allergies as of 01/12/2022   ? ?   Reactions  ? Bee Venom Anaphylaxis  ? Other reaction(s): Other (See Comments) ?Loss of consciousness  ? Nsaids   ? Other reaction(s): intol  ? Other Other (See Comments)  ? ?  ? ?  ?Medication List  ?  ? ?STOP taking these medications   ? ?divalproex 125 MG capsule ?Commonly known as: DEPAKOTE SPRINKLE ?  ?esomeprazole 20 MG capsule ?Commonly known as: [place] ?  ? ?  ? ?TAKE these medications   ? ?acetaminophen 500 MG tablet ?Commonly known as: TYLENOL ?Take 1,000 mg by mouth in the morning and at bedtime. ?  ?acetaminophen 325 MG tablet ?Commonly known as: TYLENOL ?  Take 650 mg by mouth every 6 (six) hours as needed for mild pain. ?  ?demeclocycline 150 MG tablet ?Commonly known as: DECLOMYCIN ?Take 1 tablet (150 mg total) by mouth 2 (two) times daily. ?What changed: additional instructions ?  ?EPINEPHrine 0.3 mg/0.3 mL Soaj injection ?Commonly known as: EPI-PEN ?Inject 0.3 mg into the muscle as needed for anaphylaxis. ?   ?famotidine 20 MG tablet ?Commonly known as: PEPCID ?Take 20 mg by mouth every evening. ?  ?furosemide 20 MG tablet ?Commonly known as: LASIX ?TAKE 2 TABLET = 40 MG BY MOUTH ONCE DAILY ?What changed: See the new instructions. ?  ?lamoTRIgine 25 MG tablet ?Commonly known as: LAMICTAL ?Take 25 mg nightly for 6 more days, then take 25 mg twice daily for 1 week, then take 50 mg in AM and 25 mg in PM for 1 week then continue taper up as outlined in discharge summary.  25 mg increase per week over 8 weeks with goal 100 BID ?What changed:  ?how much to take ?how to take this ?when to take this ?additional instructions ?  ?lidocaine 5 % ?Commonly known as: LIDODERM ?Place 1 patch onto the skin daily. Remove & Discard patch within 12 hours or as directed by MD ?  ?LORazepam 0.5 MG tablet ?Commonly known as: ATIVAN ?Take 0.25 mg by mouth See admin instructions. At bedtime x 7 days for anxiety ?What changed: Another medication with the same name was removed. Continue taking this medication, and follow the directions you see here. ?  ?metoprolol tartrate 25 MG tablet ?Commonly known as: LOPRESSOR ?Take 1 tablet (25 mg total) by mouth 2 (two) times daily. ?What changed: how much to take ?  ?QUEtiapine 25 MG tablet ?Commonly known as: SEROQUEL ?Take 1 tablet (25 mg total) by mouth at bedtime. ?  ?rosuvastatin 5 MG tablet ?Commonly known as: Crestor ?Take 1 tablet (5 mg total) by mouth at bedtime. ?  ?sodium chloride 1 g tablet ?TAKE 4 TABLETS = 4 GM BY MOUTH ONCE DAILY ?What changed: See the new instructions. ?  ?tamsulosin 0.4 MG Caps capsule ?Commonly known as: FLOMAX ?Take 0.4 mg by mouth at bedtime. ?  ? ?  ? ? ?Discharge Exam: ?There were no vitals filed for this visit. ? ? ? ?Physical Exam: ?  ?General:  AAO x 3,  cooperative, no distress;   ?HEENT:  Normocephalic, PERRL, otherwise with in Normal limits   ?Neuro:  CNII-XII intact. , normal motor and sensation, reflexes intact   ?Lungs:   Clear to auscultation BL,  Respirations unlabored,  ?No wheezes / crackles  ?Cardio:    S1/S2, RRR, No murmure, No Rubs or Gallops   ?Abdomen:  Soft, non-tender, bowel sounds active all four quadrants, ?no guarding or peritoneal signs.  ?Muscular  ?skeletal:  Limited exam -global generalized weaknesses ?- in bed, able to move all 4 extremities,   ?2+ pulses,  symmetric, No pitting edema  ?Skin:  Dry, warm to touch, negative for any Rashes,  ?Wounds: Please see nursing documentation ?   ? ? ?  ? ? ?Condition at discharge: stable ? ?The results of significant diagnostics from this hospitalization (including imaging, microbiology, ancillary and laboratory) are listed below for reference.  ? ?Imaging Studies: ?CT ANGIO HEAD NECK W WO CM ? ?Result Date: 12/31/2021 ?CLINICAL DATA:  Neuro deficit with acute stroke suspected EXAM: CT ANGIOGRAPHY HEAD AND NECK TECHNIQUE: Multidetector CT imaging of the head and neck was performed using the standard protocol during bolus administration  of intravenous contrast. Multiplanar CT image reconstructions and MIPs were obtained to evaluate the vascular anatomy. Carotid stenosis measurements (when applicable) are obtained utilizing NASCET criteria, using the distal internal carotid diameter as the denominator. RADIATION DOSE REDUCTION: This exam was performed according to the departmental dose-optimization program which includes automated exposure control, adjustment of the mA and/or kV according to patient size and/or use of iterative reconstruction technique. CONTRAST:  191m OMNIPAQUE IOHEXOL 350 MG/ML SOLN COMPARISON:  Brain MRI from yesterday FINDINGS: CT HEAD FINDINGS Brain: No visible acute infarct by CT. Extensive chronic small vessel ischemia in the hemispheric white matter. No acute hemorrhage, hydrocephalus, or masslike finding Vascular: No hyperdense vessel or unexpected calcification. Skull: Normal. Negative for fracture or focal lesion. Sinuses: Negative Orbits: Bilateral cataract resection Review  of the MIP images confirms the above findings CTA NECK FINDINGS Aortic arch: Atheromatous plaque with 3 vessel branching. Right carotid system: Mild for age atheromatous plaque without stenosis or ulceration Left carotid sy

## 2022-01-12 NOTE — TOC Initial Note (Signed)
Transition of Care (TOC) - Initial/Assessment Note  ? ? ?Patient Details  ?Name: Adam Swanson. ?MRN: [id number] ?Date of Birth: 24-Feb-1928 ? ?Transition of Care (TOC) CM/SW Contact:    ?Benard Halsted, LCSW ?Phone Number: ?01/12/2022, 12:45 PM ? ?Clinical Narrative:                 ?CSW alerted [place] that patient is ready to return to SNF today. CSW updated patient's son and he is in agreement with [facility] for transport once facility is ready. Requested MD print and sign any new scripts.  ? ?Expected Discharge Plan: [place] ?Barriers to Discharge: No Barriers Identified ? ? ?Patient Goals and CMS Choice ?Patient states their goals for this hospitalization and ongoing recovery are:: Return to snf ?CMS Medicare.gov Compare Post Acute Care list provided to:: Patient Represenative (must comment) ?Choice offered to / list presented to : Adult Children ? ?Expected Discharge Plan and Services ?Expected Discharge Plan: [place] ?In-house Referral: Clinical Social Work ?  ?Post Acute Care Choice: [place] ?Living arrangements for the past 2 months: [place] ?Expected Discharge Date: 01/12/22               ?  ?  ?  ?  ?  ?  ?  ?  ?  ?  ? ?Prior Living Arrangements/Services ?Living arrangements for the past 2 months: [place] ?Lives with:: Facility Resident ?Patient language and need for interpreter reviewed:: Yes ?Do you feel safe going back to the place where you live?: Yes      ?Need for Family Participation in Patient Care: Yes (Comment) ?Care giver support system in place?: Yes (comment) ?  ?Criminal Activity/Legal Involvement Pertinent to Current Situation/Hospitalization: No - Comment as needed ? ?Activities of Daily Living ?  ?  ? ?Permission Sought/Granted ?Permission sought to share information with : Facility Sport and exercise psychologist, Family Supports ?Permission granted to share information with : No ? Share Information with NAME:  [name] ? Permission granted to share info w AGENCY: [place] ? Permission granted to share info w Relationship: Son ? Permission granted to share info w Contact Information: [phone] ? ?Emotional Assessment ?Appearance:: Appears stated age ?Attitude/Demeanor/Rapport: Unable to Assess ?Affect (typically observed): Unable to Assess ?Orientation: : Oriented to Self ?Alcohol / Substance Use: Not Applicable ?Psych Involvement: No (comment) ? ?Admission diagnosis:  Pneumothorax, left [J93.9] ?Recurrent pneumothorax after chest tube removed [J95.811] ?Patient Active Problem List  ? Diagnosis Date Noted  ? 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  ? Recurrent pneumothorax after chest tube removed 01/08/2022  ? Pneumothorax, traumatic   ? Stroke [facility]) 12/30/2021  ? Goals of care, counseling/discussion   ? Delirium 12/28/2021  ? Anemia in chronic kidney disease (CKD) 12/27/2021  ? Dementia without behavioral disturbance ([place]) 12/27/2021  ? Hypokalemia 12/27/2021  ? A-fib ([place]) 12/26/2021  ? Traumatic fracture of ribs with pneumothorax, left, sequela 12/26/2021  ? Seizure ([place]) 12/26/2021  ? Anxiety   ? Chronic kidney disease, stage 3a ([place])   ? Gait abnormality 01/30/2017  ? Weakness 01/28/2017  ? Low back pain without sciatica 01/28/2017  ? Bronchopneumonia 09/24/2015  ? Hyponatremia 09/24/2015  ? Abnormal ECG 09/24/2015  ? HCAP (healthcare-associated pneumonia) 09/24/2015  ? Heart murmur 09/24/2015  ? 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 Benign neoplasm of colon 04/09/2011  ? Unspecified constipation 04/09/2011  ? Constipation 01/18/2011  ? Change in bowel function 01/18/2011  ? 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  General symptom  01/18/2011  ? Oropharyngeal dysphagia 01/18/2011  ? DIVERTICULOSIS, COLON 05/14/2007  ? HIATAL HERNIA 03/19/2003  ? ?PCP:  Hulan Fess, MD ?Pharmacy:  No Pharmacies Listed ? ? ? ?Social Determinants of Health (SDOH) Interventions ?  ? ?Readmission Risk  Interventions ?   ? View : No data to display.  ?  ?  ?  ? ? ? ?

## 2022-01-12 NOTE — Progress Notes (Signed)
HOSPITAL MEDICINE OVERNIGHT EVENT NOTE   ? ?Notified by nursing that patient has become increasingly agitated throughout the evening and in an attempt to go to the bedside commode to move his bowels is chest tube was inadvertently pulled out. ? ?I immediately went to go evaluate the patient at the bedside who happened to still be on the bedside commode.   ? ?On my evaluation patient did not appear to be in respiratory distress.  On lung examination other than scattered rhonchi with mild bibasilar rales patient exhibited equal breath sounds on both sides.  Palpation of the insertion site of the previous chest tube was palpated revealing no evidence of crepitus bleeding or significant tenderness. ? ?I suspect that at this point patient has not developed a recurrent pneumothorax.  We will obtain stat chest x-ray.  I have asked nursing to notify trauma surgery of this development, to inform them that a repeat chest x-ray has been performed and ask if they feel a chest tube needs to be reinserted.  I suspect they will say no.   ? ?We will continue to monitor closely with frequent pulse oximetry checks.   ? ?Vernelle Emerald  MD ?[facility]  ? ? ? ? ? ? ? ? ? ? ?

## 2022-01-15 ENCOUNTER — Other Ambulatory Visit: Payer: Self-pay | Admitting: *Deleted

## 2022-01-15 NOTE — Patient Outreach (Signed)
Per [place] eligible member currently resides in [facility] SNF.  Screening for potential post SNF care coordination/care management needs.  ? ?Member's PCP at [facility] at [place] has [facility] care management services available.  ? ?Notification sent facility SW to make aware writer following for transition plans.  ? ?Will send notification of discharge to [facility] care management if needed.  ? ? ?Marthenia Rolling, MSN, RN,BSN ?[place] Coordinator ?[phone] [facility]) ?[phone]  (Toll free office)   ?

## 2022-01-29 DIAGNOSIS — R410 Disorientation, unspecified: Secondary | ICD-10-CM | POA: Diagnosis not present

## 2022-01-29 DIAGNOSIS — R112 Nausea with vomiting, unspecified: Secondary | ICD-10-CM | POA: Diagnosis not present

## 2022-01-31 DIAGNOSIS — Z7689 Persons encountering health services in other specified circumstances: Secondary | ICD-10-CM | POA: Diagnosis not present

## 2022-01-31 DIAGNOSIS — E860 Dehydration: Secondary | ICD-10-CM | POA: Diagnosis not present

## 2022-01-31 DIAGNOSIS — D649 Anemia, unspecified: Secondary | ICD-10-CM | POA: Diagnosis not present

## 2022-01-31 DIAGNOSIS — N1831 Chronic kidney disease, stage 3a: Secondary | ICD-10-CM | POA: Diagnosis not present

## 2022-02-01 DIAGNOSIS — R2689 Other abnormalities of gait and mobility: Secondary | ICD-10-CM | POA: Diagnosis not present

## 2022-02-02 ENCOUNTER — Other Ambulatory Visit: Payer: Self-pay | Admitting: Nurse Practitioner

## 2022-02-02 DIAGNOSIS — R451 Restlessness and agitation: Secondary | ICD-10-CM | POA: Diagnosis not present

## 2022-02-02 DIAGNOSIS — R269 Unspecified abnormalities of gait and mobility: Secondary | ICD-10-CM

## 2022-02-02 DIAGNOSIS — K5900 Constipation, unspecified: Secondary | ICD-10-CM | POA: Diagnosis not present

## 2022-02-06 DIAGNOSIS — Z9181 History of falling: Secondary | ICD-10-CM | POA: Diagnosis not present

## 2022-02-06 DIAGNOSIS — R2689 Other abnormalities of gait and mobility: Secondary | ICD-10-CM | POA: Diagnosis not present

## 2022-02-06 DIAGNOSIS — M6281 Muscle weakness (generalized): Secondary | ICD-10-CM | POA: Diagnosis not present

## 2022-02-06 DIAGNOSIS — W19XXXA Unspecified fall, initial encounter: Secondary | ICD-10-CM | POA: Diagnosis not present

## 2022-02-06 DIAGNOSIS — R52 Pain, unspecified: Secondary | ICD-10-CM | POA: Diagnosis not present

## 2022-02-06 DIAGNOSIS — R410 Disorientation, unspecified: Secondary | ICD-10-CM | POA: Diagnosis not present

## 2022-02-09 DIAGNOSIS — Z7689 Persons encountering health services in other specified circumstances: Secondary | ICD-10-CM | POA: Diagnosis not present

## 2022-02-12 DIAGNOSIS — F02C4 Dementia in other diseases classified elsewhere, severe, with anxiety: Secondary | ICD-10-CM | POA: Diagnosis not present

## 2022-02-12 DIAGNOSIS — F03C4 Unspecified dementia, severe, with anxiety: Secondary | ICD-10-CM | POA: Diagnosis not present

## 2022-02-12 DIAGNOSIS — F064 Anxiety disorder due to known physiological condition: Secondary | ICD-10-CM | POA: Diagnosis not present

## 2022-02-13 DIAGNOSIS — Z7689 Persons encountering health services in other specified circumstances: Secondary | ICD-10-CM | POA: Diagnosis not present

## 2022-02-19 ENCOUNTER — Encounter: Payer: Self-pay | Admitting: Neurology

## 2022-02-19 ENCOUNTER — Ambulatory Visit (INDEPENDENT_AMBULATORY_CARE_PROVIDER_SITE_OTHER): Payer: Medicare Other | Admitting: Neurology

## 2022-02-19 VITALS — HR 94 | Ht 67.0 in | Wt 121.5 lb

## 2022-02-19 DIAGNOSIS — I63421 Cerebral infarction due to embolism of right anterior cerebral artery: Secondary | ICD-10-CM | POA: Diagnosis not present

## 2022-02-19 DIAGNOSIS — R569 Unspecified convulsions: Secondary | ICD-10-CM

## 2022-02-19 DIAGNOSIS — F03C0 Unspecified dementia, severe, without behavioral disturbance, psychotic disturbance, mood disturbance, and anxiety: Secondary | ICD-10-CM | POA: Insufficient documentation

## 2022-02-19 DIAGNOSIS — F03C18 Unspecified dementia, severe, with other behavioral disturbance: Secondary | ICD-10-CM

## 2022-02-19 DIAGNOSIS — R269 Unspecified abnormalities of gait and mobility: Secondary | ICD-10-CM | POA: Diagnosis not present

## 2022-02-19 NOTE — Patient Instructions (Signed)
----  Ativan 0.5 mg every night as needed only  ----Stop Seroquel '25mg'$  every night for now  -----Keep Lamotrigine at '50mg'$  twice a day,   ---May restart Seroquel '25mg'$  every night if he has trouble sleeping >=2 nights a week.

## 2022-02-19 NOTE — Progress Notes (Signed)
Chief Complaint  Patient presents with   New Patient (Initial Visit)    Rm 15. Accompanied by son. NX Dr. Orson Aloe 2018/internal referral for Seizure, CVA, dementia.      ASSESSMENT AND PLAN  Adam Swanson. is a 88 y.o. male   Dementia with nighttime agitations  MoCA examination 11/30  MRI/CT head showed significant atrophy, periventricular white matter disease, slow progression compared to previous CAT scan  Most consistent with central nervous system degenerative disorder, likely Alzheimer's disease  Complete laboratory evaluations to rule out treatable etiology  To simplify his medication list, I have suggested Ativan 0.5 mg only as needed, stop Seroquel for now, he denies difficulty sleeping, if needed, may add back on at low-dose 25 mg every night  With his general decline condition over the past few years, significant memory loss, gait abnormality, less likely he can go back to his independent living, I have suggested him continue 24/7 care  Gait abnormality  Related to his significant brain atrophy, periventricular white matter disease, also evidence of lumbar degenerative disease,  He often forgot to lock his loading walker when get up from seated position, is at high risk for fall,  Seizure-like activity  EEG,  Complaint side effect with high dose of lamotrigine, 100 mg twice a day, suggested tapering down to 50 mg twice a day,   DIAGNOSTIC DATA (LABS, IMAGING, TESTING) - I reviewed patient records, labs, notes, testing and imaging myself where available.  MRI of the brain without contrast April 2023: 1. Suspect small acute or subacute infarct in the subcortical right precentral gyrus. 2. Subacute or remote superficial hemorrhage along the parasagittal right frontal parietal convexity where there is a large cortical vessel question underlying vascular malformation. 3. Atrophy and extensive chronic small vessel ischemia.  Laboratory evaluations in 2023, normal  phosphate, magnesium, sodium 143 creatinine 1.26, CBC hemoglobin of 13.2, RDW of 17, lipid panel LDL 112, cholesterol 163, negative MuSK antibody, acetylcholine receptor antibody, Depakote level was 54, normal TSH,   MEDICAL HISTORY:  Adam Swanson. is a 88 year old male, seen in request by Dr. Myrene Buddy for evaluation of memory loss, he is accompanied by his son at today's visit  I reviewed and summarized the referring note.  Past medical history Atrial fibrillation, anticoagulation was on hold, Hospital admission in May 2023 for recurrent pneumothorax, with effusion, recent fall, SIADH, on chronic demeclocycline, salt tab Dementia Seizure Hypertension  He lives at [facility] for few years, is a retired Secondary school teacher, I saw him previously in 2018 for evaluation of gait abnormality, work-up showed chronic T12-L1 fracture, moderate spinal stenosis at L4, multilevel degenerative changes,  He fell on December 26, 2021, leading to hospital admission, patient reported that he was in a standing position, then fell to the ground, he was able to push his medical alarm, staff arrived, he was noted to have seizure-like activity lasting about 5 minutes, patient has no recollection of the event, during today's interview, he was noted to lack of insight of his current condition, MoCA examination was 11 out of 30,  Personally reviewed CT head, and MRI brain, small subacute infarction in the subcortical white precentral gyri, remote superficial hemorrhage along the parasagittal right frontal parietal convexity, atrophy extensive small vessel disease  CT angiogram of the neck and head showed no large vessel disease, abnormal cortical vessels at the right parietal region, 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  He suffered left ribs fracture, left pneumothorax, chest tube placement,  EEG  was unremarkable, he was started on lamotrigine, by early June he supposed to take 75 mg twice a day,  atrial fibrillation, for risk, anticoagulation was on hold,  He was noted to have persistent delirium, during hospital stay,  Patient was discharged to [facility] but under nursing 24/7 care now instead of previous independent living since May 2023  When he was first discharged, he was noted to have excessive sleepiness, now has improved back to his baseline, he is still worried about excessive sleepiness is due to his lamotrigine treatment, also questioning whether this reported by the shaking like activity truly represent a seizure  He denies significant pain now, gait abnormality,     PHYSICAL EXAM:   Vitals:   02/19/22 1055  BP: 102/70  Pulse: 94  Weight: 121 lb 8 oz (55.1 kg)  Height: '5\' 7"'$  (1.702 m)    Body mass index is 19.03 kg/m.  PHYSICAL EXAMNIATION:  Gen: NAD, conversant, well nourised, well groomed                     Cardiovascular: Regular rate rhythm, no peripheral edema, warm, nontender. Eyes: Conjunctivae clear without exudates or hemorrhage Neck: Supple, no carotid bruits. Pulmonary: Clear to auscultation bilaterally   NEUROLOGICAL EXAM:  MENTAL STATUS: Speech/cognition: Awake, alert, oriented to history taking and casual conversation     02/19/2022   10:59 AM  Montreal Cognitive Assessment   Visuospatial/ Executive (0/5) 1  Naming (0/3) 2  Attention: Read list of digits (0/2) 2  Attention: Read list of letters (0/1) 1  Attention: Serial 7 subtraction starting at 100 (0/3) 0  Language: Repeat phrase (0/2) 2  Language : Fluency (0/1) 0  Abstraction (0/2) 1  Delayed Recall (0/5) 0  Orientation (0/6) 2  Total 11    CRANIAL NERVES: CN II: Visual fields are full to confrontation. Pupils are round equal and briskly reactive to light. CN III, IV, VI: extraocular movement are normal. No ptosis. CN V: Facial sensation is intact to light touch CN VII: Face is symmetric with normal eye closure  CN VIII: Hearing is normal to causal conversation. CN  IX, X: Phonation is normal. CN XI: Head turning and shoulder shrug are intact  MOTOR: Upper extremity motor strength is normal, mild bilateral hip flexion weakness, mild bilateral ankle dorsiflexion weakness, right worse than left  REFLEXES: Reflexes are 1  and symmetric at the biceps, triceps, knees, and ankles. Plantar responses are flexor.  SENSORY: Intact to light touch, pinprick and vibratory sensation    COORDINATION: There is no trunk or limb dysmetria noted.  GAIT/STANCE: Need push-up to get up from seated position, forgot to lock his rolling walker, unsteady, dragging both feet across the floor,  REVIEW OF SYSTEMS:  Full 14 system review of systems performed and notable only for as above All other review of systems were negative.   ALLERGIES: Allergies  Allergen Reactions   Bee Venom Anaphylaxis    Other reaction(s): Other (See Comments) Loss of consciousness   Nsaids     Other reaction(s): intol   Other Other (See Comments)    HOME MEDICATIONS: Current Outpatient Medications  Medication Sig Dispense Refill   demeclocycline (DECLOMYCIN) 150 MG tablet Take 1 tablet (150 mg total) by mouth 2 (two) times daily. (Patient taking differently: Take 150 mg by mouth 2 (two) times daily. Continuously) 180 tablet 2   EPINEPHrine 0.3 mg/0.3 mL IJ SOAJ injection Inject 0.3 mg into the muscle as needed for anaphylaxis.  famotidine (PEPCID) 20 MG tablet Take 20 mg by mouth every evening.     furosemide (LASIX) 20 MG tablet TAKE 2 TABLET = 40 MG BY MOUTH ONCE DAILY (Patient taking differently: Take 40 mg by mouth daily.) 180 tablet 0   lamoTRIgine (LAMICTAL) 25 MG tablet Take 25 mg nightly for 6 more days, then take 25 mg twice daily for 1 week, then take 50 mg in AM and 25 mg in PM for 1 week then continue taper up as outlined in discharge summary.  25 mg increase per week over 8 weeks with goal 100 BID (Patient taking differently: Take 25 mg by mouth See admin instructions. 25 mg  hs x 6 days, bid x 1 week, 50 mg in the morning and 25 mg hs x 1 week, increase by 25 mg per week over 8 weeks to reach goal of 100 mg bid)     LORazepam (ATIVAN) 0.5 MG tablet Take 0.25 mg by mouth See admin instructions. At bedtime x 7 days for anxiety     metoprolol tartrate (LOPRESSOR) 25 MG tablet Take 1 tablet (25 mg total) by mouth 2 (two) times daily. 60 tablet 2   QUEtiapine (SEROQUEL) 25 MG tablet Take 1 tablet (25 mg total) by mouth at bedtime.     rosuvastatin (CRESTOR) 5 MG tablet Take 1 tablet (5 mg total) by mouth at bedtime.     sodium chloride 1 g tablet TAKE 4 TABLETS = 4 GM BY MOUTH ONCE DAILY (Patient taking differently: Take 4 g by mouth daily.) 360 tablet 3   tamsulosin (FLOMAX) 0.4 MG CAPS capsule Take 0.4 mg by mouth at bedtime.     No current facility-administered medications for this visit.    PAST MEDICAL HISTORY: Past Medical History:  Diagnosis Date   Anxiety    Colon polyp    hyperplastic   Diverticulosis of colon (without mention of hemorrhage)    Esophageal stricture    GERD (gastroesophageal reflux disease)    Hiatal hernia    Lower extremity weakness    OCD (obsessive compulsive disorder)    Viral hepatitis 08/1983    PAST SURGICAL HISTORY: Past Surgical History:  Procedure Laterality Date   COLONOSCOPY     HEMORRHOID SURGERY     1965    FAMILY HISTORY: Family History  Problem Relation Age of Onset   Diverticulosis Mother    Melanoma Mother    Lung cancer Father    Colon cancer Other        1st cousion.   Allergic rhinitis Neg Hx    Angioedema Neg Hx    Asthma Neg Hx    Eczema Neg Hx    Immunodeficiency Neg Hx    Urticaria Neg Hx     SOCIAL HISTORY: Social History   Socioeconomic History   Marital status: Widowed    Spouse name: Not on file   Number of children: 2   Years of education: HS   Highest education level: Not on file  Occupational History   Occupation: Retired  Tobacco Use   Smoking status: Never   Smokeless  tobacco: Never  Vaping Use   Vaping Use: Never used  Substance and Sexual Activity   Alcohol use: Yes    Alcohol/week: 7.0 standard drinks of alcohol    Types: 7 Standard drinks or equivalent per week    Comment: occasional   Drug use: No   Sexual activity: Not on file  Other Topics Concern   Not on  file  Social History Narrative   Lives at home alone.   Right-handed.   No caffeine use.   Social Determinants of Health   Financial Resource Strain: Not on file  Food Insecurity: Not on file  Transportation Needs: Not on file  Physical Activity: Not on file  Stress: Not on file  Social Connections: Not on file  Intimate Partner Violence: Not on file      Marcial Pacas, M.D. Ph.D.  [facility] [address], [place], [place] [address] Ph: [phone] Fax: [phone]  CC:  Edwin Dada, MD [place],  [place] [id number]  Hulan Fess, MD

## 2022-02-20 DIAGNOSIS — R410 Disorientation, unspecified: Secondary | ICD-10-CM | POA: Diagnosis not present

## 2022-02-20 DIAGNOSIS — N182 Chronic kidney disease, stage 2 (mild): Secondary | ICD-10-CM | POA: Diagnosis not present

## 2022-02-20 DIAGNOSIS — R451 Restlessness and agitation: Secondary | ICD-10-CM | POA: Diagnosis not present

## 2022-02-20 DIAGNOSIS — D649 Anemia, unspecified: Secondary | ICD-10-CM | POA: Diagnosis not present

## 2022-02-20 LAB — VITAMIN B12: Vitamin B-12: 511 pg/mL (ref 232–1245)

## 2022-02-22 DIAGNOSIS — S270XXD Traumatic pneumothorax, subsequent encounter: Secondary | ICD-10-CM | POA: Diagnosis not present

## 2022-03-01 ENCOUNTER — Encounter: Payer: Self-pay | Admitting: *Deleted

## 2022-03-01 ENCOUNTER — Other Ambulatory Visit: Payer: Medicare Other | Admitting: *Deleted

## 2022-03-02 DIAGNOSIS — Z7689 Persons encountering health services in other specified circumstances: Secondary | ICD-10-CM | POA: Diagnosis not present

## 2022-03-07 ENCOUNTER — Ambulatory Visit
Admission: RE | Admit: 2022-03-07 | Discharge: 2022-03-07 | Disposition: A | Discharge status: Home / Self Care | Payer: Medicare Other | Source: Ambulatory Visit | Attending: Nurse Practitioner | Admitting: Nurse Practitioner

## 2022-03-07 DIAGNOSIS — G319 Degenerative disease of nervous system, unspecified: Secondary | ICD-10-CM | POA: Diagnosis not present

## 2022-03-07 DIAGNOSIS — J3489 Other specified disorders of nose and nasal sinuses: Secondary | ICD-10-CM | POA: Diagnosis not present

## 2022-03-07 DIAGNOSIS — R413 Other amnesia: Secondary | ICD-10-CM | POA: Diagnosis not present

## 2022-03-07 DIAGNOSIS — I6782 Cerebral ischemia: Secondary | ICD-10-CM | POA: Diagnosis not present

## 2022-03-07 DIAGNOSIS — R269 Unspecified abnormalities of gait and mobility: Secondary | ICD-10-CM

## 2022-03-19 DIAGNOSIS — B379 Candidiasis, unspecified: Secondary | ICD-10-CM | POA: Diagnosis not present

## 2022-03-19 DIAGNOSIS — F064 Anxiety disorder due to known physiological condition: Secondary | ICD-10-CM | POA: Diagnosis not present

## 2022-03-19 DIAGNOSIS — G301 Alzheimer's disease with late onset: Secondary | ICD-10-CM | POA: Diagnosis not present

## 2022-03-19 DIAGNOSIS — F02B4 Dementia in other diseases classified elsewhere, moderate, with anxiety: Secondary | ICD-10-CM | POA: Diagnosis not present

## 2022-03-27 DIAGNOSIS — Z7689 Persons encountering health services in other specified circumstances: Secondary | ICD-10-CM | POA: Diagnosis not present

## 2022-03-27 DIAGNOSIS — F0390 Unspecified dementia without behavioral disturbance: Secondary | ICD-10-CM | POA: Diagnosis not present

## 2022-03-29 ENCOUNTER — Emergency Department (HOSPITAL_COMMUNITY): Payer: Medicare Other

## 2022-03-29 ENCOUNTER — Emergency Department (HOSPITAL_COMMUNITY)
Admission: EM | Admit: 2022-03-29 | Discharge: 2022-03-29 | Disposition: A | Discharge status: Home / Self Care | Payer: Medicare Other | Attending: Emergency Medicine | Admitting: Emergency Medicine

## 2022-03-29 ENCOUNTER — Inpatient Hospital Stay (HOSPITAL_COMMUNITY): Admit: 2022-03-29 | Payer: Medicare Other

## 2022-03-29 DIAGNOSIS — F0390 Unspecified dementia without behavioral disturbance: Secondary | ICD-10-CM | POA: Diagnosis not present

## 2022-03-29 DIAGNOSIS — G40909 Epilepsy, unspecified, not intractable, without status epilepticus: Secondary | ICD-10-CM | POA: Diagnosis not present

## 2022-03-29 DIAGNOSIS — R259 Unspecified abnormal involuntary movements: Secondary | ICD-10-CM | POA: Diagnosis not present

## 2022-03-29 DIAGNOSIS — R251 Tremor, unspecified: Secondary | ICD-10-CM | POA: Insufficient documentation

## 2022-03-29 DIAGNOSIS — R569 Unspecified convulsions: Secondary | ICD-10-CM | POA: Diagnosis not present

## 2022-03-29 DIAGNOSIS — R300 Dysuria: Secondary | ICD-10-CM | POA: Insufficient documentation

## 2022-03-29 DIAGNOSIS — R9431 Abnormal electrocardiogram [ECG] [EKG]: Secondary | ICD-10-CM | POA: Diagnosis not present

## 2022-03-29 DIAGNOSIS — R001 Bradycardia, unspecified: Secondary | ICD-10-CM | POA: Diagnosis not present

## 2022-03-29 DIAGNOSIS — R42 Dizziness and giddiness: Secondary | ICD-10-CM | POA: Diagnosis not present

## 2022-03-29 DIAGNOSIS — W19XXXA Unspecified fall, initial encounter: Secondary | ICD-10-CM | POA: Diagnosis not present

## 2022-03-29 LAB — COMPREHENSIVE METABOLIC PANEL
ALT: 12 U/L (ref 0–44)
AST: 23 U/L (ref 15–41)
Albumin: 3 g/dL — ABNORMAL LOW (ref 3.5–5.0)
Alkaline Phosphatase: 72 U/L (ref 38–126)
Anion gap: 7 (ref 5–15)
BUN: 17 mg/dL (ref 8–23)
CO2: 23 mmol/L (ref 22–32)
Calcium: 8.6 mg/dL — ABNORMAL LOW (ref 8.9–10.3)
Chloride: 106 mmol/L (ref 98–111)
Creatinine, Ser: 1.13 mg/dL (ref 0.61–1.24)
GFR, Estimated: >60 mL/min (ref >60)
Glucose, Bld: 95 mg/dL (ref 70–99)
Potassium: 4.1 mmol/L (ref 3.5–5.1)
Sodium: 136 mmol/L (ref 135–145)
Total Bilirubin: 1 mg/dL (ref 0.3–1.2)
Total Protein: 5.8 g/dL — ABNORMAL LOW (ref 6.5–8.1)

## 2022-03-29 LAB — URINALYSIS, ROUTINE W REFLEX MICROSCOPIC
Bilirubin Urine: NEGATIVE
Glucose, UA: NEGATIVE mg/dL
Hgb urine dipstick: NEGATIVE
Ketones, ur: NEGATIVE mg/dL
Leukocytes,Ua: NEGATIVE
Nitrite: NEGATIVE
Protein, ur: NEGATIVE mg/dL
Specific Gravity, Urine: 1.006 (ref 1.005–1.030)
pH: 5 (ref 5.0–8.0)

## 2022-03-29 LAB — CBC WITH DIFFERENTIAL/PLATELET
Abs Immature Granulocytes: 0.04 10*3/uL (ref 0.00–0.07)
Basophils Absolute: 0 10*3/uL (ref 0.0–0.1)
Basophils Relative: 1 %
Eosinophils Absolute: 0.3 10*3/uL (ref 0.0–0.5)
Eosinophils Relative: 4 %
HCT: 35.4 % — ABNORMAL LOW (ref 39.0–52.0)
Hemoglobin: 11.5 g/dL — ABNORMAL LOW (ref 13.0–17.0)
Immature Granulocytes: 1 %
Lymphocytes Relative: 22 %
Lymphs Abs: 1.4 10*3/uL (ref 0.7–4.0)
MCH: 30.7 pg (ref 26.0–34.0)
MCHC: 32.5 g/dL (ref 30.0–36.0)
MCV: 94.4 fL (ref 80.0–100.0)
Monocytes Absolute: 0.9 10*3/uL (ref 0.1–1.0)
Monocytes Relative: 15 %
Neutro Abs: 3.7 10*3/uL (ref 1.7–7.7)
Neutrophils Relative %: 57 %
Platelets: 259 10*3/uL (ref 150–400)
RBC: 3.75 MIL/uL — ABNORMAL LOW (ref 4.22–5.81)
RDW: 15.8 % — ABNORMAL HIGH (ref 11.5–15.5)
WBC: 6.4 10*3/uL (ref 4.0–10.5)
nRBC: 0 % (ref 0.0–0.2)

## 2022-03-29 LAB — CBG MONITORING, ED: Glucose-Capillary: 100 mg/dL — ABNORMAL HIGH (ref 70–99)

## 2022-03-29 MED ORDER — DOCUSATE SODIUM 50 MG/5ML PO LIQD
50.0000 mg | Freq: Once | ORAL | Status: AC
Start: 2022-03-29 — End: 2022-03-29
  Administered 2022-03-29: 50 mg via OTIC
  Filled 2022-03-29: qty 10

## 2022-03-29 MED ORDER — LAMOTRIGINE 25 MG PO TABS
25.0000 mg | ORAL_TABLET | Freq: Once | ORAL | Status: AC
Start: 2022-03-29 — End: 2022-03-29
  Administered 2022-03-29: 25 mg via ORAL
  Filled 2022-03-29: qty 1

## 2022-03-29 NOTE — ED Triage Notes (Signed)
Lives in [place]. Facility called 911 for seizure activity. EMS described seizure episode as pt was alert during episode and was holding on to a table while having tremors. Pt was complaining of dizziness with drop in bp to 90s on standing. Pt has not eaten since this AM. Alert and oriented at baseline per staff. No post ictal phase. Hx of seizure, stroke and dementia. 500cc bolus NS given by EMS.

## 2022-03-29 NOTE — ED Notes (Signed)
Pt verbalized understanding of d/c instructions, meds, and followup care. Denies questions. VSS, no distress noted. Steady gait to exit with all belongings.  ?

## 2022-03-29 NOTE — Consult Note (Signed)
Neurology Consultation Reason for Consult: Concern for seizure Requesting Physician: Pryor Curia  CC: "I had a seizure"  History is obtained from: Patient, son at bedside, chart review  HPI: Adam Swanson. is a 88 y.o. male with a past medical history significant for dementia, anxiety, OCD, multifactorial gait disorder.  Son notes that they were eating at [facility] today when the patient suddenly began to feel like he was falling.  Both of his arms and possibly his legs were tremoring and he was yelling out help help.  He did not lose consciousness and he did not lose the ability to talk for the duration of the episode.  He did not have a post event confusion.  Notably he had been very sedated on seizure medications that were started at his last hospitalization.  In June 2023 decision was made to taper lamotrigine and he has possibly discontinued this.  Additionally he takes Ativan nightly at his facility.  He has otherwise been in his usual state of health per son.  While in the ED he had a second episode that was witnessed by ED providers.  Again he was holding the railing of his bed while still in bed, shaking diffusely and crying out for help.  This lasted 30 to 60 seconds as witnessed by ED providers (longer as onset was witnessed by son, maybe 1 or 2 minutes), and again the creation had no postevent state  Since his last hospitalization he has transition from independent living to 24/7 care.  Son notes that the right eye ptosis documented on the last hospitalization is chronic, intermittent but longstanding and the patient has had multiple surgeries for the same.  Additionally he notes that the dysphagia noted at the last hospitalization has resolved and patient has been eating and drinking well  ROS: Unable to obtain due to altered mental status.   Past Medical History:  Diagnosis Date   Anxiety    Colon polyp    hyperplastic   Diverticulosis of colon (without mention of  hemorrhage)    Esophageal stricture    GERD (gastroesophageal reflux disease)    Hiatal hernia    Lower extremity weakness    OCD (obsessive compulsive disorder)    Viral hepatitis 08/1983   Past Surgical History:  Procedure Laterality Date   COLONOSCOPY     HEMORRHOID SURGERY     1965    Family History  Problem Relation Age of Onset   Diverticulosis Mother    Melanoma Mother    Lung cancer Father    Colon cancer Other        1st cousion.   Allergic rhinitis Neg Hx    Angioedema Neg Hx    Asthma Neg Hx    Eczema Neg Hx    Immunodeficiency Neg Hx    Urticaria Neg Hx    Social History:  reports that he has never smoked. He has never used smokeless tobacco. He reports current alcohol use of about 7.0 standard drinks of alcohol per week. He reports that he does not use drugs.   Exam: Current vital signs: BP 115/65   Pulse 65   Temp 98.2 F (36.8 C) (Oral)   Resp 17   SpO2 96%  Vital signs in last 24 hours: Temp:  [98 F (36.7 C)-98.2 F (36.8 C)] 98.2 F (36.8 C) (07/20 1725) Pulse Rate:  [58-72] 65 (07/20 1800) Resp:  [15-27] 17 (07/20 1800) BP: (102-135)/(65-84) 115/65 (07/20 1800) SpO2:  [96 %-100 %] 96 % (  07/20 1800)   Physical Exam  Constitutional: Appears stated age Psych: Affect mildly anxious Eyes: No scleral injection HENT: No oropharyngeal obstruction.  MSK: no significant joint deformities Cardiovascular: Perfusing extremities well Respiratory: Effort normal, non-labored breathing GI: Soft.  No distension. There is no tenderness.  Skin: Warm dry and intact visible skin  Neuro: Mental Status: Mild aphasia (delayed word finding), only able to give limited history, somewhat confused on details of living arrangements Cranial Nerves: II: Visual Fields are full.  III,IV, VI: EOMI with a mild right eye ptosis which is chronic V: Facial sensation is symmetric to temperature VII: Facial movement is symmetric.  VIII: hearing is intact to voice X:  Uvula elevates symmetrically XI: Shoulder shrug is symmetric. XII: tongue is midline without atrophy or fasciculations.  Motor: Mild pronation of the bilateral upper extremities without drift (incomplete supination).  4+/5 in bilateral hip flexors Sensory: Sensation is symmetric to light touch and temperature in the arms and legs. Cerebellar: Finger-nose intact bilaterally Gait:  Deferred   I have reviewed labs in epic and the results pertinent to this consultation are:  Basic Metabolic Panel: Recent Labs  Lab 03/29/22 1323  NA 136  K 4.1  CL 106  CO2 23  GLUCOSE 95  BUN 17  CREATININE 1.13  CALCIUM 8.6*    CBC: Recent Labs  Lab 03/29/22 1323  WBC 6.4  NEUTROABS 3.7  HGB 11.5*  HCT 35.4*  MCV 94.4  PLT 259    I have reviewed the images obtained:  Head CT personally reviewed, agree with radiology that there is no acute intracranial pathology  Impression: Behavioral spells, possibly anxiety attacks  Extended discussion with son at bedside about how the semiology of these events is reassuring, discussed what signs and symptoms would be concerning for true seizure activity given patient is at risk due to his advanced age and history of dementia.  We also discussed the risk of benzodiazepine withdrawal seizure given the patient does receive Ativan daily  Recommendations: -No further inpatient neurological work-up -Close outpatient with follow-up with psychiatry -Close outpatient follow-up with neurology -Clarification with facility regarding patient's current medication list as son is unsure whether the patient is still getting Ativan daily -Do not start antiseizure medications at this time -No indication for EEG  Lesleigh Noe MD-PhD [facility] [phone]  Greater than 50 minutes were spent in care of this patient today, greater than 50% at bedside in detailed discussion with son and patient as documented above

## 2022-03-29 NOTE — ED Provider Notes (Signed)
[facility] EMERGENCY DEPARTMENT Provider Note   CSN: [id number] Arrival date & time: 03/29/22  1255     History  Chief Complaint  Patient presents with   Seizures    Adam Swanson. is a 88 y.o. male.   Dizziness   Patient with medical history of dementia, history of seizures (no longer on lamictal)l, previous stroke not on anticoagulation presents today due to seizure like activity.  Patient was at his facility, he started having shaking of the upper and lower extremities bilaterally.  Was not postictal, was witnessed by EMS and nursing staff.  His blood pressure dropped to 90 systolic at that time, not hypoxic.  Given 500 mL normal saline by EMS, brought to ED for further evaluation.  Patient denies any prodromal symptoms, has not had any fevers or chills.  Patient did not bite tongue, no urinary incontinence during episode.  Patient's son Adam Swanson in room providing additional history-patient was eating [employer] at the time.  He was sitting down at the table, he grabbed the table and started saying going to fall, he had tremors in his upper extremities and lower extremities bilaterally. Was consc8ous and verbal during episode. This happened 1 other time, previous they states it was unclear if he had a seizure but he has been seen by neurology.  Was recently taken him off the Lamictal recently because it was too sedating.   RN from [facility] assisted living living- seizure like activity, alert during episode gripping table and screaming "I feel like I'm going to fall". Lasted 2 minutes, son was there.  Patient is not being treated for UTI. Not on blood thinners.  No recent changes in medicine.  Home Medications Prior to Admission medications   Medication Sig Start Date End Date Taking? Authorizing Provider  acetaminophen (TYLENOL) 325 MG tablet Take 650 mg by mouth every 6 (six) hours as needed for mild pain (pain).   Yes [provider]   demeclocycline (DECLOMYCIN) 150 MG tablet Take 1 tablet (150 mg total) by mouth 2 (two) times daily. Patient taking differently: Take 150 mg by mouth 2 (two) times daily. Continuously 08/21/19  Yes Renato Shin, MD  EPINEPHrine 0.3 mg/0.3 mL IJ SOAJ injection Inject 0.3 mg into the muscle as needed for anaphylaxis. 11/03/21  Yes [provider]  famotidine (PEPCID) 20 MG tablet Take 20 mg by mouth every evening.   Yes [provider]  furosemide (LASIX) 20 MG tablet TAKE 2 TABLET = 40 MG BY MOUTH ONCE DAILY Patient taking differently: Take 40 mg by mouth daily. 05/17/21  Yes Renato Shin, MD  LORazepam (ATIVAN) 0.5 MG tablet Take 0.5 mg by mouth every 8 (eight) hours as needed for anxiety or seizure (agitation). At bedtime x 7 days for anxiety   Yes [provider]  metoprolol tartrate (LOPRESSOR) 25 MG tablet Take 1 tablet (25 mg total) by mouth 2 (two) times daily. 01/12/22 04/12/22 Yes Shahmehdi, Valeria Batman, MD  Nutritional Supplements (NUTRITIONAL DRINK PO) Take 1 Dose by mouth 2 (two) times daily. MedPass 2.0   Yes [provider]  nystatin powder Apply 1 Application topically 3 (three) times daily.   Yes [provider]  senna (SENOKOT) 8.6 MG TABS tablet Take 17.2 mg by mouth daily.   Yes [provider]  sodium chloride 1 g tablet TAKE 4 TABLETS = 4 GM BY MOUTH ONCE DAILY Patient taking differently: Take 4 g by mouth daily. 02/20/21  Yes Renato Shin, MD  tamsulosin (FLOMAX) 0.4 MG CAPS capsule Take 0.4 mg by mouth at bedtime.   Yes [provider]  lamoTRIgine (LAMICTAL) 25 MG tablet Take 25 mg nightly for 6 more days, then take 25 mg twice daily for 1 week, then take 50 mg in AM and 25 mg in PM for 1 week then continue taper up as outlined in discharge summary.  25 mg increase per week over 8 weeks with goal 100 BID Patient not taking: Reported on 03/29/2022 01/02/22   Edwin Dada, MD  QUEtiapine (SEROQUEL) 25 MG tablet Take  1 tablet (25 mg total) by mouth at bedtime. Patient not taking: Reported on 03/29/2022 01/02/22   Edwin Dada, MD  rosuvastatin (CRESTOR) 5 MG tablet Take 1 tablet (5 mg total) by mouth at bedtime. Patient not taking: Reported on 03/29/2022 01/02/22 01/02/23  Edwin Dada, MD      Allergies    Bee venom, Nsaids, and Other    Review of Systems   Review of Systems  Neurological:  Positive for dizziness.    Physical Exam Updated Vital Signs BP 135/65   Pulse 72   Temp 98.2 F (36.8 C) (Oral)   Resp (!) 24   SpO2 100%  Physical Exam Vitals and nursing note reviewed. Exam conducted with a chaperone present.  Constitutional:      Appearance: Normal appearance.  HENT:     Head: Normocephalic and atraumatic.     Comments: No lacerations to scalp.  No periorbital ecchymosis or tongue laceration or abrasion    Ears:     Comments: Cerumen bilaterally to your canal Eyes:     General: No scleral icterus.       Right eye: No discharge.        Left eye: No discharge.     Extraocular Movements: Extraocular movements intact.     Pupils: Pupils are equal, round, and reactive to light.     Comments: Pupils constricted bilaterally  Cardiovascular:     Rate and Rhythm: Regular rhythm. Bradycardia present.     Pulses: Normal pulses.     Heart sounds: Normal heart sounds. No murmur heard.    No friction rub. No gallop.  Pulmonary:     Effort: Pulmonary effort is normal. No respiratory distress.     Breath sounds: Normal breath sounds.  Abdominal:     General: Abdomen is flat. Bowel sounds are normal. There is no distension.     Palpations: Abdomen is soft.     Tenderness: There is no abdominal tenderness.  Musculoskeletal:     Cervical back: No tenderness.  Skin:    General: Skin is warm and dry.     Coloration: Skin is not jaundiced.  Neurological:     Mental Status: He is alert. Mental status is at baseline.     Coordination: Coordination normal.     Comments:  Patient is at baseline.  Follows commands, cranial nerves III through XII are grossly intact.  Grip strength equal bilaterally.  Can raise both lower extremities.     ED Results / Procedures / Treatments   Labs (all labs ordered are listed, but only abnormal results are displayed) Labs Reviewed  CBC WITH DIFFERENTIAL/PLATELET - Abnormal; Notable for the following components:      Result Value   RBC 3.75 (*)    Hemoglobin 11.5 (*)    HCT 35.4 (*)    RDW 15.8 (*)    All other components within normal limits  COMPREHENSIVE  METABOLIC PANEL - Abnormal; Notable for the following components:   Calcium 8.6 (*)    Total Protein 5.8 (*)    Albumin 3.0 (*)    All other components within normal limits  URINALYSIS, ROUTINE W REFLEX MICROSCOPIC - Abnormal; Notable for the following components:   Color, Urine STRAW (*)    All other components within normal limits  CBG MONITORING, ED - Abnormal; Notable for the following components:   Glucose-Capillary 100 (*)    All other components within normal limits  URINE CULTURE  LAMOTRIGINE LEVEL    EKG EKG Interpretation  Date/Time:  Thursday March 29 2022 13:12:48 EDT Ventricular Rate:  60 PR Interval:  136 QRS Duration: 123 QT Interval:  445 QTC Calculation: 445 R Axis:   -58 Text Interpretation: Sinus rhythm incomplete rbbb Left anterior fasicular block No significant change since last tracing 11 Jan 2022 Confirmed by Pattricia Boss [phone]) on 03/29/2022 2:18:13 PM  Radiology CT Head Wo Contrast  Result Date: 03/29/2022 CLINICAL DATA:  Dizziness, seizure activity EXAM: CT HEAD WITHOUT CONTRAST TECHNIQUE: Contiguous axial images were obtained from the base of the skull through the vertex without intravenous contrast. RADIATION DOSE REDUCTION: This exam was performed according to the departmental dose-optimization program which includes automated exposure control, adjustment of the mA and/or kV according to patient size and/or use of iterative  reconstruction technique. COMPARISON:  03/07/2022 FINDINGS: Brain: No evidence of acute infarction, hemorrhage, hydrocephalus, extra-axial collection or mass lesion/mass effect. Extensive periventricular and deep white matter hypodensity, with mild global cerebral volume loss. Vascular: No hyperdense vessel or unexpected calcification. Skull: Normal. Negative for fracture or focal lesion. Sinuses/Orbits: No acute finding. Other: None. IMPRESSION: No acute intracranial pathology. Advanced small-vessel white matter disease and mild global cerebral volume loss in keeping with patient age. Electronically Signed   By: Delanna Ahmadi M.D.   On: 03/29/2022 15:28   DG Chest Portable 1 View  Result Date: 03/29/2022 CLINICAL DATA:  Fall; seizure activity EXAM: PORTABLE CHEST 1 VIEW COMPARISON:  Radiographs 01/12/2022 FINDINGS: Stable cardiomegaly. Low lung volumes. No focal consolidation, pleural effusion or pneumothorax. Probable hiatal hernia. Degenerative arthritis both shoulders. IMPRESSION: No acute process or significant change from 01/12/2022. Electronically Signed   By: Placido Sou M.D.   On: 03/29/2022 14:26    Procedures Procedures    Medications Ordered in ED Medications  docusate (COLACE) 50 MG/5ML liquid 50 mg (has no administration in time range)  lamoTRIgine (LAMICTAL) tablet 25 mg (25 mg Oral Given 03/29/22 1729)    ED Course/ Medical Decision Making/ A&P Clinical Course as of 03/29/22 1747  Thu Mar 29, 2022  1631 Patient was having tonic-clonic activity, holding the rails and saying "help me".  Was hypotensive again at 61/60. [HS]    Clinical Course User Index [HS] Sherrill Raring, PA-C                           Medical Decision Making Amount and/or Complexity of Data Reviewed Labs: ordered. Radiology: ordered.  Risk OTC drugs. Prescription drug management.   This is a 88 year old male presenting today with tonic-clonic activity.  Differential includes but not limited to  seizure, hypotension, arrhythmia, metabolic derangement.  I reviewed external records including patient's most recent neurology visit with Dr. Krista Blue.  Also reviewed imaging, he had extensive neuroimaging including MRI brain and CTA head neck.  Neurology stopped his Seroquel, advise continuing taking Lamictal.  Patient has not been taking the Lamictal.  On exam there are no appreciable focal neurodeficits.  His blood pressure was slightly soft, lungs are clear to auscultation.  I do not see any traumatic findings.  I ordered and patient laboratory work-up.  No leukocytosis, mild anemia with hemoglobin 11.5.  CMP is without any gross electrolyte derangement, transaminitis, AKI.  Patient is mildly hyperglycemic at 100.  UA is negative for any signs of UTI.  I ordered and reviewed chest x-ray and CT head.  No acute process.  I consulted with Dr. Curly Shores.  She reviewed imaging that was done previously and agrees that patient does not need any additional neurologic work-up in the emergency department.  She advises close follow-up with neurology outpatient.  I discussed patient should restart the Lamictal, advised calling his neurologist tomorrow to discuss possible return to his Lamictal if they are unable to tolerate the side effects.   Prior to discharge patient did have tonic-clonic activity, he did not appear to have a grand mal type seizure.  Given this activity I will reconsult with neurology for any recommendations and plan for likely admission for observation.  Spoke with Dr. Curly Shores who will evaluate the patient.  We will also give patient home dose of Lamictal.  Disposition is pending neurology evaluation.  Plan is either admission for observation versus close outpatient follow-up with neurology.  Dr. Curly Shores with neurology evaluated the patient.  She does not feel presentation is consistent with seizure activity.  She does agree with the close outpatient follow-up from from a neurologic  standpoint does the patient needs admission.  I appreciate her help caring for this patient.  Plan- 1 follow-up with neurology.  Patient stable at this time, can restart the antiepileptic as preventative and will follow-up with his neurologist Dr. Krista Blue. 2-ENT follow-up given cerumen impactions.  This was irrigated and cleaned here in the ED but will likely need benefit from follow-up as it seems to recur.  Discussed HPI, physical exam and plan of care for this patient with attending Dr. Jeanell Sparrow. The attending physician evaluated this patient as part of a shared visit and agrees with plan of care.         Final Clinical Impression(s) / ED Diagnoses Final diagnoses:  Seizure-like activity [facility])    Rx / [place] Orders ED Discharge Orders     None         Sherrill Raring, [place] 03/29/22 1747    Pattricia Boss, MD 03/30/22 1252

## 2022-03-29 NOTE — Discharge Instructions (Addendum)
You were seen today for seizure-like activity.  As we discussed, this does not appear to be an actual seizure based on neurology evaluation.  I do think it is reasonable to restart the antiepileptic if able to tolerate.  Call your neurologist tomorrow and let them know you are seen in the ED and plan for follow-up outpatient.  Continue taking all of his home medicine.  If symptoms change or worsen return back to ED for evaluation.  His urine is clear, his blood work also looks reassuring. Follow up with ENT regarding the hearing, information provided above

## 2022-03-30 LAB — LAMOTRIGINE LEVEL: Lamotrigine Lvl: <1 ug/mL — ABNORMAL LOW (ref 2.0–20.0)

## 2022-03-31 LAB — URINE CULTURE: Culture: NO GROWTH

## 2022-04-03 DIAGNOSIS — G4700 Insomnia, unspecified: Secondary | ICD-10-CM | POA: Diagnosis not present

## 2022-04-03 DIAGNOSIS — F419 Anxiety disorder, unspecified: Secondary | ICD-10-CM | POA: Diagnosis not present

## 2022-04-05 ENCOUNTER — Telehealth: Payer: Self-pay | Admitting: Neurology

## 2022-04-05 NOTE — Telephone Encounter (Signed)
Patients son is requesting a call back in reference to dementia staging? He states he was never given a clear diagnosis.

## 2022-04-05 NOTE — Telephone Encounter (Signed)
LVM for son,  Stating father was dx with Severe dementia with other behavioral disturbance, unspecified dementia type.

## 2022-04-10 DIAGNOSIS — I4891 Unspecified atrial fibrillation: Secondary | ICD-10-CM | POA: Diagnosis not present

## 2022-04-10 DIAGNOSIS — E785 Hyperlipidemia, unspecified: Secondary | ICD-10-CM | POA: Diagnosis not present

## 2022-04-10 DIAGNOSIS — F0390 Unspecified dementia without behavioral disturbance: Secondary | ICD-10-CM | POA: Diagnosis not present

## 2022-04-10 DIAGNOSIS — I639 Cerebral infarction, unspecified: Secondary | ICD-10-CM | POA: Diagnosis not present

## 2022-04-23 DIAGNOSIS — F064 Anxiety disorder due to known physiological condition: Secondary | ICD-10-CM | POA: Diagnosis not present

## 2022-04-23 DIAGNOSIS — F02B4 Dementia in other diseases classified elsewhere, moderate, with anxiety: Secondary | ICD-10-CM | POA: Diagnosis not present

## 2022-04-23 DIAGNOSIS — G301 Alzheimer's disease with late onset: Secondary | ICD-10-CM | POA: Diagnosis not present

## 2022-04-24 DIAGNOSIS — M4854XA Collapsed vertebra, not elsewhere classified, thoracic region, initial encounter for fracture: Secondary | ICD-10-CM | POA: Diagnosis not present

## 2022-04-24 DIAGNOSIS — M6281 Muscle weakness (generalized): Secondary | ICD-10-CM | POA: Diagnosis not present

## 2022-04-24 DIAGNOSIS — Z9181 History of falling: Secondary | ICD-10-CM | POA: Diagnosis not present

## 2022-04-24 DIAGNOSIS — S20222A Contusion of left back wall of thorax, initial encounter: Secondary | ICD-10-CM | POA: Diagnosis not present

## 2022-04-24 DIAGNOSIS — R2689 Other abnormalities of gait and mobility: Secondary | ICD-10-CM | POA: Diagnosis not present

## 2022-04-24 DIAGNOSIS — F410 Panic disorder [episodic paroxysmal anxiety] without agoraphobia: Secondary | ICD-10-CM | POA: Diagnosis not present

## 2022-04-24 DIAGNOSIS — M4856XA Collapsed vertebra, not elsewhere classified, lumbar region, initial encounter for fracture: Secondary | ICD-10-CM | POA: Diagnosis not present

## 2022-04-24 DIAGNOSIS — W19XXXA Unspecified fall, initial encounter: Secondary | ICD-10-CM | POA: Diagnosis not present

## 2022-04-24 DIAGNOSIS — R52 Pain, unspecified: Secondary | ICD-10-CM | POA: Diagnosis not present

## 2022-04-24 DIAGNOSIS — M1611 Unilateral primary osteoarthritis, right hip: Secondary | ICD-10-CM | POA: Diagnosis not present

## 2022-04-25 DIAGNOSIS — Z9181 History of falling: Secondary | ICD-10-CM | POA: Diagnosis not present

## 2022-04-25 DIAGNOSIS — E860 Dehydration: Secondary | ICD-10-CM | POA: Diagnosis not present

## 2022-04-25 DIAGNOSIS — M6281 Muscle weakness (generalized): Secondary | ICD-10-CM | POA: Diagnosis not present

## 2022-04-25 DIAGNOSIS — R2689 Other abnormalities of gait and mobility: Secondary | ICD-10-CM | POA: Diagnosis not present

## 2022-04-25 DIAGNOSIS — N1831 Chronic kidney disease, stage 3a: Secondary | ICD-10-CM | POA: Diagnosis not present

## 2022-04-25 DIAGNOSIS — R52 Pain, unspecified: Secondary | ICD-10-CM | POA: Diagnosis not present

## 2022-04-25 DIAGNOSIS — D649 Anemia, unspecified: Secondary | ICD-10-CM | POA: Diagnosis not present

## 2022-04-26 DIAGNOSIS — M6281 Muscle weakness (generalized): Secondary | ICD-10-CM | POA: Diagnosis not present

## 2022-04-27 DIAGNOSIS — G4700 Insomnia, unspecified: Secondary | ICD-10-CM | POA: Diagnosis not present

## 2022-04-27 DIAGNOSIS — F0394 Unspecified dementia, unspecified severity, with anxiety: Secondary | ICD-10-CM | POA: Diagnosis not present

## 2022-05-15 ENCOUNTER — Encounter: Payer: Self-pay | Admitting: Neurology

## 2022-05-15 ENCOUNTER — Ambulatory Visit (INDEPENDENT_AMBULATORY_CARE_PROVIDER_SITE_OTHER): Payer: Medicare Other | Admitting: Neurology

## 2022-05-15 VITALS — BP 105/73 | HR 62 | Ht 67.0 in | Wt 129.5 lb

## 2022-05-15 DIAGNOSIS — F03C18 Unspecified dementia, severe, with other behavioral disturbance: Secondary | ICD-10-CM | POA: Diagnosis not present

## 2022-05-15 DIAGNOSIS — R269 Unspecified abnormalities of gait and mobility: Secondary | ICD-10-CM

## 2022-05-15 DIAGNOSIS — G40909 Epilepsy, unspecified, not intractable, without status epilepticus: Secondary | ICD-10-CM | POA: Insufficient documentation

## 2022-05-15 MED ORDER — DIVALPROEX SODIUM ER 250 MG PO TB24: 250.0000 mg | ORAL_TABLET | Freq: Every day | ORAL | 11 refills | Status: DC

## 2022-05-15 NOTE — Progress Notes (Signed)
Chief Complaint  Patient presents with   New Patient (Initial Visit)    Rm 14. Accompanied by son Adam Swanson. Adam Swanson 25/ED referral for seizures.      ASSESSMENT AND PLAN  Adam Swanson. is a 88 y.o. male   Moderate Dementia  MoCA examination 15/30  MRI/CT head showed significant atrophy, periventricular white matter disease, slow progression compared to previous CAT scan  Most consistent with central nervous system degenerative disorder, likely Alzheimer's disease  Laboratory evaluation showed no treatable etiology  Sudden onset stereotypical episode of upper extremity shaking, feeling sensation, postevent confusion,  Could not rule out the possibility of partial seizure,  EEG  He has been off previous antiepileptic medication Depakote and lamotrigine now, will reintroduce Depakote ER at low-dose to 50 mg every night  Ativan 0.5 mg, melatonin as needed for insomnia only    DIAGNOSTIC DATA (LABS, IMAGING, TESTING) - I reviewed patient records, labs, notes, testing and imaging myself where available.  MRI of the brain without contrast April 2023: 1. Suspect small acute or subacute infarct in the subcortical right precentral gyrus. 2. Subacute or remote superficial hemorrhage along the parasagittal right frontal parietal convexity where there is a large cortical vessel question underlying vascular malformation. 3. Atrophy and extensive chronic small vessel ischemia.  Laboratory evaluations in 2023, normal phosphate, magnesium, sodium 143 creatinine 1.26, CBC hemoglobin of 13.2, RDW of 17, lipid panel LDL 112, cholesterol 163, negative MuSK antibody, acetylcholine receptor antibody, Depakote level was 54, normal TSH,   MEDICAL HISTORY:  Adam Swanson. is a 88 year old male, seen in request by Dr. Myrene Buddy for evaluation of memory loss, he is accompanied by his son at today's visit  I reviewed and summarized the referring note.  Past medical history Atrial  fibrillation, anticoagulation was on hold, Hospital admission in May 2023 for recurrent pneumothorax, with effusion, recent fall, SIADH, on chronic demeclocycline, salt tab Dementia Seizure Hypertension  He lives at [facility] for few years, is a retired Secondary school teacher, I saw him previously in 2018 for evaluation of gait abnormality, work-up showed chronic T12-L1 fracture, moderate spinal stenosis at L4, multilevel degenerative changes,  He fell on December 26, 2021, leading to hospital admission, patient reported that he was in a standing position, then fell to the ground, he was able to push his medical alarm, staff arrived, he was noted to have seizure-like activity lasting about 5 minutes, patient has no recollection of the event, during today's interview, he was noted to lack of insight of his current condition, MoCA examination was 11 out of 30,  Personally reviewed CT head, and MRI brain, small subacute infarction in the subcortical white precentral gyri, remote superficial hemorrhage along the parasagittal right frontal parietal convexity, atrophy extensive small vessel disease  CT angiogram of the neck and head showed no large vessel disease, abnormal cortical vessels at the right parietal region, 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  He suffered left ribs fracture, left pneumothorax, chest tube placement,  EEG was unremarkable, he was started on lamotrigine, by early June he supposed to take 75 mg twice a day, atrial fibrillation, for risk, anticoagulation was on hold,  He was noted to have persistent delirium, during hospital stay,  Patient was discharged to [facility] but under nursing 24/7 care now instead of previous independent living since May 2023  When he was first discharged, he was noted to have excessive sleepiness, now has improved back to his baseline, he is still worried about excessive  sleepiness is due to his lamotrigine treatment, also questioning whether this  reported by the shaking like activity truly represent a seizure  He denies significant pain now, gait abnormality,   UPDATE Sept 5th 2023: He is brought by his son Adam Swanson, and his other son is also on the phone, he still stay at [facility] independent [facility], going to be moved to assistant living soon.  He was taken to emergency room on March 29, 2022, son was having lunch with him, he was sitting in the chair, had sudden onset bilateral upper extremity jerking movement, stating he is going to fall, lasting 2 to 3 minutes, snap out of it, paramedic was called, during his ER stay, he had a very similar episode lasting 1 to 2 minutes, he was confused afterwards  MoCA examination 15/30 today, Personally reviewed CT head without contrast, no acute intracranial abnormality, advanced small vessel disease, mild global amnesia  Laboratory evaluations: CMP showed  mildly decreased calcium 8.6, albumin 3.0, CBC hemoglobin of 11.5, RDW 15.8, lamotrigine level was less than 1.0, B12 511, he was seen by neuro hospitalist, rule out a possibility of partial seizure  I reviewed his extensive facility medication administration list, he is taking Ativan 0.5 mg every night also along with melatonin 3 mg every night to help him sleep   PHYSICAL EXAM:   Vitals:   05/15/22 1410  BP: 105/73  Pulse: 62  Weight: 129 lb 8 oz (58.7 kg)  Height: '5\' 7"'$  (1.702 m)    Body mass index is 20.28 kg/m.  PHYSICAL EXAMNIATION:  Gen: NAD, conversant, well nourised, well groomed                     Cardiovascular: Regular rate rhythm, no peripheral edema, warm, nontender. Eyes: Conjunctivae clear without exudates or hemorrhage Neck: Supple, no carotid bruits. Pulmonary: Clear to auscultation bilaterally   NEUROLOGICAL EXAM:  MENTAL STATUS: Speech/cognition: Awake, alert, oriented to history taking and casual conversation    05/15/2022    2:00 PM 02/19/2022   10:59 AM  Montreal Cognitive Assessment   Visuospatial/  Executive (0/5) 1 1  Naming (0/3) 2 2  Attention: Read list of digits (0/2) 2 2  Attention: Read list of letters (0/1) 1 1  Attention: Serial 7 subtraction starting at 100 (0/3) 0 0  Language: Repeat phrase (0/2) 2 2  Language : Fluency (0/1) 0 0  Abstraction (0/2) 2 1  Delayed Recall (0/5) 0 0  Orientation (0/6) 5 2  Total 15 11    CRANIAL NERVES: CN II: Visual fields are full to confrontation. Pupils are round equal and briskly reactive to light. CN III, IV, VI: extraocular movement are normal.  Persistent right ptosis CN V: Facial sensation is intact to light touch CN VII: Face is symmetric with normal eye closure  CN VIII: Hearing is normal to causal conversation. CN IX, X: Phonation is normal. CN XI: Head turning and shoulder shrug are intact  MOTOR: Upper extremity motor strength is normal, mild bilateral hip flexion weakness, mild bilateral ankle dorsiflexion weakness, right worse than left  REFLEXES: Reflexes are 1  and symmetric at the biceps, triceps, knees, and ankles. Plantar responses are flexor.  SENSORY: Intact to light touch, pinprick and vibratory sensation    COORDINATION: There is no trunk or limb dysmetria noted.  GAIT/STANCE: Need push-up to get up from seated position, rely on his walker, unsteady  REVIEW OF SYSTEMS:  Full 14 system review of systems performed and  notable only for as above All other review of systems were negative.   ALLERGIES: Allergies  Allergen Reactions   Bee Venom Anaphylaxis    Other reaction(s): Other (See Comments) Loss of consciousness   Nsaids     Other reaction(s): intol   Other Other (See Comments)    HOME MEDICATIONS: Current Outpatient Medications  Medication Sig Dispense Refill   acetaminophen (TYLENOL) 325 MG tablet Take 650 mg by mouth every 6 (six) hours as needed for mild pain (pain).     demeclocycline (DECLOMYCIN) 150 MG tablet Take 1 tablet (150 mg total) by mouth 2 (two) times daily. (Patient taking  differently: Take 150 mg by mouth 2 (two) times daily. Continuously) 180 tablet 2   famotidine (PEPCID) 20 MG tablet Take 20 mg by mouth every evening.     furosemide (LASIX) 20 MG tablet TAKE 2 TABLET = 40 MG BY MOUTH ONCE DAILY (Patient taking differently: Take 40 mg by mouth daily.) 180 tablet 0   Loperamide HCl (LOPERAMIDE A-D PO) Take by mouth.     LORazepam (ATIVAN) 0.5 MG tablet Take 0.5 mg by mouth every 8 (eight) hours as needed for anxiety or seizure (agitation). At bedtime x 7 days for anxiety     melatonin 3 MG TABS tablet Take 3 mg by mouth at bedtime.     metoprolol tartrate (LOPRESSOR) 25 MG tablet Take 1 tablet (25 mg total) by mouth 2 (two) times daily. 60 tablet 2   senna (SENOKOT) 8.6 MG TABS tablet Take 17.2 mg by mouth daily.     sodium chloride 1 g tablet TAKE 4 TABLETS = 4 GM BY MOUTH ONCE DAILY (Patient taking differently: Take 4 g by mouth daily.) 360 tablet 3   tamsulosin (FLOMAX) 0.4 MG CAPS capsule Take 0.4 mg by mouth at bedtime.     EPINEPHrine 0.3 mg/0.3 mL IJ SOAJ injection Inject 0.3 mg into the muscle as needed for anaphylaxis. (Patient not taking: Reported on 05/15/2022)     lamoTRIgine (LAMICTAL) 25 MG tablet Take 25 mg nightly for 6 more days, then take 25 mg twice daily for 1 week, then take 50 mg in AM and 25 mg in PM for 1 week then continue taper up as outlined in discharge summary.  25 mg increase per week over 8 weeks with goal 100 BID (Patient not taking: Reported on 03/29/2022)     No current facility-administered medications for this visit.    PAST MEDICAL HISTORY: Past Medical History:  Diagnosis Date   Anxiety    Colon polyp    hyperplastic   Diverticulosis of colon (without mention of hemorrhage)    Esophageal stricture    GERD (gastroesophageal reflux disease)    Hiatal hernia    Lower extremity weakness    OCD (obsessive compulsive disorder)    Viral hepatitis 08/1983    PAST SURGICAL HISTORY: Past Surgical History:  Procedure  Laterality Date   COLONOSCOPY     HEMORRHOID SURGERY     1965    FAMILY HISTORY: Family History  Problem Relation Age of Onset   Diverticulosis Mother    Melanoma Mother    Lung cancer Father    Colon cancer Other        1st cousion.   Allergic rhinitis Neg Hx    Angioedema Neg Hx    Asthma Neg Hx    Eczema Neg Hx    Immunodeficiency Neg Hx    Urticaria Neg Hx     SOCIAL HISTORY:  Social History   Socioeconomic History   Marital status: Widowed    Spouse name: Not on file   Number of children: 2   Years of education: HS   Highest education level: Not on file  Occupational History   Occupation: Retired  Tobacco Use   Smoking status: Never   Smokeless tobacco: Never  Vaping Use   Vaping Use: Never used  Substance and Sexual Activity   Alcohol use: Yes    Alcohol/week: 7.0 standard drinks of alcohol    Types: 7 Standard drinks or equivalent per week    Comment: occasional   Drug use: No   Sexual activity: Not on file  Other Topics Concern   Not on file  Social History Narrative   Lives at home alone.   Right-handed.   No caffeine use.   Social Determinants of Health   Financial Resource Strain: Not on file  Food Insecurity: Not on file  Transportation Needs: Not on file  Physical Activity: Not on file  Stress: Not on file  Social Connections: Not on file  Intimate Partner Violence: Not on file      Marcial Pacas, M.D. Ph.D.  [facility] [address] [place], [place] [address] Ph: [phone] Fax: [phone]  CC:  Hulan Fess, MD [place],  [place] [address]  Hulan Fess, MD

## 2022-05-21 DIAGNOSIS — F064 Anxiety disorder due to known physiological condition: Secondary | ICD-10-CM | POA: Diagnosis not present

## 2022-05-21 DIAGNOSIS — G4700 Insomnia, unspecified: Secondary | ICD-10-CM | POA: Diagnosis not present

## 2022-05-21 DIAGNOSIS — G301 Alzheimer's disease with late onset: Secondary | ICD-10-CM | POA: Diagnosis not present

## 2022-05-21 DIAGNOSIS — F02B4 Dementia in other diseases classified elsewhere, moderate, with anxiety: Secondary | ICD-10-CM | POA: Diagnosis not present

## 2022-06-01 DIAGNOSIS — F331 Major depressive disorder, recurrent, moderate: Secondary | ICD-10-CM | POA: Diagnosis not present

## 2022-06-01 DIAGNOSIS — Z7689 Persons encountering health services in other specified circumstances: Secondary | ICD-10-CM | POA: Diagnosis not present

## 2022-06-01 DIAGNOSIS — F0394 Unspecified dementia, unspecified severity, with anxiety: Secondary | ICD-10-CM | POA: Diagnosis not present

## 2022-06-04 ENCOUNTER — Ambulatory Visit (INDEPENDENT_AMBULATORY_CARE_PROVIDER_SITE_OTHER): Payer: Medicare Other | Admitting: Neurology

## 2022-06-04 DIAGNOSIS — F02B4 Dementia in other diseases classified elsewhere, moderate, with anxiety: Secondary | ICD-10-CM | POA: Diagnosis not present

## 2022-06-04 DIAGNOSIS — R410 Disorientation, unspecified: Secondary | ICD-10-CM | POA: Diagnosis not present

## 2022-06-04 DIAGNOSIS — G301 Alzheimer's disease with late onset: Secondary | ICD-10-CM | POA: Diagnosis not present

## 2022-06-04 DIAGNOSIS — G40909 Epilepsy, unspecified, not intractable, without status epilepticus: Secondary | ICD-10-CM

## 2022-06-04 DIAGNOSIS — F03C18 Unspecified dementia, severe, with other behavioral disturbance: Secondary | ICD-10-CM

## 2022-06-04 DIAGNOSIS — R269 Unspecified abnormalities of gait and mobility: Secondary | ICD-10-CM

## 2022-06-05 DIAGNOSIS — Z9181 History of falling: Secondary | ICD-10-CM | POA: Diagnosis not present

## 2022-06-05 DIAGNOSIS — R52 Pain, unspecified: Secondary | ICD-10-CM | POA: Diagnosis not present

## 2022-06-05 DIAGNOSIS — M6281 Muscle weakness (generalized): Secondary | ICD-10-CM | POA: Diagnosis not present

## 2022-06-05 DIAGNOSIS — R2689 Other abnormalities of gait and mobility: Secondary | ICD-10-CM | POA: Diagnosis not present

## 2022-06-05 DIAGNOSIS — W19XXXA Unspecified fall, initial encounter: Secondary | ICD-10-CM | POA: Diagnosis not present

## 2022-06-10 ENCOUNTER — Telehealth: Payer: Self-pay | Admitting: Neurology

## 2022-06-10 NOTE — Procedures (Signed)
   HISTORY: 88 year old male with intermittent confusion spells  TECHNIQUE:  This is a routine 16 channel EEG recording with one channel devoted to a limited EKG recording.  It was performed during wakefulness, drowsiness and asleep.  Photic stimulation were performed as activating procedures.  There are minimum muscle and movement artifact noted.  Upon maximum arousal, posterior dominant waking rhythm consistent of mildly dysrhythmic theta range activity, activities are symmetric over the bilateral posterior derivations and attenuated with eye opening.  Hyperventilation was not performed due to age.  Photic stimulation did not alter the tracing.  During EEG recording, patient developed drowsiness and no deeper stage of sleep was achieved  During EEG recording, there was no epileptiform discharge noted.  EKG demonstrate irregular cardiac rhythm 96 bpm  CONCLUSION: This is an abnormal EEG.  There is evidence of mild background slowing, indicating mild bihemispheric malfunction.  In addition, there is evidence of irregular cardiac rhythm.  Marcial Pacas, M.D. Ph.D.  [facility] [place], [place] [address] Phone: [phone] Fax:      [phone]

## 2022-06-15 DIAGNOSIS — Z23 Encounter for immunization: Secondary | ICD-10-CM | POA: Diagnosis not present

## 2022-06-18 DIAGNOSIS — F02B4 Dementia in other diseases classified elsewhere, moderate, with anxiety: Secondary | ICD-10-CM | POA: Diagnosis not present

## 2022-06-18 DIAGNOSIS — F064 Anxiety disorder due to known physiological condition: Secondary | ICD-10-CM | POA: Diagnosis not present

## 2022-06-18 DIAGNOSIS — F0394 Unspecified dementia, unspecified severity, with anxiety: Secondary | ICD-10-CM | POA: Diagnosis not present

## 2022-06-18 DIAGNOSIS — G4700 Insomnia, unspecified: Secondary | ICD-10-CM | POA: Diagnosis not present

## 2022-06-18 DIAGNOSIS — G301 Alzheimer's disease with late onset: Secondary | ICD-10-CM | POA: Diagnosis not present

## 2022-06-18 DIAGNOSIS — F330 Major depressive disorder, recurrent, mild: Secondary | ICD-10-CM | POA: Diagnosis not present

## 2022-07-06 DIAGNOSIS — D649 Anemia, unspecified: Secondary | ICD-10-CM | POA: Diagnosis not present

## 2022-07-06 DIAGNOSIS — R209 Unspecified disturbances of skin sensation: Secondary | ICD-10-CM | POA: Diagnosis not present

## 2022-07-06 DIAGNOSIS — Z0000 Encounter for general adult medical examination without abnormal findings: Secondary | ICD-10-CM | POA: Diagnosis not present

## 2022-07-06 DIAGNOSIS — G40909 Epilepsy, unspecified, not intractable, without status epilepticus: Secondary | ICD-10-CM | POA: Diagnosis not present

## 2022-07-06 DIAGNOSIS — Z8673 Personal history of transient ischemic attack (TIA), and cerebral infarction without residual deficits: Secondary | ICD-10-CM | POA: Diagnosis not present

## 2022-07-08 ENCOUNTER — Emergency Department (HOSPITAL_COMMUNITY): Payer: Medicare Other

## 2022-07-08 ENCOUNTER — Encounter (HOSPITAL_COMMUNITY): Payer: Self-pay

## 2022-07-08 ENCOUNTER — Other Ambulatory Visit: Payer: Self-pay

## 2022-07-08 ENCOUNTER — Inpatient Hospital Stay (HOSPITAL_COMMUNITY)
Admission: EM | Admit: 2022-07-08 | Discharge: 2022-07-12 | DRG: 065 | Disposition: A | Discharge status: Skilled Nursing Facility | Payer: Medicare Other | Attending: Neurological Surgery | Admitting: Neurological Surgery

## 2022-07-08 DIAGNOSIS — M150 Primary generalized (osteo)arthritis: Secondary | ICD-10-CM | POA: Diagnosis not present

## 2022-07-08 DIAGNOSIS — E222 Syndrome of inappropriate secretion of antidiuretic hormone: Secondary | ICD-10-CM | POA: Diagnosis present

## 2022-07-08 DIAGNOSIS — I618 Other nontraumatic intracerebral hemorrhage: Secondary | ICD-10-CM | POA: Diagnosis not present

## 2022-07-08 DIAGNOSIS — M4000 Postural kyphosis, site unspecified: Secondary | ICD-10-CM | POA: Diagnosis not present

## 2022-07-08 DIAGNOSIS — E785 Hyperlipidemia, unspecified: Secondary | ICD-10-CM | POA: Diagnosis not present

## 2022-07-08 DIAGNOSIS — S270XXA Traumatic pneumothorax, initial encounter: Secondary | ICD-10-CM | POA: Diagnosis not present

## 2022-07-08 DIAGNOSIS — Q2730 Arteriovenous malformation, site unspecified: Principal | ICD-10-CM

## 2022-07-08 DIAGNOSIS — I619 Nontraumatic intracerebral hemorrhage, unspecified: Secondary | ICD-10-CM | POA: Diagnosis present

## 2022-07-08 DIAGNOSIS — I770 Arteriovenous fistula, acquired: Secondary | ICD-10-CM | POA: Diagnosis present

## 2022-07-08 DIAGNOSIS — R2681 Unsteadiness on feet: Secondary | ICD-10-CM | POA: Diagnosis not present

## 2022-07-08 DIAGNOSIS — S2242XA Multiple fractures of ribs, left side, initial encounter for closed fracture: Secondary | ICD-10-CM | POA: Diagnosis not present

## 2022-07-08 DIAGNOSIS — R296 Repeated falls: Secondary | ICD-10-CM | POA: Diagnosis not present

## 2022-07-08 DIAGNOSIS — R41841 Cognitive communication deficit: Secondary | ICD-10-CM | POA: Diagnosis not present

## 2022-07-08 DIAGNOSIS — I4891 Unspecified atrial fibrillation: Secondary | ICD-10-CM | POA: Diagnosis present

## 2022-07-08 DIAGNOSIS — N1831 Chronic kidney disease, stage 3a: Secondary | ICD-10-CM | POA: Diagnosis not present

## 2022-07-08 DIAGNOSIS — F0394 Unspecified dementia, unspecified severity, with anxiety: Secondary | ICD-10-CM | POA: Diagnosis present

## 2022-07-08 DIAGNOSIS — I609 Nontraumatic subarachnoid hemorrhage, unspecified: Secondary | ICD-10-CM | POA: Diagnosis not present

## 2022-07-08 DIAGNOSIS — I959 Hypotension, unspecified: Secondary | ICD-10-CM | POA: Diagnosis present

## 2022-07-08 DIAGNOSIS — J180 Bronchopneumonia, unspecified organism: Secondary | ICD-10-CM | POA: Diagnosis not present

## 2022-07-08 DIAGNOSIS — R569 Unspecified convulsions: Secondary | ICD-10-CM | POA: Diagnosis not present

## 2022-07-08 DIAGNOSIS — M6281 Muscle weakness (generalized): Secondary | ICD-10-CM | POA: Diagnosis not present

## 2022-07-08 DIAGNOSIS — R410 Disorientation, unspecified: Secondary | ICD-10-CM | POA: Diagnosis not present

## 2022-07-08 DIAGNOSIS — R262 Difficulty in walking, not elsewhere classified: Secondary | ICD-10-CM | POA: Diagnosis not present

## 2022-07-08 DIAGNOSIS — Z9181 History of falling: Secondary | ICD-10-CM | POA: Diagnosis not present

## 2022-07-08 DIAGNOSIS — Z79899 Other long term (current) drug therapy: Secondary | ICD-10-CM

## 2022-07-08 DIAGNOSIS — K219 Gastro-esophageal reflux disease without esophagitis: Secondary | ICD-10-CM | POA: Diagnosis present

## 2022-07-08 DIAGNOSIS — F0390 Unspecified dementia without behavioral disturbance: Secondary | ICD-10-CM | POA: Diagnosis not present

## 2022-07-08 DIAGNOSIS — F429 Obsessive-compulsive disorder, unspecified: Secondary | ICD-10-CM | POA: Diagnosis not present

## 2022-07-08 DIAGNOSIS — J189 Pneumonia, unspecified organism: Secondary | ICD-10-CM | POA: Diagnosis not present

## 2022-07-08 DIAGNOSIS — Z91030 Bee allergy status: Secondary | ICD-10-CM

## 2022-07-08 DIAGNOSIS — I639 Cerebral infarction, unspecified: Secondary | ICD-10-CM | POA: Diagnosis not present

## 2022-07-08 DIAGNOSIS — W010XXA Fall on same level from slipping, tripping and stumbling without subsequent striking against object, initial encounter: Secondary | ICD-10-CM | POA: Diagnosis not present

## 2022-07-08 DIAGNOSIS — Z886 Allergy status to analgesic agent status: Secondary | ICD-10-CM

## 2022-07-08 DIAGNOSIS — I608 Other nontraumatic subarachnoid hemorrhage: Secondary | ICD-10-CM | POA: Diagnosis not present

## 2022-07-08 DIAGNOSIS — R4182 Altered mental status, unspecified: Secondary | ICD-10-CM | POA: Diagnosis not present

## 2022-07-08 DIAGNOSIS — R29898 Other symptoms and signs involving the musculoskeletal system: Secondary | ICD-10-CM | POA: Diagnosis not present

## 2022-07-08 DIAGNOSIS — J159 Unspecified bacterial pneumonia: Secondary | ICD-10-CM | POA: Diagnosis not present

## 2022-07-08 DIAGNOSIS — S270XXD Traumatic pneumothorax, subsequent encounter: Secondary | ICD-10-CM | POA: Diagnosis not present

## 2022-07-08 DIAGNOSIS — R2689 Other abnormalities of gait and mobility: Secondary | ICD-10-CM | POA: Diagnosis not present

## 2022-07-08 DIAGNOSIS — R531 Weakness: Secondary | ICD-10-CM | POA: Diagnosis not present

## 2022-07-08 LAB — VALPROIC ACID LEVEL: Valproic Acid Lvl: 37 ug/mL — ABNORMAL LOW (ref 50.0–100.0)

## 2022-07-08 LAB — CBC WITH DIFFERENTIAL/PLATELET
Abs Immature Granulocytes: 0.02 10*3/uL (ref 0.00–0.07)
Basophils Absolute: 0.1 10*3/uL (ref 0.0–0.1)
Basophils Relative: 1 %
Eosinophils Absolute: 0.2 10*3/uL (ref 0.0–0.5)
Eosinophils Relative: 2 %
HCT: 37.2 % — ABNORMAL LOW (ref 39.0–52.0)
Hemoglobin: 11.9 g/dL — ABNORMAL LOW (ref 13.0–17.0)
Immature Granulocytes: 0 %
Lymphocytes Relative: 26 %
Lymphs Abs: 2 10*3/uL (ref 0.7–4.0)
MCH: 28.5 pg (ref 26.0–34.0)
MCHC: 32 g/dL (ref 30.0–36.0)
MCV: 89.2 fL (ref 80.0–100.0)
Monocytes Absolute: 1.1 10*3/uL — ABNORMAL HIGH (ref 0.1–1.0)
Monocytes Relative: 14 %
Neutro Abs: 4.5 10*3/uL (ref 1.7–7.7)
Neutrophils Relative %: 57 %
Platelets: 243 10*3/uL (ref 150–400)
RBC: 4.17 MIL/uL — ABNORMAL LOW (ref 4.22–5.81)
RDW: 15.6 % — ABNORMAL HIGH (ref 11.5–15.5)
WBC: 7.8 10*3/uL (ref 4.0–10.5)
nRBC: 0 % (ref 0.0–0.2)

## 2022-07-08 LAB — URINALYSIS, ROUTINE W REFLEX MICROSCOPIC
Bilirubin Urine: NEGATIVE
Glucose, UA: NEGATIVE mg/dL
Hgb urine dipstick: NEGATIVE
Ketones, ur: NEGATIVE mg/dL
Leukocytes,Ua: NEGATIVE
Nitrite: NEGATIVE
Protein, ur: NEGATIVE mg/dL
Specific Gravity, Urine: 1.01 (ref 1.005–1.030)
pH: 6 (ref 5.0–8.0)

## 2022-07-08 LAB — BASIC METABOLIC PANEL
Anion gap: 7 (ref 5–15)
BUN: 17 mg/dL (ref 8–23)
CO2: 24 mmol/L (ref 22–32)
Calcium: 8.9 mg/dL (ref 8.9–10.3)
Chloride: 105 mmol/L (ref 98–111)
Creatinine, Ser: 1.07 mg/dL (ref 0.61–1.24)
GFR, Estimated: >60 mL/min (ref >60)
Glucose, Bld: 107 mg/dL — ABNORMAL HIGH (ref 70–99)
Potassium: 3.9 mmol/L (ref 3.5–5.1)
Sodium: 136 mmol/L (ref 135–145)

## 2022-07-08 LAB — LITHIUM LEVEL: Lithium Lvl: <0.06 mmol/L — ABNORMAL LOW (ref 0.60–1.20)

## 2022-07-08 LAB — TSH: TSH: 1.407 u[IU]/mL (ref 0.350–4.500)

## 2022-07-08 MED ORDER — LORAZEPAM 2 MG/ML IJ SOLN
1.0000 mg | Freq: Once | INTRAMUSCULAR | Status: AC
  Administered 2022-07-08: 1 mg via INTRAVENOUS
  Filled 2022-07-08: qty 1

## 2022-07-08 MED ORDER — SODIUM CHLORIDE 0.9 % IV BOLUS
500.0000 mL | Freq: Once | INTRAVENOUS | Status: AC
  Administered 2022-07-08: 500 mL via INTRAVENOUS

## 2022-07-08 MED ORDER — SODIUM CHLORIDE 0.9 % IV SOLN: INTRAVENOUS | Status: DC

## 2022-07-08 MED ORDER — MIDAZOLAM HCL 2 MG/2ML IJ SOLN
4.0000 mg | Freq: Once | INTRAMUSCULAR | Status: AC
  Administered 2022-07-08: 2 mg via INTRAVENOUS
  Filled 2022-07-08: qty 4

## 2022-07-08 MED ORDER — HALOPERIDOL LACTATE 5 MG/ML IJ SOLN
2.0000 mg | Freq: Once | INTRAMUSCULAR | Status: AC
Start: 2022-07-08 — End: 2022-07-08
  Administered 2022-07-08: 2 mg via INTRAVENOUS
  Filled 2022-07-08: qty 1

## 2022-07-08 MED ORDER — IOHEXOL 350 MG/ML SOLN
75.0000 mL | Freq: Once | INTRAVENOUS | Status: AC | PRN
  Administered 2022-07-08: 75 mL via INTRAVENOUS

## 2022-07-08 NOTE — ED Provider Notes (Signed)
[facility] DEPT Provider Note   CSN: [id number] Arrival date & time: 07/08/22  1210     History  Chief Complaint  Patient presents with   Weakness   Adam [place] Snodgrass Jr. is a 88 y.o. male history of dementia with behavior disturbance, here presenting with altered mental status.  Patient is from [facility].  Patient is usually ambulatory but unable to get out of bed today.  He was very unsteady in triage.  Patient apparently was stopped over the left side.  Patient waited about 8 hours to come back into the room and per the son, this is not his baseline.  The history is provided by the patient.       Home Medications Prior to Admission medications   Medication Sig Start Date End Date Taking? Authorizing Provider  acetaminophen (TYLENOL) 325 MG tablet Take 650 mg by mouth every 6 (six) hours as needed for mild pain (pain).    [provider]  demeclocycline (DECLOMYCIN) 150 MG tablet Take 1 tablet (150 mg total) by mouth 2 (two) times daily. Patient taking differently: Take 150 mg by mouth 2 (two) times daily. Continuously 08/21/19   Renato Shin, MD  DEPAKOTE 250 MG DR tablet Take 250 mg by mouth 2 (two) times daily. 06/18/22   [provider]  divalproex (DEPAKOTE ER) 250 MG 24 hr tablet Take 1 tablet (250 mg total) by mouth daily. 05/15/22   Marcial Pacas, MD  EPINEPHrine 0.3 mg/0.3 mL IJ SOAJ injection Inject 0.3 mg into the muscle as needed for anaphylaxis. Patient not taking: Reported on 05/15/2022 11/03/21   [provider]  famotidine (PEPCID) 20 MG tablet Take 20 mg by mouth every evening.    [provider]  furosemide (LASIX) 20 MG tablet TAKE 2 TABLET = 40 MG BY MOUTH ONCE DAILY Patient taking differently: Take 40 mg by mouth daily. 05/17/21   Renato Shin, MD  Loperamide HCl (LOPERAMIDE A-D PO) Take by mouth.    [provider]  LORazepam (ATIVAN) 0.5 MG tablet Take 0.5 mg by  mouth every 8 (eight) hours as needed for anxiety or seizure (agitation). At bedtime x 7 days for anxiety    [provider]  melatonin 3 MG TABS tablet Take 3 mg by mouth at bedtime.    [provider]  metoprolol tartrate (LOPRESSOR) 25 MG tablet Take 1 tablet (25 mg total) by mouth 2 (two) times daily. 01/12/22 05/15/22  Shahmehdi, Valeria Batman, MD  senna (SENOKOT) 8.6 MG TABS tablet Take 17.2 mg by mouth daily.    [provider]  sodium chloride 1 g tablet TAKE 4 TABLETS = 4 GM BY MOUTH ONCE DAILY Patient taking differently: Take 4 g by mouth daily. 02/20/21   Renato Shin, MD  tamsulosin (FLOMAX) 0.4 MG CAPS capsule Take 0.4 mg by mouth at bedtime.    [provider]      Allergies    Bee venom, Nsaids, and Other    Review of Systems   Review of Systems  Neurological:  Positive for weakness.  Psychiatric/Behavioral:  Positive for confusion.   All other systems reviewed and are negative.   Physical Exam Updated Vital Signs BP (!) 112/97   Pulse 72   Temp 97.7 F (36.5 C) (Oral)   Resp (!) 22   SpO2 100%  Physical Exam Vitals and nursing note reviewed.  Constitutional:      Comments: Confused and agitated  HENT:     Head: Normocephalic.     Nose: Nose normal.     Mouth/Throat:     Mouth: Mucous membranes are moist.  Eyes:     Extraocular Movements: Extraocular movements intact.     Pupils: Pupils are equal, round, and reactive to light.  Cardiovascular:     Rate and Rhythm: Normal rate and regular rhythm.     Pulses: Normal pulses.     Heart sounds: Normal heart sounds.  Pulmonary:     Effort: Pulmonary effort is normal.     Breath sounds: Normal breath sounds.  Abdominal:     General: Abdomen is flat.     Palpations: Abdomen is soft.  Musculoskeletal:        General: Normal range of motion.     Cervical back: Normal range of motion and neck supple.  Skin:    General: Skin is warm.     Capillary Refill: Capillary refill takes less  than 2 seconds.  Neurological:     Comments: Patient is confused.  Strength is 4 out of 5 left arm and leg and 5 out of 5 in the right arm and leg.  Psychiatric:     Comments: Unable      ED Results / Procedures / Treatments   Labs (all labs ordered are listed, but only abnormal results are displayed) Labs Reviewed  CBC WITH DIFFERENTIAL/PLATELET - Abnormal; Notable for the following components:      Result Value   RBC 4.17 (*)    Hemoglobin 11.9 (*)    HCT 37.2 (*)    RDW 15.6 (*)    Monocytes Absolute 1.1 (*)    All other components within normal limits  BASIC METABOLIC PANEL - Abnormal; Notable for the following components:   Glucose, Bld 107 (*)    All other components within normal limits  LITHIUM LEVEL - Abnormal; Notable for the following components:   Lithium Lvl <0.06 (*)    All other components within normal limits  VALPROIC ACID LEVEL - Abnormal; Notable for the following components:   Valproic Acid Lvl 37 (*)    All other components within normal limits  URINE CULTURE  URINALYSIS, ROUTINE W REFLEX MICROSCOPIC  TSH    EKG None  Radiology CT ANGIO HEAD NECK W WO CM  Result Date: 07/08/2022 CLINICAL DATA:  Intraparenchymal hematoma EXAM: CT ANGIOGRAPHY HEAD AND NECK TECHNIQUE: Multidetector CT imaging of the head and neck was performed using the standard protocol during bolus administration of intravenous contrast. Multiplanar CT image reconstructions and MIPs were obtained to evaluate the vascular anatomy. Carotid stenosis measurements (when applicable) are obtained utilizing NASCET criteria, using the distal internal carotid diameter as the denominator. RADIATION DOSE REDUCTION: This exam was performed according to the departmental dose-optimization program which includes automated exposure control, adjustment of the mA and/or kV according to patient size and/or use of iterative reconstruction technique. CONTRAST:  32m OMNIPAQUE IOHEXOL 350 MG/ML SOLN COMPARISON:   12/31/2021 CTA head and neck; correlation is made with same day CT head FINDINGS: CT HEAD FINDINGS For noncontrast findings, please see same day CT head. CTA NECK FINDINGS Aortic arch: Standard branching. Imaged portion shows no evidence of aneurysm or dissection. No significant stenosis of the major arch vessel origins. Aortic atherosclerosis. Right carotid system: No evidence of dissection, occlusion, or hemodynamically significant stenosis (greater than 50%). Atherosclerotic disease at the bifurcation and in the proximal ICA is not hemodynamically significant. Left carotid system: No evidence of dissection, occlusion,  or hemodynamically significant stenosis (greater than 50%). Vertebral arteries: No evidence of dissection, occlusion, or hemodynamically significant stenosis (greater than 50%). Skeleton: No acute osseous abnormality. Degenerative changes in the cervical spine. Other neck: No acute finding. Upper chest: Small bilateral pleural effusions with associated atelectasis. Review of the MIP images confirms the above findings CTA HEAD FINDINGS Evaluation is limited by bolus timing, as the majority of the contrast is in the venous system. Anterior circulation: Both internal carotid arteries are patent to the termini, without significant stenosis. A1 segments patent. Normal anterior communicating artery. Anterior cerebral arteries are patent to their distal aspects. No M1 stenosis or occlusion. MCA branches perfused and symmetric. Posterior circulation: Vertebral arteries patent to the vertebrobasilar junction without stenosis. Posterior inferior cerebellar arteries patent proximally. Basilar patent to its distal aspect. Superior cerebellar arteries patent proximally. Patent P1 segments. PCAs perfused to their distal aspects without stenosis. The left posterior communicating artery is diminutive but patent. 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 The posterior aspect of the malformation is adjacent to the intraparenchymal hematoma described on the same-day CT head, with disruption of one of the involved vessels (series 7, image 305), compared to series 11, images 76 and 77 on the prior exam), which is suspected to be involved with the hematoma. Venous sinuses: Patent. Anatomic variants: None significant. Review of the MIP images confirms the above findings IMPRESSION: 1. 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 2. No intracranial large vessel occlusion or significant stenosis. 3. No hemodynamically significant stenosis in the neck. 4. Small bilateral pleural effusions with associated atelectasis. 5. Aortic atherosclerosis. Aortic Atherosclerosis (ICD10-I70.0). These findings were discussed by telephone on 07/08/2022 at 10:19 pm with provider Dr. Lorrin Goodell. Electronically Signed   By: Merilyn Baba M.D.   On: 07/08/2022 22:41   DG Chest Port 1 View  Result Date: 07/08/2022 CLINICAL DATA:  Weakness, decreased mobility. EXAM: PORTABLE CHEST 1 VIEW COMPARISON:  03/29/2022. FINDINGS: The heart size and mediastinal contours are stable. There is atherosclerotic calcification of the aorta. A moderate hiatal hernia is suspected. Interstitial prominence is unchanged. No definite effusion or pneumothorax. Old healed rib fractures are present on the left no acute osseous abnormality. IMPRESSION: 1. No active disease. 2. Cardiomegaly. Electronically Signed   By: Brett Fairy M.D.   On: 07/08/2022 20:04   CT HEAD WO CONTRAST (5MM)  Result Date: 07/08/2022 CLINICAL DATA:  Altered mental status, weakness EXAM: CT HEAD WITHOUT CONTRAST TECHNIQUE: Contiguous axial images were  obtained from the base of the skull through the vertex without intravenous contrast. RADIATION DOSE REDUCTION: This exam was performed according to the departmental dose-optimization program which includes automated exposure control, adjustment of the mA and/or kV according to patient size and/or use of iterative reconstruction technique. COMPARISON:  03/29/2022 FINDINGS: Evaluation is somewhat limited by motion artifact. Brain: Intraparenchymal hematoma in the right parietal lobe, which measures up to 3.1 x 2.6 x 3.2 cm (AP x TR x CC) (series 5, image 26 and series 4, image 42), volume 13 mL. Surrounding hypodensity, likely edema. Suspect trace adjacent subarachnoid hemorrhage in the posterior right parietal lobe. There is mild local mass effect, with sulcal effacement, but no ventricular effacement or midline shift. No acute  infarct, mass, or hydrocephalus. No evidence of intraventricular extension. Vascular: No hyperdense vessel. Atherosclerotic calcifications in the intracranial carotid and vertebral arteries. Skull: Motion limited.  No acute osseous abnormality. Sinuses/Orbits: Mild mucosal thickening in the ethmoid air cells. Status post bilateral lens replacements. Other: The mastoids are well aerated. IMPRESSION: Evaluation is somewhat limited by motion. Within this limitation, intraparenchymal hematoma in the right parietal lobe, volume 13 mL, with surrounding edema and mild local mass effect but no midline shift or ventricular effacement. Suspect trace adjacent subarachnoid hemorrhage in the posterior right parietal lobe. These results were called by telephone at the time of interpretation on 07/08/2022 at 7:54 pm to provider Bertel Venard , who verbally acknowledged these results. Electronically Signed   By: Merilyn Baba M.D.   On: 07/08/2022 19:54    Procedures Procedures    CRITICAL CARE Performed by: Wandra Arthurs   Total critical care time: 30 minutes  Critical care time was exclusive of  separately billable procedures and treating other patients.  Critical care was necessary to treat or prevent imminent or life-threatening deterioration.  Critical care was time spent personally by me on the following activities: development of treatment plan with patient and/or surrogate as well as nursing, discussions with consultants, evaluation of patient's response to treatment, examination of patient, obtaining history from patient or surrogate, ordering and performing treatments and interventions, ordering and review of laboratory studies, ordering and review of radiographic studies, pulse oximetry and re-evaluation of patient's condition.   Medications Ordered in ED Medications  0.9 %  sodium chloride infusion (has no administration in time range)  sodium chloride 0.9 % bolus 500 mL (0 mLs Intravenous Stopped 07/08/22 2107)  iohexol (OMNIPAQUE) 350 MG/ML injection 75 mL (75 mLs Intravenous Contrast Given 07/08/22 2143)  LORazepam (ATIVAN) injection 1 mg (1 mg Intravenous Given 07/08/22 2108)  haloperidol lactate (HALDOL) injection 2 mg (2 mg Intravenous Given 07/08/22 2138)  midazolam (VERSED) injection 4 mg (2 mg Intravenous Given by Other 07/08/22 2159)    ED Course/ Medical Decision Making/ A&P                           Medical Decision Making Adam Eisler. is a 88 y.o. male here presenting with weakness.  Patient at baseline is ambulatory and but woke up today and has hard time walking and has slight left-sided weakness.  Unfortunately, patient was in triage for several hours.  When I saw the patient CT head was ordered and patient do not have a right parietal intraparenchymal bleed.  I consulted neurosurgery, Dr. Zada Finders, at 8 PM and he felt that patient likely has intraparenchymal bleed.  Then I consulted Dr. Sunday Spillers from neurology.  He recommended CTA head and neck.  The CTA showed bleeding from the previous AVM.  I then talked to Dr. Venetia Constable around 10 PM and he will  be admitting the patient to the neurosurgery ICU.  In addition to intraparenchymal bleeding, I also consider electrolyte abnormality and UTI and also Depakote toxicity and lithium toxicity since patient is on lithium and Depakote.  His Depakote level is within therapeutic range and lithium level is low.  Patient remains in critical condition   Problems Addressed: AVM (arteriovenous malformation): chronic illness or injury with exacerbation, progression, or side effects of treatment Intraparenchymal hemorrhage of brain [facility]): acute illness or injury  Amount and/or Complexity of Data Reviewed Labs: ordered. Decision-making details documented in ED Course. Radiology: ordered and  independent interpretation performed. Decision-making details documented in ED Course.  Risk Prescription drug management. Decision regarding hospitalization.    Final Clinical Impression(s) / ED Diagnoses Final diagnoses:  None    Rx / DC Orders ED Discharge Orders     None         Drenda Freeze, MD 07/08/22 2311

## 2022-07-08 NOTE — ED Notes (Signed)
Writer was calling another patient for triage and patient's son comes running across the lobby yelling that his dad needed to be cleaned up and demanding that the writer get towels. Writer told the son that I needed a second to see where I could bring him in to clean him up. When writer went back out there to get the patient the son was complaining tht no one had been out to care for his dad in the lobby.

## 2022-07-08 NOTE — ED Notes (Signed)
Report called to nurse on 4 nurse

## 2022-07-08 NOTE — ED Notes (Addendum)
Patient was cleaned, given clean blue scrub pants and a shirt, a fresh blanket and placed in a recliner in the [facility] area.   Registration let the son in and patient's son is on the phone complaining about the patient's care. Writer tried to explain to the son that when the patient was in the triage area he asked for a urinal twice and Probation officer and Horris Latino, [place]  assisted the patient with the urinal.  Patient's son came behind the desk twice, pointing his finger at the writer stating that his dad needed to be cleaned. Explained to the SOn tht when the patient was in the triage area that he was taken to the bathroom twice and agiven a urinal. Which patient used without any difficulty.

## 2022-07-08 NOTE — ED Triage Notes (Signed)
Per EMS- patient is from [facility]. Patient normally walks frequently, but today has had decreased mobility and weakness.  Staff reports intermittent dementia and anxiety.

## 2022-07-08 NOTE — ED Notes (Signed)
[facility] called for patient transport 

## 2022-07-08 NOTE — ED Provider Triage Note (Signed)
Emergency Medicine Provider Triage Evaluation Note  Adam Swanson. , a 88 y.o. male  was evaluated in triage.  Pt complains of weakness.  States he has been feeling very weak the last few week, got worse in the last few days.  States he has double vision and seeing things that are not present.  Patient reportedly has intermittent dementia and anxiety.  Denies chest pain, shortness of breath, fever, bowel changes, urinary symptoms.  Review of Systems  Positive: As above Negative: As above  Physical Exam  BP 113/87   Pulse 61   Temp 98.2 F (36.8 C) (Oral)   Resp 16   SpO2 99%  Gen:   Awake, no distress   Resp:  Normal effort  MSK:   Moves extremities without difficulty  Other:    Medical Decision Making  Medically screening exam initiated at 1:28 PM.  Appropriate orders placed.  Adam Swanson. was informed that the remainder of the evaluation will be completed by another provider, this initial triage assessment does not replace that evaluation, and the importance of remaining in the ED until their evaluation is complete.  Work-up initiated.   Rex Kras, [place] 07/08/22 1351

## 2022-07-09 DIAGNOSIS — I619 Nontraumatic intracerebral hemorrhage, unspecified: Secondary | ICD-10-CM | POA: Diagnosis present

## 2022-07-09 LAB — MRSA NEXT GEN BY PCR, NASAL: MRSA by PCR Next Gen: NOT DETECTED

## 2022-07-09 MED ORDER — ORAL CARE MOUTH RINSE: 15.0000 mL | OROMUCOSAL | Status: DC | PRN

## 2022-07-09 MED ORDER — ONDANSETRON HCL 4 MG PO TABS: 4.0000 mg | ORAL_TABLET | ORAL | Status: DC | PRN

## 2022-07-09 MED ORDER — TAMSULOSIN HCL 0.4 MG PO CAPS
0.4000 mg | ORAL_CAPSULE | Freq: Every day | ORAL | Status: DC
  Administered 2022-07-09 – 2022-07-11 (×3): 0.4 mg via ORAL
  Filled 2022-07-09 (×3): qty 1

## 2022-07-09 MED ORDER — MELATONIN 3 MG PO TABS
3.0000 mg | ORAL_TABLET | Freq: Every day | ORAL | Status: DC
  Administered 2022-07-09 – 2022-07-11 (×3): 3 mg via ORAL
  Filled 2022-07-09 (×3): qty 1

## 2022-07-09 MED ORDER — DIVALPROEX SODIUM ER 250 MG PO TB24
250.0000 mg | ORAL_TABLET | Freq: Every day | ORAL | Status: DC
  Administered 2022-07-09 – 2022-07-12 (×4): 250 mg via ORAL
  Filled 2022-07-09 (×4): qty 1

## 2022-07-09 MED ORDER — HYDROMORPHONE HCL 1 MG/ML IJ SOLN: 0.5000 mg | INTRAMUSCULAR | Status: DC | PRN

## 2022-07-09 MED ORDER — SENNA 8.6 MG PO TABS: 2.0000 | ORAL_TABLET | Freq: Every day | ORAL | Status: DC | PRN

## 2022-07-09 MED ORDER — ACETAMINOPHEN 325 MG PO TABS
650.0000 mg | ORAL_TABLET | ORAL | Status: DC | PRN
  Administered 2022-07-09: 650 mg via ORAL
  Filled 2022-07-09: qty 2

## 2022-07-09 MED ORDER — LABETALOL HCL 5 MG/ML IV SOLN
10.0000 mg | INTRAVENOUS | Status: DC | PRN
  Administered 2022-07-09: 20 mg via INTRAVENOUS
  Filled 2022-07-09: qty 4

## 2022-07-09 MED ORDER — FAMOTIDINE 20 MG PO TABS
20.0000 mg | ORAL_TABLET | Freq: Every evening | ORAL | Status: DC
  Administered 2022-07-10 – 2022-07-11 (×2): 20 mg via ORAL
  Filled 2022-07-09 (×2): qty 1

## 2022-07-09 MED ORDER — ONDANSETRON HCL 4 MG/2ML IJ SOLN: 4.0000 mg | INTRAMUSCULAR | Status: DC | PRN

## 2022-07-09 MED ORDER — DEMECLOCYCLINE HCL 150 MG PO TABS
150.0000 mg | ORAL_TABLET | Freq: Two times a day (BID) | ORAL | Status: DC
  Administered 2022-07-09 – 2022-07-12 (×7): 150 mg via ORAL
  Filled 2022-07-09 (×8): qty 1

## 2022-07-09 MED ORDER — DOCUSATE SODIUM 100 MG PO CAPS
100.0000 mg | ORAL_CAPSULE | Freq: Two times a day (BID) | ORAL | Status: DC
  Administered 2022-07-09 – 2022-07-12 (×6): 100 mg via ORAL
  Filled 2022-07-09 (×6): qty 1

## 2022-07-09 MED ORDER — SODIUM CHLORIDE 1 G PO TABS
4.0000 g | ORAL_TABLET | Freq: Every day | ORAL | Status: DC
  Administered 2022-07-09 – 2022-07-12 (×4): 4 g via ORAL
  Filled 2022-07-09 (×4): qty 4

## 2022-07-09 MED ORDER — HYDROCODONE-ACETAMINOPHEN 5-325 MG PO TABS
1.0000 | ORAL_TABLET | ORAL | Status: DC | PRN
  Administered 2022-07-11: 1 via ORAL
  Filled 2022-07-09: qty 1

## 2022-07-09 MED ORDER — FUROSEMIDE 40 MG PO TABS
40.0000 mg | ORAL_TABLET | Freq: Every day | ORAL | Status: DC
  Administered 2022-07-09 – 2022-07-12 (×4): 40 mg via ORAL
  Filled 2022-07-09 (×4): qty 1

## 2022-07-09 MED ORDER — HEPARIN SODIUM (PORCINE) 5000 UNIT/ML IJ SOLN
5000.0000 [IU] | Freq: Three times a day (TID) | INTRAMUSCULAR | Status: DC
  Administered 2022-07-11 – 2022-07-12 (×4): 5000 [IU] via SUBCUTANEOUS
  Filled 2022-07-09 (×4): qty 1

## 2022-07-09 MED ORDER — POLYETHYLENE GLYCOL 3350 17 G PO PACK
17.0000 g | PACK | Freq: Every day | ORAL | Status: DC | PRN
  Administered 2022-07-11: 17 g via ORAL
  Filled 2022-07-09: qty 1

## 2022-07-09 MED ORDER — PROMETHAZINE HCL 25 MG PO TABS: 12.5000 mg | ORAL_TABLET | ORAL | Status: DC | PRN

## 2022-07-09 MED ORDER — ACETAMINOPHEN 650 MG RE SUPP: 650.0000 mg | RECTAL | Status: DC | PRN

## 2022-07-09 MED ORDER — LORAZEPAM 0.5 MG PO TABS
0.5000 mg | ORAL_TABLET | Freq: Three times a day (TID) | ORAL | Status: DC | PRN
  Administered 2022-07-09 – 2022-07-11 (×4): 0.5 mg via ORAL
  Filled 2022-07-09 (×4): qty 1

## 2022-07-09 MED ORDER — CHLORHEXIDINE GLUCONATE CLOTH 2 % EX PADS
6.0000 | MEDICATED_PAD | Freq: Every day | CUTANEOUS | Status: DC
  Administered 2022-07-09 – 2022-07-12 (×5): 6 via TOPICAL

## 2022-07-09 MED ORDER — METOPROLOL TARTRATE 25 MG PO TABS
25.0000 mg | ORAL_TABLET | Freq: Two times a day (BID) | ORAL | Status: DC
  Administered 2022-07-09 (×2): 25 mg via ORAL
  Filled 2022-07-09 (×2): qty 1

## 2022-07-09 NOTE — H&P (Signed)
Neurosurgery H&P  CC: Intracerebral hemorrhage  HPI: This is a 88 y.o. man with history of reported dementia, prior seizures, previously on apixiban for Afib but appears to have stopped that. 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 He is a good historian for his present symptoms, but not a very good historian for past events. No new weakness, numbness, or parasthesias, no headaches, he says he feels fine and isn't sure why he's in the hospital.   ROS: A 14 point ROS was performed and is negative except as noted in the HPI.   PMHx:  Past Medical History:  Diagnosis Date   Anxiety    Colon polyp    hyperplastic   Diverticulosis of colon (without mention of hemorrhage)    Esophageal stricture    GERD (gastroesophageal reflux disease)    Hiatal hernia    Lower extremity weakness    OCD (obsessive compulsive disorder)    Viral hepatitis 08/1983   FamHx:  Family History  Problem Relation Age of Onset   Diverticulosis Mother    Melanoma Mother    Lung cancer Father    Colon cancer Other        1st cousion.   Allergic rhinitis Neg Hx    Angioedema Neg Hx    Asthma Neg Hx    Eczema Neg Hx    Immunodeficiency Neg Hx    Urticaria Neg Hx    SocHx:  reports that he has never smoked. He has never used smokeless tobacco. He reports that he does not currently use alcohol after a past usage of about 7.0 standard drinks of alcohol per week. He reports that he does not use drugs.  Exam: Vital signs in last 24 hours: Temp:  [97.6 F (36.4 C)-99.1 F (37.3 C)] 98.9 F (37.2 C) (10/30 0800) Pulse Rate:  [59-117] 64 (10/30 0800) Resp:  [16-28] 17 (10/30 0800) BP: (86-145)/(62-128) 99/82 (10/30 0800) SpO2:  [83 %-100 %] 94 % (10/30 0800) General: Awake, alert, cooperative, lying in bed in NAD Head: Normocephalic and atruamatic HEENT: Neck supple Pulmonary: breathing room air comfortably, no evidence of increased work of  breathing Cardiac: irregularly irregular, rate 90s-130s Abdomen: S NT ND Extremities: Warm and well perfused x4 Neuro: AOx3, PERRL, R eye with restricted opening, which he states is chronic, dec'd VA OD, OS at his baseline by his report with full EOM bilaterally, FS Strength 5/5 x4, SILTx4   Assessment and Plan: 88 y.o. man with altered mental status. Hull personally reviewed, which shows R parietal ICH with associated small volume SAH, CTA shows likely AVF adjacent to the falx on the right, neurologically doing well, ICH score / Hunt Hess grading inappropriate for this pathology. Prior CTA with similar finding 6 months ago, doesn't look like from chart review that he's seen neurosurgery.  Neuro: -will d/w Dr. 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

## 2022-07-09 NOTE — Progress Notes (Signed)
Patient resides at [facility] and will return at discharge. Will continue to follow.  Gilmore Laroche, MSW, [facility]

## 2022-07-09 NOTE — Progress Notes (Signed)
Dr. Zada Finders notified that patient has arrived on the unit.

## 2022-07-10 LAB — URINE CULTURE: Culture: NO GROWTH

## 2022-07-10 MED ORDER — AMIODARONE LOAD VIA INFUSION
150.0000 mg | Freq: Once | INTRAVENOUS | Status: AC
  Administered 2022-07-10: 150 mg via INTRAVENOUS
  Filled 2022-07-10: qty 83.34

## 2022-07-10 MED ORDER — AMIODARONE HCL IN DEXTROSE 360-4.14 MG/200ML-% IV SOLN
INTRAVENOUS | Status: AC
  Filled 2022-07-10: qty 200

## 2022-07-10 MED ORDER — AMIODARONE HCL IN DEXTROSE 360-4.14 MG/200ML-% IV SOLN
60.0000 mg/h | INTRAVENOUS | Status: DC
  Administered 2022-07-10 (×2): 60 mg/h via INTRAVENOUS
  Filled 2022-07-10: qty 200

## 2022-07-10 MED ORDER — SODIUM CHLORIDE 0.9 % IV BOLUS
500.0000 mL | Freq: Once | INTRAVENOUS | Status: AC
  Administered 2022-07-10: 500 mL via INTRAVENOUS

## 2022-07-10 MED ORDER — METOPROLOL TARTRATE 12.5 MG HALF TABLET
12.5000 mg | ORAL_TABLET | Freq: Two times a day (BID) | ORAL | Status: DC
  Administered 2022-07-10 – 2022-07-11 (×2): 12.5 mg via ORAL
  Filled 2022-07-10 (×4): qty 1

## 2022-07-10 MED ORDER — AMIODARONE HCL IN DEXTROSE 360-4.14 MG/200ML-% IV SOLN
30.0000 mg/h | INTRAVENOUS | Status: DC
  Administered 2022-07-11: 30 mg/h via INTRAVENOUS
  Filled 2022-07-10: qty 200

## 2022-07-10 NOTE — Progress Notes (Signed)
Contacted neurosurgeon on call (Dr. Christella Noa) regarding ongoing atrial fibrillation with RVR. MD coming to bedside.   Normajean Baxter, RN

## 2022-07-10 NOTE — Progress Notes (Signed)
  NEUROSURGERY PROGRESS NOTE   No issues overnight. Reviewed history in EMR and with Dr. Zada Finders.   EXAM:  BP 110/75   Pulse 69   Temp 97.6 F (36.4 C) (Axillary)   Resp 15   SpO2 99%   Awake, alert, confused  Speech fluent CN grossly intact  5/5 BUE/BLE   IMAGING: CT head and CTA reviewed and demonstrate small conglomeration of vessels in proximity to the right parasagittal parietal hematoma including a likely draining vein emptying into the SSS. 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  IMPRESSION:  88 y.o. male presenting with AMS, baseline history of dementia. Imaging has a small right parietal IPH likely a result of underlying vascular malformation/fistula. Previous imaging reveals likelihood of multiple prior hemorrhages. Clinically, it appears patient is likely near his baseline. Given his age I do not feel that he would be a candidate for treatment of any underlying vascular malformation, whether endovascular or open. I therefore see no need for diagnostic angiogram.  PLAN: - Cont supportive care - With patient at or near baseline, can likely return to his SNF in the next day or two.   Consuella Lose, MD [facility]

## 2022-07-10 NOTE — Progress Notes (Signed)
Patient in and out of atrial fibrillation with rapid ventricular rate- HR spanning from 80s-150s. Dr. Kathyrn Sheriff notified. Metoprolol 12.5 mg BID ordered.   Truett Mainland, RN

## 2022-07-11 NOTE — Progress Notes (Signed)
Neurosurgery Service Progress Note  Subjective: No acute events overnight, had some RVR that doesn't sound like it was symptomatic, previously had RVR when I saw him that was asymptomatic, denies any current Sx, got an amio load, completed the '1mg'$ /min, now on the 0.'5mg'$ /min with good control, no hypotensive sx, making good urine  Objective: Vitals:   07/11/22 0500 07/11/22 0600 07/11/22 0700 07/11/22 0800  BP: 119/78 116/81  100/71  Pulse: 64 62 65 65  Resp: 17 (!) '22 12 19  '$ Temp:      TempSrc:      SpO2: 98% 96% 96% 96%    Physical Exam: Awake/alert, perseverating about urinating, has a purewick on and urinating, Fcx4 without preference  Assessment & Plan: 88 y.o. man with likely AVF versus AVM and rupture, no further Tx given pt's age and overall health status.  -transfer to 4NP -will d/c the amio gtt -can return to SNF when bed available  Judith Part  07/11/22 9:03 AM

## 2022-07-11 NOTE — Evaluation (Signed)
Occupational Therapy Evaluation Patient Details Name: Adam Swanson. MRN: [id number] DOB: 1928-04-02 Today's Date: 07/11/2022   History of Present Illness 88 yo male admitted 07/08/22 s/p fall with R parietal ICH with associated small volume SAH, AVF adjacent to the falx on the R. Pt with RVR 10/31 PMH dementia, fall 04/23 with rib fxs,CKD, BPH, GERD, anxiety.   Clinical Impression   PT admitted with R parietal ICH . Pt currently with functional limitiations due to the deficits listed below (see OT problem list). Pt has history of dementia and no family present at this time. Pt requires increased (A) for balance. Pt pushing RW too far advance and needs max cues to progress with RW safely. Pt is high fall risk and should have (A) upon d/c 24/7.  Pt will benefit from skilled OT to increase their independence and safety with adls and balance to allow discharge SNF level care if possible at [facility].       Recommendations for follow up therapy are one component of a multi-disciplinary discharge planning process, led by the attending physician.  Recommendations may be updated based on patient status, additional functional criteria and insurance authorization.   Follow Up Recommendations  Skilled nursing-short term rehab (<3 hours/day)    Assistance Recommended at Discharge Intermittent Supervision/Assistance  Patient can return home with the following A lot of help with walking and/or transfers;A lot of help with bathing/dressing/bathroom    Functional Status Assessment  Patient has had a recent decline in their functional status and demonstrates the ability to make significant improvements in function in a reasonable and predictable amount of time.  Equipment Recommendations  None recommended by OT    Recommendations for Other Services       Precautions / Restrictions Precautions Precautions: Fall      Mobility Bed Mobility Overal bed mobility: Needs Assistance Bed Mobility:  Rolling, Supine to Sit Rolling: Min assist   Supine to sit: Mod assist     General bed mobility comments: pt requires (A) to elevate trunk. pt with very flexed posture and needs (A) to scoot toward eob    Transfers Overall transfer level: Needs assistance Equipment used: Rolling walker (2 wheels) Transfers: Sit to/from Stand Sit to Stand: Mod assist, +2 physical assistance, +2 safety/equipment           General transfer comment: needs (A) to keep the RW close      Balance Overall balance assessment: Needs assistance Sitting-balance support: Bilateral upper extremity supported, Feet supported Sitting balance-Leahy Scale: Poor     Standing balance support: Bilateral upper extremity supported, During functional activity Standing balance-Leahy Scale: Poor                             ADL either performed or assessed with clinical judgement   ADL Overall ADL's : Needs assistance/impaired Eating/Feeding: Minimal assistance;Sitting                   Lower Body Dressing: Moderate assistance;Sit to/from stand   Toilet Transfer: Moderate assistance Toilet Transfer Details (indicate cue type and reason): pt got up off the BSC standing away from the commode reports "i am peeing in the toilet" pt was not over the toilet and was not peeing. Pt is high fall risk Toileting- Clothing Manipulation and Hygiene: Maximal assistance;Sit to/from stand       Functional mobility during ADLs: Moderate assistance;Rolling walker (2 wheels) General ADL Comments: pt was on  the bed pan on arrival and did not recall. pt progressed to the bathroom and upon standing states "how in the hell am i going to do this at home"     Vision Patient Visual Report: Diplopia Vision Assessment?: Yes Additional Comments: pt reports sometimes i see two people but unable to describe further. need to further assess during functional task     Perception     Praxis      Pertinent Vitals/Pain  Pain Assessment Pain Assessment: No/denies pain     Hand Dominance Right   Extremity/Trunk Assessment Upper Extremity Assessment Upper Extremity Assessment: Overall WFL for tasks assessed   Lower Extremity Assessment Lower Extremity Assessment: Defer to PT evaluation   Cervical / Trunk Assessment Cervical / Trunk Assessment: Kyphotic   Communication Communication Communication: No difficulties   Cognition Arousal/Alertness: Awake/alert Behavior During Therapy: WFL for tasks assessed/performed Overall Cognitive Status: History of cognitive impairments - at baseline                                 General Comments: pt able to state last night is halloween and now its november. pt is able to correctly the clock. pt able to correctly tell prior address     General Comments  Max HR 106    Exercises     Shoulder Instructions      Home Living Family/patient expects to be discharged to:: Assisted living Living Arrangements: Alone   Type of Home: Independent living facility Home Access: Level entry     Home Layout: One level     Bathroom Shower/Tub: Occupational psychologist: Standard     Home Equipment: Rollator (4 wheels);Shower seat - built in;Toilet riser   Additional Comments: from [facility] ([place]      Prior Functioning/Environment Prior Level of Function : Patient poor historian/Family not available             Mobility Comments: walks with Rollator ADLs Comments: has (A) with aide on shower built in seat in shower, describes handles by the toilet but denies its a BSC or into the wall        OT Problem List: Decreased strength;Decreased activity tolerance;Impaired balance (sitting and/or standing);Decreased knowledge of use of DME or AE;Decreased knowledge of precautions;Decreased cognition;Decreased safety awareness      OT Treatment/Interventions: Self-care/ADL training;Therapeutic exercise;DME and/or AE  instruction;Therapeutic activities;Patient/family education;Balance training    OT Goals(Current goals can be found in the care plan section) Acute Rehab OT Goals Patient Stated Goal: to have medical staff talk to his son [name] OT Goal Formulation: With patient Time For Goal Achievement: 07/25/22 Potential to Achieve Goals: Good  OT Frequency: Min 2X/week    Co-evaluation PT/OT/SLP Co-Evaluation/Treatment: Yes Reason for Co-Treatment: Necessary to address cognition/behavior during functional activity;For patient/therapist safety;Complexity of the patient's impairments (multi-system involvement)   OT goals addressed during session: ADL's and self-care;Proper use of Adaptive equipment and DME;Strengthening/ROM      AM-PAC OT "6 Clicks" Daily Activity     Outcome Measure Help from another person eating meals?: A Little Help from another person taking care of personal grooming?: A Little Help from another person toileting, which includes using toliet, bedpan, or urinal?: A Little Help from another person bathing (including washing, rinsing, drying)?: A Little Help from another person to put on and taking off regular upper body clothing?: A Little   6 Click Score: 15   End  of Session Equipment Utilized During Treatment: Gait belt;Rolling walker (2 wheels) Nurse Communication: Mobility status;Precautions  Activity Tolerance: Patient tolerated treatment well Patient left: in chair;with call bell/phone within reach;with chair alarm set  OT Visit Diagnosis: Unsteadiness on feet (R26.81);Muscle weakness (generalized) (M62.81)                Time: 0990-6893 OT Time Calculation (min): 33 min Charges:  OT General Charges $OT Visit: 1 Visit OT Evaluation $OT Eval Moderate Complexity: 1 Mod   [place], OTR/L  Acute Rehabilitation Services Office: [phone] .   Jeri Modena 07/11/2022, 12:57 PM

## 2022-07-11 NOTE — TOC Progression Note (Addendum)
Transition of Care (TOC) - Progression Note    Patient Details  Name: Adam Swanson. MRN: [id number] Date of Birth: 12-29-1927  Transition of Care [facility]) CM/SW [place], LCSW Phone Number: 07/11/2022, 11:47 AM  Clinical Narrative:    CSW made [facility] SNF aware that patient may be ready for discharge soon. They are able to accept patient whenever ready. Since therapy is seeing patient today, Tarri Glenn can utilize his Medicare for therapies upon return since he will meet the 3 inpatient night criteria tomorrow.    Expected Discharge Plan: [place] Barriers to Discharge: Continued Medical Work up  Expected Discharge Plan and Services Expected Discharge Plan: [place] In-house Referral: Clinical Social Work   Post Acute Care Choice: [place] Living arrangements for the past 2 months: [place]                                       Social Determinants of Health (SDOH) Interventions    Readmission Risk Interventions     No data to display

## 2022-07-11 NOTE — Plan of Care (Signed)
  Problem: Education: Goal: Knowledge of the prescribed therapeutic regimen will improve Outcome: Progressing   Problem: Clinical Measurements: Goal: Usual level of consciousness will be regained or maintained. Outcome: Progressing Goal: Neurologic status will improve Outcome: Progressing Goal: Ability to maintain intracranial pressure will improve Outcome: Progressing   Problem: Skin Integrity: Goal: Demonstration of wound healing without infection will improve Outcome: Progressing   Problem: Safety: Goal: Non-violent Restraint(s) Outcome: Progressing   Problem: Education: Goal: Knowledge of General Education information will improve Description: Including pain rating scale, medication(s)/side effects and non-pharmacologic comfort measures Outcome: Progressing   Problem: Health Behavior/Discharge Planning: Goal: Ability to manage health-related needs will improve Outcome: Progressing   Problem: Clinical Measurements: Goal: Ability to maintain clinical measurements within normal limits will improve Outcome: Progressing Goal: Will remain free from infection Outcome: Progressing Goal: Diagnostic test results will improve Outcome: Progressing Goal: Respiratory complications will improve Outcome: Progressing Goal: Cardiovascular complication will be avoided Outcome: Progressing   Problem: Activity: Goal: Risk for activity intolerance will decrease Outcome: Progressing   Problem: Nutrition: Goal: Adequate nutrition will be maintained Outcome: Progressing   Problem: Coping: Goal: Level of anxiety will decrease Outcome: Progressing   Problem: Elimination: Goal: Will not experience complications related to bowel motility Outcome: Progressing Goal: Will not experience complications related to urinary retention Outcome: Progressing   Problem: Pain Managment: Goal: General experience of comfort will improve Outcome: Progressing   Problem: Safety: Goal: Ability to  remain free from injury will improve Outcome: Progressing   Problem: Skin Integrity: Goal: Risk for impaired skin integrity will decrease Outcome: Progressing

## 2022-07-11 NOTE — NC FL2 (Signed)
[place] LEVEL OF CARE SCREENING TOOL     IDENTIFICATION  Patient Name: Adam Swanson. Birthdate: Jun 22, 1928 Sex: male Admission Date (Current Location): 07/08/2022  [facility] and [place] Number:  Herbalist and Address:  [facility] [place] [address], [place], [place] [address]      Provider Number: [id number]  Attending Physician Name and Address:  Judith Part, MD  Relative Name and Phone Number:       Current Level of Care: Hospital Recommended Level of Care: [place] Prior Approval Number:    Date Approved/Denied:   PASRR Number: [id number]  Discharge Plan: SNF    Current Diagnoses: Patient Active Problem List   Diagnosis Date Noted   ICH (intracerebral hemorrhage) ([place]) 07/09/2022   Intracerebral bleed ([place]) 07/08/2022   Seizure disorder ([place]) 05/15/2022   Severe dementia ([place]) 02/19/2022   Seizure-like activity ([place]) 02/19/2022   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   Recurrent pneumothorax after chest tube removed 01/08/2022   Pneumothorax, traumatic    Stroke ([place]) 12/30/2021   Goals of care, counseling/discussion    Delirium 12/28/2021   Anemia in chronic kidney disease (CKD) 12/27/2021   Dementia without behavioral disturbance ([place]) 12/27/2021   Hypokalemia 12/27/2021   A-fib ([place]) 12/26/2021   Traumatic fracture of ribs with pneumothorax, left, sequela 12/26/2021   Seizure ([place]) 12/26/2021   Anxiety    Chronic kidney disease, stage 3a ([place])    Gait abnormality 01/30/2017   Weakness 01/28/2017   Low back pain without sciatica 01/28/2017   Bronchopneumonia 09/24/2015   Hyponatremia 09/24/2015   Abnormal ECG 09/24/2015   HCAP (healthcare-associated pneumonia) 09/24/2015   Heart murmur 09/24/2015   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   Benign neoplasm of colon 04/09/2011   Unspecified constipation 04/09/2011   Constipation 01/18/2011   Change in bowel function 01/18/2011    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   General symptom  01/18/2011   Oropharyngeal dysphagia 01/18/2011   DIVERTICULOSIS, COLON 05/14/2007   HIATAL HERNIA 03/19/2003    Orientation RESPIRATION BLADDER Height & Weight     Self, Place, Time  Normal Incontinent, External catheter Weight:   Height:     BEHAVIORAL SYMPTOMS/MOOD NEUROLOGICAL BOWEL NUTRITION STATUS      Continent Diet (See dc summary)  AMBULATORY STATUS COMMUNICATION OF NEEDS Skin   Extensive Assist Verbally Normal                       Personal Care Assistance Level of Assistance  Bathing, Feeding, Dressing Bathing Assistance: Maximum assistance Feeding assistance: Maximum assistance Dressing Assistance: Maximum assistance     Functional Limitations Info             SPECIAL CARE FACTORS FREQUENCY                       Contractures Contractures Info: Not present    Additional Factors Info  Code Status, Allergies Code Status Info: Full Allergies Info: Bee Venom, Nsaids           Current Medications (07/11/2022):  This is the current hospital active medication list Current Facility-Administered Medications  Medication Dose Route Frequency Provider Last Rate Last Admin   acetaminophen (TYLENOL) tablet 650 mg  650 mg Oral Q4H PRN Judith Part, MD   650 mg at 07/09/22 2200   Or   acetaminophen (TYLENOL) suppository 650 mg  650 mg Rectal Q4H  PRN Judith Part, MD       Chlorhexidine Gluconate Cloth 2 % PADS 6 each  6 each Topical Q0600 Judith Part, MD   6 each at 07/10/22 1403   demeclocycline (DECLOMYCIN) tablet 150 mg  150 mg Oral BID Judith Part, MD   150 mg at 07/11/22 1001   divalproex (DEPAKOTE ER) 24 hr tablet 250 mg  250 mg Oral Daily Judith Part, MD   250 mg at 07/11/22 1001   docusate sodium (COLACE) capsule 100 mg  100 mg Oral BID Judith Part, MD   100 mg at 07/11/22 1000   famotidine (PEPCID) tablet 20 mg   20 mg Oral QPM Judith Part, MD   20 mg at 07/10/22 1741   furosemide (LASIX) tablet 40 mg  40 mg Oral Daily Judith Part, MD   40 mg at 07/11/22 1000   heparin injection 5,000 Units  5,000 Units Subcutaneous Q8H Judith Part, MD       HYDROcodone-acetaminophen (NORCO/VICODIN) 5-325 MG per tablet 1 tablet  1 tablet Oral Q4H PRN Judith Part, MD       HYDROmorphone (DILAUDID) injection 0.5 mg  0.5 mg Intravenous Q3H PRN Judith Part, MD       labetalol (NORMODYNE) injection 10-40 mg  10-40 mg Intravenous Q10 min PRN Judith Part, MD   20 mg at 07/09/22 1421   LORazepam (ATIVAN) tablet 0.5 mg  0.5 mg Oral Q8H PRN Judith Part, MD   0.5 mg at 07/10/22 2112   melatonin tablet 3 mg  3 mg Oral QHS Judith Part, MD   3 mg at 07/10/22 2112   metoprolol tartrate (LOPRESSOR) tablet 12.5 mg  12.5 mg Oral BID Consuella Lose, MD   12.5 mg at 07/10/22 1741   ondansetron (ZOFRAN) tablet 4 mg  4 mg Oral Q4H PRN Judith Part, MD       Or   ondansetron (ZOFRAN) injection 4 mg  4 mg Intravenous Q4H PRN Judith Part, MD       Oral care mouth rinse  15 mL Mouth Rinse PRN Judith Part, MD       polyethylene glycol (MIRALAX / GLYCOLAX) packet 17 g  17 g Oral Daily PRN Judith Part, MD   17 g at 07/11/22 0939   promethazine (PHENERGAN) tablet 12.5-25 mg  12.5-25 mg Oral Q4H PRN Judith Part, MD       senna (SENOKOT) tablet 17.2 mg  2 tablet Oral Daily PRN Judith Part, MD       sodium chloride tablet 4 g  4 g Oral Daily Judith Part, MD   4 g at 07/11/22 1000   tamsulosin (FLOMAX) capsule 0.4 mg  0.4 mg Oral QHS Judith Part, MD   0.4 mg at 07/10/22 2112     Discharge Medications: Please see discharge summary for a list of discharge medications.  Relevant Imaging Results:  Relevant Lab Results:   Additional Information SSN: [id number]  [place], LCSW

## 2022-07-11 NOTE — Progress Notes (Signed)
Physical Therapy Treatment Patient Details Name: Adam Swanson. MRN: [id number] DOB: Mar 07, 1928 Today's Date: 07/11/2022   History of Present Illness 88 yo male admitted 07/08/22 s/p fall with R parietal ICH with associated small volume SAH, AVF adjacent to the falx on the R. Pt with RVR 10/31 PMH dementia, fall 04/23 with rib fxs,CKD, BPH, GERD, anxiety.    PT Comments    Pt admitted with/for fall with R parietal ICH/SAH.  Pt needing mod to mod assist of 2 persons for basic mobility and gait.Marland Kitchen  Pt currently limited functionally due to the problems listed. ( See problems list.)   Pt will benefit from PT to maximize function and safety in order to get ready for next venue listed below.    Recommendations for follow up therapy are one component of a multi-disciplinary discharge planning process, led by the attending physician.  Recommendations may be updated based on patient status, additional functional criteria and insurance authorization.  Follow Up Recommendations  Skilled nursing-short term rehab (<3 hours/day) Can patient physically be transported by private vehicle: No   Assistance Recommended at Discharge Frequent or constant Supervision/Assistance  Patient can return home with the following A little help with walking and/or transfers;A little help with bathing/dressing/bathroom;Assistance with cooking/housework;Direct supervision/assist for medications management;Direct supervision/assist for financial management;Assist for transportation;Help with stairs or ramp for entrance   Equipment Recommendations  Other (comment) (TBD)    Recommendations for Other Services Rehab consult     Precautions / Restrictions Precautions Precautions: Fall     Mobility  Bed Mobility Overal bed mobility: Needs Assistance Bed Mobility: Rolling, Supine to Sit Rolling: Min assist   Supine to sit: Mod assist     General bed mobility comments: pt requires (A) to elevate trunk. pt with very  flexed posture and needs (A) to scoot toward eob    Transfers Overall transfer level: Needs assistance Equipment used: Rolling walker (2 wheels) Transfers: Sit to/from Stand Sit to Stand: Mod assist, +2 physical assistance, +2 safety/equipment           General transfer comment: needs (A) to keep the RW close    Ambulation/Gait Ambulation/Gait assistance: Mod assist, +2 physical assistance, +2 safety/equipment Gait Distance (Feet): 15 Feet (x2 with RW to/from the bathroom) Assistive device: Rolling walker (2 wheels) Gait Pattern/deviations: Step-through pattern   Gait velocity interpretation: <1.31 ft/sec, indicative of household ambulator   General Gait Details: pt with weak and unsteady gait.  Cues for safer proximity to the RW and for better posture in the RW.  Pt needed more assist for support and control of the RW as he progressed.   Stairs             Wheelchair Mobility    Modified Rankin (Stroke Patients Only) Modified Rankin (Stroke Patients Only) Pre-Morbid Rankin Score: No symptoms     Balance Overall balance assessment: Needs assistance Sitting-balance support: Bilateral upper extremity supported, Feet supported Sitting balance-Leahy Scale: Poor     Standing balance support: Bilateral upper extremity supported, During functional activity Standing balance-Leahy Scale: Poor                              Cognition Arousal/Alertness: Awake/alert Behavior During Therapy: WFL for tasks assessed/performed Overall Cognitive Status: History of cognitive impairments - at baseline  General Comments: pt able to state last night is halloween and now its november. pt is able to correctly read the clock. pt able to correctly tell prior address.  Pt became more disorient as therapist were leaving        Exercises      General Comments General comments (skin integrity, edema, etc.): Max HR 106 bpm       Pertinent Vitals/Pain Pain Assessment Pain Assessment: No/denies pain    Home Living Family/patient expects to be discharged to:: Assisted living Living Arrangements: Alone   Type of Home: Independent living facility Home Access: Level entry       Home Layout: One level Home Equipment: Rollator (4 wheels);Shower seat - built in;Toilet riser Additional Comments: from [facility]    Prior Function            PT Goals (current goals can now be found in the care plan section) Acute Rehab PT Goals Patient Stated Goal: go to rehab at [facility] PT Goal Formulation: With patient Time For Goal Achievement: 07/25/22 Potential to Achieve Goals: Good    Frequency    Min 3X/week      PT Plan      Co-evaluation PT/OT/SLP Co-Evaluation/Treatment: Yes Reason for Co-Treatment: Necessary to address cognition/behavior during functional activity PT goals addressed during session: Mobility/safety with mobility OT goals addressed during session: ADL's and self-care;Proper use of Adaptive equipment and DME;Strengthening/ROM      AM-PAC PT "6 Clicks" Mobility   Outcome Measure  Help needed turning from your back to your side while in a flat bed without using bedrails?: A Lot Help needed moving from lying on your back to sitting on the side of a flat bed without using bedrails?: A Lot Help needed moving to and from a bed to a chair (including a wheelchair)?: Total Help needed standing up from a chair using your arms (e.g., wheelchair or bedside chair)?: Total Help needed to walk in hospital room?: Total Help needed climbing 3-5 steps with a railing? : Total 6 Click Score: 8    End of Session   Activity Tolerance: Patient tolerated treatment well Patient left: in chair;with call bell/phone within reach;with chair alarm set Nurse Communication: Mobility status PT Visit Diagnosis: Muscle weakness (generalized) (M62.81);Other symptoms and signs involving the nervous  system (R29.898);Unsteadiness on feet (R26.81)     Time: 1610-9604 PT Time Calculation (min) (ACUTE ONLY): 33 min  Charges:                        07/11/2022  Ginger Carne., PT Acute Rehabilitation Services [phone]  (office)   Adam Swanson 07/11/2022, 1:54 PM

## 2022-07-12 DIAGNOSIS — I639 Cerebral infarction, unspecified: Secondary | ICD-10-CM | POA: Diagnosis not present

## 2022-07-12 DIAGNOSIS — E861 Hypovolemia: Secondary | ICD-10-CM | POA: Diagnosis present

## 2022-07-12 DIAGNOSIS — Z79899 Other long term (current) drug therapy: Secondary | ICD-10-CM | POA: Diagnosis not present

## 2022-07-12 DIAGNOSIS — R627 Adult failure to thrive: Secondary | ICD-10-CM | POA: Diagnosis present

## 2022-07-12 DIAGNOSIS — J180 Bronchopneumonia, unspecified organism: Secondary | ICD-10-CM | POA: Diagnosis not present

## 2022-07-12 DIAGNOSIS — I619 Nontraumatic intracerebral hemorrhage, unspecified: Secondary | ICD-10-CM | POA: Diagnosis not present

## 2022-07-12 DIAGNOSIS — F0280 Dementia in other diseases classified elsewhere without behavioral disturbance: Secondary | ICD-10-CM | POA: Diagnosis present

## 2022-07-12 DIAGNOSIS — Z800 Family history of malignant neoplasm of digestive organs: Secondary | ICD-10-CM | POA: Diagnosis not present

## 2022-07-12 DIAGNOSIS — J159 Unspecified bacterial pneumonia: Secondary | ICD-10-CM | POA: Diagnosis not present

## 2022-07-12 DIAGNOSIS — G40909 Epilepsy, unspecified, not intractable, without status epilepticus: Secondary | ICD-10-CM | POA: Diagnosis present

## 2022-07-12 DIAGNOSIS — R52 Pain, unspecified: Secondary | ICD-10-CM | POA: Diagnosis not present

## 2022-07-12 DIAGNOSIS — I482 Chronic atrial fibrillation, unspecified: Secondary | ICD-10-CM | POA: Diagnosis present

## 2022-07-12 DIAGNOSIS — M19012 Primary osteoarthritis, left shoulder: Secondary | ICD-10-CM | POA: Diagnosis not present

## 2022-07-12 DIAGNOSIS — Z9181 History of falling: Secondary | ICD-10-CM | POA: Diagnosis not present

## 2022-07-12 DIAGNOSIS — R4182 Altered mental status, unspecified: Secondary | ICD-10-CM | POA: Diagnosis not present

## 2022-07-12 DIAGNOSIS — Z801 Family history of malignant neoplasm of trachea, bronchus and lung: Secondary | ICD-10-CM | POA: Diagnosis not present

## 2022-07-12 DIAGNOSIS — N179 Acute kidney failure, unspecified: Secondary | ICD-10-CM | POA: Diagnosis not present

## 2022-07-12 DIAGNOSIS — S2242XA Multiple fractures of ribs, left side, initial encounter for closed fracture: Secondary | ICD-10-CM | POA: Diagnosis not present

## 2022-07-12 DIAGNOSIS — R739 Hyperglycemia, unspecified: Secondary | ICD-10-CM | POA: Diagnosis not present

## 2022-07-12 DIAGNOSIS — M150 Primary generalized (osteo)arthritis: Secondary | ICD-10-CM | POA: Diagnosis not present

## 2022-07-12 DIAGNOSIS — R5383 Other fatigue: Secondary | ICD-10-CM | POA: Diagnosis not present

## 2022-07-12 DIAGNOSIS — G301 Alzheimer's disease with late onset: Secondary | ICD-10-CM | POA: Diagnosis not present

## 2022-07-12 DIAGNOSIS — N1831 Chronic kidney disease, stage 3a: Secondary | ICD-10-CM | POA: Diagnosis present

## 2022-07-12 DIAGNOSIS — K449 Diaphragmatic hernia without obstruction or gangrene: Secondary | ICD-10-CM | POA: Diagnosis not present

## 2022-07-12 DIAGNOSIS — R531 Weakness: Secondary | ICD-10-CM | POA: Diagnosis not present

## 2022-07-12 DIAGNOSIS — N400 Enlarged prostate without lower urinary tract symptoms: Secondary | ICD-10-CM | POA: Diagnosis present

## 2022-07-12 DIAGNOSIS — R410 Disorientation, unspecified: Secondary | ICD-10-CM | POA: Diagnosis not present

## 2022-07-12 DIAGNOSIS — I2489 Other forms of acute ischemic heart disease: Secondary | ICD-10-CM | POA: Diagnosis present

## 2022-07-12 DIAGNOSIS — W19XXXA Unspecified fall, initial encounter: Secondary | ICD-10-CM | POA: Diagnosis not present

## 2022-07-12 DIAGNOSIS — Z7189 Other specified counseling: Secondary | ICD-10-CM | POA: Diagnosis not present

## 2022-07-12 DIAGNOSIS — Z7901 Long term (current) use of anticoagulants: Secondary | ICD-10-CM | POA: Diagnosis not present

## 2022-07-12 DIAGNOSIS — G4700 Insomnia, unspecified: Secondary | ICD-10-CM | POA: Diagnosis not present

## 2022-07-12 DIAGNOSIS — D649 Anemia, unspecified: Secondary | ICD-10-CM | POA: Diagnosis not present

## 2022-07-12 DIAGNOSIS — I4891 Unspecified atrial fibrillation: Secondary | ICD-10-CM | POA: Diagnosis not present

## 2022-07-12 DIAGNOSIS — F429 Obsessive-compulsive disorder, unspecified: Secondary | ICD-10-CM | POA: Diagnosis present

## 2022-07-12 DIAGNOSIS — R7989 Other specified abnormal findings of blood chemistry: Secondary | ICD-10-CM | POA: Diagnosis not present

## 2022-07-12 DIAGNOSIS — R296 Repeated falls: Secondary | ICD-10-CM | POA: Diagnosis not present

## 2022-07-12 DIAGNOSIS — R569 Unspecified convulsions: Secondary | ICD-10-CM | POA: Diagnosis not present

## 2022-07-12 DIAGNOSIS — N189 Chronic kidney disease, unspecified: Secondary | ICD-10-CM | POA: Diagnosis not present

## 2022-07-12 DIAGNOSIS — N390 Urinary tract infection, site not specified: Secondary | ICD-10-CM | POA: Diagnosis not present

## 2022-07-12 DIAGNOSIS — I493 Ventricular premature depolarization: Secondary | ICD-10-CM | POA: Diagnosis present

## 2022-07-12 DIAGNOSIS — U071 COVID-19: Secondary | ICD-10-CM | POA: Diagnosis not present

## 2022-07-12 DIAGNOSIS — I480 Paroxysmal atrial fibrillation: Secondary | ICD-10-CM | POA: Diagnosis not present

## 2022-07-12 DIAGNOSIS — Z86010 Personal history of colonic polyps: Secondary | ICD-10-CM | POA: Diagnosis not present

## 2022-07-12 DIAGNOSIS — R2681 Unsteadiness on feet: Secondary | ICD-10-CM | POA: Diagnosis not present

## 2022-07-12 DIAGNOSIS — M4000 Postural kyphosis, site unspecified: Secondary | ICD-10-CM | POA: Diagnosis not present

## 2022-07-12 DIAGNOSIS — E860 Dehydration: Secondary | ICD-10-CM | POA: Diagnosis present

## 2022-07-12 DIAGNOSIS — M6281 Muscle weakness (generalized): Secondary | ICD-10-CM | POA: Diagnosis not present

## 2022-07-12 DIAGNOSIS — F064 Anxiety disorder due to known physiological condition: Secondary | ICD-10-CM | POA: Diagnosis not present

## 2022-07-12 DIAGNOSIS — J189 Pneumonia, unspecified organism: Secondary | ICD-10-CM | POA: Diagnosis not present

## 2022-07-12 DIAGNOSIS — I10 Essential (primary) hypertension: Secondary | ICD-10-CM | POA: Diagnosis not present

## 2022-07-12 DIAGNOSIS — Z8673 Personal history of transient ischemic attack (TIA), and cerebral infarction without residual deficits: Secondary | ICD-10-CM | POA: Diagnosis not present

## 2022-07-12 DIAGNOSIS — S270XXA Traumatic pneumothorax, initial encounter: Secondary | ICD-10-CM | POA: Diagnosis not present

## 2022-07-12 DIAGNOSIS — E870 Hyperosmolality and hypernatremia: Secondary | ICD-10-CM | POA: Diagnosis not present

## 2022-07-12 DIAGNOSIS — S270XXD Traumatic pneumothorax, subsequent encounter: Secondary | ICD-10-CM | POA: Diagnosis not present

## 2022-07-12 DIAGNOSIS — Z1152 Encounter for screening for COVID-19: Secondary | ICD-10-CM | POA: Diagnosis not present

## 2022-07-12 DIAGNOSIS — F0394 Unspecified dementia, unspecified severity, with anxiety: Secondary | ICD-10-CM | POA: Diagnosis not present

## 2022-07-12 DIAGNOSIS — N183 Chronic kidney disease, stage 3 unspecified: Secondary | ICD-10-CM | POA: Diagnosis not present

## 2022-07-12 DIAGNOSIS — E785 Hyperlipidemia, unspecified: Secondary | ICD-10-CM | POA: Diagnosis not present

## 2022-07-12 DIAGNOSIS — Z808 Family history of malignant neoplasm of other organs or systems: Secondary | ICD-10-CM | POA: Diagnosis not present

## 2022-07-12 DIAGNOSIS — K5900 Constipation, unspecified: Secondary | ICD-10-CM | POA: Diagnosis present

## 2022-07-12 DIAGNOSIS — R112 Nausea with vomiting, unspecified: Secondary | ICD-10-CM | POA: Diagnosis not present

## 2022-07-12 DIAGNOSIS — G9341 Metabolic encephalopathy: Secondary | ICD-10-CM | POA: Diagnosis present

## 2022-07-12 DIAGNOSIS — W010XXA Fall on same level from slipping, tripping and stumbling without subsequent striking against object, initial encounter: Secondary | ICD-10-CM | POA: Diagnosis not present

## 2022-07-12 DIAGNOSIS — I618 Other nontraumatic intracerebral hemorrhage: Secondary | ICD-10-CM | POA: Diagnosis not present

## 2022-07-12 DIAGNOSIS — F02B4 Dementia in other diseases classified elsewhere, moderate, with anxiety: Secondary | ICD-10-CM | POA: Diagnosis not present

## 2022-07-12 DIAGNOSIS — Z515 Encounter for palliative care: Secondary | ICD-10-CM | POA: Diagnosis not present

## 2022-07-12 DIAGNOSIS — F0390 Unspecified dementia without behavioral disturbance: Secondary | ICD-10-CM | POA: Diagnosis not present

## 2022-07-12 DIAGNOSIS — Z87898 Personal history of other specified conditions: Secondary | ICD-10-CM | POA: Diagnosis not present

## 2022-07-12 DIAGNOSIS — R2689 Other abnormalities of gait and mobility: Secondary | ICD-10-CM | POA: Diagnosis not present

## 2022-07-12 DIAGNOSIS — I608 Other nontraumatic subarachnoid hemorrhage: Secondary | ICD-10-CM | POA: Diagnosis not present

## 2022-07-12 DIAGNOSIS — R41841 Cognitive communication deficit: Secondary | ICD-10-CM | POA: Diagnosis not present

## 2022-07-12 NOTE — Progress Notes (Signed)
Phoned [place] neurosurgery office for on-call provider to call me in regards to patients soft pressures and scheduled beta blocker (need parameters). Awaiting call back.

## 2022-07-12 NOTE — Discharge Summary (Addendum)
Discharge Summary  Date of Admission: 07/08/2022  Date of Discharge: 07/12/22  Attending Physician: Emelda Brothers, MD  Hospital Course: Patient was admitted due to altered mental status and some worsening ambulation. Workup at OSH showed a right parietal small ICH with some surrounding edema and SAH, appeared c/w recent likely early subacute hemorrhage, CTA re-demonstrated a previously seen AVF versus AVM in the region. Endovascular neurosurgery consulted, no intervention was recommended given patient's age and overall health. Hospital stay was otherwise notable for some Afib with RVR some mild hypotension, both asymptomatic. He had a amiodarone load which resolved this quite well and resumed his home regimen after the load without further issue. His hospital course was otherwise unremarkable uncomplicated and the patient was discharged back to his SNF on 07/12/22.  Neurologic exam at discharge:  Aox2, stable / baseline R partial suspected tarsorrhaphy, gaze conjugate, stable baseline visual fields, Fcx4 without preference   Discharge diagnosis: Intracerebral hemorrhage  Judith Part, MD 07/12/22 11:50 AM  Allergies as of 07/12/2022       Reactions   Bee Venom Anaphylaxis, Other (See Comments)   Loss of consciousness also   Nsaids Other (See Comments)   "Allergic," per [facility]        Medication List     TAKE these medications    acetaminophen 325 MG tablet Commonly known as: TYLENOL Take 650 mg by mouth every 6 (six) hours as needed for mild pain (MAX OF 4,000 MG/24 HOURS FROM ALL COMBINED SOURCES OF TYLENOL).   demeclocycline 150 MG tablet Commonly known as: DECLOMYCIN Take 1 tablet (150 mg total) by mouth 2 (two) times daily. What changed:  when to take this additional instructions   divalproex 250 MG 24 hr tablet Commonly known as: Depakote ER Take 1 tablet (250 mg total) by mouth daily.   Depakote 250 MG DR tablet Generic drug: divalproex Take 250 mg by  mouth in the morning and at bedtime.   EPINEPHrine 0.3 mg/0.3 mL Soaj injection Commonly known as: EPI-PEN Inject 0.3 mg into the muscle as needed for anaphylaxis.   famotidine 20 MG tablet Commonly known as: PEPCID Take 20 mg by mouth every evening.   furosemide 40 MG tablet Commonly known as: LASIX Take 40 mg by mouth in the morning.   furosemide 20 MG tablet Commonly known as: LASIX TAKE 2 TABLET = 40 MG BY MOUTH ONCE DAILY   loperamide 2 MG tablet Commonly known as: IMODIUM A-D Take 2-4 mg by mouth See admin instructions. Take 4 mg by mouth after 1st loose stool, then 2 mg after each subsequent one. MAX of 16 mg/24 hours   LORazepam 0.5 MG tablet Commonly known as: ATIVAN Take 0.5 mg by mouth every 8 (eight) hours as needed for anxiety.   melatonin 3 MG Tabs tablet Take 3 mg by mouth at bedtime.   metoprolol tartrate 25 MG tablet Commonly known as: LOPRESSOR Take 1 tablet (25 mg total) by mouth 2 (two) times daily. What changed:  how much to take when to take this   Mucinex Fast-Max Congest Cough 2.5-5-100 MG/5ML Liqd Generic drug: Phenylephrine-DM-GG Take 10 mLs by mouth every 4 (four) hours as needed (for chest congestion).   senna 8.6 MG Tabs tablet Commonly known as: SENOKOT Take 2 tablets by mouth daily as needed for moderate constipation.   sodium chloride 1 g tablet TAKE 4 TABLETS = 4 GM BY MOUTH ONCE DAILY What changed: See the new instructions.   tamsulosin 0.4 MG Caps capsule  Commonly known as: FLOMAX Take 0.4 mg by mouth at bedtime.

## 2022-07-12 NOTE — Care Management Important Message (Signed)
Important Message  Patient Details  Name: Adam Swanson. MRN: [id number] Date of Birth: 1928-05-11   Medicare Important Message Given:  Yes     Hannah Beat 07/12/2022, 12:22 PM

## 2022-07-12 NOTE — Progress Notes (Signed)
Pt discharged back to room at [facility] via private vehicle accompanied by son, [name]. Discharge reviewed with son and voicemail left for nurse at facility. Discharge summary received by facility. Pt had no belongings (son had taken them home the day prior).

## 2022-07-12 NOTE — Progress Notes (Signed)
Neurosurgery Service Progress Note  Subjective: No acute events overnight, wants to get out of the hospital  Objective: Vitals:   07/12/22 0340 07/12/22 0530 07/12/22 0600 07/12/22 0729  BP: 92/60  92/66 95/63  Pulse:    66  Resp:  '18 18 16  '$ Temp:   98 F (36.7 C) 98.2 F (36.8 C)  TempSrc:   Oral Oral  SpO2:    96%    Physical Exam: Aox2, stable / baseline R partial suspected tarsorrhaphy, gaze conjugate, stable baseline visual fields, Fcx4 without preference  Assessment & Plan: 88 y.o. man with likely AVF versus AVM and rupture, no further Tx given pt's age and overall health status.  -PT/OT rec SNF -RVR well controlled post amio load, no changes -can return to SNF today  Judith Part  07/12/22 8:01 AM

## 2022-07-12 NOTE — TOC Transition Note (Signed)
Transition of Care [facility]) - CM/SW Discharge Note   Patient Details  Name: Adam Swanson. MRN: [id number] Date of Birth: [birth year]  [facility] CM/SW Contact:  Vinie Sill, LCSW Phone Number: 07/12/2022, 3:46 PM   Clinical Narrative:     Patient will Discharge to: [place] Discharge Date:07/12/2022 Family Notified: son Transport IZ:[id number] car- Please call his son when ready for transport   Per MD patient is ready for discharge. RN, patient, and facility notified of discharge. Discharge Summary sent to facility. RN given number for report Otila Kluver @ [phone]. Ambulance transport requested for patient.   Clinical Social Worker signing off.  Thurmond Butts, MSW, LCSW Clinical Social Worker    Final next level of care: Skilled Nursing Facility Barriers to Discharge: Barriers Resolved   Patient Goals and CMS Choice Patient states their goals for this hospitalization and ongoing recovery are:: Return to snf      Discharge Placement              Patient chooses bed at: [facility] Patient to be transferred to facility by: private car Name of family member notified: son Patient and family notified of of transfer: 07/12/22  Discharge Plan and Services In-house Referral: Clinical Social Work   Post Acute Care Choice: [place]                               Social Determinants of Health (SDOH) Interventions     Readmission Risk Interventions     No data to display

## 2022-07-12 NOTE — Progress Notes (Signed)
Occupational Therapy Treatment Patient Details Name: Adam Swanson. MRN: [id number] DOB: [birth year] Today's Date: 07/12/2022   History of present illness 88 yo male admitted 07/08/22 s/p fall with R parietal ICH with associated small volume SAH, AVF adjacent to the falx on the R. Pt with RVR 10/31 PMH dementia, fall 04/23 with rib fxs,CKD, BPH, GERD, anxiety.   OT comments  Pt in bed upon therapy arrival with lap alarm on. Pt requested to shave and participated in shaving activity at bed level. Min A provided for bed mobility. Total assist to scoot towards [facility] for repositioning. OT will continue to follow patient acutely.    Recommendations for follow up therapy are one component of a multi-disciplinary discharge planning process, led by the attending physician.  Recommendations may be updated based on patient status, additional functional criteria and insurance authorization.    Follow Up Recommendations  Skilled nursing-short term rehab (<3 hours/day)    Assistance Recommended at Discharge Intermittent Supervision/Assistance  Patient can return home with the following  A lot of help with walking and/or transfers;A lot of help with bathing/dressing/bathroom;Help with stairs or ramp for entrance;Assistance with cooking/housework;Assist for transportation;Direct supervision/assist for medications management;Direct supervision/assist for financial management   Equipment Recommendations  None recommended by OT       Precautions / Restrictions Precautions Precautions: Fall Restrictions Weight Bearing Restrictions: No       Mobility Bed Mobility Overal bed mobility: Needs Assistance Bed Mobility: Rolling Rolling: Min assist      Transfers Overall transfer level:  (Not performed)           ADL either performed or assessed with clinical judgement   ADL Overall ADL's : Needs assistance/impaired     Grooming: Maximal assistance;Bed level Grooming Details (indicate cue  type and reason): Pt participated in shaving activity at bed level.          Cognition Arousal/Alertness: Awake/alert Behavior During Therapy: WFL for tasks assessed/performed Overall Cognitive Status: History of cognitive impairments - at baseline         General Comments: Pt perseverating on discharging and how he will get back to [place]. Also worried about his catheter leaking.                   Pertinent Vitals/ Pain       Pain Assessment Pain Assessment: No/denies pain         Frequency  Min 2X/week        Progress Toward Goals  OT Goals(current goals can now be found in the care plan section)  Progress towards OT goals: Progressing toward goals     Plan Discharge plan remains appropriate;Frequency remains appropriate       AM-PAC OT "6 Clicks" Daily Activity     Outcome Measure   Help from another person eating meals?: A Little Help from another person taking care of personal grooming?: A Little Help from another person toileting, which includes using toliet, bedpan, or urinal?: A Little Help from another person bathing (including washing, rinsing, drying)?: A Little Help from another person to put on and taking off regular upper body clothing?: A Little Help from another person to put on and taking off regular lower body clothing?: Total 6 Click Score: 16    End of Session    OT Visit Diagnosis: Unsteadiness on feet (R26.81);Muscle weakness (generalized) (M62.81)   Activity Tolerance Patient tolerated treatment well   Patient Left in bed;with call bell/phone within reach;with bed alarm set  Time: 1534-1610 OT Time Calculation (min): 36 min  Charges: OT General Charges $OT Visit: 1 Visit OT Treatments $Self Care/Home Management : 23-37 mins  Ailene Ravel, OTR/L,CBIS  Supplemental OT - [facility] and [facility]   Iraida Cragin, Clarene Duke 07/12/2022, 4:35 PM

## 2022-07-13 NOTE — Telephone Encounter (Signed)
error 

## 2022-07-16 DIAGNOSIS — F02B4 Dementia in other diseases classified elsewhere, moderate, with anxiety: Secondary | ICD-10-CM | POA: Diagnosis not present

## 2022-07-16 DIAGNOSIS — F064 Anxiety disorder due to known physiological condition: Secondary | ICD-10-CM | POA: Diagnosis not present

## 2022-07-16 DIAGNOSIS — G4700 Insomnia, unspecified: Secondary | ICD-10-CM | POA: Diagnosis not present

## 2022-07-16 DIAGNOSIS — G301 Alzheimer's disease with late onset: Secondary | ICD-10-CM | POA: Diagnosis not present

## 2022-07-18 DIAGNOSIS — F0394 Unspecified dementia, unspecified severity, with anxiety: Secondary | ICD-10-CM | POA: Diagnosis not present

## 2022-07-18 DIAGNOSIS — M6281 Muscle weakness (generalized): Secondary | ICD-10-CM | POA: Diagnosis not present

## 2022-07-18 DIAGNOSIS — R2689 Other abnormalities of gait and mobility: Secondary | ICD-10-CM | POA: Diagnosis not present

## 2022-07-18 DIAGNOSIS — W19XXXA Unspecified fall, initial encounter: Secondary | ICD-10-CM | POA: Diagnosis not present

## 2022-07-18 DIAGNOSIS — Z9181 History of falling: Secondary | ICD-10-CM | POA: Diagnosis not present

## 2022-07-23 DIAGNOSIS — Z9181 History of falling: Secondary | ICD-10-CM | POA: Diagnosis not present

## 2022-07-23 DIAGNOSIS — M6281 Muscle weakness (generalized): Secondary | ICD-10-CM | POA: Diagnosis not present

## 2022-07-23 DIAGNOSIS — R52 Pain, unspecified: Secondary | ICD-10-CM | POA: Diagnosis not present

## 2022-07-23 DIAGNOSIS — W19XXXA Unspecified fall, initial encounter: Secondary | ICD-10-CM | POA: Diagnosis not present

## 2022-07-23 DIAGNOSIS — R2689 Other abnormalities of gait and mobility: Secondary | ICD-10-CM | POA: Diagnosis not present

## 2022-07-27 DIAGNOSIS — R569 Unspecified convulsions: Secondary | ICD-10-CM | POA: Diagnosis not present

## 2022-07-27 DIAGNOSIS — G301 Alzheimer's disease with late onset: Secondary | ICD-10-CM | POA: Diagnosis not present

## 2022-08-07 DIAGNOSIS — U071 COVID-19: Secondary | ICD-10-CM | POA: Diagnosis not present

## 2022-08-07 DIAGNOSIS — I10 Essential (primary) hypertension: Secondary | ICD-10-CM | POA: Diagnosis not present

## 2022-08-07 DIAGNOSIS — D649 Anemia, unspecified: Secondary | ICD-10-CM | POA: Diagnosis not present

## 2022-08-08 DIAGNOSIS — N1831 Chronic kidney disease, stage 3a: Secondary | ICD-10-CM | POA: Diagnosis not present

## 2022-08-08 DIAGNOSIS — U071 COVID-19: Secondary | ICD-10-CM | POA: Diagnosis not present

## 2022-08-08 DIAGNOSIS — D649 Anemia, unspecified: Secondary | ICD-10-CM | POA: Diagnosis not present

## 2022-08-08 DIAGNOSIS — F0394 Unspecified dementia, unspecified severity, with anxiety: Secondary | ICD-10-CM | POA: Diagnosis not present

## 2022-08-13 DIAGNOSIS — G4700 Insomnia, unspecified: Secondary | ICD-10-CM | POA: Diagnosis not present

## 2022-08-13 DIAGNOSIS — G301 Alzheimer's disease with late onset: Secondary | ICD-10-CM | POA: Diagnosis not present

## 2022-08-13 DIAGNOSIS — F064 Anxiety disorder due to known physiological condition: Secondary | ICD-10-CM | POA: Diagnosis not present

## 2022-08-13 DIAGNOSIS — F02B4 Dementia in other diseases classified elsewhere, moderate, with anxiety: Secondary | ICD-10-CM | POA: Diagnosis not present

## 2022-08-16 DIAGNOSIS — R112 Nausea with vomiting, unspecified: Secondary | ICD-10-CM | POA: Diagnosis not present

## 2022-08-16 DIAGNOSIS — F0394 Unspecified dementia, unspecified severity, with anxiety: Secondary | ICD-10-CM | POA: Diagnosis not present

## 2022-08-17 DIAGNOSIS — D649 Anemia, unspecified: Secondary | ICD-10-CM | POA: Diagnosis not present

## 2022-08-17 DIAGNOSIS — N1831 Chronic kidney disease, stage 3a: Secondary | ICD-10-CM | POA: Diagnosis not present

## 2022-08-17 DIAGNOSIS — F0394 Unspecified dementia, unspecified severity, with anxiety: Secondary | ICD-10-CM | POA: Diagnosis not present

## 2022-08-17 DIAGNOSIS — E860 Dehydration: Secondary | ICD-10-CM | POA: Diagnosis not present

## 2022-09-03 ENCOUNTER — Other Ambulatory Visit: Payer: Self-pay

## 2022-09-03 ENCOUNTER — Inpatient Hospital Stay (HOSPITAL_COMMUNITY)
Admission: EM | Admit: 2022-09-03 | Discharge: 2022-09-08 | DRG: 640 | Disposition: A | Discharge status: Skilled Nursing Facility | Payer: Medicare Other | Source: Skilled Nursing Facility | Attending: Internal Medicine | Admitting: Internal Medicine

## 2022-09-03 ENCOUNTER — Emergency Department (HOSPITAL_COMMUNITY): Payer: Medicare Other

## 2022-09-03 ENCOUNTER — Encounter (HOSPITAL_COMMUNITY): Payer: Self-pay | Admitting: Emergency Medicine

## 2022-09-03 DIAGNOSIS — Z9181 History of falling: Secondary | ICD-10-CM | POA: Diagnosis not present

## 2022-09-03 DIAGNOSIS — I639 Cerebral infarction, unspecified: Secondary | ICD-10-CM | POA: Diagnosis not present

## 2022-09-03 DIAGNOSIS — E860 Dehydration: Secondary | ICD-10-CM | POA: Diagnosis present

## 2022-09-03 DIAGNOSIS — I2489 Other forms of acute ischemic heart disease: Secondary | ICD-10-CM | POA: Diagnosis present

## 2022-09-03 DIAGNOSIS — M4000 Postural kyphosis, site unspecified: Secondary | ICD-10-CM | POA: Diagnosis not present

## 2022-09-03 DIAGNOSIS — R410 Disorientation, unspecified: Secondary | ICD-10-CM | POA: Diagnosis not present

## 2022-09-03 DIAGNOSIS — Z515 Encounter for palliative care: Secondary | ICD-10-CM | POA: Diagnosis not present

## 2022-09-03 DIAGNOSIS — W010XXA Fall on same level from slipping, tripping and stumbling without subsequent striking against object, initial encounter: Secondary | ICD-10-CM | POA: Diagnosis not present

## 2022-09-03 DIAGNOSIS — J180 Bronchopneumonia, unspecified organism: Secondary | ICD-10-CM | POA: Diagnosis not present

## 2022-09-03 DIAGNOSIS — R627 Adult failure to thrive: Secondary | ICD-10-CM | POA: Diagnosis present

## 2022-09-03 DIAGNOSIS — Z800 Family history of malignant neoplasm of digestive organs: Secondary | ICD-10-CM | POA: Diagnosis not present

## 2022-09-03 DIAGNOSIS — Z1152 Encounter for screening for COVID-19: Secondary | ICD-10-CM

## 2022-09-03 DIAGNOSIS — K449 Diaphragmatic hernia without obstruction or gangrene: Secondary | ICD-10-CM | POA: Diagnosis present

## 2022-09-03 DIAGNOSIS — I480 Paroxysmal atrial fibrillation: Secondary | ICD-10-CM | POA: Diagnosis not present

## 2022-09-03 DIAGNOSIS — G40909 Epilepsy, unspecified, not intractable, without status epilepticus: Secondary | ICD-10-CM | POA: Diagnosis present

## 2022-09-03 DIAGNOSIS — R2689 Other abnormalities of gait and mobility: Secondary | ICD-10-CM | POA: Diagnosis not present

## 2022-09-03 DIAGNOSIS — Z808 Family history of malignant neoplasm of other organs or systems: Secondary | ICD-10-CM | POA: Diagnosis not present

## 2022-09-03 DIAGNOSIS — R1312 Dysphagia, oropharyngeal phase: Secondary | ICD-10-CM | POA: Diagnosis not present

## 2022-09-03 DIAGNOSIS — Z79899 Other long term (current) drug therapy: Secondary | ICD-10-CM

## 2022-09-03 DIAGNOSIS — I493 Ventricular premature depolarization: Secondary | ICD-10-CM | POA: Diagnosis not present

## 2022-09-03 DIAGNOSIS — Z87898 Personal history of other specified conditions: Secondary | ICD-10-CM | POA: Diagnosis not present

## 2022-09-03 DIAGNOSIS — N1831 Chronic kidney disease, stage 3a: Secondary | ICD-10-CM | POA: Diagnosis present

## 2022-09-03 DIAGNOSIS — Z7401 Bed confinement status: Secondary | ICD-10-CM | POA: Diagnosis not present

## 2022-09-03 DIAGNOSIS — Z801 Family history of malignant neoplasm of trachea, bronchus and lung: Secondary | ICD-10-CM

## 2022-09-03 DIAGNOSIS — R5383 Other fatigue: Secondary | ICD-10-CM | POA: Diagnosis not present

## 2022-09-03 DIAGNOSIS — R4182 Altered mental status, unspecified: Secondary | ICD-10-CM

## 2022-09-03 DIAGNOSIS — I619 Nontraumatic intracerebral hemorrhage, unspecified: Secondary | ICD-10-CM | POA: Diagnosis not present

## 2022-09-03 DIAGNOSIS — I4891 Unspecified atrial fibrillation: Secondary | ICD-10-CM | POA: Diagnosis not present

## 2022-09-03 DIAGNOSIS — N179 Acute kidney failure, unspecified: Secondary | ICD-10-CM

## 2022-09-03 DIAGNOSIS — G9341 Metabolic encephalopathy: Secondary | ICD-10-CM | POA: Diagnosis present

## 2022-09-03 DIAGNOSIS — F0390 Unspecified dementia without behavioral disturbance: Secondary | ICD-10-CM | POA: Diagnosis not present

## 2022-09-03 DIAGNOSIS — Z7189 Other specified counseling: Secondary | ICD-10-CM | POA: Diagnosis not present

## 2022-09-03 DIAGNOSIS — S2242XA Multiple fractures of ribs, left side, initial encounter for closed fracture: Secondary | ICD-10-CM | POA: Diagnosis not present

## 2022-09-03 DIAGNOSIS — Z91030 Bee allergy status: Secondary | ICD-10-CM

## 2022-09-03 DIAGNOSIS — F0280 Dementia in other diseases classified elsewhere without behavioral disturbance: Secondary | ICD-10-CM | POA: Diagnosis not present

## 2022-09-03 DIAGNOSIS — J159 Unspecified bacterial pneumonia: Secondary | ICD-10-CM | POA: Diagnosis not present

## 2022-09-03 DIAGNOSIS — R7989 Other specified abnormal findings of blood chemistry: Secondary | ICD-10-CM | POA: Diagnosis not present

## 2022-09-03 DIAGNOSIS — E870 Hyperosmolality and hypernatremia: Principal | ICD-10-CM | POA: Diagnosis present

## 2022-09-03 DIAGNOSIS — R569 Unspecified convulsions: Secondary | ICD-10-CM | POA: Diagnosis not present

## 2022-09-03 DIAGNOSIS — M150 Primary generalized (osteo)arthritis: Secondary | ICD-10-CM | POA: Diagnosis not present

## 2022-09-03 DIAGNOSIS — Z7901 Long term (current) use of anticoagulants: Secondary | ICD-10-CM

## 2022-09-03 DIAGNOSIS — Z886 Allergy status to analgesic agent status: Secondary | ICD-10-CM

## 2022-09-03 DIAGNOSIS — I482 Chronic atrial fibrillation, unspecified: Secondary | ICD-10-CM | POA: Diagnosis not present

## 2022-09-03 DIAGNOSIS — Z86010 Personal history of colonic polyps: Secondary | ICD-10-CM

## 2022-09-03 DIAGNOSIS — S270XXD Traumatic pneumothorax, subsequent encounter: Secondary | ICD-10-CM | POA: Diagnosis not present

## 2022-09-03 DIAGNOSIS — N189 Chronic kidney disease, unspecified: Secondary | ICD-10-CM

## 2022-09-03 DIAGNOSIS — R54 Age-related physical debility: Secondary | ICD-10-CM | POA: Diagnosis present

## 2022-09-03 DIAGNOSIS — N400 Enlarged prostate without lower urinary tract symptoms: Secondary | ICD-10-CM | POA: Diagnosis not present

## 2022-09-03 DIAGNOSIS — E785 Hyperlipidemia, unspecified: Secondary | ICD-10-CM | POA: Diagnosis not present

## 2022-09-03 DIAGNOSIS — Z4682 Encounter for fitting and adjustment of non-vascular catheter: Secondary | ICD-10-CM | POA: Diagnosis not present

## 2022-09-03 DIAGNOSIS — A419 Sepsis, unspecified organism: Secondary | ICD-10-CM | POA: Diagnosis not present

## 2022-09-03 DIAGNOSIS — R296 Repeated falls: Secondary | ICD-10-CM | POA: Diagnosis not present

## 2022-09-03 DIAGNOSIS — K5900 Constipation, unspecified: Secondary | ICD-10-CM | POA: Diagnosis present

## 2022-09-03 DIAGNOSIS — R531 Weakness: Secondary | ICD-10-CM | POA: Diagnosis not present

## 2022-09-03 DIAGNOSIS — E861 Hypovolemia: Secondary | ICD-10-CM | POA: Diagnosis present

## 2022-09-03 DIAGNOSIS — R41841 Cognitive communication deficit: Secondary | ICD-10-CM | POA: Diagnosis not present

## 2022-09-03 DIAGNOSIS — M6281 Muscle weakness (generalized): Secondary | ICD-10-CM | POA: Diagnosis not present

## 2022-09-03 DIAGNOSIS — R2681 Unsteadiness on feet: Secondary | ICD-10-CM | POA: Diagnosis not present

## 2022-09-03 DIAGNOSIS — S270XXA Traumatic pneumothorax, initial encounter: Secondary | ICD-10-CM | POA: Diagnosis not present

## 2022-09-03 DIAGNOSIS — F429 Obsessive-compulsive disorder, unspecified: Secondary | ICD-10-CM | POA: Diagnosis present

## 2022-09-03 DIAGNOSIS — Z8673 Personal history of transient ischemic attack (TIA), and cerebral infarction without residual deficits: Secondary | ICD-10-CM

## 2022-09-03 DIAGNOSIS — R739 Hyperglycemia, unspecified: Secondary | ICD-10-CM | POA: Diagnosis not present

## 2022-09-03 LAB — RESP PANEL BY RT-PCR (RSV, FLU A&B, COVID)  RVPGX2
Influenza A by PCR: NEGATIVE
Influenza B by PCR: NEGATIVE
Resp Syncytial Virus by PCR: NEGATIVE
SARS Coronavirus 2 by RT PCR: NEGATIVE

## 2022-09-03 LAB — CBC WITH DIFFERENTIAL/PLATELET
Abs Immature Granulocytes: 0.05 10*3/uL (ref 0.00–0.07)
Basophils Absolute: 0 10*3/uL (ref 0.0–0.1)
Basophils Relative: 0 %
Eosinophils Absolute: 0.1 10*3/uL (ref 0.0–0.5)
Eosinophils Relative: 1 %
HCT: 51.8 % (ref 39.0–52.0)
Hemoglobin: 15.7 g/dL (ref 13.0–17.0)
Immature Granulocytes: 0 %
Lymphocytes Relative: 12 %
Lymphs Abs: 1.6 10*3/uL (ref 0.7–4.0)
MCH: 28.9 pg (ref 26.0–34.0)
MCHC: 30.3 g/dL (ref 30.0–36.0)
MCV: 95.2 fL (ref 80.0–100.0)
Monocytes Absolute: 1.3 10*3/uL — ABNORMAL HIGH (ref 0.1–1.0)
Monocytes Relative: 10 %
Neutro Abs: 10.2 10*3/uL — ABNORMAL HIGH (ref 1.7–7.7)
Neutrophils Relative %: 77 %
Platelets: 143 10*3/uL — ABNORMAL LOW (ref 150–400)
RBC: 5.44 MIL/uL (ref 4.22–5.81)
RDW: 21.6 % — ABNORMAL HIGH (ref 11.5–15.5)
WBC: 13.3 10*3/uL — ABNORMAL HIGH (ref 4.0–10.5)
nRBC: 0 % (ref 0.0–0.2)

## 2022-09-03 LAB — BASIC METABOLIC PANEL
Anion gap: 10 (ref 5–15)
BUN: 44 mg/dL — ABNORMAL HIGH (ref 8–23)
CO2: 30 mmol/L (ref 22–32)
Calcium: 9.2 mg/dL (ref 8.9–10.3)
Chloride: 127 mmol/L — ABNORMAL HIGH (ref 98–111)
Creatinine, Ser: 1.89 mg/dL — ABNORMAL HIGH (ref 0.61–1.24)
GFR, Estimated: 33 mL/min — ABNORMAL LOW (ref >60)
Glucose, Bld: 110 mg/dL — ABNORMAL HIGH (ref 70–99)
Potassium: 3.9 mmol/L (ref 3.5–5.1)
Sodium: 167 mmol/L (ref 135–145)

## 2022-09-03 LAB — LITHIUM LEVEL: Lithium Lvl: <0.06 mmol/L — ABNORMAL LOW (ref 0.60–1.20)

## 2022-09-03 LAB — TSH: TSH: 0.872 u[IU]/mL (ref 0.350–4.500)

## 2022-09-03 LAB — TROPONIN I (HIGH SENSITIVITY)
Troponin I (High Sensitivity): 55 ng/L — ABNORMAL HIGH (ref <18)
Troponin I (High Sensitivity): 61 ng/L — ABNORMAL HIGH (ref <18)

## 2022-09-03 LAB — VALPROIC ACID LEVEL: Valproic Acid Lvl: <10 ug/mL — ABNORMAL LOW (ref 50.0–100.0)

## 2022-09-03 LAB — BRAIN NATRIURETIC PEPTIDE: B Natriuretic Peptide: 66.2 pg/mL (ref 0.0–100.0)

## 2022-09-03 MED ORDER — SODIUM CHLORIDE 0.9 % IV BOLUS
500.0000 mL | Freq: Once | INTRAVENOUS | Status: AC
  Administered 2022-09-03: 500 mL via INTRAVENOUS

## 2022-09-03 MED ORDER — LORAZEPAM 2 MG/ML IJ SOLN
0.5000 mg | Freq: Once | INTRAMUSCULAR | Status: AC
  Administered 2022-09-04: 0.5 mg via INTRAVENOUS
  Filled 2022-09-03: qty 1
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
## 2022-09-03 NOTE — Assessment & Plan Note (Signed)
-  Creatinine of 1.89 from prior of 1.07.  -secondary to hypovolemia -continue IV fluid as above

## 2022-09-03 NOTE — Assessment & Plan Note (Signed)
-  continue Depakote

## 2022-09-03 NOTE — H&P (Signed)
History and Physical    Patient: Adam Swanson. LPF:[id number] DOB: 26-Mar-1928 DOA: 09/03/2022 DOS: the patient was seen and examined on 09/04/2022 PCP: Garwin Brothers, MD  Patient coming from: SNF  Chief Complaint:  Chief Complaint  Patient presents with   Fatigue   HPI: Adam Swanson. is a 88 y.o. 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   Patient alert but not oriented. No family at bedside. History obtained by ED documentation and son over the phone. He was sent by SNF by staff for increase fatigue, body ache and had been more lethargic. Had decrease PO intake. Has not been himself since he has not been screaming profanity at staff for past 2 days. Did not recognize son today. Did not want to talk and appears to have trouble talking.   In the ED, afebrile, tachycardic and normotensive on room air.  Has hypernatremia of 167 from prior normal. K of 3.9.  CBG of 110.  Creatinine of 1.89 from prior of 1.07.   Negative flu/COVID/RSV PCR.  CT head scan showed no hemorrhage but was a very limited study due to motion degradation  Troponin elevated to 55.  EKG with LAFB and frequent PVCs. Review of Systems: unable to review all systems due to the inability of the patient to answer questions. Past Medical History:  Diagnosis Date   Anxiety    Colon polyp    hyperplastic   Diverticulosis of colon (without mention of hemorrhage)    Esophageal stricture    GERD (gastroesophageal reflux disease)    Hiatal hernia    Lower extremity weakness    OCD (obsessive compulsive disorder)    Viral hepatitis 08/1983   Past Surgical History:  Procedure Laterality Date   COLONOSCOPY     HEMORRHOID SURGERY     1965   Social History:  reports that he has never smoked. He has never used smokeless  tobacco. He reports that he does not currently use alcohol after a past usage of about 7.0 standard drinks of alcohol per week. He reports that he does not use drugs.  Allergies  Allergen Reactions   Bee Venom Anaphylaxis and Other (See Comments)    Loss of consciousness also   Nsaids Other (See Comments)    "Allergic," per [facility]    Family History  Problem Relation Age of Onset   Diverticulosis Mother    Melanoma Mother    Lung cancer Father    Colon cancer Other        1st cousion.   Allergic rhinitis Neg Hx    Angioedema Neg Hx    Asthma Neg Hx    Eczema Neg Hx    Immunodeficiency Neg Hx    Urticaria Neg Hx     Prior to Admission medications   Medication Sig Start Date End Date Taking? Authorizing Provider  acetaminophen (TYLENOL) 325 MG tablet Take 650 mg by mouth every 6 (six) hours as needed for mild pain (MAX OF 4,000 MG/24 HOURS FROM ALL COMBINED SOURCES OF TYLENOL).    [provider]  demeclocycline (DECLOMYCIN) 150 MG tablet Take 1 tablet (150 mg total) by mouth 2 (two) times daily. Patient taking differently: Take 150 mg by mouth in the morning and at bedtime. Continuously 08/21/19   Renato Shin, MD  DEPAKOTE 250 MG DR tablet Take 250  mg by mouth in the morning and at bedtime. 06/18/22   [provider]  divalproex (DEPAKOTE ER) 250 MG 24 hr tablet Take 1 tablet (250 mg total) by mouth daily. Patient not taking: Reported on 07/09/2022 05/15/22   Marcial Pacas, MD  EPINEPHrine 0.3 mg/0.3 mL IJ SOAJ injection Inject 0.3 mg into the muscle as needed for anaphylaxis. 11/03/21   [provider]  famotidine (PEPCID) 20 MG tablet Take 20 mg by mouth every evening. Patient not taking: Reported on 07/09/2022    [provider]  furosemide (LASIX) 20 MG tablet TAKE 2 TABLET = 40 MG BY MOUTH ONCE DAILY Patient not taking: Reported on 07/09/2022 05/17/21   Renato Shin, MD  furosemide (LASIX) 40 MG tablet Take 40 mg by mouth in the morning.     [provider]  loperamide (IMODIUM A-D) 2 MG tablet Take 2-4 mg by mouth See admin instructions. Take 4 mg by mouth after 1st loose stool, then 2 mg after each subsequent one. MAX of 16 mg/24 hours    [provider]  LORazepam (ATIVAN) 0.5 MG tablet Take 0.5 mg by mouth every 8 (eight) hours as needed for anxiety. 06/05/22   [provider]  melatonin 3 MG TABS tablet Take 3 mg by mouth at bedtime.    [provider]  metoprolol tartrate (LOPRESSOR) 25 MG tablet Take 1 tablet (25 mg total) by mouth 2 (two) times daily. Patient taking differently: Take 12.5 mg by mouth in the morning and at bedtime. 01/12/22 07/09/22  Shahmehdi, Valeria Batman, MD  MUCINEX FAST-MAX CONGEST COUGH 2.5-5-100 MG/5ML LIQD Take 10 mLs by mouth every 4 (four) hours as needed (for chest congestion).    [provider]  senna (SENOKOT) 8.6 MG TABS tablet Take 2 tablets by mouth daily as needed for moderate constipation.    [provider]  sodium chloride 1 g tablet TAKE 4 TABLETS = 4 GM BY MOUTH ONCE DAILY Patient taking differently: Take 3 g by mouth daily. 02/20/21   Renato Shin, MD  tamsulosin (FLOMAX) 0.4 MG CAPS capsule Take 0.4 mg by mouth at bedtime.    [provider]    Physical Exam: Vitals:   09/03/22 2230 09/03/22 2300 09/03/22 2330 09/03/22 2331  BP: 107/76 119/75 109/77   Pulse: 76 (!) 115 (!) 39   Resp: (!) 25 19 (!) 21   Temp:    98.3 F (36.8 C)  TempSrc:    Oral  SpO2: 95% 98% (!) 83%   Height:       Constitutional: NAD, calm, comfortable, elderly male laying in bed in Trendelenburg position Eyes: lids and conjunctivae normal ENMT: Mucous membranes are moist.  Neck: normal, supple Respiratory: clear to auscultation bilaterally, no wheezing, no crackles. Normal respiratory effort. No accessory muscle use.  Cardiovascular: Regular rate and rhythm, no murmurs / rubs / gallops. No extremity edema.  Abdomen: no tenderness, . Bowel sounds  positive.  Musculoskeletal: no clubbing / cyanosis. No joint deformity upper and lower extremities. Muscle wasting on all extremities Skin: no rashes, lesions, ulcers. No induration. No back of sacral pressure ulcers noted Neurologic: CN 2-12 grossly intact.  Psychiatric: alert but not oriented due to dementia. Data Reviewed:  See HPI  Assessment and Plan: * Hypernatremia -secondary to decrease oral intake in the setting of taking sodium tablet and Demeclocycline due to previous hx of SIADH, also on Lasix -Has received 1 L of NS fluid in ED with no change in Na  and worsening Chloride -start IV D5 at 50cc/hr -Trend Na q2hrs  -goal correction of no more than 65mq    A-fib (HCC) -not on anticoagulation due to hx of ICH  Dementia without behavioral disturbance ([place] -Alert but completely disoriented.Per son, at baseline he knows himself, location and family.  -continue depakote   Addendum: Had on agitation later in the night requiring Ativan. RN continues to help re-direct  Acute metabolic encephalopathy -secondary to dehydration, hypernatremia, in the setting of dementia -continue IV fluid treatment as above. -Has hx of ICH just last month however difficult CT head due to motion artifact although no large bleed noted. Also AMS most likely explained by above reasoning so low suspicious of any recurrence.  History of seizure -continue Depakote  Elevated troponin -Troponin of 55. EKG with LAFB and PVC. Likely demand ischemia from hypovolemia. -keep on continuous telemetry  Acute kidney injury superimposed on chronic kidney disease ([place] -Creatinine of 1.89 from prior of 1.07.  -secondary to hypovolemia -continue IV fluid as above      Advance Care Planning: Full-per SNF documentation  Consults: none  Family Communication: Discussed with son, JDarric Planteover the phone  Severity of Illness: The appropriate patient status for this patient is INPATIENT. Inpatient status  is judged to be reasonable and necessary in order to provide the required intensity of service to ensure the patient's safety. The patient's presenting symptoms, physical exam findings, and initial radiographic and laboratory data in the context of their chronic comorbidities is felt to place them at high risk for further clinical deterioration. Furthermore, it is not anticipated that the patient will be medically stable for discharge from the hospital within 2 midnights of admission.   * I certify that at the point of admission it is my clinical judgment that the patient will require inpatient hospital care spanning beyond 2 midnights from the point of admission due to high intensity of service, high risk for further deterioration and high frequency of surveillance required.*  Author: COrene Desanctis DO 09/04/2022 3:10 AM  For on call review [email or web address]

## 2022-09-03 NOTE — Assessment & Plan Note (Addendum)
-  secondary to decrease oral intake in the setting of taking sodium tablet and Demeclocycline due to previous hx of SIADH, also on Lasix -Has received 1 L of NS fluid in ED with no change in Na and worsening Chloride -start IV D5 at 50cc/hr -Trend Na q2hrs  -goal correction of no more than 

## 2022-09-03 NOTE — ED Triage Notes (Addendum)
Pt from [facility].  Staff reports more fatigue, c/o body aches X4 hours.  Staff says he has been sleeping more, did not get up for dinner (into wheelchair).  Hx of seizures but none recently, no falls or hits to the head.  Hx of dementia, usually can answer yes and no but b/c he has not been cussing he "is acting out of the norm for him."  CBG   211 112/68 97% on RA Temp 98.8 Pulse 82

## 2022-09-03 NOTE — Assessment & Plan Note (Addendum)
-  Alert but completely disoriented.Per son, at baseline he knows himself, location and family.  -continue depakote   Addendum: Had on agitation later in the night requiring Ativan. RN continues to help re-direct

## 2022-09-03 NOTE — ED Notes (Signed)
RN aware of pulse 54

## 2022-09-03 NOTE — Assessment & Plan Note (Signed)
-  not on anticoagulation due to hx of ICH

## 2022-09-03 NOTE — Assessment & Plan Note (Addendum)
-  secondary to dehydration, hypernatremia, in the setting of dementia -continue IV fluid treatment as above. -Has hx of ICH just last month however difficult CT head due to motion artifact although no large bleed noted. Also AMS most likely explained by above reasoning so low suspicious of any recurrence.

## 2022-09-03 NOTE — Assessment & Plan Note (Addendum)
-  Troponin of 55. EKG with LAFB and PVC. Likely demand ischemia from hypovolemia. -keep on continuous telemetry

## 2022-09-04 ENCOUNTER — Inpatient Hospital Stay (HOSPITAL_COMMUNITY): Payer: Medicare Other

## 2022-09-04 DIAGNOSIS — E870 Hyperosmolality and hypernatremia: Secondary | ICD-10-CM | POA: Diagnosis not present

## 2022-09-04 LAB — BASIC METABOLIC PANEL
Anion gap: 7 (ref 5–15)
BUN: 42 mg/dL — ABNORMAL HIGH (ref 8–23)
BUN: 43 mg/dL — ABNORMAL HIGH (ref 8–23)
BUN: 34 mg/dL — ABNORMAL HIGH (ref 8–23)
BUN: 37 mg/dL — ABNORMAL HIGH (ref 8–23)
CO2: 25 mmol/L (ref 22–32)
CO2: 31 mmol/L (ref 22–32)
CO2: 23 mmol/L (ref 22–32)
CO2: 27 mmol/L (ref 22–32)
Calcium: 8.6 mg/dL — ABNORMAL LOW (ref 8.9–10.3)
Calcium: 8.8 mg/dL — ABNORMAL LOW (ref 8.9–10.3)
Calcium: 7.2 mg/dL — ABNORMAL LOW (ref 8.9–10.3)
Calcium: 8.7 mg/dL — ABNORMAL LOW (ref 8.9–10.3)
Chloride: >130 mmol/L (ref 98–111)
Chloride: >130 mmol/L (ref 98–111)
Chloride: >130 mmol/L (ref 98–111)
Chloride: 129 mmol/L — ABNORMAL HIGH (ref 98–111)
Creatinine, Ser: 1.87 mg/dL — ABNORMAL HIGH (ref 0.61–1.24)
Creatinine, Ser: 1.7 mg/dL — ABNORMAL HIGH (ref 0.61–1.24)
Creatinine, Ser: 1.44 mg/dL — ABNORMAL HIGH (ref 0.61–1.24)
Creatinine, Ser: 1.54 mg/dL — ABNORMAL HIGH (ref 0.61–1.24)
GFR, Estimated: 33 mL/min — ABNORMAL LOW (ref >60)
GFR, Estimated: 37 mL/min — ABNORMAL LOW (ref >60)
GFR, Estimated: 46 mL/min — ABNORMAL LOW (ref >60)
GFR, Estimated: 42 mL/min — ABNORMAL LOW (ref >60)
Glucose, Bld: 95 mg/dL (ref 70–99)
Glucose, Bld: 108 mg/dL — ABNORMAL HIGH (ref 70–99)
Glucose, Bld: 98 mg/dL (ref 70–99)
Glucose, Bld: 107 mg/dL — ABNORMAL HIGH (ref 70–99)
Potassium: 3.9 mmol/L (ref 3.5–5.1)
Potassium: 4.1 mmol/L (ref 3.5–5.1)
Potassium: 3.6 mmol/L (ref 3.5–5.1)
Potassium: 3.7 mmol/L (ref 3.5–5.1)
Sodium: 167 mmol/L (ref 135–145)
Sodium: 168 mmol/L (ref 135–145)
Sodium: 165 mmol/L (ref 135–145)
Sodium: 163 mmol/L (ref 135–145)

## 2022-09-04 LAB — URINALYSIS, ROUTINE W REFLEX MICROSCOPIC
Bacteria, UA: NONE SEEN
Bilirubin Urine: NEGATIVE
Glucose, UA: NEGATIVE mg/dL
Hgb urine dipstick: NEGATIVE
Ketones, ur: NEGATIVE mg/dL
Leukocytes,Ua: NEGATIVE
Nitrite: NEGATIVE
Protein, ur: NEGATIVE mg/dL
Specific Gravity, Urine: 1.02 (ref 1.005–1.030)
pH: 6 (ref 5.0–8.0)

## 2022-09-04 LAB — T4, FREE: Free T4: 0.96 ng/dL (ref 0.61–1.12)

## 2022-09-04 LAB — CBC
HCT: 51.4 % (ref 39.0–52.0)
Hemoglobin: 14.9 g/dL (ref 13.0–17.0)
MCH: 29 pg (ref 26.0–34.0)
MCHC: 29 g/dL — ABNORMAL LOW (ref 30.0–36.0)
MCV: 100 fL (ref 80.0–100.0)
Platelets: 113 10*3/uL — ABNORMAL LOW (ref 150–400)
RBC: 5.14 MIL/uL (ref 4.22–5.81)
RDW: 21.5 % — ABNORMAL HIGH (ref 11.5–15.5)
WBC: 13.5 10*3/uL — ABNORMAL HIGH (ref 4.0–10.5)
nRBC: 0 % (ref 0.0–0.2)

## 2022-09-04 LAB — MRSA NEXT GEN BY PCR, NASAL: MRSA by PCR Next Gen: NOT DETECTED

## 2022-09-04 LAB — NA AND K (SODIUM & POTASSIUM), RAND UR
Potassium Urine: 51 mmol/L
Sodium, Ur: 40 mmol/L

## 2022-09-04 LAB — SODIUM: Sodium: 164 mmol/L (ref 135–145)

## 2022-09-04 LAB — OSMOLALITY: Osmolality: 363 mOsm/kg (ref 275–295)

## 2022-09-04 MED ORDER — LORAZEPAM 0.5 MG PO TABS
0.5000 mg | ORAL_TABLET | Freq: Three times a day (TID) | ORAL | Status: DC | PRN
  Administered 2022-09-05 – 2022-09-06 (×2): 0.5 mg via ORAL
  Filled 2022-09-04 (×2): qty 1

## 2022-09-04 MED ORDER — VALPROATE SODIUM 100 MG/ML IV SOLN
125.0000 mg | Freq: Four times a day (QID) | INTRAVENOUS | Status: DC
  Administered 2022-09-04 – 2022-09-06 (×8): 125 mg via INTRAVENOUS
  Filled 2022-09-04 (×20): qty 1.25

## 2022-09-04 MED ORDER — ORAL CARE MOUTH RINSE
15.0000 mL | OROMUCOSAL | Status: DC
  Administered 2022-09-04 – 2022-09-08 (×10): 15 mL via OROMUCOSAL

## 2022-09-04 MED ORDER — FREE WATER
200.0000 mL | Status: AC
  Administered 2022-09-04 (×2): 200 mL

## 2022-09-04 MED ORDER — TAMSULOSIN HCL 0.4 MG PO CAPS
0.4000 mg | ORAL_CAPSULE | Freq: Every day | ORAL | Status: DC
  Administered 2022-09-04 – 2022-09-07 (×3): 0.4 mg via ORAL
  Filled 2022-09-04 (×4): qty 1

## 2022-09-04 MED ORDER — IOHEXOL 300 MG/ML  SOLN
50.0000 mL | Freq: Once | INTRAMUSCULAR | Status: AC | PRN
  Administered 2022-09-04: 10 mL

## 2022-09-04 MED ORDER — DEXTROSE 5 % IV SOLN: INTRAVENOUS | Status: AC

## 2022-09-04 MED ORDER — LACTATED RINGERS IV BOLUS
1000.0000 mL | Freq: Once | INTRAVENOUS | Status: AC
  Administered 2022-09-04: 1000 mL via INTRAVENOUS

## 2022-09-04 MED ORDER — DEXTROSE 5 % IV SOLN: INTRAVENOUS | Status: DC

## 2022-09-04 MED ORDER — MELATONIN 3 MG PO TABS
3.0000 mg | ORAL_TABLET | Freq: Every day | ORAL | Status: DC
  Administered 2022-09-04: 3 mg
  Filled 2022-09-04: qty 1

## 2022-09-04 MED ORDER — LIDOCAINE VISCOUS HCL 2 % MT SOLN
15.0000 mL | Freq: Once | OROMUCOSAL | Status: DC
  Filled 2022-09-04: qty 15

## 2022-09-04 MED ORDER — DIVALPROEX SODIUM 250 MG PO DR TAB: 250.0000 mg | DELAYED_RELEASE_TABLET | Freq: Two times a day (BID) | ORAL | Status: DC

## 2022-09-04 MED ORDER — METOPROLOL TARTRATE 12.5 MG HALF TABLET: 12.5000 mg | ORAL_TABLET | Freq: Two times a day (BID) | ORAL | Status: DC

## 2022-09-04 MED ORDER — METOPROLOL TARTRATE 12.5 MG HALF TABLET
12.5000 mg | ORAL_TABLET | Freq: Two times a day (BID) | ORAL | Status: DC
  Administered 2022-09-04: 12.5 mg
  Filled 2022-09-04 (×3): qty 1

## 2022-09-04 MED ORDER — CHLORHEXIDINE GLUCONATE CLOTH 2 % EX PADS
6.0000 | MEDICATED_PAD | Freq: Every day | CUTANEOUS | Status: DC
  Administered 2022-09-04 – 2022-09-05 (×2): 6 via TOPICAL

## 2022-09-04 MED ORDER — MELATONIN 3 MG PO TABS: 3.0000 mg | ORAL_TABLET | Freq: Every day | ORAL | Status: DC

## 2022-09-04 MED ORDER — LIDOCAINE VISCOUS HCL 2 % MT SOLN
OROMUCOSAL | Status: AC
  Filled 2022-09-04: qty 15

## 2022-09-04 MED ORDER — HALOPERIDOL LACTATE 5 MG/ML IJ SOLN
2.0000 mg | Freq: Once | INTRAMUSCULAR | Status: AC
  Administered 2022-09-04: 2 mg via INTRAVENOUS
  Filled 2022-09-04: qty 1

## 2022-09-04 MED ORDER — ORAL CARE MOUTH RINSE: 15.0000 mL | OROMUCOSAL | Status: DC | PRN

## 2022-09-04 NOTE — ED Notes (Signed)
Spoke with IR, and was told to call Diagnostic radiology because that is the department that handles tube placements. Spoke with tammy in diagnostic radiology who stated that she will speak with her radiologist and see whether a tube can be placed or not.

## 2022-09-04 NOTE — ED Notes (Signed)
Admitting provider notified about critical labs

## 2022-09-04 NOTE — ED Notes (Signed)
Pt had pulled off all his monitoring equipment and was refusing to cooperate w/ RN.  Provider aware.

## 2022-09-04 NOTE — Progress Notes (Addendum)
PROGRESS NOTE    Adam Swanson.  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   found to have hypernatremia, sodium 167, AKI  CT head no acute findings, UA unremarkable, respiratory viral panel negative.    Subjective:  Not awake, does not communicate, does not follow commands Very frail, very dehydrated  and malnourished /fat and muscle depletion   Assessment & Plan:  Principal Problem:   Hypernatremia Active Problems:   A-fib (HCC)   Dementia without behavioral disturbance (HCC)   Acute kidney injury superimposed on chronic kidney disease (HCC)   Elevated troponin   History of seizure   Acute metabolic encephalopathy    Assessment and Plan:   * Hypernatremia/acute metabolic encephalopathy -secondary to decrease oral intake in the setting of taking sodium tablet and Demeclocycline due to previous hx of SIADH, also on Lasix --Has received 1 L of NS fluid in ED with no change in Na and worsening Chloride -start IV D5 at 50cc/hr since admission, -ng placement for free water and nutrition/meds, per IR " Tube could not be manipulated out of a large hiatal hernia and is above the diaphragm", elevate head of bed  AKI on CKDII Cr 1.07 in 06/2022 Cr 1.89 on presentation Ua concentrated but no sign of infection Improving Renal dosing meds  A-fib ([place]) -not on anticoagulation due to hx of ICH -Initially on presentation was in A-fib RVR, has since converted to sinus rhythm spontaneously -Continue Lopressor with holding parameters  Elevated troponin -High sensitive troponin troponin of 55-61. EKG with LAFB and PVC. Likely demand  ischemia from hypovolemia.  Seizure disorder Was briefly on Depakote and Lamictal sometime this year has been off for a while  per chart review,last seen neurology in September, was started back on Depakote,  -change  depakote to iv equivalent for now   Dementia without behavioral disturbance ([place]) -Per son, at baseline he knows himself, location and family.   FTT: Appear malnourished with fat and muscle depletion.  Body mass index is 20.28 kg/m. Nutrition consulted Palliative care consult     I have Reviewed nursing notes, Vitals, pain scores, I/o's, Lab results and  imaging results since pt's last encounter, details please see discussion above  I ordered the following labs:  Unresulted Labs (From admission, onward)     Start     Ordered   09/05/22 0500  CBC with Differential/Platelet  Tomorrow morning,   R        09/04/22 1026   09/04/22 2505  Basic metabolic panel  5A & 5P,   R (with TIMED occurrences)      09/04/22 1025   09/04/22 1027  Sodium  Now then every 4 hours,   R (with TIMED occurrences)      09/04/22 1026   09/03/22 2102  Osmolality, urine  Once,   URGENT        09/03/22 2101             DVT prophylaxis: SCDs Start: 09/03/22 2332   Code Status:   Code Status: Full Code  Family Communication: none at bedside  Disposition:    Dispo: The patient is from: snf  Anticipated d/c is to: TBD              Anticipated d/c date is: >48hrs  Antimicrobials:    Anti-infectives (From admission, onward)    None          Objective: Vitals:   09/04/22 1606 09/04/22 1700 09/04/22 1800 09/04/22 2046  BP: 115/88 127/70    Pulse: 68 67 74   Resp: '16 20 17   '$ Temp: (!) 97.4 F (36.3 C) (!) 96.1 F (35.6 C)  (!) 97.3 F (36.3 C)  TempSrc: Axillary Rectal  Rectal  SpO2: 98% 98% 92%   Height:        Intake/Output Summary (Last 24 hours) at 09/04/2022 2303 Last data filed at 09/04/2022 1700 Gross per 24 hour  Intake 1658.47 ml  Output  --  Net 1658.47 ml   There were no vitals filed for this visit.  Examination:  General exam: Does not open eyes, does not communicate, does not follow commands Respiratory system: Clear to auscultation. Respiratory effort normal. Cardiovascular system:  RRR.  Gastrointestinal system: Abdomen is nondistended, soft and nontender.  Normal bowel sounds heard. Central nervous system: Does not open eyes, does not communicate, does not follow commands Extremities:  no edema, appear dehydrated with muscle and fat depletion Skin: No rashes, lesions or ulcers Psychiatry: Currently no agitation    Data Reviewed: I have personally reviewed  labs and visualized  imaging studies since the last encounter and formulate the plan        Scheduled Meds:  Chlorhexidine Gluconate Cloth  6 each Topical Daily   free water  200 mL Per Tube Q2H   lidocaine  15 mL Mouth/Throat Once   melatonin  3 mg Per Tube QHS   metoprolol tartrate  12.5 mg Per Tube BID   mouth rinse  15 mL Mouth Rinse 4 times per day   tamsulosin  0.4 mg Oral QHS   Continuous Infusions:  dextrose 75 mL/hr at 09/04/22 1700   valproate sodium 125 mg (09/04/22 2244)     LOS: 1 day     Florencia Reasons, MD PhD FACP [facility]  Available via Epic secure chat 7am-7pm for nonurgent issues Please page for urgent issues To page the attending provider between 7A-7P or the covering provider during after hours 7P-7A, please log into the web site [email or web address] and access using universal [place] password for that web site. If you do not have the password, please call the hospital operator.    09/04/2022, 11:03 PM

## 2022-09-05 DIAGNOSIS — I4891 Unspecified atrial fibrillation: Secondary | ICD-10-CM | POA: Diagnosis not present

## 2022-09-05 DIAGNOSIS — Z515 Encounter for palliative care: Secondary | ICD-10-CM | POA: Diagnosis not present

## 2022-09-05 DIAGNOSIS — Z7189 Other specified counseling: Secondary | ICD-10-CM | POA: Diagnosis not present

## 2022-09-05 DIAGNOSIS — R4182 Altered mental status, unspecified: Secondary | ICD-10-CM | POA: Diagnosis not present

## 2022-09-05 DIAGNOSIS — E870 Hyperosmolality and hypernatremia: Secondary | ICD-10-CM | POA: Diagnosis not present

## 2022-09-05 DIAGNOSIS — N179 Acute kidney failure, unspecified: Secondary | ICD-10-CM | POA: Diagnosis not present

## 2022-09-05 LAB — CBC WITH DIFFERENTIAL/PLATELET
Abs Immature Granulocytes: 0.05 10*3/uL (ref 0.00–0.07)
Basophils Absolute: 0 10*3/uL (ref 0.0–0.1)
Basophils Relative: 0 %
Eosinophils Absolute: 0.3 10*3/uL (ref 0.0–0.5)
Eosinophils Relative: 5 %
HCT: 41.4 % (ref 39.0–52.0)
Hemoglobin: 12.3 g/dL — ABNORMAL LOW (ref 13.0–17.0)
Immature Granulocytes: 1 %
Lymphocytes Relative: 19 %
Lymphs Abs: 1.4 10*3/uL (ref 0.7–4.0)
MCH: 28.5 pg (ref 26.0–34.0)
MCHC: 29.7 g/dL — ABNORMAL LOW (ref 30.0–36.0)
MCV: 95.8 fL (ref 80.0–100.0)
Monocytes Absolute: 0.8 10*3/uL (ref 0.1–1.0)
Monocytes Relative: 11 %
Neutro Abs: 4.5 10*3/uL (ref 1.7–7.7)
Neutrophils Relative %: 64 %
Platelets: 96 10*3/uL — ABNORMAL LOW (ref 150–400)
RBC: 4.32 MIL/uL (ref 4.22–5.81)
RDW: 20.3 % — ABNORMAL HIGH (ref 11.5–15.5)
WBC: 7 10*3/uL (ref 4.0–10.5)
nRBC: 0 % (ref 0.0–0.2)

## 2022-09-05 LAB — SODIUM
Sodium: 159 mmol/L — ABNORMAL HIGH (ref 135–145)
Sodium: 157 mmol/L — ABNORMAL HIGH (ref 135–145)
Sodium: 158 mmol/L — ABNORMAL HIGH (ref 135–145)
Sodium: 153 mmol/L — ABNORMAL HIGH (ref 135–145)

## 2022-09-05 LAB — BASIC METABOLIC PANEL
Anion gap: 5 (ref 5–15)
Anion gap: 4 — ABNORMAL LOW (ref 5–15)
BUN: 30 mg/dL — ABNORMAL HIGH (ref 8–23)
BUN: 29 mg/dL — ABNORMAL HIGH (ref 8–23)
CO2: 29 mmol/L (ref 22–32)
CO2: 30 mmol/L (ref 22–32)
Calcium: 8.4 mg/dL — ABNORMAL LOW (ref 8.9–10.3)
Calcium: 8.5 mg/dL — ABNORMAL LOW (ref 8.9–10.3)
Chloride: 125 mmol/L — ABNORMAL HIGH (ref 98–111)
Chloride: 120 mmol/L — ABNORMAL HIGH (ref 98–111)
Creatinine, Ser: 1.41 mg/dL — ABNORMAL HIGH (ref 0.61–1.24)
Creatinine, Ser: 1.32 mg/dL — ABNORMAL HIGH (ref 0.61–1.24)
GFR, Estimated: 47 mL/min — ABNORMAL LOW (ref >60)
GFR, Estimated: 51 mL/min — ABNORMAL LOW (ref >60)
Glucose, Bld: 89 mg/dL (ref 70–99)
Glucose, Bld: 119 mg/dL — ABNORMAL HIGH (ref 70–99)
Potassium: 4.3 mmol/L (ref 3.5–5.1)
Potassium: 3.5 mmol/L (ref 3.5–5.1)
Sodium: 159 mmol/L — ABNORMAL HIGH (ref 135–145)
Sodium: 154 mmol/L — ABNORMAL HIGH (ref 135–145)

## 2022-09-05 LAB — OSMOLALITY, URINE: Osmolality, Ur: 550 mOsm/kg (ref 300–900)

## 2022-09-05 MED ORDER — MELATONIN 5 MG PO TABS
5.0000 mg | ORAL_TABLET | Freq: Every day | ORAL | Status: DC
  Administered 2022-09-06 – 2022-09-07 (×2): 5 mg via ORAL
  Filled 2022-09-05 (×3): qty 1

## 2022-09-05 MED ORDER — DEXTROSE 5 % IV SOLN: INTRAVENOUS | Status: DC

## 2022-09-05 MED ORDER — DEXTROSE 5 % IV SOLN: INTRAVENOUS | Status: AC

## 2022-09-05 MED ORDER — MELATONIN 5 MG PO TABS: 5.0000 mg | ORAL_TABLET | Freq: Every day | ORAL | Status: DC

## 2022-09-05 MED ORDER — LACTATED RINGERS IV BOLUS
500.0000 mL | Freq: Once | INTRAVENOUS | Status: AC
  Administered 2022-09-05: 500 mL via INTRAVENOUS

## 2022-09-05 MED ORDER — QUETIAPINE FUMARATE 25 MG PO TABS
25.0000 mg | ORAL_TABLET | Freq: Every evening | ORAL | Status: DC | PRN
  Administered 2022-09-06: 25 mg via ORAL
  Filled 2022-09-05 (×2): qty 1

## 2022-09-05 NOTE — Plan of Care (Signed)

## 2022-09-05 NOTE — Progress Notes (Signed)
Level of care for transfer to 4th floor is med surge.Provider contacted regarding telemetry order. V.O received to discontinue telemetry.

## 2022-09-05 NOTE — Consult Note (Signed)
Consultation Note Date: 09/05/2022   Patient Name: Adam Swanson.  DOB: 1927-10-08  MRN: [id number]  Age / Sex: 88 y.o., male  PCP: Garwin Brothers, MD Referring Physician: Charlynne Cousins, MD  Reason for Consultation: Establishing goals of care  HPI/Patient Profile: 88 y.o. male   admitted on 09/03/2022    Clinical Assessment and Goals of Care: Adam Swanson is a 88 year old gentleman from [facility] facility was initially in independent living and then has required skilled level of nursing care, patient known to palliative medicine service seen by providers in past hospitalizations.  Extensive goals of care discussions were undertaken at that time as well and MOST form was also introduced to.  The patient has history of dementia history of intracranial hemorrhage history of seizures atrial fibrillation he is not on anticoagulation because of falls and history of bleed, history of stage III chronic kidney disease.  Patient admitted to hospital medicine service with hypovolemic hypernatremia, requiring D5 and serial sodium numbers are being tracked.  Palliative medicine consult for ongoing goals of care discussions has been requested. Patient is a frail elderly gentleman resting in bed he is awake and alert he is not agitated he answers a few questions appropriately call placed and I was able to reach patient's son Adam Swanson who also arrived at bedside and family meeting took place for CODE STATUS and broad goals of care discussions.  Life review was also performed. Palliative medicine is specialized medical care for people living with serious illness. It focuses on providing relief from the symptoms and stress of a serious illness. The goal is to improve quality of life for both the patient and the family. Goals of care: Broad aims of medical therapy in relation to the patient's values and preferences. Our aim is to  provide medical care aimed at enabling patients to achieve the goals that matter most to them, given the circumstances of their particular medical situation and their constraints.  Discussed with patient's son Adam Swanson.  Further discussions taken outside the room as well.  Patient's brother Adam Swanson participated on the phone as well.  Concepts specific to CODE STATUS as well as artificial nutrition and hydration explored in detail.  Patient's family recalls these conversations from previous inpatient palliative consultation as well.  See below.  HCPOA Son Adam Swanson [phone].  Present at the bedside other son Adam Swanson.  SUMMARY OF RECOMMENDATIONS    Full code, full scope for now, MOST form given to son Adam Swanson who was at bedside after a family meeting was held. PMT to follow.   Code Status/Advance Care Planning: Full code   Symptom Management:     Palliative Prophylaxis:  Delirium Protocol  Additional Recommendations (Limitations, Scope, Preferences): Full Scope Treatment  Psycho-social/Spiritual:  Desire for further Chaplaincy support:yes Additional Recommendations: Caregiving  Support/Resources  Prognosis:  Unable to determine  Discharge Planning:  recommend palliative services at his current facility upon discharge.        Primary Diagnoses: Present on Admission:  Hypernatremia  A-fib ([place])  Dementia without behavioral disturbance ([place])   I have reviewed the medical record, interviewed the patient and family, and examined the patient. The following aspects are pertinent.  Past Medical History:  Diagnosis Date   Anxiety    Colon polyp    hyperplastic   Diverticulosis of colon (without mention of hemorrhage)    Esophageal stricture    GERD (gastroesophageal reflux disease)    Hiatal hernia    Lower extremity weakness    OCD (obsessive compulsive disorder)    Viral hepatitis 08/1983   Social History   Socioeconomic History   Marital status: Widowed     Spouse name: Not on file   Number of children: 2   Years of education: HS   Highest education level: Not on file  Occupational History   Occupation: Retired  Tobacco Use   Smoking status: Never   Smokeless tobacco: Never  Vaping Use   Vaping Use: Never used  Substance and Sexual Activity   Alcohol use: Not Currently    Alcohol/week: 7.0 standard drinks of alcohol    Types: 7 Standard drinks or equivalent per week    Comment: occasional   Drug use: No   Sexual activity: Not on file  Other Topics Concern   Not on file  Social History Narrative   Lives at home alone.   Right-handed.   No caffeine use.   Social Determinants of Health   Financial Resource Strain: Not on file  Food Insecurity: No Food Insecurity (09/04/2022)   Hunger Vital Sign    Worried About Running Out of Food in the Last Year: Never true    Ran Out of Food in the Last Year: Never true  Transportation Needs: No Transportation Needs (09/04/2022)   PRAPARE - Hydrologist (Medical): No    Lack of Transportation (Non-Medical): No  Physical Activity: Not on file  Stress: Not on file  Social Connections: Not on file   Family History  Problem Relation Age of Onset   Diverticulosis Mother    Melanoma Mother    Lung cancer Father    Colon cancer Other        1st cousion.   Allergic rhinitis Neg Hx    Angioedema Neg Hx    Asthma Neg Hx    Eczema Neg Hx    Immunodeficiency Neg Hx    Urticaria Neg Hx    Scheduled Meds:  Chlorhexidine Gluconate Cloth  6 each Topical Daily   melatonin  5 mg Oral QHS   metoprolol tartrate  12.5 mg Per Tube BID   mouth rinse  15 mL Mouth Rinse 4 times per day   tamsulosin  0.4 mg Oral QHS   Continuous Infusions:  dextrose 50 mL/hr at 09/05/22 0838   valproate sodium Stopped (09/05/22 1046)   PRN Meds:.LORazepam, mouth rinse, QUEtiapine Medications Prior to Admission:  Prior to Admission medications   Medication Sig Start Date End Date  Taking? Authorizing Provider  acetaminophen (TYLENOL) 325 MG tablet Take 650 mg by mouth every 6 (six) hours as needed for mild pain (MAX OF 4,000 MG/24 HOURS FROM ALL COMBINED SOURCES OF TYLENOL).   Yes [provider]  demeclocycline (DECLOMYCIN) 150 MG tablet Take 1 tablet (150 mg total) by mouth 2 (two) times daily. Patient taking differently: Take 150 mg by mouth in the morning and at bedtime. Continuously 08/21/19  Yes Renato Shin, MD  DEPAKOTE 250 MG DR tablet Take 250 mg by  mouth in the morning and at bedtime. 06/18/22  Yes [provider]  EPINEPHrine 0.3 mg/0.3 mL IJ SOAJ injection Inject 0.3 mg into the muscle as needed for anaphylaxis. 11/03/21  Yes [provider]  famotidine (PEPCID) 20 MG tablet Take 20 mg by mouth every evening.   Yes [provider]  furosemide (LASIX) 40 MG tablet Take 40 mg by mouth in the morning.   Yes [provider]  guaiFENesin (ROBITUSSIN) 100 MG/5ML liquid Take 200 mg by mouth every 4 (four) hours as needed for cough.   Yes [provider]  loperamide (IMODIUM A-D) 2 MG tablet Take 2-4 mg by mouth See admin instructions. Take 4 mg by mouth after 1st loose stool, then 2 mg after each subsequent one. MAX of 16 mg/24 hours   Yes [provider]  LORazepam (ATIVAN) 1 MG tablet Take 1 mg by mouth every 12 (twelve) hours as needed for anxiety. 08/25/22 09/08/22 Yes [provider]  melatonin 3 MG TABS tablet Take 3 mg by mouth at bedtime.   Yes [provider]  metoprolol tartrate (LOPRESSOR) 25 MG tablet Take 1 tablet (25 mg total) by mouth 2 (two) times daily. Patient taking differently: Take 12.5 mg by mouth in the morning and at bedtime. 01/12/22 09/04/22 Yes Shahmehdi, Seyed A, MD  OXYGEN Inhale 2 L/min into the lungs as needed (TO MAINTAIN SATS ABOVE 90%).   Yes [provider]  senna (SENOKOT) 8.6 MG TABS tablet Take 2 tablets by mouth daily as needed (for constipation).    Yes [provider]  sodium chloride 1 g tablet TAKE 4 TABLETS = 4 GM BY MOUTH ONCE DAILY Patient taking differently: Take 4 g by mouth daily. 02/20/21  Yes Renato Shin, MD  SUNSCREENS EX Apply 1 application  topically every 2 (two) hours as needed (if exposed to the sun).   Yes [provider]  tamsulosin (FLOMAX) 0.4 MG CAPS capsule Take 0.4 mg by mouth at bedtime.   Yes [provider]  divalproex (DEPAKOTE ER) 250 MG 24 hr tablet Take 1 tablet (250 mg total) by mouth daily. Patient not taking: Reported on 07/09/2022 05/15/22   Marcial Pacas, MD  furosemide (LASIX) 20 MG tablet TAKE 2 TABLET = 40 MG BY MOUTH ONCE DAILY Patient not taking: Reported on 07/09/2022 05/17/21   Renato Shin, MD  LORazepam (ATIVAN) 0.5 MG tablet Take 0.5 mg by mouth every 8 (eight) hours as needed for anxiety. Patient not taking: Reported on 09/04/2022 06/05/22   [provider]   Allergies  Allergen Reactions   Bee Venom Anaphylaxis and Other (See Comments)    Loss of consciousness, also   Nsaids Other (See Comments)    "Allergic," per [facility]   Review of Systems Complains of generalized weakness. Physical Exam Thin and frail elderly gentleman resting in bed Patient appears weak Monitor noted Abdomen is not distended Has some confusion otherwise answers a few questions appropriately Regular work of breathing S1-S2  Vital Signs: BP 93/64 (BP Location: Left Arm)   Pulse 61   Temp (!) 97.3 F (36.3 C) (Axillary)   Resp 14   Ht '5\' 7"'$  (1.702 m)   SpO2 98%   BMI 20.28 kg/m  Pain Scale: 0-10   Pain Score: Asleep   SpO2: SpO2: 98 % O2 Device:SpO2: 98 % O2 Flow Rate: .O2 Flow Rate (L/min): 2 L/min  IO: Intake/output summary:  Intake/Output Summary (Last 24 hours) at 09/05/2022 1401 Last data filed at  09/05/2022 1200 Gross per 24 hour  Intake 4115.38 ml  Output 825 ml  Net 3290.38 ml    LBM: Last BM Date :  (PTA) Baseline Weight:   Most recent weight:        Palliative Assessment/Data:   PPS 50%  Time In:  1330 Time Out:  1430 Time Total:  60  Greater than 50%  of this time was spent counseling and coordinating care related to the above assessment and plan.  Signed by: Loistine Chance, MD   Please contact Palliative Medicine Team phone at [phone] for questions and concerns.  For individual provider: See Shea Evans

## 2022-09-05 NOTE — Evaluation (Signed)
SLP Cancellation Note  Patient Details Name: Adam Swanson. MRN: [id number] DOB: [birth year]   Cancelled treatment:       Reason Eval/Treat Not Completed: Other (comment);Fatigue/lethargy limiting ability to participate  Pt was asleep upon arrival to room. Son, [name], verbalized pt had been restless and he wanted him to rest. Pt woke up when SLP wrote John's number on the board as son will be leaving hospital within a few hours.  John verbally encouraging son to go back to sleep. SLP will continue attempts for evaluation. When pt spoke, his voice was clear albeit weak.    SLP reviewed prior swallow tests (esophagram 2006 showing obstructive dysphagia from cervical osteophytes and MBS 12/2021 during admit with fall - showing severe dysphagia.  [name] stated they had briefly spoken to palliative care in April but pt had improved.    He did verbalize that the pt does not want feeding tube and has progressive weight loss and recurrent hospital admits this year.  Son has MOST form provided by palliative.  Advised he attempt to expedite decision re: pt's code status given pt's h/o dysphagia, progressive weight loss, deconditioning, and prior aspiration.  Note CXR negative upon admit. John advised he would speak to Wille Glaser, (son who resides in [facility] and is also HCPOA) to address.  Of note, pt's most recent BP at noon was 93/64 - now numbers on monitor are lower.   Will re try later- messaged RN.   Kathleen Lime, MS [facility] SLP Acute Rehab Services Office [phone] Pager [phone]  Macario Golds 09/05/2022, 2:59 PM

## 2022-09-05 NOTE — Progress Notes (Signed)
Initial Nutrition Assessment  DOCUMENTATION CODES:   Not applicable  INTERVENTION:  - Regular diet per MD.  - If patient desires can consider adding supplements.   - Per discussion with MD, no plan for tube feeds at this time.  - If plan of care changes, please re-consult for TF recommendations.   NUTRITION DIAGNOSIS:   Increased nutrient needs related to acute illness as evidenced by estimated needs.  GOAL:   Patient will meet greater than or equal to 90% of their needs  MONITOR:   PO intake, Weight trends  REASON FOR ASSESSMENT:   Consult Enteral/tube feeding initiation and management  ASSESSMENT:   88 y.o. male PMH of dementia, with history of intracranial hemorrhage due to the AVM, history of seizures, atrial fibrillation on anticoagulation,CKD III, AA BPH SIADH on sodium tablets and demeclocycline who presented with AMS and decreased oral intake found to be hyponatremic sodium 167 and acute kidney injury.  12/25 Admit 12/26: small bore feeding tube placed in L nare 12/27 feeding tube removed in AM, palliative consulted   Patient sleeping peacefully at time of visit, no family or other visitors at bedside.   SLP note today reports that patient's son had stated patient has been having progressive weight loss. Current weight status and weight trends difficult to assess because patient had not had a weight taken since September (no weight taken this admission).  Son also informed SLP (per note) that patient did not want a feeding tube.  Palliative Care note from today indicates patient is full code and full scope at this time. Palliative continuing to follow patient.   Per discussion with Internal Medicine MD this morning, no plan for tube feeds at this time.  Patient is noted to be ordered a Regular diet. Per discussion with RN, patient eating ~50% of meals today. RN does not feel patient would drink supplements.   Medications reviewed and include: D5W at 80m/hr  (provides 204 kcals over 24 hours)  Labs reviewed:  Creatinine 1.41    Latest Reference Range & Units 09/04/22 17:22 09/05/22 03:37 09/05/22 04:54 09/05/22 09:21 09/05/22 14:04  Sodium 135 - 145 mmol/L 163 (HH) 159 (H) 159 (H) 157 (H) 158 (H)  (HH): Data is critically high (H): Data is abnormally high   NUTRITION - FOCUSED PHYSICAL EXAM:  Deferred  Diet Order:   Diet Order             Diet regular Room service appropriate? Yes; Fluid consistency: Thin  Diet effective now                   EDUCATION NEEDS:  Not appropriate for education at this time  Skin:  Skin Assessment: Reviewed RN Assessment  Last BM:  PTA  Height:  Ht Readings from Last 1 Encounters:  09/03/22 '5\' 7"'$  (1.702 m)   Weight:  Wt Readings from Last 1 Encounters:  05/15/22 58.7 kg   Ideal Body Weight:  67.27 kg  BMI:  Body mass index is 20.28 kg/m.  Estimated Nutritional Needs:  *Utilizing weight from 9/5 (last recorded weight) Kcal:  1550-1700 kcals Protein:  70-90 grams Fluid:  >/= 1.5L    ASamson FredericRD, LDN For contact information, refer to [facility]

## 2022-09-05 NOTE — Progress Notes (Signed)
[facility] PROGRESS NOTE    Progress Note  Adam Swanson.  LHT:[id number] DOB: 1928-01-09 DOA: 09/03/2022 PCP: Garwin Brothers, MD     Brief Narrative:   Adam Swanson. is an 88 y.o. 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 Diuretics were held he was started on D5 and nutritional replacement free water. Started on D5 sodium is improving nicely. Sodium is improving quite quickly decrease D5 recheck basic metabolic panel in the morning, recheck sodiums every 4.  Chronic atrial fibrillation: not on anticoagulation due to history of intracranial hemorrhage rate controlled.  Dementia without behavioral disturbances: He is alert but completely disoriented continue Depakote.   Melatonin and Seroquel at night.  Acute metabolic encephalopathy: In the setting of dementia and hypernatremia.  History of seizures: Continue Depakote.  Elevated troponins: Denies chest pain shortness of breath likely due to hypovolemia.  Acute kidney injury on chronic kidney disease stage III A: With baseline 1.0, on admission 1.8 likely due to hypovolemia. Lasix was held he was started on IV fluids his creatinine is coming down nicely.     DVT prophylaxis: lovenox Family Communication:none Status is: Inpatient Remains inpatient appropriate because: Acute metabolic encephalopathy due to hyponatremia    Code  Status:     Code Status Orders  (From admission, onward)           Start     Ordered   09/03/22 2332  Full code  Continuous       Question:  By:  Answer:  Consent: discussion documented in EHR   09/03/22 2332           Code Status History     Date Active Date Inactive Code Status Order ID Comments User Context   07/09/2022 0448 07/12/2022 2252 Full Code [id number]  Judith Part, MD Inpatient   01/09/2022 0330 01/13/2022 0217 Full Code [id number]  Etta Quill, DO ED   12/26/2021 2355 01/02/2022 1754 Full Code [id number]  Lenore Cordia, MD ED   09/24/2015 0533 09/26/2015 1842 Full Code [id number]  Toy Baker, MD ED      Advance Directive Documentation    Flowsheet Row Most Recent Value  Type of Advance Directive Healthcare Power of Attorney  Pre-existing out of facility DNR order (yellow form or pink MOST form) --  "MOST" Form in Place? --         IV Access:   Peripheral IV   Procedures and diagnostic studies:   DG Naso G Tube Plc W/Fl W/Rad  Result Date: 09/04/2022 CLINICAL DATA:  Poor oral intake. Hyponatremia. Field nasogastric tube placement at the bedside EXAM: NASO G TUBE PLACEMENT WITH FL AND WITH RAD CONTRAST:  Approximately 10 cc of Omnipaque 300 per tube. FLUOROSCOPY: Fluoroscopy Time:  2 minutes and 54 seconds Radiation Exposure Index (if provided by the fluoroscopic device): 13.8 mGy COMPARISON:  Abdominal radiographs same date and chest radiographs 09/03/2022. Abdominal CT 11/06/2015. FINDINGS: Lubricated nasogastric tube was inserted  per the left nares and advanced into the chest. The tube repeatedly deviated towards the left at the level of the mid mediastinum, although did not appear to be within the left mainstem bronchus, and the patient was able to maintain speech. Based on prior imaging, the tube was felt to be within a large hiatal hernia. This was confirmed by injecting a small amount of air, and subsequently a small amount of contrast  into the tube. This confirms location within a hiatal hernia. Additional air was injected to distend the intrathoracic stomach and aid advancement of the tube. Despite these maneuvers, I was unable to advance the tube through the diaphragmatic hiatus. Most of the stomach appears to be within the left lower chest. The nasogastric tube was taped in place for use at this level if clinically desired. If this position does not satisfy needs, consider follow-up radiographs in approximately 12 hours to assess for spontaneous advancement prior to removal. IMPRESSION: 1. Nasogastric tube placement under fluoroscopy. Tube could not be manipulated out of a large hiatal hernia and did not pass below the diaphragm. 2. These results were discussed by telephone at the time of interpretation on 09/04/2022 at 4:00 pm with provider Florencia Reasons , who verbally acknowledged these results. Electronically Signed   By: Richardean Sale M.D.   On: 09/04/2022 16:16   DG Abd Portable 1V  Result Date: 09/04/2022 CLINICAL DATA:  Evaluate NG tube. EXAM: PORTABLE ABDOMEN - 1 VIEW COMPARISON:  None Available. FINDINGS: The NG tube is not visualized suggesting it is in the mouth or pharynx. The cardiomediastinal silhouette is stable. No pneumothorax. No other acute interval changes. IMPRESSION: The NG tube is not visualized suggesting it is in the mouth or pharynx. Recommend repositioning. Electronically Signed   By: Dorise Bullion III M.D.   On: 09/04/2022 12:57   CT Head Wo Contrast  Result Date: 09/03/2022 CLINICAL DATA:  Delirium. EXAM: CT HEAD WITHOUT CONTRAST TECHNIQUE: Contiguous axial images were obtained from the base of the skull through the vertex without intravenous contrast. RADIATION DOSE REDUCTION: This exam was performed according to the departmental dose-optimization program which includes automated exposure control, adjustment of the mA and/or kV according to patient size and/or use of iterative reconstruction technique.  COMPARISON:  Head CT dated 07/08/2022. FINDINGS: Evaluation of this exam is very limited, almost nondiagnostic, due to severe motion artifact. Brain: Moderate age-related atrophy and chronic microvascular ischemic changes. No definite acute intracranial hemorrhage. No mass effect or midline shift. No extra-axial fluid collection. Vascular: No hyperdense vessel or unexpected calcification. Skull: Normal. Negative for fracture or focal lesion. Sinuses/Orbits: No acute finding. Other: None IMPRESSION: No obvious large intracranial hemorrhage on a very limited exam. Electronically Signed   By: Anner Crete M.D.   On: 09/03/2022 22:05   DG Chest Port 1 View  Result Date: 09/03/2022 CLINICAL DATA:  Fatigue EXAM: PORTABLE CHEST 1 VIEW COMPARISON:  07/08/2022 FINDINGS: Limited exam secondary to patient rotation. Stable mild cardiomegaly. Moderate hiatal hernia. No definite airspace consolidation. No pleural effusion or pneumothorax. Bones are demineralized. IMPRESSION: Limited exam secondary to patient rotation. No acute cardiopulmonary findings. Electronically Signed   By: Davina Poke D.O.   On: 09/03/2022 20:24     Medical Consultants:   None.   Subjective:    Adam Swanson. no complaints this morning pleasantly confused  Objective:    Vitals:   09/05/22 0253 09/05/22 0300 09/05/22 0409 09/05/22 0500  BP: (!) 89/67 (S) (!) 71/59 (!) 71/59 Marland Kitchen)  85/64  Pulse:  62  63  Resp:  10  19  Temp:   97.9 F (36.6 C)   TempSrc:   Axillary   SpO2:  97%  98%  Height:       SpO2: 98 % O2 Flow Rate (L/min): 2 L/min   Intake/Output Summary (Last 24 hours) at 09/05/2022 0752 Last data filed at 09/05/2022 0650 Gross per 24 hour  Intake 3915.38 ml  Output 425 ml  Net 3490.38 ml   There were no vitals filed for this visit.  Exam: General exam: In no acute distress. Respiratory system: Good air movement and clear to auscultation. Cardiovascular system: S1 & S2 heard, RRR. No  JVD. Gastrointestinal system: Abdomen is nondistended, soft and nontender.  Extremities: No pedal edema. Skin: No rashes, lesions or ulcers Psychiatry: Judgement and insight appear normal.  Pleasantly confused   Data Reviewed:    Labs: Basic Metabolic Panel: Recent Labs  Lab 09/04/22 0207 09/04/22 0749 09/04/22 0921 09/04/22 1142 09/04/22 1722 09/05/22 0337 09/05/22 0454  NA 167* 168* 165* 164* 163* 159* 159*  K 3.9 4.1 3.6  --  3.7  --  4.3  CL >130* >130* >130*  --  129*  --  125*  CO2 '25 31 23  '$ --  27  --  29  GLUCOSE 95 108* 98  --  107*  --  89  BUN 42* 43* 34*  --  37*  --  30*  CREATININE 1.87* 1.70* 1.44*  --  1.54*  --  1.41*  CALCIUM 8.6* 8.8* 7.2*  --  8.7*  --  8.4*   GFR CrCl cannot be calculated (Unknown ideal weight.). Liver Function Tests: No results for input(s): "AST", "ALT", "ALKPHOS", "BILITOT", "PROT", "ALBUMIN" in the last 168 hours. No results for input(s): "LIPASE", "AMYLASE" in the last 168 hours. No results for input(s): "AMMONIA" in the last 168 hours. Coagulation profile No results for input(s): "INR", "PROTIME" in the last 168 hours. COVID-19 Labs  No results for input(s): "DDIMER", "FERRITIN", "LDH", "CRP" in the last 72 hours.  Lab Results  Component Value Date   SARSCOV2NAA NEGATIVE 09/03/2022   [place] NEGATIVE 06/01/2021    CBC: Recent Labs  Lab 09/03/22 2012 09/04/22 0207  WBC 13.3* 13.5*  NEUTROABS 10.2*  --   HGB 15.7 14.9  HCT 51.8 51.4  MCV 95.2 100.0  PLT 143* 113*   Cardiac Enzymes: No results for input(s): "CKTOTAL", "CKMB", "CKMBINDEX", "TROPONINI" in the last 168 hours. BNP (last 3 results) No results for input(s): "PROBNP" in the last 8760 hours. CBG: No results for input(s): "GLUCAP" in the last 168 hours. D-Dimer: No results for input(s): "DDIMER" in the last 72 hours. Hgb A1c: No results for input(s): "HGBA1C" in the last 72 hours. Lipid Profile: No results for input(s): "CHOL", "HDL",  "LDLCALC", "TRIG", "CHOLHDL", "LDLDIRECT" in the last 72 hours. Thyroid function studies: Recent Labs    09/03/22 2010  TSH 0.872   Anemia work up: No results for input(s): "VITAMINB12", "FOLATE", "FERRITIN", "TIBC", "IRON", "RETICCTPCT" in the last 72 hours. Sepsis Labs: Recent Labs  Lab 09/03/22 2012 09/04/22 0207  WBC 13.3* 13.5*   Microbiology Recent Results (from the past 240 hour(s))  Resp panel by RT-PCR (RSV, Flu A&B, Covid) Anterior Nasal Swab     Status: None   Collection Time: 09/03/22  8:40 PM   Specimen: Anterior Nasal Swab  Result Value Ref Range Status   SARS Coronavirus 2 by RT PCR NEGATIVE NEGATIVE Final  Comment: (NOTE) SARS-CoV-2 target nucleic acids are NOT DETECTED.  The SARS-CoV-2 RNA is generally detectable in upper respiratory specimens during the acute phase of infection. The lowest concentration of SARS-CoV-2 viral copies this assay can detect is 138 copies/mL. A negative result does not preclude SARS-Cov-2 infection and should not be used as the sole basis for treatment or other patient management decisions. A negative result may occur with  improper specimen collection/handling, submission of specimen other than nasopharyngeal swab, presence of viral mutation(s) within the areas targeted by this assay, and inadequate number of viral copies(<138 copies/mL). A negative result must be combined with clinical observations, patient history, and epidemiological information. The expected result is Negative.  Fact Sheet for Patients:  EntrepreneurPulse.com.au  Fact Sheet for Healthcare Providers:  IncredibleEmployment.be  This test is no t yet approved or cleared by the [place] FDA and  has been authorized for detection and/or diagnosis of SARS-CoV-2 by FDA under an Emergency Use Authorization (EUA). This EUA will remain  in effect (meaning this test can be used) for the duration of the COVID-19 declaration  under Section 564(b)(1) of the Act, 21 U.S.C.section 360bbb-3(b)(1), unless the authorization is terminated  or revoked sooner.       Influenza A by PCR NEGATIVE NEGATIVE Final   Influenza B by PCR NEGATIVE NEGATIVE Final    Comment: (NOTE) The Xpert Xpress SARS-CoV-2/FLU/RSV plus assay is intended as an aid in the diagnosis of influenza from Nasopharyngeal swab specimens and should not be used as a sole basis for treatment. Nasal washings and aspirates are unacceptable for Xpert Xpress SARS-CoV-2/FLU/RSV testing.  Fact Sheet for Patients: EntrepreneurPulse.com.au  Fact Sheet for Healthcare Providers: IncredibleEmployment.be  This test is not yet approved or cleared by the [place] FDA and has been authorized for detection and/or diagnosis of SARS-CoV-2 by FDA under an Emergency Use Authorization (EUA). This EUA will remain in effect (meaning this test can be used) for the duration of the COVID-19 declaration under Section 564(b)(1) of the Act, 21 U.S.C. section 360bbb-3(b)(1), unless the authorization is terminated or revoked.     Resp Syncytial Virus by PCR NEGATIVE NEGATIVE Final    Comment: (NOTE) Fact Sheet for Patients: EntrepreneurPulse.com.au  Fact Sheet for Healthcare Providers: IncredibleEmployment.be  This test is not yet approved or cleared by the [place] FDA and has been authorized for detection and/or diagnosis of SARS-CoV-2 by FDA under an Emergency Use Authorization (EUA). This EUA will remain in effect (meaning this test can be used) for the duration of the COVID-19 declaration under Section 564(b)(1) of the Act, 21 U.S.C. section 360bbb-3(b)(1), unless the authorization is terminated or revoked.  Performed at [facility], [place] [address], [place], [place] [address]   MRSA Next Gen by PCR, Nasal     Status: None   Collection Time: 09/04/22  4:50 PM    Specimen: Nasal Mucosa; Nasal Swab  Result Value Ref Range Status   MRSA by PCR Next Gen NOT DETECTED NOT DETECTED Final    Comment: (NOTE) The GeneXpert MRSA Assay (FDA approved for NASAL specimens only), is one component of a comprehensive MRSA colonization surveillance program. It is not intended to diagnose MRSA infection nor to guide or monitor treatment for MRSA infections. Test performance is not FDA approved in patients less than 24 years old. Performed at [facility], [place] [address] [place], [place] [address]      Medications:    Chlorhexidine Gluconate Cloth  6 each Topical Daily  lidocaine  15 mL Mouth/Throat Once   melatonin  3 mg Per Tube QHS   metoprolol tartrate  12.5 mg Per Tube BID   mouth rinse  15 mL Mouth Rinse 4 times per day   tamsulosin  0.4 mg Oral QHS   Continuous Infusions:  dextrose 75 mL/hr at 09/05/22 0708   valproate sodium Stopped (09/05/22 0542)      LOS: 2 days   Charlynne Cousins  [facility]  09/05/2022, 7:52 AM

## 2022-09-06 DIAGNOSIS — R4182 Altered mental status, unspecified: Secondary | ICD-10-CM | POA: Diagnosis not present

## 2022-09-06 DIAGNOSIS — N179 Acute kidney failure, unspecified: Secondary | ICD-10-CM | POA: Diagnosis not present

## 2022-09-06 DIAGNOSIS — E870 Hyperosmolality and hypernatremia: Secondary | ICD-10-CM | POA: Diagnosis not present

## 2022-09-06 LAB — SODIUM
Sodium: 152 mmol/L — ABNORMAL HIGH (ref 135–145)
Sodium: 151 mmol/L — ABNORMAL HIGH (ref 135–145)
Sodium: 150 mmol/L — ABNORMAL HIGH (ref 135–145)
Sodium: 150 mmol/L — ABNORMAL HIGH (ref 135–145)
Sodium: 149 mmol/L — ABNORMAL HIGH (ref 135–145)
Sodium: 146 mmol/L — ABNORMAL HIGH (ref 135–145)

## 2022-09-06 LAB — BASIC METABOLIC PANEL
Anion gap: 4 — ABNORMAL LOW (ref 5–15)
BUN: 23 mg/dL (ref 8–23)
CO2: 30 mmol/L (ref 22–32)
Calcium: 8.2 mg/dL — ABNORMAL LOW (ref 8.9–10.3)
Chloride: 118 mmol/L — ABNORMAL HIGH (ref 98–111)
Creatinine, Ser: 1.23 mg/dL (ref 0.61–1.24)
GFR, Estimated: 55 mL/min — ABNORMAL LOW (ref >60)
Glucose, Bld: 94 mg/dL (ref 70–99)
Potassium: 3.2 mmol/L — ABNORMAL LOW (ref 3.5–5.1)
Sodium: 152 mmol/L — ABNORMAL HIGH (ref 135–145)

## 2022-09-06 MED ORDER — HALOPERIDOL LACTATE 5 MG/ML IJ SOLN
1.0000 mg | Freq: Four times a day (QID) | INTRAMUSCULAR | Status: DC | PRN
  Administered 2022-09-06 (×2): 1 mg via INTRAVENOUS
  Filled 2022-09-06 (×2): qty 1

## 2022-09-06 MED ORDER — METOPROLOL TARTRATE 12.5 MG HALF TABLET
12.5000 mg | ORAL_TABLET | Freq: Two times a day (BID) | ORAL | Status: DC
  Administered 2022-09-06 – 2022-09-07 (×2): 12.5 mg via ORAL
  Filled 2022-09-06 (×4): qty 1

## 2022-09-06 MED ORDER — POTASSIUM CHLORIDE 20 MEQ PO PACK
40.0000 meq | PACK | Freq: Two times a day (BID) | ORAL | Status: AC
  Administered 2022-09-06 (×2): 40 meq via ORAL
  Filled 2022-09-06 (×2): qty 2

## 2022-09-06 MED ORDER — VALPROATE SODIUM 100 MG/ML IV SOLN
125.0000 mg | Freq: Four times a day (QID) | INTRAVENOUS | Status: AC
  Administered 2022-09-06 – 2022-09-07 (×4): 125 mg via INTRAVENOUS
  Filled 2022-09-06 (×11): qty 1.25

## 2022-09-06 NOTE — Progress Notes (Signed)
[facility] PROGRESS NOTE    Progress Note  Adam Swanson.  BMW:[id number] DOB: Oct 30, 1927 DOA: 09/03/2022 PCP: Garwin Brothers, MD     Brief Narrative:   Adam Swanson. is an 88 y.o. 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 Continue D5W continue check sodium every 4 hours.  Chronic atrial fibrillation: not on anticoagulation due to history of intracranial hemorrhage rate controlled.  Dementia without behavioral disturbances: He is alert but completely disoriented continue Depakote.   Melatonin and Seroquel at night.  Acute metabolic encephalopathy: In the setting of dementia and hypernatremia.  History of seizures: Continue Depakote.  Elevated troponins: Denies chest pain shortness of breath likely due to hypovolemia.  Acute kidney injury on chronic kidney disease stage III A: With baseline 1.0, on admission 1.8 likely due to hypovolemia. Continue IV fluids creatinine is improving nicely.     DVT prophylaxis: lovenox Family Communication:none Status is: Inpatient Remains inpatient appropriate because: Acute metabolic encephalopathy due to hyponatremia    Code Status:     Code Status Orders  (From admission, onward)           Start     Ordered   09/03/22 2332  Full code  Continuous       Question:  By:  Answer:  Consent: discussion documented in EHR   09/03/22 2332            Code Status History     Date Active Date Inactive Code Status Order ID Comments User Context   07/09/2022 0448 07/12/2022 2252 Full Code [id number]  Judith Part, MD Inpatient   01/09/2022 0330 01/13/2022 0217 Full Code [id number]  Etta Quill, DO ED   12/26/2021 2355 01/02/2022 1754 Full Code [id number]  Lenore Cordia, MD ED   09/24/2015 0533 09/26/2015 1842 Full Code [id number]  Toy Baker, MD ED      Advance Directive Documentation    Flowsheet Row Most Recent Value  Type of Advance Directive Healthcare Power of Attorney  Pre-existing out of facility DNR order (yellow form or pink MOST form) --  "MOST" Form in Place? --         IV Access:   Peripheral IV   Procedures and diagnostic studies:   DG Naso G Tube Plc W/Fl W/Rad  Result Date: 09/04/2022 CLINICAL DATA:  Poor oral intake. Hyponatremia. Field nasogastric tube placement at the bedside EXAM: NASO G TUBE PLACEMENT WITH FL AND WITH RAD CONTRAST:  Approximately 10 cc of Omnipaque 300 per tube. FLUOROSCOPY: Fluoroscopy Time:  2 minutes and 54 seconds Radiation Exposure Index (if provided by the fluoroscopic device): 13.8 mGy COMPARISON:  Abdominal radiographs same date and chest radiographs 09/03/2022. Abdominal CT 11/06/2015. FINDINGS: Lubricated nasogastric tube was inserted per the left nares and advanced into the chest. The tube repeatedly deviated towards the left at the level of the mid mediastinum, although did not appear to be within the left mainstem bronchus, and the patient was  able to maintain speech. Based on prior imaging, the tube was felt to be within a large hiatal hernia. This was confirmed by injecting a small amount of air, and subsequently a small amount of contrast into the tube. This confirms location within a hiatal hernia. Additional air was injected to distend the intrathoracic stomach and aid advancement of the tube. Despite these maneuvers, I was unable to advance the tube through  the diaphragmatic hiatus. Most of the stomach appears to be within the left lower chest. The nasogastric tube was taped in place for use at this level if clinically desired. If this position does not satisfy needs, consider follow-up radiographs in approximately 12 hours to assess for spontaneous advancement prior to removal. IMPRESSION: 1. Nasogastric tube placement under fluoroscopy. Tube could not be manipulated out of a large hiatal hernia and did not pass below the diaphragm. 2. These results were discussed by telephone at the time of interpretation on 09/04/2022 at 4:00 pm with provider Florencia Reasons , who verbally acknowledged these results. Electronically Signed   By: Richardean Sale M.D.   On: 09/04/2022 16:16   DG Abd Portable 1V  Result Date: 09/04/2022 CLINICAL DATA:  Evaluate NG tube. EXAM: PORTABLE ABDOMEN - 1 VIEW COMPARISON:  None Available. FINDINGS: The NG tube is not visualized suggesting it is in the mouth or pharynx. The cardiomediastinal silhouette is stable. No pneumothorax. No other acute interval changes. IMPRESSION: The NG tube is not visualized suggesting it is in the mouth or pharynx. Recommend repositioning. Electronically Signed   By: Dorise Bullion III M.D.   On: 09/04/2022 12:57     Medical Consultants:   None.   Subjective:    Adam Swanson. pleasantly confused this.  Objective:    Vitals:   09/05/22 2202 09/06/22 0157 09/06/22 0500 09/06/22 0903  BP: (!) 80/51 (!) 84/60 (!) 90/58 (!) 89/56  Pulse: 68   (!) 58  Resp: '18 17  15  '$ Temp: 98.4 F (36.9 C) 98.5 F (36.9 C) (!) 97.2 F (36.2 C) 98.1 F (36.7 C)  TempSrc: Axillary Oral Oral   SpO2: 100% 100% 95% 100%  Weight: 56 kg     Height: '5\' 7"'$  (1.702 m)      SpO2: 100 % O2 Flow Rate (L/min): 2 L/min   Intake/Output Summary (Last 24 hours) at 09/06/2022 1124 Last data filed at 09/06/2022 0900 Gross per 24 hour  Intake 1611.07 ml  Output 375 ml  Net 1236.07 ml    Filed Weights   09/05/22  2202  Weight: 56 kg    Exam: General exam: In no acute distress. Respiratory system: Good air movement and clear to auscultation. Cardiovascular system: S1 & S2 heard, RRR. No JVD. Gastrointestinal system: Abdomen is nondistended, soft and nontender.  Extremities: No pedal edema. Skin: No rashes, lesions or ulcers Psychiatry: Judgement and insight appear normal. Mood & affect appropriate. Data Reviewed:    Labs: Basic Metabolic Panel: Recent Labs  Lab 09/04/22 0921 09/04/22 1142 09/04/22 1722 09/05/22 0337 09/05/22 0454 09/05/22 0921 09/05/22 1824 09/05/22 2221 09/06/22 0338 09/06/22 0447 09/06/22 0842  NA 165*   < > 163*   < > 159*   < > 154* 153* 152* 152* 151*  K 3.6  --  3.7  --  4.3  --  3.5  --   --  3.2*  --   CL >130*  --  129*  --  125*  --  120*  --   --  118*  --   CO2 23  --  27  --  29  --  30  --   --  30  --   GLUCOSE 98  --  107*  --  89  --  119*  --   --  94  --   BUN 34*  --  37*  --  30*  --  29*  --   --  23  --   CREATININE 1.44*  --  1.54*  --  1.41*  --  1.32*  --   --  1.23  --   CALCIUM 7.2*  --  8.7*  --  8.4*  --  8.5*  --   --  8.2*  --    < > = values in this interval not displayed.    GFR Estimated Creatinine Clearance: 30.4 mL/min (by C-G formula based on SCr of 1.23 mg/dL). Liver Function Tests: No results for input(s): "AST", "ALT", "ALKPHOS", "BILITOT", "PROT", "ALBUMIN" in the last 168 hours. No results for input(s): "LIPASE", "AMYLASE" in the last 168 hours. No results for input(s): "AMMONIA" in the last 168 hours. Coagulation profile No results for input(s): "INR", "PROTIME" in the last 168 hours. COVID-19 Labs  No results for input(s): "DDIMER", "FERRITIN", "LDH", "CRP" in the last 72 hours.  Lab Results  Component Value Date   SARSCOV2NAA NEGATIVE 09/03/2022   [place] NEGATIVE 06/01/2021    CBC: Recent Labs  Lab 09/03/22 2012 09/04/22 0207 09/05/22 0921  WBC 13.3* 13.5* 7.0  NEUTROABS 10.2*  --  4.5  HGB  15.7 14.9 12.3*  HCT 51.8 51.4 41.4  MCV 95.2 100.0 95.8  PLT 143* 113* 96*    Cardiac Enzymes: No results for input(s): "CKTOTAL", "CKMB", "CKMBINDEX", "TROPONINI" in the last 168 hours. BNP (last 3 results) No results for input(s): "PROBNP" in the last 8760 hours. CBG: No results for input(s): "GLUCAP" in the last 168 hours. D-Dimer: No results for input(s): "DDIMER" in the last 72 hours. Hgb A1c: No results for input(s): "HGBA1C" in the last 72 hours. Lipid Profile: No results for input(s): "CHOL", "HDL", "LDLCALC", "TRIG", "CHOLHDL", "LDLDIRECT" in the last 72 hours. Thyroid function studies: Recent Labs    09/03/22 2010  TSH 0.872    Anemia work up: No results for input(s): "VITAMINB12", "FOLATE", "FERRITIN", "TIBC", "IRON", "RETICCTPCT" in the last 72 hours. Sepsis Labs: Recent Labs  Lab 09/03/22 2012 09/04/22 0207 09/05/22 0921  WBC 13.3* 13.5* 7.0    Microbiology Recent Results (from the past 240 hour(s))  Resp panel by RT-PCR (RSV, Flu A&B, Covid) Anterior Nasal Swab     Status: None   Collection Time: 09/03/22  8:40 PM   Specimen: Anterior Nasal Swab  Result Value Ref Range Status   SARS Coronavirus 2 by RT PCR NEGATIVE NEGATIVE Final    Comment: (NOTE) SARS-CoV-2 target nucleic acids are NOT DETECTED.  The SARS-CoV-2 RNA is generally detectable in upper respiratory specimens during the acute phase of infection. The lowest concentration of SARS-CoV-2 viral copies this assay can detect is 138 copies/mL. A negative result does not preclude SARS-Cov-2 infection and should not be used as the sole basis for treatment or other patient management decisions. A negative result may occur with  improper specimen collection/handling, submission of specimen other than nasopharyngeal swab, presence of viral mutation(s) within the areas targeted by this assay, and inadequate number of viral copies(<138 copies/mL). A negative result must be combined with clinical  observations, patient history, and epidemiological  information. The expected result is Negative.  Fact Sheet for Patients:  EntrepreneurPulse.com.au  Fact Sheet for Healthcare Providers:  IncredibleEmployment.be  This test is no t yet approved or cleared by the [place] FDA and  has been authorized for detection and/or diagnosis of SARS-CoV-2 by FDA under an Emergency Use Authorization (EUA). This EUA will remain  in effect (meaning this test can be used) for the duration of the COVID-19 declaration under Section 564(b)(1) of the Act, 21 U.S.C.section 360bbb-3(b)(1), unless the authorization is terminated  or revoked sooner.       Influenza A by PCR NEGATIVE NEGATIVE Final   Influenza B by PCR NEGATIVE NEGATIVE Final    Comment: (NOTE) The Xpert Xpress SARS-CoV-2/FLU/RSV plus assay is intended as an aid in the diagnosis of influenza from Nasopharyngeal swab specimens and should not be used as a sole basis for treatment. Nasal washings and aspirates are unacceptable for Xpert Xpress SARS-CoV-2/FLU/RSV testing.  Fact Sheet for Patients: EntrepreneurPulse.com.au  Fact Sheet for Healthcare Providers: IncredibleEmployment.be  This test is not yet approved or cleared by the [place] FDA and has been authorized for detection and/or diagnosis of SARS-CoV-2 by FDA under an Emergency Use Authorization (EUA). This EUA will remain in effect (meaning this test can be used) for the duration of the COVID-19 declaration under Section 564(b)(1) of the Act, 21 U.S.C. section 360bbb-3(b)(1), unless the authorization is terminated or revoked.     Resp Syncytial Virus by PCR NEGATIVE NEGATIVE Final    Comment: (NOTE) Fact Sheet for Patients: EntrepreneurPulse.com.au  Fact Sheet for Healthcare Providers: IncredibleEmployment.be  This test is not yet approved or cleared by  the [place] FDA and has been authorized for detection and/or diagnosis of SARS-CoV-2 by FDA under an Emergency Use Authorization (EUA). This EUA will remain in effect (meaning this test can be used) for the duration of the COVID-19 declaration under Section 564(b)(1) of the Act, 21 U.S.C. section 360bbb-3(b)(1), unless the authorization is terminated or revoked.  Performed at [facility], [place] [address], [name], [place] [address]   MRSA Next Gen by PCR, Nasal     Status: None   Collection Time: 09/04/22  4:50 PM   Specimen: Nasal Mucosa; Nasal Swab  Result Value Ref Range Status   MRSA by PCR Next Gen NOT DETECTED NOT DETECTED Final    Comment: (NOTE) The GeneXpert MRSA Assay (FDA approved for NASAL specimens only), is one component of a comprehensive MRSA colonization surveillance program. It is not intended to diagnose MRSA infection nor to guide or monitor treatment for MRSA infections. Test performance is not FDA approved in patients less than 49 years old. Performed at [facility], [place] [address] [place], [place] [address]      Medications:    melatonin  5 mg Oral QHS   metoprolol tartrate  12.5 mg Per Tube BID   mouth rinse  15 mL Mouth Rinse 4 times per day   tamsulosin  0.4 mg Oral QHS   Continuous Infusions:  dextrose 75 mL/hr at 09/06/22 0319   valproate sodium 125 mg (09/06/22 0459)      LOS: 3 days   Charlynne Cousins  [facility]  09/06/2022, 11:24 AM

## 2022-09-06 NOTE — Plan of Care (Signed)
  Problem: Pain Managment: Goal: General experience of comfort will improve Outcome: Progressing   

## 2022-09-06 NOTE — Progress Notes (Signed)
SLP Cancellation Note  Patient Details Name: Adam Swanson. MRN: [id number] DOB: 09-10-1928   Cancelled treatment:       Reason Eval/Treat Not Completed: Patient declined, no reason specified;pt kindly declined stating "I'm resting, come back later"; ST will continue efforts; pt on regular/thin liquid diet per chart review (as tolerated).   Pat Leah Thornberry,M.S., CCC-SLP 09/06/2022, 11:10 AM

## 2022-09-06 NOTE — TOC Initial Note (Signed)
Transition of Care (TOC) - Initial/Assessment Note   Patient Details  Name: Adam Swanson. MRN: [id number] Date of Birth: 17-Nov-1927  Transition of [facility]) CM/SW Contact:    Sherie Don, LCSW Phone Number: 09/06/2022, 11:47 AM  Clinical Narrative: Patient is a LTC resident at [facility]. CSW confirmed with patient's son, Adam Swanson, the plan is for the patient to return when medically ready. FL2 started. CSW attempted to follow up with [place] in admissions at [facility]. TOC to follow.  Expected Discharge Plan: [place] Barriers to Discharge: Continued Medical Work up  Patient Goals and CMS Choice Patient states their goals for this hospitalization and ongoing recovery are:: Return to [facility] SNF CMS Medicare.gov Compare Post Acute Care list provided to:: Patient Represenative (must comment) Choice offered to / list presented to : Adult Children  Expected Discharge Plan and Services In-house Referral: Clinical Social Work Post Acute Care Choice: [place] Living arrangements for the past 2 months: [place]           DME Arranged: N/A DME Agency: NA  Prior Living Arrangements/Services Living arrangements for the past 2 months: [place] Lives with:: Facility Resident Patient language and need for interpreter reviewed:: Yes Do you feel safe going back to the place where you live?: Yes      Need for Family Participation in Patient Care: Yes (Comment) (Patient has dementia.) Care giver support system in place?: Yes (comment) Criminal Activity/Legal Involvement Pertinent to Current Situation/Hospitalization: No - Comment as needed  Activities of Daily Living Home Assistive Devices/Equipment: Wheelchair ADL Screening (condition at time of admission) Patient's cognitive ability adequate to safely complete daily activities?: No Is the patient deaf or have difficulty hearing?: Yes Does the patient have  difficulty seeing, even when wearing glasses/contacts?: Yes Does the patient have difficulty concentrating, remembering, or making decisions?: Yes Patient able to express need for assistance with ADLs?: Yes Does the patient have difficulty dressing or bathing?: Yes Independently performs ADLs?: No Communication: Independent Dressing (OT): Dependent Is this a change from baseline?: Pre-admission baseline Grooming: Dependent Is this a change from baseline?: Pre-admission baseline Feeding: Needs assistance Is this a change from baseline?:  (recent change prior to admission) Bathing: Dependent Is this a change from baseline?: Pre-admission baseline Toileting: Dependent Is this a change from baseline?: Pre-admission baseline In/Out Bed: Dependent Is this a change from baseline?: Pre-admission baseline Walks in Home: Dependent Is this a change from baseline?: Pre-admission baseline Does the patient have difficulty walking or climbing stairs?: Yes Weakness of Legs: Both Weakness of Arms/Hands: Both  Permission Sought/Granted Permission sought to share information with : Facility Art therapist granted to share information with : Yes, Verbal Permission Granted Permission granted to share info w AGENCY: [facility] SN[facility]  Emotional Assessment Attitude/Demeanor/Rapport: Unable to Assess Affect (typically observed): Unable to Assess Orientation: : Oriented to Self Alcohol / Substance Use: Not Applicable  Admission diagnosis:  Hypernatremia [E87.0] AKI (acute kidney injury) ([place]) [N17.9] Atrial fibrillation with RVR ([place]) [I48.91] Altered mental status, unspecified altered mental status type [R41.82] Patient Active Problem List   Diagnosis Date Noted   Hypernatremia 09/03/2022   Acute kidney injury superimposed on chronic kidney disease ([place]) 09/03/2022   Elevated troponin 09/03/2022   History of seizure 02/33/4356   Acute metabolic encephalopathy 86/16/8372   ICH  (intracerebral hemorrhage) ([place]) 07/09/2022   Intracerebral bleed ([place]) 07/08/2022   Seizure disorder ([place]) 05/15/2022   Severe dementia ([place]) 02/19/2022   Seizure-like  activity ([place]) 02/19/2022   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   Recurrent pneumothorax after chest tube removed 01/08/2022   Pneumothorax, traumatic    Stroke ([place]) 12/30/2021   Goals of care, counseling/discussion    Delirium 12/28/2021   Anemia in chronic kidney disease (CKD) 12/27/2021   Dementia without behavioral disturbance ([place]) 12/27/2021   Hypokalemia 12/27/2021   A-fib ([place]) 12/26/2021   Traumatic fracture of ribs with pneumothorax, left, sequela 12/26/2021   Seizure ([place]) 12/26/2021   Anxiety    Chronic kidney disease, stage 3a ([place])    Gait abnormality 01/30/2017   Weakness 01/28/2017   Low back pain without sciatica 01/28/2017   Bronchopneumonia 09/24/2015   Hyponatremia 09/24/2015   Abnormal ECG 09/24/2015   HCAP (healthcare-associated pneumonia) 09/24/2015   Heart murmur 09/24/2015   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 [place] [address] Phone: [phone] Fax: [phone]  Social Determinants of Health ([place]) Social History: SDOH Screenings   Food Insecurity: No Food Insecurity (09/04/2022)  Housing: Low Risk  (09/04/2022)  Transportation Needs: No Transportation Needs (09/04/2022)  Utilities: Not At Risk (09/04/2022)  Tobacco Use: Low Risk  (09/03/2022)   SDOH Interventions: N/A  Readmission Risk  Interventions     No data to display

## 2022-09-06 NOTE — Plan of Care (Signed)
Plan of care reviewed. 

## 2022-09-06 NOTE — Progress Notes (Signed)
Daily Progress Note   Patient Name: Adam Swanson.       Date: 09/06/2022 DOB: April 25, 1928  Age: 88 y.o. MRN#: [id number] Attending Physician: Charlynne Cousins, MD Primary Care Physician: Garwin Brothers, MD Admit Date: 09/03/2022  Reason for Consultation/Follow-up: Establishing goals of care  Subjective:  Resting in bed, somewhat confused but follows commands.   Length of Stay: 3  Current Medications: Scheduled Meds:   melatonin  5 mg Oral QHS   metoprolol tartrate  12.5 mg Oral BID   mouth rinse  15 mL Mouth Rinse 4 times per day   tamsulosin  0.4 mg Oral QHS    Continuous Infusions:  dextrose 75 mL/hr at 09/06/22 0319   valproate sodium 125 mg (09/06/22 0459)    PRN Meds: haloperidol lactate, LORazepam, mouth rinse, QUEtiapine  Physical Exam         Weak appearing gentleman Has some contractures No edema Has generalized sarcopenia Abdomen not distended  Vital Signs: BP 111/84 (BP Location: Right Arm)   Pulse 61   Temp 98.3 F (36.8 C)   Resp 18   Ht '5\' 7"'$  (1.702 m)   Wt 56 kg   SpO2 98%   BMI 19.34 kg/m  SpO2: SpO2: 98 % O2 Device: O2 Device: Nasal Cannula O2 Flow Rate: O2 Flow Rate (L/min): 2 L/min  Intake/output summary:  Intake/Output Summary (Last 24 hours) at 09/06/2022 1316 Last data filed at 09/06/2022 1000 Gross per 24 hour  Intake 1699.07 ml  Output 375 ml  Net 1324.07 ml   LBM: Last BM Date :  (Unknown) Baseline Weight: Weight: 56 kg Most recent weight: Weight: 56 kg       Palliative Assessment/Data:      Patient Active Problem List   Diagnosis Date Noted   Hypernatremia 09/03/2022   Acute kidney injury superimposed on chronic kidney disease ([place]) 09/03/2022   Elevated troponin 09/03/2022   History of seizure 03/49/1791    Acute metabolic encephalopathy 50/56/9794   ICH (intracerebral hemorrhage) ([place]) 07/09/2022   Intracerebral bleed ([place]) 07/08/2022   Seizure disorder ([place]) 05/15/2022   Severe dementia ([place]) 02/19/2022   Seizure-like activity ([place]) 02/19/2022   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   Recurrent pneumothorax after chest tube removed 01/08/2022   Pneumothorax, traumatic    Stroke ([place]) 12/30/2021  Goals of care, counseling/discussion    Delirium 12/28/2021   Anemia in chronic kidney disease (CKD) 12/27/2021   Dementia without behavioral disturbance ([place]) 12/27/2021   Hypokalemia 12/27/2021   A-fib ([place]) 12/26/2021   Traumatic fracture of ribs with pneumothorax, left, sequela 12/26/2021   Seizure ([place]) 12/26/2021   Anxiety    Chronic kidney disease, stage 3a ([place])    Gait abnormality 01/30/2017   Weakness 01/28/2017   Low back pain without sciatica 01/28/2017   Bronchopneumonia 09/24/2015   Hyponatremia 09/24/2015   Abnormal ECG 09/24/2015   HCAP (healthcare-associated pneumonia) 09/24/2015   Heart murmur 09/24/2015   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   Benign neoplasm of colon 04/09/2011   Unspecified constipation 04/09/2011   Constipation 01/18/2011   Change in bowel function 01/18/2011   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   General symptom  01/18/2011   Oropharyngeal dysphagia 01/18/2011   DIVERTICULOSIS, COLON 05/14/2007   HIATAL HERNIA 03/19/2003    Palliative Care Assessment & Plan   Patient Profile:    Assessment: 88 year old with dementia history of intracranial hemorrhage, seizures atrial fibrillation not on anticoagulation due to falls and history of brain bleed, stage III chronic kidney disease admitted with hypernatremia sodium 167 and acute kidney injury.  Recommendations/Plan: Patient remains on D5W infusion and serial sodium is being monitored PMT has seen the patient in previous hospitalization as well.  Extensive goals  of care discussions were held with the patient's 2 sons at that time as well as on 09-05-2022 at the time of initial consult.  MOST form provided.  Patient is a long-term care resident at [facility].  Recommend palliative support at [facility].  No specific inpatient palliative recommendations at this time.  Goals of Care and Additional Recommendations: Limitations on Scope of Treatment: Full Scope Treatment  Code Status:    Code Status Orders  (From admission, onward)           Start     Ordered   09/03/22 2332  Full code  Continuous       Question:  By:  Answer:  Consent: discussion documented in EHR   09/03/22 2332           Code Status History     Date Active Date Inactive Code Status Order ID Comments User Context   07/09/2022 0448 07/12/2022 2252 Full Code [id number]  Judith Part, MD Inpatient   01/09/2022 0330 01/13/2022 0217 Full Code [id number]  Etta Quill, DO ED   12/26/2021 2355 01/02/2022 1754 Full Code [id number]  Lenore Cordia, MD ED   09/24/2015 0533 09/26/2015 1842 Full Code [id number]  Toy Baker, MD ED      Advance Directive Documentation    Flowsheet Row Most Recent Value  Type of Advance Directive Healthcare Power of Attorney  Pre-existing out of facility DNR order (yellow form or pink MOST form) --  "MOST" Form in Place? --       Prognosis:  Unable to determine  Discharge Planning: Long term care resident at [facility].   Care plan was discussed with  bedside RN.   Thank you for allowing the Palliative Medicine Team to assist in the care of this patient.  Low MDM     Greater than 50%  of this time was spent counseling and coordinating care related to the above assessment and plan.  Loistine Chance, MD  Please contact Palliative Medicine Team phone at [phone] for questions and concerns.

## 2022-09-07 DIAGNOSIS — E870 Hyperosmolality and hypernatremia: Secondary | ICD-10-CM | POA: Diagnosis not present

## 2022-09-07 LAB — CBC WITH DIFFERENTIAL/PLATELET
Abs Immature Granulocytes: 0.07 10*3/uL (ref 0.00–0.07)
Basophils Absolute: 0 10*3/uL (ref 0.0–0.1)
Basophils Relative: 0 %
Eosinophils Absolute: 0 10*3/uL (ref 0.0–0.5)
Eosinophils Relative: 0 %
HCT: 38.3 % — ABNORMAL LOW (ref 39.0–52.0)
Hemoglobin: 12 g/dL — ABNORMAL LOW (ref 13.0–17.0)
Immature Granulocytes: 1 %
Lymphocytes Relative: 7 %
Lymphs Abs: 0.9 10*3/uL (ref 0.7–4.0)
MCH: 28.6 pg (ref 26.0–34.0)
MCHC: 31.3 g/dL (ref 30.0–36.0)
MCV: 91.4 fL (ref 80.0–100.0)
Monocytes Absolute: 1.1 10*3/uL — ABNORMAL HIGH (ref 0.1–1.0)
Monocytes Relative: 9 %
Neutro Abs: 9.8 10*3/uL — ABNORMAL HIGH (ref 1.7–7.7)
Neutrophils Relative %: 83 %
Platelets: 115 10*3/uL — ABNORMAL LOW (ref 150–400)
RBC: 4.19 MIL/uL — ABNORMAL LOW (ref 4.22–5.81)
RDW: 19.9 % — ABNORMAL HIGH (ref 11.5–15.5)
WBC: 11.9 10*3/uL — ABNORMAL HIGH (ref 4.0–10.5)
nRBC: 0 % (ref 0.0–0.2)

## 2022-09-07 LAB — BASIC METABOLIC PANEL
Anion gap: 5 (ref 5–15)
BUN: 17 mg/dL (ref 8–23)
CO2: 24 mmol/L (ref 22–32)
Calcium: 7.9 mg/dL — ABNORMAL LOW (ref 8.9–10.3)
Chloride: 113 mmol/L — ABNORMAL HIGH (ref 98–111)
Creatinine, Ser: 1.08 mg/dL (ref 0.61–1.24)
GFR, Estimated: >60 mL/min (ref >60)
Glucose, Bld: 82 mg/dL (ref 70–99)
Potassium: 3.4 mmol/L — ABNORMAL LOW (ref 3.5–5.1)
Sodium: 142 mmol/L (ref 135–145)

## 2022-09-07 LAB — SODIUM: Sodium: 147 mmol/L — ABNORMAL HIGH (ref 135–145)

## 2022-09-07 MED ORDER — DEXTROSE 5 % IV SOLN: INTRAVENOUS | Status: DC

## 2022-09-07 MED ORDER — DIVALPROEX SODIUM 125 MG PO CSDR
250.0000 mg | DELAYED_RELEASE_CAPSULE | Freq: Two times a day (BID) | ORAL | Status: DC
  Administered 2022-09-07 – 2022-09-08 (×2): 250 mg via ORAL
  Filled 2022-09-07 (×3): qty 2

## 2022-09-07 MED ORDER — DEXTROSE 5 % IV SOLN: INTRAVENOUS | Status: AC

## 2022-09-07 MED ORDER — POTASSIUM CHLORIDE CRYS ER 20 MEQ PO TBCR
40.0000 meq | EXTENDED_RELEASE_TABLET | Freq: Two times a day (BID) | ORAL | Status: AC
  Administered 2022-09-07 (×2): 40 meq via ORAL
  Filled 2022-09-07 (×2): qty 2

## 2022-09-07 NOTE — Progress Notes (Signed)
[facility] PROGRESS NOTE    Progress Note  Adam Swanson.  OZH:[id number] DOB: 01/12/1928 DOA: 09/03/2022 PCP: Garwin Brothers, MD     Brief Narrative:   Adam Swanson. is an 88 y.o. 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 Sodium this morning is 142 discontinue D5 recheck basic metabolic panel in the morning. Potassium is low replete orally recheck tomorrow morning. Hopefully given discharge tomorrow morning.  Chronic atrial fibrillation: not on anticoagulation due to history of intracranial hemorrhage rate controlled.  Dementia without behavioral disturbances: He is alert but completely disoriented continue Depakote.   Melatonin and Seroquel at night.  Acute metabolic encephalopathy: In the setting of dementia and hypernatremia.  History of seizures: Continue Depakote.  Elevated troponins: Denies chest pain shortness of breath likely due to hypovolemia.  Acute kidney injury on chronic kidney disease stage III A: With baseline 1.0, on admission 1.8 likely due to hypovolemia. Continue IV fluids creatinine is improving nicely.     DVT prophylaxis: lovenox Family Communication:none Status is: Inpatient Remains inpatient appropriate because: Acute metabolic encephalopathy due to hyponatremia    Code Status:     Code Status Orders  (From admission, onward)           Start      Ordered   09/03/22 2332  Full code  Continuous       Question:  By:  Answer:  Consent: discussion documented in EHR   09/03/22 2332           Code Status History     Date Active Date Inactive Code Status Order ID Comments User Context   07/09/2022 0448 07/12/2022 2252 Full Code [id number]  Judith Part, MD Inpatient   01/09/2022 0330 01/13/2022 0217 Full Code [id number]  Etta Quill, DO ED   12/26/2021 2355 01/02/2022 1754 Full Code [id number]  Lenore Cordia, MD ED   09/24/2015 0533 09/26/2015 1842 Full Code [id number]  Toy Baker, MD ED      Advance Directive Documentation    Flowsheet Row Most Recent Value  Type of Advance Directive Healthcare Power of Attorney  Pre-existing out of facility DNR order (yellow form or pink MOST form) --  "MOST" Form in Place? --         IV Access:   Peripheral IV   Procedures and diagnostic studies:   No results found.   Medical Consultants:   None.   Subjective:    Adam Swanson. pleasantly confused this morning.  Objective:    Vitals:   09/06/22 0903 09/06/22 1254 09/06/22 2121 09/06/22 2121  BP: (!) 89/56 111/84 (!) 121/99 (!) 121/99  Pulse: (!) 58 61 64 69  Resp: 15 18    Temp: 98.1 F (36.7 C) 98.3 F (36.8 C)  98.3 F (36.8 C)  TempSrc:    Oral  SpO2: 100% 98%  92%  Weight:      Height:  SpO2: 92 % O2 Flow Rate (L/min): 2 L/min   Intake/Output Summary (Last 24 hours) at 09/07/2022 1134 Last data filed at 09/07/2022 1042 Gross per 24 hour  Intake 624.74 ml  Output 500 ml  Net 124.74 ml    Filed Weights   09/05/22 2202  Weight: 56 kg    Exam: General exam: In no acute distress. Respiratory system: Good air movement and clear to auscultation. Cardiovascular system: S1 & S2 heard, RRR. No JVD. Gastrointestinal system: Abdomen is nondistended, soft and nontender.  Extremities: No pedal edema. Skin: No rashes, lesions or ulcers Psychiatry: Judgement and insight appear  normal. Mood & affect appropriate. Data Reviewed:    Labs: Basic Metabolic Panel: Recent Labs  Lab 09/04/22 1722 09/05/22 0337 09/05/22 0454 09/05/22 0921 09/05/22 1824 09/05/22 2221 09/06/22 0447 09/06/22 0842 09/06/22 1404 09/06/22 1801 09/06/22 2248 09/07/22 0241 09/07/22 0728  NA 163*   < > 159*   < > 154*   < > 152*   < > 150* 149* 146* 142 147*  K 3.7  --  4.3  --  3.5  --  3.2*  --   --   --   --  3.4*  --   CL 129*  --  125*  --  120*  --  118*  --   --   --   --  113*  --   CO2 27  --  29  --  30  --  30  --   --   --   --  24  --   GLUCOSE 107*  --  89  --  119*  --  94  --   --   --   --  82  --   BUN 37*  --  30*  --  29*  --  23  --   --   --   --  17  --   CREATININE 1.54*  --  1.41*  --  1.32*  --  1.23  --   --   --   --  1.08  --   CALCIUM 8.7*  --  8.4*  --  8.5*  --  8.2*  --   --   --   --  7.9*  --    < > = values in this interval not displayed.    GFR Estimated Creatinine Clearance: 34.6 mL/min (by C-G formula based on SCr of 1.08 mg/dL). Liver Function Tests: No results for input(s): "AST", "ALT", "ALKPHOS", "BILITOT", "PROT", "ALBUMIN" in the last 168 hours. No results for input(s): "LIPASE", "AMYLASE" in the last 168 hours. No results for input(s): "AMMONIA" in the last 168 hours. Coagulation profile No results for input(s): "INR", "PROTIME" in the last 168 hours. COVID-19 Labs  No results for input(s): "DDIMER", "FERRITIN", "LDH", "CRP" in the last 72 hours.  Lab Results  Component Value Date   SARSCOV2NAA NEGATIVE 09/03/2022   [place] NEGATIVE 06/01/2021    CBC: Recent Labs  Lab 09/03/22 2012 09/04/22 0207 09/05/22 0921  WBC 13.3* 13.5* 7.0  NEUTROABS 10.2*  --  4.5  HGB 15.7 14.9 12.3*  HCT 51.8 51.4 41.4  MCV 95.2 100.0 95.8  PLT 143* 113* 96*    Cardiac Enzymes: No results for input(s): "CKTOTAL", "CKMB", "CKMBINDEX", "TROPONINI" in the last 168 hours. BNP (last 3 results) No results for input(s): "PROBNP" in the  last 8760 hours. CBG: No results for input(s): "GLUCAP" in the last 168 hours. D-Dimer:  No results for input(s): "DDIMER" in the last 72 hours. Hgb A1c: No results for input(s): "HGBA1C" in the last 72 hours. Lipid Profile: No results for input(s): "CHOL", "HDL", "LDLCALC", "TRIG", "CHOLHDL", "LDLDIRECT" in the last 72 hours. Thyroid function studies: No results for input(s): "TSH", "T4TOTAL", "T3FREE", "THYROIDAB" in the last 72 hours.  Invalid input(s): "FREET3"  Anemia work up: No results for input(s): "VITAMINB12", "FOLATE", "FERRITIN", "TIBC", "IRON", "RETICCTPCT" in the last 72 hours. Sepsis Labs: Recent Labs  Lab 09/03/22 2012 09/04/22 0207 09/05/22 0921  WBC 13.3* 13.5* 7.0    Microbiology Recent Results (from the past 240 hour(s))  Resp panel by RT-PCR (RSV, Flu A&B, Covid) Anterior Nasal Swab     Status: None   Collection Time: 09/03/22  8:40 PM   Specimen: Anterior Nasal Swab  Result Value Ref Range Status   SARS Coronavirus 2 by RT PCR NEGATIVE NEGATIVE Final    Comment: (NOTE) SARS-CoV-2 target nucleic acids are NOT DETECTED.  The SARS-CoV-2 RNA is generally detectable in upper respiratory specimens during the acute phase of infection. The lowest concentration of SARS-CoV-2 viral copies this assay can detect is 138 copies/mL. A negative result does not preclude SARS-Cov-2 infection and should not be used as the sole basis for treatment or other patient management decisions. A negative result may occur with  improper specimen collection/handling, submission of specimen other than nasopharyngeal swab, presence of viral mutation(s) within the areas targeted by this assay, and inadequate number of viral copies(<138 copies/mL). A negative result must be combined with clinical observations, patient history, and epidemiological information. The expected result is Negative.  Fact Sheet for Patients:  EntrepreneurPulse.com.au  Fact Sheet for  Healthcare Providers:  IncredibleEmployment.be  This test is no t yet approved or cleared by the [place] FDA and  has been authorized for detection and/or diagnosis of SARS-CoV-2 by FDA under an Emergency Use Authorization (EUA). This EUA will remain  in effect (meaning this test can be used) for the duration of the COVID-19 declaration under Section 564(b)(1) of the Act, 21 U.S.C.section 360bbb-3(b)(1), unless the authorization is terminated  or revoked sooner.       Influenza A by PCR NEGATIVE NEGATIVE Final   Influenza B by PCR NEGATIVE NEGATIVE Final    Comment: (NOTE) The Xpert Xpress SARS-CoV-2/FLU/RSV plus assay is intended as an aid in the diagnosis of influenza from Nasopharyngeal swab specimens and should not be used as a sole basis for treatment. Nasal washings and aspirates are unacceptable for Xpert Xpress SARS-CoV-2/FLU/RSV testing.  Fact Sheet for Patients: EntrepreneurPulse.com.au  Fact Sheet for Healthcare Providers: IncredibleEmployment.be  This test is not yet approved or cleared by the [place] FDA and has been authorized for detection and/or diagnosis of SARS-CoV-2 by FDA under an Emergency Use Authorization (EUA). This EUA will remain in effect (meaning this test can be used) for the duration of the COVID-19 declaration under Section 564(b)(1) of the Act, 21 U.S.C. section 360bbb-3(b)(1), unless the authorization is terminated or revoked.     Resp Syncytial Virus by PCR NEGATIVE NEGATIVE Final    Comment: (NOTE) Fact Sheet for Patients: EntrepreneurPulse.com.au  Fact Sheet for Healthcare Providers: IncredibleEmployment.be  This test is not yet approved or cleared by the [place] FDA and has been authorized for detection and/or diagnosis of SARS-CoV-2 by FDA under an Emergency Use Authorization (EUA). This EUA will remain in effect (meaning this  test can be used) for the duration of the COVID-19 declaration under Section 564(b)(1) of the  Act, 21 U.S.C. section 360bbb-3(b)(1), unless the authorization is terminated or revoked.  Performed at [facility], [place] [address], [place], [place] [address]   MRSA Next Gen by PCR, Nasal     Status: None   Collection Time: 09/04/22  4:50 PM   Specimen: Nasal Mucosa; Nasal Swab  Result Value Ref Range Status   MRSA by PCR Next Gen NOT DETECTED NOT DETECTED Final    Comment: (NOTE) The GeneXpert MRSA Assay (FDA approved for NASAL specimens only), is one component of a comprehensive MRSA colonization surveillance program. It is not intended to diagnose MRSA infection nor to guide or monitor treatment for MRSA infections. Test performance is not FDA approved in patients less than 90 years old. Performed at [facility], [place] [address] [place], [place] [address]      Medications:    melatonin  5 mg Oral QHS   metoprolol tartrate  12.5 mg Oral BID   mouth rinse  15 mL Mouth Rinse 4 times per day   potassium chloride  40 mEq Oral BID   tamsulosin  0.4 mg Oral QHS   Continuous Infusions:  valproate sodium 125 mg (09/07/22 1016)      LOS: 4 days   Charlynne Cousins  [facility]  09/07/2022, 11:34 AM

## 2022-09-07 NOTE — Care Management Important Message (Signed)
Important Message  Patient Details IM Letter given. Name: Adam Swanson. MRN: [id number] Date of Birth: 02/21/1928   Medicare Important Message Given:  Yes     Kerin Salen 09/07/2022, 2:09 PM

## 2022-09-07 NOTE — Evaluation (Signed)
Physical Therapy Evaluation Patient Details Name: Adam Swanson. MRN: [id number] DOB: [birth year] Today's Date: 09/07/2022  History of Present Illness  Pt admitted from [facility] SNF with AMS and dx as hypovolemic and hypernatremic.  Pt with hx of OCD, dementia, CKD, SZ, intracranial hemorrhage, and SIADH  Clinical Impression  Pt admitted as above and presenting with functional mobility limitations 2* generalized weakness, balance deficits, and dementia related cognitive deficits.  This date pt requiring significant assist of two for performance of basic mobility tasks and would benefit from follow up rehab at SNF level to maximize IND and safety and regain PLOF.     Recommendations for follow up therapy are one component of a multi-disciplinary discharge planning process, led by the attending physician.  Recommendations may be updated based on patient status, additional functional criteria and insurance authorization.  Follow Up Recommendations Skilled nursing-short term rehab (<3 hours/day) Can patient physically be transported by private vehicle: No    Assistance Recommended at Discharge Frequent or constant Supervision/Assistance  Patient can return home with the following  Two people to help with walking and/or transfers;A lot of help with bathing/dressing/bathroom;Assistance with cooking/housework;Assist for transportation;Help with stairs or ramp for entrance    Equipment Recommendations None recommended by PT  Recommendations for Other Services       Functional Status Assessment Patient has had a recent decline in their functional status and demonstrates the ability to make significant improvements in function in a reasonable and predictable amount of time.     Precautions / Restrictions Precautions Precautions: Fall Restrictions Weight Bearing Restrictions: No      Mobility  Bed Mobility Overal bed mobility: Needs Assistance Bed Mobility: Supine to Sit, Sit to  Supine     Supine to sit: Mod assist, +2 for physical assistance, +2 for safety/equipment Sit to supine: Mod assist, +2 for physical assistance, +2 for safety/equipment   General bed mobility comments: Modified roll supine<>sit with mod assist to manage LEs and control trunk    Transfers Overall transfer level: Needs assistance Equipment used: Rolling walker (2 wheels) Transfers: Sit to/from Stand Sit to Stand: Mod assist, +2 physical assistance, +2 safety/equipment, From elevated surface           General transfer comment: Cues for use of UEs to self assist.  Physical assist to bring wt up and fwd and to balance in standing with RW    Ambulation/Gait Ambulation/Gait assistance: Mod assist, +2 physical assistance, +2 safety/equipment Gait Distance (Feet): 2 Feet Assistive device: Rolling walker (2 wheels) Gait Pattern/deviations: Step-to pattern, Decreased step length - right, Decreased step length - left, Shuffle, Trunk flexed Gait velocity: decr     General Gait Details: Pt able to side shuffle along side of bed with RW and significant assist.  Stairs            Wheelchair Mobility    Modified Rankin (Stroke Patients Only)       Balance Overall balance assessment: Needs assistance Sitting-balance support: Bilateral upper extremity supported, Feet supported Sitting balance-Leahy Scale: Poor Sitting balance - Comments: forward lean   Standing balance support: Bilateral upper extremity supported Standing balance-Leahy Scale: Poor                               Pertinent Vitals/Pain Pain Assessment Pain Assessment: Faces Faces Pain Scale: No hurt    Home Living Family/patient expects to be discharged to:: Skilled nursing facility  Additional Comments: from [facility]    Prior Function Prior Level of Function : Patient poor historian/Family not available             Mobility Comments: walks with  Rollator (per prior admit)       Hand Dominance        Extremity/Trunk Assessment   Upper Extremity Assessment Upper Extremity Assessment: Generalized weakness    Lower Extremity Assessment Lower Extremity Assessment: Generalized weakness    Cervical / Trunk Assessment Cervical / Trunk Assessment: Kyphotic  Communication   Communication: Other (comment) (Pt mumbles with min intelligle speech)  Cognition Arousal/Alertness: Lethargic Behavior During Therapy: Flat affect Overall Cognitive Status: History of cognitive impairments - at baseline                                 General Comments: Pt rousable and following simple mobility cues 75%        General Comments      Exercises     Assessment/Plan    PT Assessment Patient needs continued PT services  PT Problem List Decreased strength;Decreased activity tolerance;Decreased balance;Decreased mobility;Decreased cognition;Decreased knowledge of use of DME       PT Treatment Interventions DME instruction;Gait training;Stair training;Functional mobility training;Therapeutic activities;Therapeutic exercise;Patient/family education    PT Goals (Current goals can be found in the Care Plan section)  Acute Rehab PT Goals Patient Stated Goal: No goals stated PT Goal Formulation: Patient unable to participate in goal setting Time For Goal Achievement: 09/20/22 Potential to Achieve Goals: Fair    Frequency Min 2X/week     Co-evaluation               AM-PAC PT "6 Clicks" Mobility  Outcome Measure Help needed turning from your back to your side while in a flat bed without using bedrails?: A Lot Help needed moving from lying on your back to sitting on the side of a flat bed without using bedrails?: A Lot Help needed moving to and from a bed to a chair (including a wheelchair)?: A Lot Help needed standing up from a chair using your arms (e.g., wheelchair or bedside chair)?: A Lot Help needed to walk  in hospital room?: Total Help needed climbing 3-5 steps with a railing? : Total 6 Click Score: 10    End of Session Equipment Utilized During Treatment: Gait belt Activity Tolerance: Patient limited by fatigue Patient left: in bed;with call bell/phone within reach;with bed alarm set Nurse Communication: Mobility status PT Visit Diagnosis: Difficulty in walking, not elsewhere classified (R26.2);Muscle weakness (generalized) (M62.81)    Time: 3149-7026 PT Time Calculation (min) (ACUTE ONLY): 19 min   Charges:   PT Evaluation $PT Eval Low Complexity: 1 Low          [place] Pager [phone] Office [phone]   Juandavid Dallman 09/07/2022, 1:14 PM

## 2022-09-07 NOTE — Evaluation (Signed)
SLP Cancellation Note  Patient Details Name: Adam Swanson. MRN: [id number] DOB: [birth year]   Cancelled treatment:       Reason Eval/Treat Not Completed: Other (comment);Fatigue/lethargy limiting ability to participate (pt lethargic upon entrance to room, open mouth breathing and xerostomic, venipuncture staff in room - SLP assisted to hold pt's arm for blood draw - after which pt did not awaken adequately for po/eval despite wet washcloth to face and verbal stimulation)  Kathleen Lime, MS [place] Office [phone] Pager [phone]  Macario Golds 09/07/2022, 10:10 AM

## 2022-09-07 NOTE — Evaluation (Signed)
Clinical/Bedside Swallow Evaluation Patient Details  Name: Adam Swanson. MRN: [id number] Date of Birth: 04-27-1928  Today's Date: 09/07/2022 Time: SLP Start Time (ACUTE ONLY): 1720 SLP Stop Time (ACUTE ONLY): 1800 SLP Time Calculation (min) (ACUTE ONLY): 40 min  Past Medical History:  Past Medical History:  Diagnosis Date   Anxiety    Colon polyp    hyperplastic   Diverticulosis of colon (without mention of hemorrhage)    Esophageal stricture    GERD (gastroesophageal reflux disease)    Hiatal hernia    Lower extremity weakness    OCD (obsessive compulsive disorder)    Viral hepatitis 08/1983   Past Surgical History:  Past Surgical History:  Procedure Laterality Date   COLONOSCOPY     HEMORRHOID SURGERY     1965   HPI:  88 yo male who is full code - admit to [facility] with AMS -decreased oral intake found to be hyponatremic sodium 167 and acute kidney injury.  Pt has h/o dementia,pna, rib fractures, pneumothorax, seizure disorder, Atrial fibrillation not on anticoagulation, CKD3a, BPH, SIADH, Adm 12/2021 with agitation, left pneumothorax s/p chest tube, pna, mbs 12/2021 showed severe retention and aspiration    Feeding tube unable to be placed by flouro during this admission due to pt's large HH.  He has h/o esoph stricture, and dysphagia dating back to 2006---- esophagram in 2006 showed "vallecular retention, osteophytes C6-7 and to a lesser degree, at C4-5 with some extrinsic compression on the esophagus. May account for choking symptoms." per xray report.  Pt has had recurrent admits this year and son *during prior discussion* advised that dad is losing weight.  Swallow eval ordered- Pt has not been able to be seen over the last 3 days, thus SLP was more aggressive with his eval today.  Today he has been hypotensive, has not had po intake except part of a banana *which Hannah, NT, reports he coughed while trying to swallow.    Assessment / Plan / Recommendation  Clinical Impression   SLP arrived to pt's room with him in horizontal position in bed, rather than vertical. RN assisted to help reposition him - he immediately slid down after wet sat him fully upright.  Pt did follow some directions to allow oral care including opening mouth, protruding tongue, coughing and expectorating.  Copius viscous dried secretions line his palate - thus SLP provided aggressive oral care using toothette, tissues and water.  Copius amounts of secretions - some appearing with blood were removed *pt did not appear to have source of bleeding in mouth.  PO trials included single ice chips, tsps water, cup bolus water, icecream mixed with Ensure. Pt with prolonged oral holding, with eventual swallow initiation.  Wet congested cough with any bolus larger than a tsp of liquid noted and per his h/o obstructive dysphagia from cervical osteophytes and prior hospital ilness related deconditioning/AMS - this finding is consistent with prior MBS 12/2021. Options include to change him to clears via tsp ONLY sitting fully upright or NPO except single ice chips and SLP follow up.  Today he has been hypotensive per review of vitals and RN reports has not had hardly any po intake since sent to floor from ICU.  NT, Jarrett Soho, reports she fed him part of a banana, which caused him to cough.  SLP is concerned for pt's swallow prognosis to return to functional level given his dementia, progressive decline and baseline dysphagia.  No family present to educate, but SLP had spoken to  pt's local son on Wednesday extensively regarding concerns.  Given feeding tubes do not improve outcomes or pt's QOL in pt's with dementia - po for comfort with aspiration risk accepted may be realistic plan unless pt improves significantly relatively quickly.  Will follow up, messaged MD and spoke to NT, RN and posted swallow signs.  Would recommend pt be allowed single small ice chips, sitting upright after oral care for oral hygiene and comfort. SLP Visit  Diagnosis: Dysphagia, oropharyngeal phase (R13.12)    Aspiration Risk  Risk for inadequate nutrition/hydration;Severe aspiration risk    Diet Recommendation Ice chips PRN after oral care (clears via tsp vs single ice chips)   Medication Administration: Other (Comment) Compensations: Slow rate;Small sips/bites Postural Changes: Seated upright at 90 degrees;Remain upright for at least 30 minutes after po intake    Other  Recommendations Oral Care Recommendations: Oral care QID    Recommendations for follow up therapy are one component of a multi-disciplinary discharge planning process, led by the attending physician.  Recommendations may be updated based on patient status, additional functional criteria and insurance authorization.  Follow up Recommendations Skilled nursing-short term rehab (<3 hours/day)      Assistance Recommended at Discharge    Functional Status Assessment Patient has had a recent decline in their functional status and/or demonstrates limited ability to make significant improvements in function in a reasonable and predictable amount of time  Frequency and Duration min 1 x/week  1 week       Prognosis Prognosis for Safe Diet Advancement: Guarded Barriers to Reach Goals: Time post onset;Severity of deficits;Cognitive deficits      Swallow Study   General Date of Onset: 09/07/22 HPI: 88 yo male who is full code - admit to [facility] with AMS -decreased oral intake found to be hyponatremic sodium 167 and acute kidney injury.  Pt has h/o dementia,pna, rib fractures, pneumothorax, seizure disorder, Atrial fibrillation not on anticoagulation, CKD3a, BPH, SIADH, Adm 12/2021 with agitation, left pneumothorax s/p chest tube, pna, mbs 12/2021 showed severe retention and aspiration    Feeding tube unable to be placed by flouro during this admission due to pt's large HH.  He has h/o esoph stricture, and dysphagia dating back to 2006---- esophagram in 2006 showed "vallecular retention,  osteophytes C6-7 and to a lesser degree, at C4-5 with some extrinsic compression on the esophagus. May account for choking symptoms." per xray report.  Pt has had recurrent admits this year and son *during prior discussion* advised that dad is losing weight.  Swallow eval ordered- Pt has not been able to be seen over the last 3 days, thus SLP was more aggressive with his eval today.  Today he has been hypotensive, has not had po intake except part of a banana *which Hannah, NT, reports he coughed while trying to swallow. Type of Study: Bedside Swallow Evaluation Previous Swallow Assessment: see hpi Diet Prior to this Study: Regular;Thin liquids Temperature Spikes Noted: No Respiratory Status: Nasal cannula History of Recent Intubation: No Behavior/Cognition: Lethargic/Drowsy;Requires cueing;Other (Comment) (follows some directions) Oral Care Completed by SLP: Yes (aggressive oral care using toothette and pt 's instructions to hock and expectorate - clearing blood tinged viscous secretions from mouth) Oral Cavity - Dentition: Adequate natural dentition Vision: Impaired for self-feeding Self-Feeding Abilities: Total assist Patient Positioning: Other (comment);Postural control interferes with function (slid pt up in bed, after which he slid immediately back down) Baseline Vocal Quality: Low vocal intensity;Wet Volitional Cough: Congested Volitional Swallow: Unable to elicit  Oral/Motor/Sensory Function Overall Oral Motor/Sensory Function: Generalized oral weakness (pt did not form seal on straw nor spoon, but able to protrude tongue and expectorate secretions) Lingual ROM: Reduced right;Reduced left Lingual Strength: Reduced Lingual Sensation: Reduced Velum: Other (comment) (did not visualize due to lingual posturing)   Ice Chips Ice chips: Impaired Presentation: Spoon Pharyngeal Phase Impairments: Cough - Delayed   Thin Liquid Thin Liquid: Impaired Presentation: Spoon;Cup Pharyngeal  Phase  Impairments: Suspected delayed Swallow;Cough - Immediate    Nectar Thick Nectar Thick Liquid: Impaired Presentation: Spoon;Cup Oral Phase Impairments: Poor awareness of bolus;Reduced lingual movement/coordination Oral phase functional implications: Prolonged oral transit Pharyngeal Phase Impairments: Suspected delayed Swallow;Cough - Delayed;Cough - Immediate   Honey Thick Honey Thick Liquid: Not tested   Puree Puree: Impaired (ice cream) Presentation: Spoon Oral Phase Impairments: Reduced lingual movement/coordination;Poor awareness of bolus Oral Phase Functional Implications: Prolonged oral transit   Solid     Solid: Not tested      Macario Golds 09/07/2022,6:37 PM  Kathleen Lime, MS [facility] SLP Acute Rehab Services Office [phone] Pager [phone]

## 2022-09-07 NOTE — Plan of Care (Signed)
  Problem: Health Behavior/Discharge Planning: Goal: Ability to manage health-related needs will improve Outcome: Progressing   Problem: Clinical Measurements: Goal: Ability to maintain clinical measurements within normal limits will improve Outcome: Progressing   

## 2022-09-08 DIAGNOSIS — N179 Acute kidney failure, unspecified: Secondary | ICD-10-CM | POA: Diagnosis not present

## 2022-09-08 DIAGNOSIS — M6281 Muscle weakness (generalized): Secondary | ICD-10-CM | POA: Diagnosis not present

## 2022-09-08 DIAGNOSIS — Z7401 Bed confinement status: Secondary | ICD-10-CM | POA: Diagnosis not present

## 2022-09-08 DIAGNOSIS — E870 Hyperosmolality and hypernatremia: Secondary | ICD-10-CM | POA: Diagnosis not present

## 2022-09-08 DIAGNOSIS — R4182 Altered mental status, unspecified: Secondary | ICD-10-CM | POA: Diagnosis not present

## 2022-09-08 DIAGNOSIS — A419 Sepsis, unspecified organism: Secondary | ICD-10-CM | POA: Diagnosis not present

## 2022-09-08 DIAGNOSIS — R410 Disorientation, unspecified: Secondary | ICD-10-CM | POA: Diagnosis not present

## 2022-09-08 DIAGNOSIS — S270XXA Traumatic pneumothorax, initial encounter: Secondary | ICD-10-CM | POA: Diagnosis not present

## 2022-09-08 DIAGNOSIS — J180 Bronchopneumonia, unspecified organism: Secondary | ICD-10-CM | POA: Diagnosis not present

## 2022-09-08 DIAGNOSIS — D649 Anemia, unspecified: Secondary | ICD-10-CM | POA: Diagnosis not present

## 2022-09-08 DIAGNOSIS — G4700 Insomnia, unspecified: Secondary | ICD-10-CM | POA: Diagnosis not present

## 2022-09-08 DIAGNOSIS — R2681 Unsteadiness on feet: Secondary | ICD-10-CM | POA: Diagnosis not present

## 2022-09-08 DIAGNOSIS — N182 Chronic kidney disease, stage 2 (mild): Secondary | ICD-10-CM | POA: Diagnosis not present

## 2022-09-08 DIAGNOSIS — S270XXD Traumatic pneumothorax, subsequent encounter: Secondary | ICD-10-CM | POA: Diagnosis not present

## 2022-09-08 DIAGNOSIS — F02B4 Dementia in other diseases classified elsewhere, moderate, with anxiety: Secondary | ICD-10-CM | POA: Diagnosis not present

## 2022-09-08 DIAGNOSIS — R531 Weakness: Secondary | ICD-10-CM | POA: Diagnosis not present

## 2022-09-08 DIAGNOSIS — F0390 Unspecified dementia without behavioral disturbance: Secondary | ICD-10-CM | POA: Diagnosis not present

## 2022-09-08 DIAGNOSIS — R627 Adult failure to thrive: Secondary | ICD-10-CM | POA: Diagnosis not present

## 2022-09-08 DIAGNOSIS — R1312 Dysphagia, oropharyngeal phase: Secondary | ICD-10-CM | POA: Diagnosis not present

## 2022-09-08 DIAGNOSIS — Z9181 History of falling: Secondary | ICD-10-CM | POA: Diagnosis not present

## 2022-09-08 DIAGNOSIS — F0394 Unspecified dementia, unspecified severity, with anxiety: Secondary | ICD-10-CM | POA: Diagnosis not present

## 2022-09-08 DIAGNOSIS — E785 Hyperlipidemia, unspecified: Secondary | ICD-10-CM | POA: Diagnosis not present

## 2022-09-08 DIAGNOSIS — R41841 Cognitive communication deficit: Secondary | ICD-10-CM | POA: Diagnosis not present

## 2022-09-08 DIAGNOSIS — N1831 Chronic kidney disease, stage 3a: Secondary | ICD-10-CM | POA: Diagnosis not present

## 2022-09-08 DIAGNOSIS — I639 Cerebral infarction, unspecified: Secondary | ICD-10-CM | POA: Diagnosis not present

## 2022-09-08 DIAGNOSIS — W010XXA Fall on same level from slipping, tripping and stumbling without subsequent striking against object, initial encounter: Secondary | ICD-10-CM | POA: Diagnosis not present

## 2022-09-08 DIAGNOSIS — R569 Unspecified convulsions: Secondary | ICD-10-CM | POA: Diagnosis not present

## 2022-09-08 DIAGNOSIS — S2242XA Multiple fractures of ribs, left side, initial encounter for closed fracture: Secondary | ICD-10-CM | POA: Diagnosis not present

## 2022-09-08 DIAGNOSIS — I619 Nontraumatic intracerebral hemorrhage, unspecified: Secondary | ICD-10-CM | POA: Diagnosis not present

## 2022-09-08 DIAGNOSIS — I4891 Unspecified atrial fibrillation: Secondary | ICD-10-CM | POA: Diagnosis not present

## 2022-09-08 DIAGNOSIS — R2689 Other abnormalities of gait and mobility: Secondary | ICD-10-CM | POA: Diagnosis not present

## 2022-09-08 DIAGNOSIS — R296 Repeated falls: Secondary | ICD-10-CM | POA: Diagnosis not present

## 2022-09-08 DIAGNOSIS — M4000 Postural kyphosis, site unspecified: Secondary | ICD-10-CM | POA: Diagnosis not present

## 2022-09-08 DIAGNOSIS — G301 Alzheimer's disease with late onset: Secondary | ICD-10-CM | POA: Diagnosis not present

## 2022-09-08 DIAGNOSIS — M150 Primary generalized (osteo)arthritis: Secondary | ICD-10-CM | POA: Diagnosis not present

## 2022-09-08 DIAGNOSIS — J159 Unspecified bacterial pneumonia: Secondary | ICD-10-CM | POA: Diagnosis not present

## 2022-09-08 LAB — CBC WITH DIFFERENTIAL/PLATELET
Abs Immature Granulocytes: 0.06 10*3/uL (ref 0.00–0.07)
Basophils Absolute: 0 10*3/uL (ref 0.0–0.1)
Basophils Relative: 0 %
Eosinophils Absolute: 0.1 10*3/uL (ref 0.0–0.5)
Eosinophils Relative: 1 %
HCT: 36.1 % — ABNORMAL LOW (ref 39.0–52.0)
Hemoglobin: 11.6 g/dL — ABNORMAL LOW (ref 13.0–17.0)
Immature Granulocytes: 1 %
Lymphocytes Relative: 10 %
Lymphs Abs: 1.2 10*3/uL (ref 0.7–4.0)
MCH: 28.8 pg (ref 26.0–34.0)
MCHC: 32.1 g/dL (ref 30.0–36.0)
MCV: 89.6 fL (ref 80.0–100.0)
Monocytes Absolute: 1.2 10*3/uL — ABNORMAL HIGH (ref 0.1–1.0)
Monocytes Relative: 10 %
Neutro Abs: 8.9 10*3/uL — ABNORMAL HIGH (ref 1.7–7.7)
Neutrophils Relative %: 78 %
Platelets: 110 10*3/uL — ABNORMAL LOW (ref 150–400)
RBC: 4.03 MIL/uL — ABNORMAL LOW (ref 4.22–5.81)
RDW: 19.9 % — ABNORMAL HIGH (ref 11.5–15.5)
WBC: 11.4 10*3/uL — ABNORMAL HIGH (ref 4.0–10.5)
nRBC: 0 % (ref 0.0–0.2)

## 2022-09-08 LAB — BASIC METABOLIC PANEL
Anion gap: 4 — ABNORMAL LOW (ref 5–15)
BUN: 13 mg/dL (ref 8–23)
CO2: 25 mmol/L (ref 22–32)
Calcium: 8 mg/dL — ABNORMAL LOW (ref 8.9–10.3)
Chloride: 113 mmol/L — ABNORMAL HIGH (ref 98–111)
Creatinine, Ser: 0.98 mg/dL (ref 0.61–1.24)
GFR, Estimated: >60 mL/min (ref >60)
Glucose, Bld: 121 mg/dL — ABNORMAL HIGH (ref 70–99)
Potassium: 3.6 mmol/L (ref 3.5–5.1)
Sodium: 142 mmol/L (ref 135–145)

## 2022-09-08 MED ORDER — LORAZEPAM 0.5 MG PO TABS: 0.5000 mg | ORAL_TABLET | Freq: Three times a day (TID) | ORAL | 0 refills | Status: AC | PRN

## 2022-09-08 NOTE — Progress Notes (Signed)
Called facility and gave report to [facility], all questions answered.  Pt resting in bed, son at bedside updated. Pt to discharged to SNF via PTAR.

## 2022-09-08 NOTE — Progress Notes (Signed)
   09/08/22 8325  Assess: MEWS Score  BP (!) 82/70  MAP (mmHg) 76  Pulse Rate (!) 101  Resp 17  SpO2 93 %  Assess: MEWS Score  MEWS Temp 0  MEWS Systolic 1  MEWS Pulse 1  MEWS RR 0  MEWS LOC 0  MEWS Score 2  MEWS Score Color Yellow  Assess: if the MEWS score is Yellow or Red  Were vital signs taken at a resting state? Yes  Focused Assessment No change from prior assessment  Does the patient meet 2 or more of the SIRS criteria? No  MEWS guidelines implemented *See Row Information* Yes  Treat  MEWS Interventions Other (Comment) (notified provider)  Take Vital Signs  Increase Vital Sign Frequency  Yellow: Q 2hr X 2 then Q 4hr X 2, if remains yellow, continue Q 4hrs  Escalate  MEWS: Escalate Yellow: discuss with charge nurse/RN and consider discussing with provider and RRT  Notify: Charge Nurse/RN  Name of Charge Nurse/RN Notified JT RN  Date Charge Nurse/RN Notified 09/08/22  Time Charge Nurse/RN Notified [id number]  Provider Notification  Provider Name/Title Zebedee Iba NP  Date Provider Notified 09/08/22  Time Provider Notified [phone]  Method of Notification  (secure chat)  Notification Reason Other (Comment) (BP, yellow MEWS)  Provider response No new orders  Assess: SIRS CRITERIA  SIRS Temperature  0  SIRS Pulse 1  SIRS Respirations  0  SIRS WBC 0  SIRS Score Sum  1

## 2022-09-08 NOTE — NC FL2 (Signed)
[place] LEVEL OF CARE FORM     IDENTIFICATION  Patient Name: Adam Swanson. Birthdate: Oct 25, 1927 Sex: male Admission Date (Current Location): 09/03/2022  [facility] and [place] Number:  Herbalist and Address:  [facility],  [place], [place]      Provider Number: [id number]  Attending Physician Name and Address:  Charlynne Cousins, MD  Relative Name and Phone Number:  Cornelius Marullo (son) Ph: [phone]    Current Level of Care: Hospital Recommended Level of Care: [place] Prior Approval Number:    Date Approved/Denied:   PASRR Number:    Discharge Plan: SNF    Current Diagnoses: Patient Active Problem List   Diagnosis Date Noted   Hypernatremia 09/03/2022   Acute kidney injury superimposed on chronic kidney disease ([place]) 09/03/2022   Elevated troponin 09/03/2022   History of seizure 42/68/3419   Acute metabolic encephalopathy 62/22/9798   ICH (intracerebral hemorrhage) ([place]) 07/09/2022   Intracerebral bleed ([place]) 07/08/2022   Seizure disorder ([place]) 05/15/2022   Severe dementia ([place]) 02/19/2022   Seizure-like activity ([place]) 02/19/2022   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   Recurrent pneumothorax after chest tube removed 01/08/2022   Pneumothorax, traumatic    Stroke ([place]) 12/30/2021   Goals of care, counseling/discussion    Delirium 12/28/2021   Anemia in chronic kidney disease (CKD) 12/27/2021   Dementia without behavioral disturbance ([place]) 12/27/2021   Hypokalemia 12/27/2021   A-fib ([place]) 12/26/2021   Traumatic fracture of ribs with pneumothorax, left, sequela 12/26/2021   Seizure ([place]) 12/26/2021   Anxiety    Chronic kidney disease, stage 3a ([place])    Gait abnormality 01/30/2017   Weakness 01/28/2017   Low back pain without sciatica 01/28/2017   Bronchopneumonia 09/24/2015   Hyponatremia 09/24/2015   Abnormal ECG 09/24/2015   HCAP (healthcare-associated pneumonia) 09/24/2015   Heart  murmur 09/24/2015   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   Benign neoplasm of colon 04/09/2011   Unspecified constipation 04/09/2011   Constipation 01/18/2011   Change in bowel function 01/18/2011   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   General symptom  01/18/2011   Oropharyngeal dysphagia 01/18/2011   DIVERTICULOSIS, COLON 05/14/2007   HIATAL HERNIA 03/19/2003    Orientation RESPIRATION BLADDER Height & Weight     Self  O2 (2L/min) Incontinent Weight: 123 lb 7.3 oz (56 kg) Height:  '5\' 7"'$  (170.2 cm)  BEHAVIORAL SYMPTOMS/MOOD NEUROLOGICAL BOWEL NUTRITION STATUS    Convulsions/Seizures Incontinent Diet (Regular diet)  AMBULATORY STATUS COMMUNICATION OF NEEDS Skin   Extensive Assist Verbally Skin abrasions, Other (Comment) (Abrasions: bilateral arms; Ecchymosis: bilateral arms)                       Personal Care Assistance Level of Assistance  Bathing, Feeding, Dressing Bathing Assistance: Limited assistance Feeding assistance: Independent Dressing Assistance: Limited assistance     Functional Limitations Info  Sight, Hearing, Speech Sight Info: Impaired Hearing Info: Adequate Speech Info: Adequate    SPECIAL CARE FACTORS FREQUENCY                       Contractures Contractures Info: Not present    Additional Factors Info  Code Status, Allergies, Psychotropic Code Status Info: Full Allergies Info: Bee Venom, Nsaids Psychotropic Info: Haldol, Ativan, Seroquel         Current Medications (09/08/2022):  This is the current hospital active medication list Current Facility-Administered  Medications  Medication Dose Route Frequency Provider Last Rate Last Admin   dextrose 5 % solution   Intravenous Continuous Charlynne Cousins, MD 75 mL/hr at 09/08/22 0244 New Bag at 09/08/22 0244   divalproex (DEPAKOTE SPRINKLE) capsule 250 mg  250 mg Oral Q12H Charlynne Cousins, MD   250 mg at 09/08/22 0919   haloperidol  lactate (HALDOL) injection 1 mg  1 mg Intravenous Q6H PRN Charlynne Cousins, MD   1 mg at 09/06/22 1941   LORazepam (ATIVAN) tablet 0.5 mg  0.5 mg Oral Q8H PRN Charlynne Cousins, MD   0.5 mg at 09/06/22 1034   melatonin tablet 5 mg  5 mg Oral QHS Charlynne Cousins, MD   5 mg at 09/07/22 2313   metoprolol tartrate (LOPRESSOR) tablet 12.5 mg  12.5 mg Oral BID Charlynne Cousins, MD   12.5 mg at 09/07/22 1014   Oral care mouth rinse  15 mL Mouth Rinse 4 times per day Charlynne Cousins, MD   15 mL at 09/08/22 1437   Oral care mouth rinse  15 mL Mouth Rinse PRN Charlynne Cousins, MD       tamsulosin [facility]) capsule 0.4 mg  0.4 mg Oral QHS Charlynne Cousins, MD   0.4 mg at 09/07/22 2314     Discharge Medications: Please see discharge summary for a list of discharge medications.  Relevant Imaging Results:  Relevant Lab Results:   Additional Information SSN: [id number]  Sherie Don, LCSW

## 2022-09-08 NOTE — Progress Notes (Signed)
Daily Progress Note   Patient Name: Adam Swanson.       Date: 09/08/2022 DOB: Nov 04, 1927  Age: 88 y.o. MRN#: [id number] Attending Physician: Aileen Fass, Tammi Klippel, MD Primary Care Physician: Garwin Brothers, MD Admit Date: 09/03/2022  Reason for Consultation/Follow-up: Establishing goals of care  Subjective:  Resting in bed, no distress  Length of Stay: 5  Current Medications: Scheduled Meds:   divalproex  250 mg Oral Q12H   melatonin  5 mg Oral QHS   metoprolol tartrate  12.5 mg Oral BID   mouth rinse  15 mL Mouth Rinse 4 times per day   tamsulosin  0.4 mg Oral QHS    Continuous Infusions:  dextrose 75 mL/hr at 09/08/22 0244    PRN Meds: haloperidol lactate, LORazepam, mouth rinse  Physical Exam         Weak appearing gentleman Has some contractures No edema Has generalized sarcopenia Abdomen not distended  Vital Signs: BP 99/66   Pulse 82   Temp 98.4 F (36.9 C) (Oral)   Resp 18   Ht '5\' 7"'$  (1.702 m)   Wt 56 kg   SpO2 95%   BMI 19.34 kg/m  SpO2: SpO2: 95 % O2 Device: O2 Device: Nasal Cannula O2 Flow Rate: O2 Flow Rate (L/min): 2 L/min  Intake/output summary:  Intake/Output Summary (Last 24 hours) at 09/08/2022 1052 Last data filed at 09/08/2022 0600 Gross per 24 hour  Intake 1321.19 ml  Output 350 ml  Net 971.19 ml    LBM: Last BM Date :  (Unknown) Baseline Weight: Weight: 56 kg Most recent weight: Weight: 56 kg       Palliative Assessment/Data:      Patient Active Problem List   Diagnosis Date Noted   Hypernatremia 09/03/2022   Acute kidney injury superimposed on chronic kidney disease ([place]) 09/03/2022   Elevated troponin 09/03/2022   History of seizure 32/91/9166   Acute metabolic encephalopathy 06/00/4599   ICH (intracerebral  hemorrhage) ([place]) 07/09/2022   Intracerebral bleed ([place]) 07/08/2022   Seizure disorder ([place]) 05/15/2022   Severe dementia ([place]) 02/19/2022   Seizure-like activity ([place]) 02/19/2022   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   Recurrent pneumothorax after chest tube removed 01/08/2022   Pneumothorax, traumatic    Stroke ([place]) 12/30/2021   Goals of care, counseling/discussion  Delirium 12/28/2021   Anemia in chronic kidney disease (CKD) 12/27/2021   Dementia without behavioral disturbance ([place]) 12/27/2021   Hypokalemia 12/27/2021   A-fib ([place]) 12/26/2021   Traumatic fracture of ribs with pneumothorax, left, sequela 12/26/2021   Seizure ([place]) 12/26/2021   Anxiety    Chronic kidney disease, stage 3a ([place])    Gait abnormality 01/30/2017   Weakness 01/28/2017   Low back pain without sciatica 01/28/2017   Bronchopneumonia 09/24/2015   Hyponatremia 09/24/2015   Abnormal ECG 09/24/2015   HCAP (healthcare-associated pneumonia) 09/24/2015   Heart murmur 09/24/2015   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   Benign neoplasm of colon 04/09/2011   Unspecified constipation 04/09/2011   Constipation 01/18/2011   Change in bowel function 01/18/2011   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   General symptom  01/18/2011   Oropharyngeal dysphagia 01/18/2011   DIVERTICULOSIS, COLON 05/14/2007   HIATAL HERNIA 03/19/2003    Palliative Care Assessment & Plan   Patient Profile:    Assessment: 88 year old with dementia history of intracranial hemorrhage, seizures atrial fibrillation not on anticoagulation due to falls and history of brain bleed, stage III chronic kidney disease admitted with hypernatremia sodium 167 and acute kidney injury.  Recommendations/Plan: Patient is being d/c today.  PMT has seen the patient in previous hospitalization as well.  Extensive goals of care discussions were held with the patient's 2 sons at that time as well as on 09-05-2022 at the  time of initial consult.  MOST form provided.  Patient is a long-term care resident at [facility].  Recommend palliative support at [facility].  No specific inpatient palliative recommendations at this time. Recommend completion of MOST form at his facility, recommend palliative care follow up.   Goals of Care and Additional Recommendations: Limitations on Scope of Treatment: Full Scope Treatment  Code Status:    Code Status Orders  (From admission, onward)           Start     Ordered   09/03/22 2332  Full code  Continuous       Question:  By:  Answer:  Consent: discussion documented in EHR   09/03/22 2332           Code Status History     Date Active Date Inactive Code Status Order ID Comments User Context   07/09/2022 0448 07/12/2022 2252 Full Code [id number]  Judith Part, MD Inpatient   01/09/2022 0330 01/13/2022 0217 Full Code [id number]  Etta Quill, DO ED   12/26/2021 2355 01/02/2022 1754 Full Code [id number]  Lenore Cordia, MD ED   09/24/2015 0533 09/26/2015 1842 Full Code [id number]  Toy Baker, MD ED      Advance Directive Documentation    Flowsheet Row Most Recent Value  Type of Advance Directive Healthcare Power of Attorney  Pre-existing out of facility DNR order (yellow form or pink MOST form) --  "MOST" Form in Place? --       Prognosis:  Unable to determine  Discharge Planning: Long term care resident at [facility].   Care plan was discussed with  IDT  Thank you for allowing the Palliative Medicine Team to assist in the care of this patient.  Low MDM     Greater than 50%  of this time was spent counseling and coordinating care related to the above assessment and plan.  Loistine Chance, MD  Please contact Palliative Medicine Team phone at [phone] for questions and concerns.

## 2022-09-08 NOTE — Plan of Care (Signed)
  Problem: Education: Goal: Knowledge of General Education information will improve Description: Including pain rating scale, medication(s)/side effects and non-pharmacologic comfort measures Outcome: Progressing   Problem: Safety: Goal: Ability to remain free from injury will improve Outcome: Progressing   

## 2022-09-08 NOTE — Discharge Summary (Signed)
Physician Discharge Summary  Adam Swanson. VOH:[id number] DOB: 1928-02-05 DOA: 09/03/2022  PCP: Garwin Brothers, MD  Admit date: 09/03/2022 Discharge date: 09/08/2022  Admitted From: SNF Disposition:  SNF  Recommendations for Outpatient Follow-up:  Follow up with PCP in 1-2 weeks Please obtain BMP/CBC in one week   Home Health:no Equipment/Devices:None  Discharge Condition:Stable CODE STATUS:Full Diet recommendation: Heart Healthy   Brief/Interim Summary: 88 y.o. 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 On admission his sodium was greater 165 was started on D5 his sodium has returned down to 142. There is likely due to decreased oral intake and medication palliative care was consulted and discussed with both sons and they had unrealistic expectations. Palliative care to follow-up facility. He will go to skilled off diuretics sodium tablets and demeclocycline.  Chronic atrial fibrillation: On anticoagulation due to intracranial hemorrhage.  Dementia without behavioral disturbances: No changes made to his medication continue Depakote.  Acute metabolic  encephalopathy: Likely in the setting of hyponatremia now resolved.  History of seizures: Continue Keppra.  Acute kidney injury on chronic kidney disease stage IIIa: With baseline creatinine 1.0 on admission 1.8 likely due to hypovolemia resolved with IV fluid resuscitation.  Discharge Instructions  Discharge Instructions     Diet - low sodium heart healthy   Complete by: As directed    Increase activity slowly   Complete by: As directed       Allergies as of 09/08/2022       Reactions   Bee Venom Anaphylaxis, Other (See Comments)   Loss of consciousness, also   Nsaids Other (See Comments)   "Allergic," per [facility]        Medication List     STOP taking these medications    demeclocycline 150 MG tablet Commonly known as: DECLOMYCIN   furosemide 20 MG tablet Commonly known as: LASIX   furosemide 40 MG tablet Commonly known as: LASIX   sodium chloride 1 g tablet       TAKE these medications    acetaminophen 325 MG tablet Commonly known as: TYLENOL Take 650 mg by mouth every 6 (six) hours as needed for mild pain (MAX OF 4,000 MG/24 HOURS FROM ALL COMBINED SOURCES OF TYLENOL).   Depakote 250 MG DR tablet Generic drug: divalproex Take 250 mg by mouth in the morning and at bedtime. What changed: Another medication with the same name was removed. Continue taking this medication, and follow the directions you see here.   EPINEPHrine 0.3 mg/0.3 mL Soaj injection Commonly known as: EPI-PEN Inject 0.3 mg into the muscle as needed for anaphylaxis.   famotidine 20 MG tablet Commonly known as: PEPCID Take 20 mg by mouth every evening.   guaiFENesin 100 MG/5ML liquid Commonly  known as: ROBITUSSIN Take 200 mg by mouth every 4 (four) hours as needed for cough.   loperamide 2 MG tablet Commonly known as: IMODIUM A-D Take 2-4 mg by mouth See admin instructions. Take 4 mg by mouth after 1st loose stool, then 2 mg after each subsequent one. MAX of 16 mg/24 hours    LORazepam 1 MG tablet Commonly known as: ATIVAN Take 1 mg by mouth every 12 (twelve) hours as needed for anxiety.   LORazepam 0.5 MG tablet Commonly known as: ATIVAN Take 1 tablet (0.5 mg total) by mouth every 8 (eight) hours as needed for anxiety.   melatonin 3 MG Tabs tablet Take 3 mg by mouth at bedtime.   metoprolol tartrate 25 MG tablet Commonly known as: LOPRESSOR Take 1 tablet (25 mg total) by mouth 2 (two) times daily. What changed:  how much to take when to take this   OXYGEN Inhale 2 L/min into the lungs as needed (TO MAINTAIN SATS ABOVE 90%).   senna 8.6 MG Tabs tablet Commonly known as: SENOKOT Take 2 tablets by mouth daily as needed (for constipation).   SUNSCREENS EX Apply 1 application  topically every 2 (two) hours as needed (if exposed to the sun).   tamsulosin 0.4 MG Caps capsule Commonly known as: FLOMAX Take 0.4 mg by mouth at bedtime.        Allergies  Allergen Reactions   Bee Venom Anaphylaxis and Other (See Comments)    Loss of consciousness, also   Nsaids Other (See Comments)    "Allergic," per [facility]    Consultations: None   Procedures/Studies: DG Naso G Tube Plc W/Fl W/Rad  Result Date: 09/04/2022 CLINICAL DATA:  Poor oral intake. Hyponatremia. Field nasogastric tube placement at the bedside EXAM: NASO G TUBE PLACEMENT WITH FL AND WITH RAD CONTRAST:  Approximately 10 cc of Omnipaque 300 per tube. FLUOROSCOPY: Fluoroscopy Time:  2 minutes and 54 seconds Radiation Exposure Index (if provided by the fluoroscopic device): 13.8 mGy COMPARISON:  Abdominal radiographs same date and chest radiographs 09/03/2022. Abdominal CT 11/06/2015. FINDINGS: Lubricated nasogastric tube was inserted per the left nares and advanced into the chest. The tube repeatedly deviated towards the left at the level of the mid mediastinum, although did not appear to be within the left mainstem bronchus, and the patient was able to maintain speech. Based on prior imaging,  the tube was felt to be within a large hiatal hernia. This was confirmed by injecting a small amount of air, and subsequently a small amount of contrast into the tube. This confirms location within a hiatal hernia. Additional air was injected to distend the intrathoracic stomach and aid advancement of the tube. Despite these maneuvers, I was unable to advance the tube through the diaphragmatic hiatus. Most of the stomach appears to be within the left lower chest. The nasogastric tube was taped in place for use at this level if clinically desired. If this position does not satisfy needs, consider follow-up radiographs in approximately 12 hours to assess for spontaneous advancement prior to removal. IMPRESSION: 1. Nasogastric tube placement under fluoroscopy. Tube could not be manipulated out of a large hiatal hernia and did not pass below the diaphragm. 2. These results were discussed by telephone at the time of interpretation on 09/04/2022 at 4:00 pm with provider Florencia Reasons , who verbally acknowledged these results. Electronically Signed   By: Richardean Sale M.D.   On: 09/04/2022 16:16   DG Abd Portable 1V  Result Date: 09/04/2022 CLINICAL  DATA:  Evaluate NG tube. EXAM: PORTABLE ABDOMEN - 1 VIEW COMPARISON:  None Available. FINDINGS: The NG tube is not visualized suggesting it is in the mouth or pharynx. The cardiomediastinal silhouette is stable. No pneumothorax. No other acute interval changes. IMPRESSION: The NG tube is not visualized suggesting it is in the mouth or pharynx. Recommend repositioning. Electronically Signed   By: Dorise Bullion III M.D.   On: 09/04/2022 12:57   CT Head Wo Contrast  Result Date: 09/03/2022 CLINICAL DATA:  Delirium. EXAM: CT HEAD WITHOUT CONTRAST TECHNIQUE: Contiguous axial images were obtained from the base of the skull through the vertex without intravenous contrast. RADIATION DOSE REDUCTION: This exam was performed according to the departmental dose-optimization program  which includes automated exposure control, adjustment of the mA and/or kV according to patient size and/or use of iterative reconstruction technique. COMPARISON:  Head CT dated 07/08/2022. FINDINGS: Evaluation of this exam is very limited, almost nondiagnostic, due to severe motion artifact. Brain: Moderate age-related atrophy and chronic microvascular ischemic changes. No definite acute intracranial hemorrhage. No mass effect or midline shift. No extra-axial fluid collection. Vascular: No hyperdense vessel or unexpected calcification. Skull: Normal. Negative for fracture or focal lesion. Sinuses/Orbits: No acute finding. Other: None IMPRESSION: No obvious large intracranial hemorrhage on a very limited exam. Electronically Signed   By: Anner Crete M.D.   On: 09/03/2022 22:05   DG Chest Port 1 View  Result Date: 09/03/2022 CLINICAL DATA:  Fatigue EXAM: PORTABLE CHEST 1 VIEW COMPARISON:  07/08/2022 FINDINGS: Limited exam secondary to patient rotation. Stable mild cardiomegaly. Moderate hiatal hernia. No definite airspace consolidation. No pleural effusion or pneumothorax. Bones are demineralized. IMPRESSION: Limited exam secondary to patient rotation. No acute cardiopulmonary findings. Electronically Signed   By: Davina Poke D.O.   On: 09/03/2022 20:24   (Echo, Carotid, EGD, Colonoscopy, ERCP)    Subjective: No complaints  Discharge Exam: Vitals:   09/08/22 0802 09/08/22 0920  BP: (!) 110/92 99/66  Pulse: 70 82  Resp: 18   Temp: 98.4 F (36.9 C)   SpO2: 95%    Vitals:   09/08/22 0547 09/08/22 0627 09/08/22 0802 09/08/22 0920  BP: (!) 78/61 (!) 82/70 (!) 110/92 99/66  Pulse: (!) 53 (!) 101 70 82  Resp: '15 17 18   '$ Temp: 98 F (36.7 C)  98.4 F (36.9 C)   TempSrc:   Oral   SpO2: 95% 93% 95%   Weight:      Height:        General: Pt is alert, awake, not in acute distress Cardiovascular: RRR, S1/S2 +, no rubs, no gallops Respiratory: CTA bilaterally, no wheezing, no  rhonchi Abdominal: Soft, NT, ND, bowel sounds + Extremities: no edema, no cyanosis    The results of significant diagnostics from this hospitalization (including imaging, microbiology, ancillary and laboratory) are listed below for reference.     Microbiology: Recent Results (from the past 240 hour(s))  Resp panel by RT-PCR (RSV, Flu A&B, Covid) Anterior Nasal Swab     Status: None   Collection Time: 09/03/22  8:40 PM   Specimen: Anterior Nasal Swab  Result Value Ref Range Status   SARS Coronavirus 2 by RT PCR NEGATIVE NEGATIVE Final    Comment: (NOTE) SARS-CoV-2 target nucleic acids are NOT DETECTED.  The SARS-CoV-2 RNA is generally detectable in upper respiratory specimens during the acute phase of infection. The lowest concentration of SARS-CoV-2 viral copies this assay can detect is 138 copies/mL. A negative result does not  preclude SARS-Cov-2 infection and should not be used as the sole basis for treatment or other patient management decisions. A negative result may occur with  improper specimen collection/handling, submission of specimen other than nasopharyngeal swab, presence of viral mutation(s) within the areas targeted by this assay, and inadequate number of viral copies(<138 copies/mL). A negative result must be combined with clinical observations, patient history, and epidemiological information. The expected result is Negative.  Fact Sheet for Patients:  EntrepreneurPulse.com.au  Fact Sheet for Healthcare Providers:  IncredibleEmployment.be  This test is no t yet approved or cleared by the [place] FDA and  has been authorized for detection and/or diagnosis of SARS-CoV-2 by FDA under an Emergency Use Authorization (EUA). This EUA will remain  in effect (meaning this test can be used) for the duration of the COVID-19 declaration under Section 564(b)(1) of the Act, 21 U.S.C.section 360bbb-3(b)(1), unless the authorization  is terminated  or revoked sooner.       Influenza A by PCR NEGATIVE NEGATIVE Final   Influenza B by PCR NEGATIVE NEGATIVE Final    Comment: (NOTE) The Xpert Xpress SARS-CoV-2/FLU/RSV plus assay is intended as an aid in the diagnosis of influenza from Nasopharyngeal swab specimens and should not be used as a sole basis for treatment. Nasal washings and aspirates are unacceptable for Xpert Xpress SARS-CoV-2/FLU/RSV testing.  Fact Sheet for Patients: EntrepreneurPulse.com.au  Fact Sheet for Healthcare Providers: IncredibleEmployment.be  This test is not yet approved or cleared by the [place] FDA and has been authorized for detection and/or diagnosis of SARS-CoV-2 by FDA under an Emergency Use Authorization (EUA). This EUA will remain in effect (meaning this test can be used) for the duration of the COVID-19 declaration under Section 564(b)(1) of the Act, 21 U.S.C. section 360bbb-3(b)(1), unless the authorization is terminated or revoked.     Resp Syncytial Virus by PCR NEGATIVE NEGATIVE Final    Comment: (NOTE) Fact Sheet for Patients: EntrepreneurPulse.com.au  Fact Sheet for Healthcare Providers: IncredibleEmployment.be  This test is not yet approved or cleared by the [place] FDA and has been authorized for detection and/or diagnosis of SARS-CoV-2 by FDA under an Emergency Use Authorization (EUA). This EUA will remain in effect (meaning this test can be used) for the duration of the COVID-19 declaration under Section 564(b)(1) of the Act, 21 U.S.C. section 360bbb-3(b)(1), unless the authorization is terminated or revoked.  Performed at [facility], [place] [address], [place], [place] [address]   MRSA Next Gen by PCR, Nasal     Status: None   Collection Time: 09/04/22  4:50 PM   Specimen: Nasal Mucosa; Nasal Swab  Result Value Ref Range Status   MRSA by PCR Next Gen NOT  DETECTED NOT DETECTED Final    Comment: (NOTE) The GeneXpert MRSA Assay (FDA approved for NASAL specimens only), is one component of a comprehensive MRSA colonization surveillance program. It is not intended to diagnose MRSA infection nor to guide or monitor treatment for MRSA infections. Test performance is not FDA approved in patients less than 49 years old. Performed at [facility], [place] [address] [place], [place] [address]      Labs: BNP (last 3 results) Recent Labs    09/03/22 2012  BNP 25.9   Basic Metabolic Panel: Recent Labs  Lab 09/05/22 0454 09/05/22 0921 09/05/22 1824 09/05/22 2221 09/06/22 0447 09/06/22 0842 09/06/22 1801 09/06/22 2248 09/07/22 0241 09/07/22 0728 09/08/22 0413  NA 159*   < > 154*   < >  152*   < > 149* 146* 142 147* 142  K 4.3  --  3.5  --  3.2*  --   --   --  3.4*  --  3.6  CL 125*  --  120*  --  118*  --   --   --  113*  --  113*  CO2 29  --  30  --  30  --   --   --  24  --  25  GLUCOSE 89  --  119*  --  94  --   --   --  82  --  121*  BUN 30*  --  29*  --  23  --   --   --  17  --  13  CREATININE 1.41*  --  1.32*  --  1.23  --   --   --  1.08  --  0.98  CALCIUM 8.4*  --  8.5*  --  8.2*  --   --   --  7.9*  --  8.0*   < > = values in this interval not displayed.   Liver Function Tests: No results for input(s): "AST", "ALT", "ALKPHOS", "BILITOT", "PROT", "ALBUMIN" in the last 168 hours. No results for input(s): "LIPASE", "AMYLASE" in the last 168 hours. No results for input(s): "AMMONIA" in the last 168 hours. CBC: Recent Labs  Lab 09/03/22 2012 09/04/22 0207 09/05/22 0921 09/07/22 1822 09/08/22 0413  WBC 13.3* 13.5* 7.0 11.9* 11.4*  NEUTROABS 10.2*  --  4.5 9.8* 8.9*  HGB 15.7 14.9 12.3* 12.0* 11.6*  HCT 51.8 51.4 41.4 38.3* 36.1*  MCV 95.2 100.0 95.8 91.4 89.6  PLT 143* 113* 96* 115* 110*   Cardiac Enzymes: No results for input(s): "CKTOTAL", "CKMB", "CKMBINDEX", "TROPONINI" in the last 168  hours. BNP: Invalid input(s): "POCBNP" CBG: No results for input(s): "GLUCAP" in the last 168 hours. D-Dimer No results for input(s): "DDIMER" in the last 72 hours. Hgb A1c No results for input(s): "HGBA1C" in the last 72 hours. Lipid Profile No results for input(s): "CHOL", "HDL", "LDLCALC", "TRIG", "CHOLHDL", "LDLDIRECT" in the last 72 hours. Thyroid function studies No results for input(s): "TSH", "T4TOTAL", "T3FREE", "THYROIDAB" in the last 72 hours.  Invalid input(s): "FREET3" Anemia work up No results for input(s): "VITAMINB12", "FOLATE", "FERRITIN", "TIBC", "IRON", "RETICCTPCT" in the last 72 hours. Urinalysis    Component Value Date/Time   COLORURINE STRAW (A) 09/04/2022 [place] 09/04/2022 0353   LABSPEC 1.020 09/04/2022 0353   PHURINE 6.0 09/04/2022 0353   GLUCOSEU NEGATIVE 09/04/2022 0353   GLUCOSEU NEGATIVE 11/12/2017 1110   HGBUR NEGATIVE 09/04/2022 0353   BILIRUBINUR NEGATIVE 09/04/2022 0353   BILIRUBINUR negative 01/21/2017 1438   KETONESUR NEGATIVE 09/04/2022 0353   PROTEINUR NEGATIVE 09/04/2022 0353   UROBILINOGEN 1.0 11/12/2017 1110   NITRITE NEGATIVE 09/04/2022 0353   LEUKOCYTESUR NEGATIVE 09/04/2022 0353   Sepsis Labs Recent Labs  Lab 09/04/22 0207 09/05/22 0921 09/07/22 1822 09/08/22 0413  WBC 13.5* 7.0 11.9* 11.4*   Microbiology Recent Results (from the past 240 hour(s))  Resp panel by RT-PCR (RSV, Flu A&B, Covid) Anterior Nasal Swab     Status: None   Collection Time: 09/03/22  8:40 PM   Specimen: Anterior Nasal Swab  Result Value Ref Range Status   SARS Coronavirus 2 by RT PCR NEGATIVE NEGATIVE Final    Comment: (NOTE) SARS-CoV-2 target nucleic acids are NOT DETECTED.  The SARS-CoV-2 RNA is generally detectable in  upper respiratory specimens during the acute phase of infection. The lowest concentration of SARS-CoV-2 viral copies this assay can detect is 138 copies/mL. A negative result does not preclude  SARS-Cov-2 infection and should not be used as the sole basis for treatment or other patient management decisions. A negative result may occur with  improper specimen collection/handling, submission of specimen other than nasopharyngeal swab, presence of viral mutation(s) within the areas targeted by this assay, and inadequate number of viral copies(<138 copies/mL). A negative result must be combined with clinical observations, patient history, and epidemiological information. The expected result is Negative.  Fact Sheet for Patients:  EntrepreneurPulse.com.au  Fact Sheet for Healthcare Providers:  IncredibleEmployment.be  This test is no t yet approved or cleared by the [place] FDA and  has been authorized for detection and/or diagnosis of SARS-CoV-2 by FDA under an Emergency Use Authorization (EUA). This EUA will remain  in effect (meaning this test can be used) for the duration of the COVID-19 declaration under Section 564(b)(1) of the Act, 21 U.S.C.section 360bbb-3(b)(1), unless the authorization is terminated  or revoked sooner.       Influenza A by PCR NEGATIVE NEGATIVE Final   Influenza B by PCR NEGATIVE NEGATIVE Final    Comment: (NOTE) The Xpert Xpress SARS-CoV-2/FLU/RSV plus assay is intended as an aid in the diagnosis of influenza from Nasopharyngeal swab specimens and should not be used as a sole basis for treatment. Nasal washings and aspirates are unacceptable for Xpert Xpress SARS-CoV-2/FLU/RSV testing.  Fact Sheet for Patients: EntrepreneurPulse.com.au  Fact Sheet for Healthcare Providers: IncredibleEmployment.be  This test is not yet approved or cleared by the [place] FDA and has been authorized for detection and/or diagnosis of SARS-CoV-2 by FDA under an Emergency Use Authorization (EUA). This EUA will remain in effect (meaning this test can be used) for the duration of  the COVID-19 declaration under Section 564(b)(1) of the Act, 21 U.S.C. section 360bbb-3(b)(1), unless the authorization is terminated or revoked.     Resp Syncytial Virus by PCR NEGATIVE NEGATIVE Final    Comment: (NOTE) Fact Sheet for Patients: EntrepreneurPulse.com.au  Fact Sheet for Healthcare Providers: IncredibleEmployment.be  This test is not yet approved or cleared by the [place] FDA and has been authorized for detection and/or diagnosis of SARS-CoV-2 by FDA under an Emergency Use Authorization (EUA). This EUA will remain in effect (meaning this test can be used) for the duration of the COVID-19 declaration under Section 564(b)(1) of the Act, 21 U.S.C. section 360bbb-3(b)(1), unless the authorization is terminated or revoked.  Performed at [facility], [place] [address] [place], [place] [address]   MRSA Next Gen by PCR, Nasal     Status: None   Collection Time: 09/04/22  4:50 PM   Specimen: Nasal Mucosa; Nasal Swab  Result Value Ref Range Status   MRSA by PCR Next Gen NOT DETECTED NOT DETECTED Final    Comment: (NOTE) The GeneXpert MRSA Assay (FDA approved for NASAL specimens only), is one component of a comprehensive MRSA colonization surveillance program. It is not intended to diagnose MRSA infection nor to guide or monitor treatment for MRSA infections. Test performance is not FDA approved in patients less than 90 years old. Performed at [facility], [place] [address] [place], [place] [address]     SIGNED:   Charlynne Cousins, MD  [facility] 09/08/2022, 9:40 AM Pager   If 7PM-7AM, please contact night-coverage [email or web address] Password [email or web address]

## 2022-09-08 NOTE — TOC Transition Note (Signed)
Transition of Care [facility]) - CM/SW Discharge Note  Patient Details  Name: Adam Swanson. MRN: [id number] Date of Birth: Dec 18, 1927  [facility] CM/SW Contact:  Adam Don, LCSW Phone Number: 09/08/2022, 3:35 PM  Clinical Narrative: Patient will return to [facility]. The number for report is [phone]. FL2 completed. Discharge summary, discharge orders, FL2, and SNF transfer report faxed to facility in hub. Medical necessity form done; PTAR scheduled. Discharge packet completed. CSW notified son of discharge. RN updated. TOC signing off.  Final next level of care: Long Term Nursing Home Barriers to Discharge: Barriers Resolved   Patient Goals and CMS Choice CMS Medicare.gov Compare Post Acute Care list provided to:: Patient Represenative (must comment) Choice offered to / list presented to : Adult Children  Discharge Placement                Patient chooses bed at: [facility] Patient to be transferred to facility by: [place] Name of family member notified: Adam Swanson (son) Patient and family notified of of transfer: 09/08/22  Discharge Plan and Services Additional resources added to the After Visit Summary for   In-house Referral: Clinical Social Work   Post Acute Care Choice: [place]          DME Arranged: N/A DME Agency: NA                  Social Determinants of Health ([place]) Interventions [place]: No Food Insecurity (09/04/2022)  Housing: Low Risk  (09/04/2022)  Transportation Needs: No Transportation Needs (09/04/2022)  Utilities: Not At Risk (09/04/2022)  Tobacco Use: Low Risk  (09/03/2022)     Readmission Risk Interventions    09/06/2022   11:56 AM  Readmission Risk Prevention Plan  Transportation Screening Complete  PCP or Specialist Appt within 3-5 Days Not Complete  Not Complete comments Patient will return to [facility] for [place].  [place] or Home Care Consult Complete  Social Work  Consult for [place] Planning/Counseling Complete  Palliative Care Screening Not Applicable  Medication Review Press photographer) Complete

## 2022-09-12 DIAGNOSIS — E870 Hyperosmolality and hypernatremia: Secondary | ICD-10-CM | POA: Diagnosis not present

## 2022-09-12 DIAGNOSIS — F0394 Unspecified dementia, unspecified severity, with anxiety: Secondary | ICD-10-CM | POA: Diagnosis not present

## 2022-09-12 DIAGNOSIS — R627 Adult failure to thrive: Secondary | ICD-10-CM | POA: Diagnosis not present

## 2022-09-14 DIAGNOSIS — N182 Chronic kidney disease, stage 2 (mild): Secondary | ICD-10-CM

## 2022-09-14 DIAGNOSIS — D649 Anemia, unspecified: Secondary | ICD-10-CM

## 2022-09-17 DIAGNOSIS — G301 Alzheimer's disease with late onset: Secondary | ICD-10-CM | POA: Diagnosis not present

## 2022-09-17 DIAGNOSIS — F02B4 Dementia in other diseases classified elsewhere, moderate, with anxiety: Secondary | ICD-10-CM | POA: Diagnosis not present

## 2022-09-17 DIAGNOSIS — G4700 Insomnia, unspecified: Secondary | ICD-10-CM | POA: Diagnosis not present

## 2022-10-03 DIAGNOSIS — I4891 Unspecified atrial fibrillation: Secondary | ICD-10-CM | POA: Diagnosis not present

## 2022-10-03 DIAGNOSIS — E785 Hyperlipidemia, unspecified: Secondary | ICD-10-CM | POA: Diagnosis not present

## 2022-10-03 DIAGNOSIS — S80812A Abrasion, left lower leg, initial encounter: Secondary | ICD-10-CM | POA: Diagnosis not present

## 2022-10-03 DIAGNOSIS — F0390 Unspecified dementia without behavioral disturbance: Secondary | ICD-10-CM | POA: Diagnosis not present

## 2022-10-09 DIAGNOSIS — I4891 Unspecified atrial fibrillation: Secondary | ICD-10-CM | POA: Diagnosis not present

## 2022-10-09 DIAGNOSIS — E785 Hyperlipidemia, unspecified: Secondary | ICD-10-CM | POA: Diagnosis not present

## 2022-10-09 DIAGNOSIS — F0390 Unspecified dementia without behavioral disturbance: Secondary | ICD-10-CM | POA: Diagnosis not present

## 2022-10-09 DIAGNOSIS — I639 Cerebral infarction, unspecified: Secondary | ICD-10-CM | POA: Diagnosis not present

## 2022-10-15 DIAGNOSIS — G4700 Insomnia, unspecified: Secondary | ICD-10-CM | POA: Diagnosis not present

## 2022-10-15 DIAGNOSIS — F02B4 Dementia in other diseases classified elsewhere, moderate, with anxiety: Secondary | ICD-10-CM | POA: Diagnosis not present

## 2022-10-15 DIAGNOSIS — G301 Alzheimer's disease with late onset: Secondary | ICD-10-CM | POA: Diagnosis not present

## 2022-10-16 DIAGNOSIS — F0394 Unspecified dementia, unspecified severity, with anxiety: Secondary | ICD-10-CM

## 2022-10-16 DIAGNOSIS — R634 Abnormal weight loss: Secondary | ICD-10-CM | POA: Diagnosis not present

## 2022-10-16 DIAGNOSIS — Z9181 History of falling: Secondary | ICD-10-CM

## 2022-10-16 DIAGNOSIS — S51812A Laceration without foreign body of left forearm, initial encounter: Secondary | ICD-10-CM | POA: Diagnosis not present

## 2022-10-16 DIAGNOSIS — R2689 Other abnormalities of gait and mobility: Secondary | ICD-10-CM | POA: Diagnosis not present

## 2022-10-16 DIAGNOSIS — M6281 Muscle weakness (generalized): Secondary | ICD-10-CM | POA: Diagnosis not present

## 2022-10-17 DIAGNOSIS — I4891 Unspecified atrial fibrillation: Secondary | ICD-10-CM | POA: Diagnosis not present

## 2022-10-17 DIAGNOSIS — I639 Cerebral infarction, unspecified: Secondary | ICD-10-CM | POA: Diagnosis not present

## 2022-10-17 DIAGNOSIS — F0390 Unspecified dementia without behavioral disturbance: Secondary | ICD-10-CM | POA: Diagnosis not present

## 2022-10-17 DIAGNOSIS — E785 Hyperlipidemia, unspecified: Secondary | ICD-10-CM | POA: Diagnosis not present

## 2022-10-24 DIAGNOSIS — L853 Xerosis cutis: Secondary | ICD-10-CM | POA: Diagnosis not present

## 2022-10-24 DIAGNOSIS — F0394 Unspecified dementia, unspecified severity, with anxiety: Secondary | ICD-10-CM | POA: Diagnosis not present

## 2022-10-24 DIAGNOSIS — L299 Pruritus, unspecified: Secondary | ICD-10-CM | POA: Diagnosis not present

## 2022-11-11 DIAGNOSIS — F0390 Unspecified dementia without behavioral disturbance: Secondary | ICD-10-CM | POA: Diagnosis not present

## 2022-11-11 DIAGNOSIS — R1312 Dysphagia, oropharyngeal phase: Secondary | ICD-10-CM | POA: Diagnosis not present

## 2022-11-11 DIAGNOSIS — I639 Cerebral infarction, unspecified: Secondary | ICD-10-CM | POA: Diagnosis not present

## 2022-11-13 DIAGNOSIS — R1312 Dysphagia, oropharyngeal phase: Secondary | ICD-10-CM | POA: Diagnosis not present

## 2022-11-13 DIAGNOSIS — F0390 Unspecified dementia without behavioral disturbance: Secondary | ICD-10-CM | POA: Diagnosis not present

## 2022-11-13 DIAGNOSIS — I639 Cerebral infarction, unspecified: Secondary | ICD-10-CM | POA: Diagnosis not present

## 2022-11-17 DIAGNOSIS — F0390 Unspecified dementia without behavioral disturbance: Secondary | ICD-10-CM | POA: Diagnosis not present

## 2022-11-17 DIAGNOSIS — I639 Cerebral infarction, unspecified: Secondary | ICD-10-CM | POA: Diagnosis not present

## 2022-11-17 DIAGNOSIS — R1312 Dysphagia, oropharyngeal phase: Secondary | ICD-10-CM | POA: Diagnosis not present

## 2022-11-19 DIAGNOSIS — G301 Alzheimer's disease with late onset: Secondary | ICD-10-CM | POA: Diagnosis not present

## 2022-11-19 DIAGNOSIS — G4700 Insomnia, unspecified: Secondary | ICD-10-CM | POA: Diagnosis not present

## 2022-11-19 DIAGNOSIS — F02B4 Dementia in other diseases classified elsewhere, moderate, with anxiety: Secondary | ICD-10-CM | POA: Diagnosis not present

## 2022-11-21 DIAGNOSIS — Z79899 Other long term (current) drug therapy: Secondary | ICD-10-CM | POA: Diagnosis not present

## 2022-11-22 DIAGNOSIS — F0390 Unspecified dementia without behavioral disturbance: Secondary | ICD-10-CM | POA: Diagnosis not present

## 2022-11-22 DIAGNOSIS — I639 Cerebral infarction, unspecified: Secondary | ICD-10-CM | POA: Diagnosis not present

## 2022-11-22 DIAGNOSIS — R1312 Dysphagia, oropharyngeal phase: Secondary | ICD-10-CM | POA: Diagnosis not present

## 2022-11-23 DIAGNOSIS — F0390 Unspecified dementia without behavioral disturbance: Secondary | ICD-10-CM | POA: Diagnosis not present

## 2022-11-23 DIAGNOSIS — I639 Cerebral infarction, unspecified: Secondary | ICD-10-CM | POA: Diagnosis not present

## 2022-11-23 DIAGNOSIS — R1312 Dysphagia, oropharyngeal phase: Secondary | ICD-10-CM | POA: Diagnosis not present

## 2022-11-24 DIAGNOSIS — R1312 Dysphagia, oropharyngeal phase: Secondary | ICD-10-CM | POA: Diagnosis not present

## 2022-11-24 DIAGNOSIS — I639 Cerebral infarction, unspecified: Secondary | ICD-10-CM | POA: Diagnosis not present

## 2022-11-24 DIAGNOSIS — F0390 Unspecified dementia without behavioral disturbance: Secondary | ICD-10-CM | POA: Diagnosis not present

## 2022-11-29 DIAGNOSIS — R1312 Dysphagia, oropharyngeal phase: Secondary | ICD-10-CM | POA: Diagnosis not present

## 2022-11-29 DIAGNOSIS — F0390 Unspecified dementia without behavioral disturbance: Secondary | ICD-10-CM | POA: Diagnosis not present

## 2022-11-29 DIAGNOSIS — I639 Cerebral infarction, unspecified: Secondary | ICD-10-CM | POA: Diagnosis not present

## 2022-11-30 DIAGNOSIS — I639 Cerebral infarction, unspecified: Secondary | ICD-10-CM | POA: Diagnosis not present

## 2022-11-30 DIAGNOSIS — F0390 Unspecified dementia without behavioral disturbance: Secondary | ICD-10-CM | POA: Diagnosis not present

## 2022-11-30 DIAGNOSIS — R1312 Dysphagia, oropharyngeal phase: Secondary | ICD-10-CM | POA: Diagnosis not present

## 2022-12-01 DIAGNOSIS — I639 Cerebral infarction, unspecified: Secondary | ICD-10-CM | POA: Diagnosis not present

## 2022-12-01 DIAGNOSIS — R1312 Dysphagia, oropharyngeal phase: Secondary | ICD-10-CM | POA: Diagnosis not present

## 2022-12-01 DIAGNOSIS — F0390 Unspecified dementia without behavioral disturbance: Secondary | ICD-10-CM | POA: Diagnosis not present

## 2022-12-05 DIAGNOSIS — F0390 Unspecified dementia without behavioral disturbance: Secondary | ICD-10-CM | POA: Diagnosis not present

## 2022-12-05 DIAGNOSIS — I639 Cerebral infarction, unspecified: Secondary | ICD-10-CM | POA: Diagnosis not present

## 2022-12-05 DIAGNOSIS — R1312 Dysphagia, oropharyngeal phase: Secondary | ICD-10-CM | POA: Diagnosis not present

## 2022-12-06 DIAGNOSIS — I639 Cerebral infarction, unspecified: Secondary | ICD-10-CM | POA: Diagnosis not present

## 2022-12-06 DIAGNOSIS — F0390 Unspecified dementia without behavioral disturbance: Secondary | ICD-10-CM | POA: Diagnosis not present

## 2022-12-06 DIAGNOSIS — R1312 Dysphagia, oropharyngeal phase: Secondary | ICD-10-CM | POA: Diagnosis not present

## 2022-12-08 DIAGNOSIS — I639 Cerebral infarction, unspecified: Secondary | ICD-10-CM | POA: Diagnosis not present

## 2022-12-08 DIAGNOSIS — R1312 Dysphagia, oropharyngeal phase: Secondary | ICD-10-CM | POA: Diagnosis not present

## 2022-12-08 DIAGNOSIS — F0390 Unspecified dementia without behavioral disturbance: Secondary | ICD-10-CM | POA: Diagnosis not present

## 2022-12-12 DIAGNOSIS — I639 Cerebral infarction, unspecified: Secondary | ICD-10-CM | POA: Diagnosis not present

## 2022-12-12 DIAGNOSIS — F0390 Unspecified dementia without behavioral disturbance: Secondary | ICD-10-CM | POA: Diagnosis not present

## 2022-12-12 DIAGNOSIS — R1312 Dysphagia, oropharyngeal phase: Secondary | ICD-10-CM | POA: Diagnosis not present

## 2022-12-14 DIAGNOSIS — I639 Cerebral infarction, unspecified: Secondary | ICD-10-CM | POA: Diagnosis not present

## 2022-12-14 DIAGNOSIS — R1312 Dysphagia, oropharyngeal phase: Secondary | ICD-10-CM | POA: Diagnosis not present

## 2022-12-14 DIAGNOSIS — F0390 Unspecified dementia without behavioral disturbance: Secondary | ICD-10-CM | POA: Diagnosis not present

## 2022-12-19 DIAGNOSIS — F0390 Unspecified dementia without behavioral disturbance: Secondary | ICD-10-CM | POA: Diagnosis not present

## 2022-12-19 DIAGNOSIS — I639 Cerebral infarction, unspecified: Secondary | ICD-10-CM | POA: Diagnosis not present

## 2022-12-19 DIAGNOSIS — R1312 Dysphagia, oropharyngeal phase: Secondary | ICD-10-CM | POA: Diagnosis not present

## 2022-12-20 DIAGNOSIS — F0390 Unspecified dementia without behavioral disturbance: Secondary | ICD-10-CM | POA: Diagnosis not present

## 2022-12-20 DIAGNOSIS — R1312 Dysphagia, oropharyngeal phase: Secondary | ICD-10-CM | POA: Diagnosis not present

## 2022-12-20 DIAGNOSIS — I639 Cerebral infarction, unspecified: Secondary | ICD-10-CM | POA: Diagnosis not present

## 2022-12-24 DIAGNOSIS — G301 Alzheimer's disease with late onset: Secondary | ICD-10-CM | POA: Diagnosis not present

## 2022-12-24 DIAGNOSIS — G4700 Insomnia, unspecified: Secondary | ICD-10-CM | POA: Diagnosis not present

## 2022-12-24 DIAGNOSIS — F064 Anxiety disorder due to known physiological condition: Secondary | ICD-10-CM | POA: Diagnosis not present

## 2022-12-24 DIAGNOSIS — F02B4 Dementia in other diseases classified elsewhere, moderate, with anxiety: Secondary | ICD-10-CM | POA: Diagnosis not present

## 2023-01-18 DIAGNOSIS — G301 Alzheimer's disease with late onset: Secondary | ICD-10-CM | POA: Diagnosis not present

## 2023-01-18 DIAGNOSIS — R569 Unspecified convulsions: Secondary | ICD-10-CM | POA: Diagnosis not present

## 2023-01-21 DIAGNOSIS — G301 Alzheimer's disease with late onset: Secondary | ICD-10-CM | POA: Diagnosis not present

## 2023-01-21 DIAGNOSIS — F02B4 Dementia in other diseases classified elsewhere, moderate, with anxiety: Secondary | ICD-10-CM | POA: Diagnosis not present

## 2023-01-21 DIAGNOSIS — F064 Anxiety disorder due to known physiological condition: Secondary | ICD-10-CM | POA: Diagnosis not present

## 2023-01-21 DIAGNOSIS — G4700 Insomnia, unspecified: Secondary | ICD-10-CM | POA: Diagnosis not present

## 2023-02-09 DIAGNOSIS — S2249XD Multiple fractures of ribs, unspecified side, subsequent encounter for fracture with routine healing: Secondary | ICD-10-CM | POA: Diagnosis not present

## 2023-02-09 DIAGNOSIS — Z862 Personal history of diseases of the blood and blood-forming organs and certain disorders involving the immune mechanism: Secondary | ICD-10-CM | POA: Diagnosis not present

## 2023-02-09 DIAGNOSIS — K449 Diaphragmatic hernia without obstruction or gangrene: Secondary | ICD-10-CM | POA: Diagnosis not present

## 2023-02-09 DIAGNOSIS — E785 Hyperlipidemia, unspecified: Secondary | ICD-10-CM | POA: Diagnosis not present

## 2023-02-09 DIAGNOSIS — Z8774 Personal history of (corrected) congenital malformations of heart and circulatory system: Secondary | ICD-10-CM | POA: Diagnosis not present

## 2023-02-09 DIAGNOSIS — I672 Cerebral atherosclerosis: Secondary | ICD-10-CM | POA: Diagnosis not present

## 2023-02-09 DIAGNOSIS — I69391 Dysphagia following cerebral infarction: Secondary | ICD-10-CM | POA: Diagnosis not present

## 2023-02-09 DIAGNOSIS — N1831 Chronic kidney disease, stage 3a: Secondary | ICD-10-CM | POA: Diagnosis not present

## 2023-02-09 DIAGNOSIS — I480 Paroxysmal atrial fibrillation: Secondary | ICD-10-CM | POA: Diagnosis not present

## 2023-02-09 DIAGNOSIS — R296 Repeated falls: Secondary | ICD-10-CM | POA: Diagnosis not present

## 2023-02-09 DIAGNOSIS — G9340 Encephalopathy, unspecified: Secondary | ICD-10-CM | POA: Diagnosis not present

## 2023-02-09 DIAGNOSIS — D630 Anemia in neoplastic disease: Secondary | ICD-10-CM | POA: Diagnosis not present

## 2023-02-09 DIAGNOSIS — N400 Enlarged prostate without lower urinary tract symptoms: Secondary | ICD-10-CM | POA: Diagnosis not present

## 2023-02-09 DIAGNOSIS — R569 Unspecified convulsions: Secondary | ICD-10-CM | POA: Diagnosis not present

## 2023-02-09 DIAGNOSIS — J939 Pneumothorax, unspecified: Secondary | ICD-10-CM | POA: Diagnosis not present

## 2023-02-09 DIAGNOSIS — K219 Gastro-esophageal reflux disease without esophagitis: Secondary | ICD-10-CM | POA: Diagnosis not present

## 2023-02-09 DIAGNOSIS — F0154 Vascular dementia, unspecified severity, with anxiety: Secondary | ICD-10-CM | POA: Diagnosis not present

## 2023-02-10 DIAGNOSIS — I69391 Dysphagia following cerebral infarction: Secondary | ICD-10-CM | POA: Diagnosis not present

## 2023-02-10 DIAGNOSIS — R296 Repeated falls: Secondary | ICD-10-CM | POA: Diagnosis not present

## 2023-02-10 DIAGNOSIS — G9340 Encephalopathy, unspecified: Secondary | ICD-10-CM | POA: Diagnosis not present

## 2023-02-10 DIAGNOSIS — I672 Cerebral atherosclerosis: Secondary | ICD-10-CM | POA: Diagnosis not present

## 2023-02-10 DIAGNOSIS — F0154 Vascular dementia, unspecified severity, with anxiety: Secondary | ICD-10-CM | POA: Diagnosis not present

## 2023-02-10 DIAGNOSIS — R569 Unspecified convulsions: Secondary | ICD-10-CM | POA: Diagnosis not present

## 2023-02-11 DIAGNOSIS — R569 Unspecified convulsions: Secondary | ICD-10-CM | POA: Diagnosis not present

## 2023-02-11 DIAGNOSIS — I672 Cerebral atherosclerosis: Secondary | ICD-10-CM | POA: Diagnosis not present

## 2023-02-11 DIAGNOSIS — F0154 Vascular dementia, unspecified severity, with anxiety: Secondary | ICD-10-CM | POA: Diagnosis not present

## 2023-02-11 DIAGNOSIS — I69391 Dysphagia following cerebral infarction: Secondary | ICD-10-CM | POA: Diagnosis not present

## 2023-02-11 DIAGNOSIS — G9340 Encephalopathy, unspecified: Secondary | ICD-10-CM | POA: Diagnosis not present

## 2023-02-11 DIAGNOSIS — R296 Repeated falls: Secondary | ICD-10-CM | POA: Diagnosis not present

## 2023-02-13 DIAGNOSIS — G9340 Encephalopathy, unspecified: Secondary | ICD-10-CM | POA: Diagnosis not present

## 2023-02-13 DIAGNOSIS — I672 Cerebral atherosclerosis: Secondary | ICD-10-CM | POA: Diagnosis not present

## 2023-02-13 DIAGNOSIS — R569 Unspecified convulsions: Secondary | ICD-10-CM | POA: Diagnosis not present

## 2023-02-13 DIAGNOSIS — R296 Repeated falls: Secondary | ICD-10-CM | POA: Diagnosis not present

## 2023-02-13 DIAGNOSIS — I69391 Dysphagia following cerebral infarction: Secondary | ICD-10-CM | POA: Diagnosis not present

## 2023-02-13 DIAGNOSIS — F0154 Vascular dementia, unspecified severity, with anxiety: Secondary | ICD-10-CM | POA: Diagnosis not present

## 2023-02-14 DIAGNOSIS — R569 Unspecified convulsions: Secondary | ICD-10-CM | POA: Diagnosis not present

## 2023-02-14 DIAGNOSIS — R296 Repeated falls: Secondary | ICD-10-CM | POA: Diagnosis not present

## 2023-02-14 DIAGNOSIS — G9340 Encephalopathy, unspecified: Secondary | ICD-10-CM | POA: Diagnosis not present

## 2023-02-14 DIAGNOSIS — I672 Cerebral atherosclerosis: Secondary | ICD-10-CM | POA: Diagnosis not present

## 2023-02-14 DIAGNOSIS — I69391 Dysphagia following cerebral infarction: Secondary | ICD-10-CM | POA: Diagnosis not present

## 2023-02-14 DIAGNOSIS — F0154 Vascular dementia, unspecified severity, with anxiety: Secondary | ICD-10-CM | POA: Diagnosis not present

## 2023-02-15 DIAGNOSIS — I69391 Dysphagia following cerebral infarction: Secondary | ICD-10-CM | POA: Diagnosis not present

## 2023-02-15 DIAGNOSIS — I672 Cerebral atherosclerosis: Secondary | ICD-10-CM | POA: Diagnosis not present

## 2023-02-15 DIAGNOSIS — R296 Repeated falls: Secondary | ICD-10-CM | POA: Diagnosis not present

## 2023-02-15 DIAGNOSIS — R569 Unspecified convulsions: Secondary | ICD-10-CM | POA: Diagnosis not present

## 2023-02-15 DIAGNOSIS — G9340 Encephalopathy, unspecified: Secondary | ICD-10-CM | POA: Diagnosis not present

## 2023-02-15 DIAGNOSIS — F0154 Vascular dementia, unspecified severity, with anxiety: Secondary | ICD-10-CM | POA: Diagnosis not present

## 2023-02-20 DIAGNOSIS — R296 Repeated falls: Secondary | ICD-10-CM | POA: Diagnosis not present

## 2023-02-20 DIAGNOSIS — G9340 Encephalopathy, unspecified: Secondary | ICD-10-CM | POA: Diagnosis not present

## 2023-02-20 DIAGNOSIS — R569 Unspecified convulsions: Secondary | ICD-10-CM | POA: Diagnosis not present

## 2023-02-20 DIAGNOSIS — F0154 Vascular dementia, unspecified severity, with anxiety: Secondary | ICD-10-CM | POA: Diagnosis not present

## 2023-02-20 DIAGNOSIS — I672 Cerebral atherosclerosis: Secondary | ICD-10-CM | POA: Diagnosis not present

## 2023-02-20 DIAGNOSIS — I69391 Dysphagia following cerebral infarction: Secondary | ICD-10-CM | POA: Diagnosis not present

## 2023-02-21 ENCOUNTER — Ambulatory Visit: Payer: Medicare Other | Admitting: Neurology

## 2023-02-22 DIAGNOSIS — G9340 Encephalopathy, unspecified: Secondary | ICD-10-CM | POA: Diagnosis not present

## 2023-02-22 DIAGNOSIS — I69391 Dysphagia following cerebral infarction: Secondary | ICD-10-CM | POA: Diagnosis not present

## 2023-02-22 DIAGNOSIS — R569 Unspecified convulsions: Secondary | ICD-10-CM | POA: Diagnosis not present

## 2023-02-22 DIAGNOSIS — F0154 Vascular dementia, unspecified severity, with anxiety: Secondary | ICD-10-CM | POA: Diagnosis not present

## 2023-02-22 DIAGNOSIS — I672 Cerebral atherosclerosis: Secondary | ICD-10-CM | POA: Diagnosis not present

## 2023-02-22 DIAGNOSIS — R296 Repeated falls: Secondary | ICD-10-CM | POA: Diagnosis not present

## 2023-02-27 DIAGNOSIS — G9340 Encephalopathy, unspecified: Secondary | ICD-10-CM | POA: Diagnosis not present

## 2023-02-27 DIAGNOSIS — R296 Repeated falls: Secondary | ICD-10-CM | POA: Diagnosis not present

## 2023-02-27 DIAGNOSIS — I69391 Dysphagia following cerebral infarction: Secondary | ICD-10-CM | POA: Diagnosis not present

## 2023-02-27 DIAGNOSIS — R569 Unspecified convulsions: Secondary | ICD-10-CM | POA: Diagnosis not present

## 2023-02-27 DIAGNOSIS — I672 Cerebral atherosclerosis: Secondary | ICD-10-CM | POA: Diagnosis not present

## 2023-02-27 DIAGNOSIS — F0154 Vascular dementia, unspecified severity, with anxiety: Secondary | ICD-10-CM | POA: Diagnosis not present

## 2023-03-01 DIAGNOSIS — G301 Alzheimer's disease with late onset: Secondary | ICD-10-CM | POA: Diagnosis not present

## 2023-03-01 DIAGNOSIS — G9340 Encephalopathy, unspecified: Secondary | ICD-10-CM | POA: Diagnosis not present

## 2023-03-01 DIAGNOSIS — R569 Unspecified convulsions: Secondary | ICD-10-CM | POA: Diagnosis not present

## 2023-03-01 DIAGNOSIS — F0154 Vascular dementia, unspecified severity, with anxiety: Secondary | ICD-10-CM | POA: Diagnosis not present

## 2023-03-01 DIAGNOSIS — R296 Repeated falls: Secondary | ICD-10-CM | POA: Diagnosis not present

## 2023-03-01 DIAGNOSIS — I615 Nontraumatic intracerebral hemorrhage, intraventricular: Secondary | ICD-10-CM | POA: Diagnosis not present

## 2023-03-01 DIAGNOSIS — I672 Cerebral atherosclerosis: Secondary | ICD-10-CM | POA: Diagnosis not present

## 2023-03-01 DIAGNOSIS — I69391 Dysphagia following cerebral infarction: Secondary | ICD-10-CM | POA: Diagnosis not present

## 2023-03-04 DIAGNOSIS — G4700 Insomnia, unspecified: Secondary | ICD-10-CM | POA: Diagnosis not present

## 2023-03-04 DIAGNOSIS — F02B4 Dementia in other diseases classified elsewhere, moderate, with anxiety: Secondary | ICD-10-CM | POA: Diagnosis not present

## 2023-03-04 DIAGNOSIS — G301 Alzheimer's disease with late onset: Secondary | ICD-10-CM | POA: Diagnosis not present

## 2023-03-04 DIAGNOSIS — F064 Anxiety disorder due to known physiological condition: Secondary | ICD-10-CM | POA: Diagnosis not present

## 2023-03-06 DIAGNOSIS — R569 Unspecified convulsions: Secondary | ICD-10-CM | POA: Diagnosis not present

## 2023-03-06 DIAGNOSIS — I69391 Dysphagia following cerebral infarction: Secondary | ICD-10-CM | POA: Diagnosis not present

## 2023-03-06 DIAGNOSIS — G9340 Encephalopathy, unspecified: Secondary | ICD-10-CM | POA: Diagnosis not present

## 2023-03-06 DIAGNOSIS — F0154 Vascular dementia, unspecified severity, with anxiety: Secondary | ICD-10-CM | POA: Diagnosis not present

## 2023-03-06 DIAGNOSIS — I672 Cerebral atherosclerosis: Secondary | ICD-10-CM | POA: Diagnosis not present

## 2023-03-06 DIAGNOSIS — R296 Repeated falls: Secondary | ICD-10-CM | POA: Diagnosis not present

## 2023-03-07 DIAGNOSIS — R296 Repeated falls: Secondary | ICD-10-CM | POA: Diagnosis not present

## 2023-03-07 DIAGNOSIS — R569 Unspecified convulsions: Secondary | ICD-10-CM | POA: Diagnosis not present

## 2023-03-07 DIAGNOSIS — G9340 Encephalopathy, unspecified: Secondary | ICD-10-CM | POA: Diagnosis not present

## 2023-03-07 DIAGNOSIS — I69391 Dysphagia following cerebral infarction: Secondary | ICD-10-CM | POA: Diagnosis not present

## 2023-03-07 DIAGNOSIS — F0154 Vascular dementia, unspecified severity, with anxiety: Secondary | ICD-10-CM | POA: Diagnosis not present

## 2023-03-07 DIAGNOSIS — I672 Cerebral atherosclerosis: Secondary | ICD-10-CM | POA: Diagnosis not present

## 2023-03-08 DIAGNOSIS — I672 Cerebral atherosclerosis: Secondary | ICD-10-CM | POA: Diagnosis not present

## 2023-03-08 DIAGNOSIS — R569 Unspecified convulsions: Secondary | ICD-10-CM | POA: Diagnosis not present

## 2023-03-08 DIAGNOSIS — I69391 Dysphagia following cerebral infarction: Secondary | ICD-10-CM | POA: Diagnosis not present

## 2023-03-08 DIAGNOSIS — G9340 Encephalopathy, unspecified: Secondary | ICD-10-CM | POA: Diagnosis not present

## 2023-03-08 DIAGNOSIS — F0154 Vascular dementia, unspecified severity, with anxiety: Secondary | ICD-10-CM | POA: Diagnosis not present

## 2023-03-08 DIAGNOSIS — R296 Repeated falls: Secondary | ICD-10-CM | POA: Diagnosis not present

## 2023-03-11 DIAGNOSIS — K449 Diaphragmatic hernia without obstruction or gangrene: Secondary | ICD-10-CM | POA: Diagnosis not present

## 2023-03-11 DIAGNOSIS — K219 Gastro-esophageal reflux disease without esophagitis: Secondary | ICD-10-CM | POA: Diagnosis not present

## 2023-03-11 DIAGNOSIS — S2249XD Multiple fractures of ribs, unspecified side, subsequent encounter for fracture with routine healing: Secondary | ICD-10-CM | POA: Diagnosis not present

## 2023-03-11 DIAGNOSIS — R569 Unspecified convulsions: Secondary | ICD-10-CM | POA: Diagnosis not present

## 2023-03-11 DIAGNOSIS — I480 Paroxysmal atrial fibrillation: Secondary | ICD-10-CM | POA: Diagnosis not present

## 2023-03-11 DIAGNOSIS — N400 Enlarged prostate without lower urinary tract symptoms: Secondary | ICD-10-CM | POA: Diagnosis not present

## 2023-03-11 DIAGNOSIS — I69391 Dysphagia following cerebral infarction: Secondary | ICD-10-CM | POA: Diagnosis not present

## 2023-03-11 DIAGNOSIS — J939 Pneumothorax, unspecified: Secondary | ICD-10-CM | POA: Diagnosis not present

## 2023-03-11 DIAGNOSIS — Z862 Personal history of diseases of the blood and blood-forming organs and certain disorders involving the immune mechanism: Secondary | ICD-10-CM | POA: Diagnosis not present

## 2023-03-11 DIAGNOSIS — Z8774 Personal history of (corrected) congenital malformations of heart and circulatory system: Secondary | ICD-10-CM | POA: Diagnosis not present

## 2023-03-11 DIAGNOSIS — N1831 Chronic kidney disease, stage 3a: Secondary | ICD-10-CM | POA: Diagnosis not present

## 2023-03-11 DIAGNOSIS — E785 Hyperlipidemia, unspecified: Secondary | ICD-10-CM | POA: Diagnosis not present

## 2023-03-11 DIAGNOSIS — G9340 Encephalopathy, unspecified: Secondary | ICD-10-CM | POA: Diagnosis not present

## 2023-03-11 DIAGNOSIS — R296 Repeated falls: Secondary | ICD-10-CM | POA: Diagnosis not present

## 2023-03-11 DIAGNOSIS — I672 Cerebral atherosclerosis: Secondary | ICD-10-CM | POA: Diagnosis not present

## 2023-03-11 DIAGNOSIS — D630 Anemia in neoplastic disease: Secondary | ICD-10-CM | POA: Diagnosis not present

## 2023-03-11 DIAGNOSIS — F0154 Vascular dementia, unspecified severity, with anxiety: Secondary | ICD-10-CM | POA: Diagnosis not present

## 2023-03-12 DIAGNOSIS — G9340 Encephalopathy, unspecified: Secondary | ICD-10-CM | POA: Diagnosis not present

## 2023-03-12 DIAGNOSIS — I672 Cerebral atherosclerosis: Secondary | ICD-10-CM | POA: Diagnosis not present

## 2023-03-12 DIAGNOSIS — R296 Repeated falls: Secondary | ICD-10-CM | POA: Diagnosis not present

## 2023-03-12 DIAGNOSIS — F0154 Vascular dementia, unspecified severity, with anxiety: Secondary | ICD-10-CM | POA: Diagnosis not present

## 2023-03-12 DIAGNOSIS — I69391 Dysphagia following cerebral infarction: Secondary | ICD-10-CM | POA: Diagnosis not present

## 2023-03-12 DIAGNOSIS — R569 Unspecified convulsions: Secondary | ICD-10-CM | POA: Diagnosis not present

## 2023-03-13 DIAGNOSIS — R569 Unspecified convulsions: Secondary | ICD-10-CM | POA: Diagnosis not present

## 2023-03-13 DIAGNOSIS — R296 Repeated falls: Secondary | ICD-10-CM | POA: Diagnosis not present

## 2023-03-13 DIAGNOSIS — F0154 Vascular dementia, unspecified severity, with anxiety: Secondary | ICD-10-CM | POA: Diagnosis not present

## 2023-03-13 DIAGNOSIS — I672 Cerebral atherosclerosis: Secondary | ICD-10-CM | POA: Diagnosis not present

## 2023-03-13 DIAGNOSIS — I69391 Dysphagia following cerebral infarction: Secondary | ICD-10-CM | POA: Diagnosis not present

## 2023-03-13 DIAGNOSIS — G9340 Encephalopathy, unspecified: Secondary | ICD-10-CM | POA: Diagnosis not present

## 2023-03-15 DIAGNOSIS — I672 Cerebral atherosclerosis: Secondary | ICD-10-CM | POA: Diagnosis not present

## 2023-03-15 DIAGNOSIS — R569 Unspecified convulsions: Secondary | ICD-10-CM | POA: Diagnosis not present

## 2023-03-15 DIAGNOSIS — R296 Repeated falls: Secondary | ICD-10-CM | POA: Diagnosis not present

## 2023-03-15 DIAGNOSIS — F0154 Vascular dementia, unspecified severity, with anxiety: Secondary | ICD-10-CM | POA: Diagnosis not present

## 2023-03-15 DIAGNOSIS — G9340 Encephalopathy, unspecified: Secondary | ICD-10-CM | POA: Diagnosis not present

## 2023-03-15 DIAGNOSIS — I69391 Dysphagia following cerebral infarction: Secondary | ICD-10-CM | POA: Diagnosis not present

## 2023-03-18 DIAGNOSIS — G4700 Insomnia, unspecified: Secondary | ICD-10-CM | POA: Diagnosis not present

## 2023-03-19 DIAGNOSIS — I69391 Dysphagia following cerebral infarction: Secondary | ICD-10-CM | POA: Diagnosis not present

## 2023-03-19 DIAGNOSIS — R569 Unspecified convulsions: Secondary | ICD-10-CM | POA: Diagnosis not present

## 2023-03-19 DIAGNOSIS — G9340 Encephalopathy, unspecified: Secondary | ICD-10-CM | POA: Diagnosis not present

## 2023-03-19 DIAGNOSIS — I672 Cerebral atherosclerosis: Secondary | ICD-10-CM | POA: Diagnosis not present

## 2023-03-19 DIAGNOSIS — F0154 Vascular dementia, unspecified severity, with anxiety: Secondary | ICD-10-CM | POA: Diagnosis not present

## 2023-03-19 DIAGNOSIS — R296 Repeated falls: Secondary | ICD-10-CM | POA: Diagnosis not present

## 2023-03-20 DIAGNOSIS — F0154 Vascular dementia, unspecified severity, with anxiety: Secondary | ICD-10-CM | POA: Diagnosis not present

## 2023-03-20 DIAGNOSIS — G9340 Encephalopathy, unspecified: Secondary | ICD-10-CM | POA: Diagnosis not present

## 2023-03-20 DIAGNOSIS — R296 Repeated falls: Secondary | ICD-10-CM | POA: Diagnosis not present

## 2023-03-20 DIAGNOSIS — R569 Unspecified convulsions: Secondary | ICD-10-CM | POA: Diagnosis not present

## 2023-03-20 DIAGNOSIS — I69391 Dysphagia following cerebral infarction: Secondary | ICD-10-CM | POA: Diagnosis not present

## 2023-03-20 DIAGNOSIS — I672 Cerebral atherosclerosis: Secondary | ICD-10-CM | POA: Diagnosis not present

## 2023-03-22 DIAGNOSIS — I69391 Dysphagia following cerebral infarction: Secondary | ICD-10-CM | POA: Diagnosis not present

## 2023-03-22 DIAGNOSIS — R296 Repeated falls: Secondary | ICD-10-CM | POA: Diagnosis not present

## 2023-03-22 DIAGNOSIS — I672 Cerebral atherosclerosis: Secondary | ICD-10-CM | POA: Diagnosis not present

## 2023-03-22 DIAGNOSIS — R569 Unspecified convulsions: Secondary | ICD-10-CM | POA: Diagnosis not present

## 2023-03-22 DIAGNOSIS — G9340 Encephalopathy, unspecified: Secondary | ICD-10-CM | POA: Diagnosis not present

## 2023-03-22 DIAGNOSIS — F0154 Vascular dementia, unspecified severity, with anxiety: Secondary | ICD-10-CM | POA: Diagnosis not present

## 2023-03-26 DIAGNOSIS — R569 Unspecified convulsions: Secondary | ICD-10-CM | POA: Diagnosis not present

## 2023-03-26 DIAGNOSIS — I672 Cerebral atherosclerosis: Secondary | ICD-10-CM | POA: Diagnosis not present

## 2023-03-26 DIAGNOSIS — F0154 Vascular dementia, unspecified severity, with anxiety: Secondary | ICD-10-CM | POA: Diagnosis not present

## 2023-03-26 DIAGNOSIS — R296 Repeated falls: Secondary | ICD-10-CM | POA: Diagnosis not present

## 2023-03-26 DIAGNOSIS — G9340 Encephalopathy, unspecified: Secondary | ICD-10-CM | POA: Diagnosis not present

## 2023-03-26 DIAGNOSIS — I69391 Dysphagia following cerebral infarction: Secondary | ICD-10-CM | POA: Diagnosis not present

## 2023-03-27 DIAGNOSIS — G9340 Encephalopathy, unspecified: Secondary | ICD-10-CM | POA: Diagnosis not present

## 2023-03-27 DIAGNOSIS — R296 Repeated falls: Secondary | ICD-10-CM | POA: Diagnosis not present

## 2023-03-27 DIAGNOSIS — I69391 Dysphagia following cerebral infarction: Secondary | ICD-10-CM | POA: Diagnosis not present

## 2023-03-27 DIAGNOSIS — I672 Cerebral atherosclerosis: Secondary | ICD-10-CM | POA: Diagnosis not present

## 2023-03-27 DIAGNOSIS — F0154 Vascular dementia, unspecified severity, with anxiety: Secondary | ICD-10-CM | POA: Diagnosis not present

## 2023-03-27 DIAGNOSIS — R569 Unspecified convulsions: Secondary | ICD-10-CM | POA: Diagnosis not present

## 2023-03-28 DIAGNOSIS — I672 Cerebral atherosclerosis: Secondary | ICD-10-CM | POA: Diagnosis not present

## 2023-03-28 DIAGNOSIS — F0390 Unspecified dementia without behavioral disturbance: Secondary | ICD-10-CM | POA: Diagnosis not present

## 2023-03-28 DIAGNOSIS — R569 Unspecified convulsions: Secondary | ICD-10-CM | POA: Diagnosis not present

## 2023-03-28 DIAGNOSIS — G301 Alzheimer's disease with late onset: Secondary | ICD-10-CM | POA: Diagnosis not present

## 2023-03-28 DIAGNOSIS — I69391 Dysphagia following cerebral infarction: Secondary | ICD-10-CM | POA: Diagnosis not present

## 2023-03-28 DIAGNOSIS — F0154 Vascular dementia, unspecified severity, with anxiety: Secondary | ICD-10-CM | POA: Diagnosis not present

## 2023-03-28 DIAGNOSIS — R296 Repeated falls: Secondary | ICD-10-CM | POA: Diagnosis not present

## 2023-03-28 DIAGNOSIS — G9340 Encephalopathy, unspecified: Secondary | ICD-10-CM | POA: Diagnosis not present

## 2023-03-29 DIAGNOSIS — G9340 Encephalopathy, unspecified: Secondary | ICD-10-CM | POA: Diagnosis not present

## 2023-03-29 DIAGNOSIS — I69391 Dysphagia following cerebral infarction: Secondary | ICD-10-CM | POA: Diagnosis not present

## 2023-03-29 DIAGNOSIS — R296 Repeated falls: Secondary | ICD-10-CM | POA: Diagnosis not present

## 2023-03-29 DIAGNOSIS — F0154 Vascular dementia, unspecified severity, with anxiety: Secondary | ICD-10-CM | POA: Diagnosis not present

## 2023-03-29 DIAGNOSIS — R569 Unspecified convulsions: Secondary | ICD-10-CM | POA: Diagnosis not present

## 2023-03-29 DIAGNOSIS — I672 Cerebral atherosclerosis: Secondary | ICD-10-CM | POA: Diagnosis not present

## 2023-04-01 DIAGNOSIS — F02B4 Dementia in other diseases classified elsewhere, moderate, with anxiety: Secondary | ICD-10-CM | POA: Diagnosis not present

## 2023-04-01 DIAGNOSIS — G4700 Insomnia, unspecified: Secondary | ICD-10-CM | POA: Diagnosis not present

## 2023-04-01 DIAGNOSIS — G301 Alzheimer's disease with late onset: Secondary | ICD-10-CM | POA: Diagnosis not present

## 2023-04-03 DIAGNOSIS — F0154 Vascular dementia, unspecified severity, with anxiety: Secondary | ICD-10-CM | POA: Diagnosis not present

## 2023-04-03 DIAGNOSIS — R296 Repeated falls: Secondary | ICD-10-CM | POA: Diagnosis not present

## 2023-04-03 DIAGNOSIS — R569 Unspecified convulsions: Secondary | ICD-10-CM | POA: Diagnosis not present

## 2023-04-03 DIAGNOSIS — I69391 Dysphagia following cerebral infarction: Secondary | ICD-10-CM | POA: Diagnosis not present

## 2023-04-03 DIAGNOSIS — G9340 Encephalopathy, unspecified: Secondary | ICD-10-CM | POA: Diagnosis not present

## 2023-04-03 DIAGNOSIS — I672 Cerebral atherosclerosis: Secondary | ICD-10-CM | POA: Diagnosis not present

## 2023-04-04 DIAGNOSIS — I69391 Dysphagia following cerebral infarction: Secondary | ICD-10-CM | POA: Diagnosis not present

## 2023-04-04 DIAGNOSIS — F0154 Vascular dementia, unspecified severity, with anxiety: Secondary | ICD-10-CM | POA: Diagnosis not present

## 2023-04-04 DIAGNOSIS — R296 Repeated falls: Secondary | ICD-10-CM | POA: Diagnosis not present

## 2023-04-04 DIAGNOSIS — G9340 Encephalopathy, unspecified: Secondary | ICD-10-CM | POA: Diagnosis not present

## 2023-04-04 DIAGNOSIS — I672 Cerebral atherosclerosis: Secondary | ICD-10-CM | POA: Diagnosis not present

## 2023-04-04 DIAGNOSIS — R569 Unspecified convulsions: Secondary | ICD-10-CM | POA: Diagnosis not present

## 2023-04-05 DIAGNOSIS — I672 Cerebral atherosclerosis: Secondary | ICD-10-CM | POA: Diagnosis not present

## 2023-04-05 DIAGNOSIS — R296 Repeated falls: Secondary | ICD-10-CM | POA: Diagnosis not present

## 2023-04-05 DIAGNOSIS — R569 Unspecified convulsions: Secondary | ICD-10-CM | POA: Diagnosis not present

## 2023-04-05 DIAGNOSIS — F0154 Vascular dementia, unspecified severity, with anxiety: Secondary | ICD-10-CM | POA: Diagnosis not present

## 2023-04-05 DIAGNOSIS — I69391 Dysphagia following cerebral infarction: Secondary | ICD-10-CM | POA: Diagnosis not present

## 2023-04-05 DIAGNOSIS — G9340 Encephalopathy, unspecified: Secondary | ICD-10-CM | POA: Diagnosis not present

## 2023-04-08 DIAGNOSIS — R569 Unspecified convulsions: Secondary | ICD-10-CM | POA: Diagnosis not present

## 2023-04-08 DIAGNOSIS — I672 Cerebral atherosclerosis: Secondary | ICD-10-CM | POA: Diagnosis not present

## 2023-04-08 DIAGNOSIS — I69391 Dysphagia following cerebral infarction: Secondary | ICD-10-CM | POA: Diagnosis not present

## 2023-04-08 DIAGNOSIS — G9340 Encephalopathy, unspecified: Secondary | ICD-10-CM | POA: Diagnosis not present

## 2023-04-08 DIAGNOSIS — F0154 Vascular dementia, unspecified severity, with anxiety: Secondary | ICD-10-CM | POA: Diagnosis not present

## 2023-04-08 DIAGNOSIS — R296 Repeated falls: Secondary | ICD-10-CM | POA: Diagnosis not present

## 2023-04-10 DIAGNOSIS — I672 Cerebral atherosclerosis: Secondary | ICD-10-CM | POA: Diagnosis not present

## 2023-04-10 DIAGNOSIS — I69391 Dysphagia following cerebral infarction: Secondary | ICD-10-CM | POA: Diagnosis not present

## 2023-04-10 DIAGNOSIS — R296 Repeated falls: Secondary | ICD-10-CM | POA: Diagnosis not present

## 2023-04-10 DIAGNOSIS — R569 Unspecified convulsions: Secondary | ICD-10-CM | POA: Diagnosis not present

## 2023-04-10 DIAGNOSIS — F0154 Vascular dementia, unspecified severity, with anxiety: Secondary | ICD-10-CM | POA: Diagnosis not present

## 2023-04-10 DIAGNOSIS — G9340 Encephalopathy, unspecified: Secondary | ICD-10-CM | POA: Diagnosis not present

## 2023-04-11 DEATH — deceased
